# Patient Record
Sex: Male | Born: 1940 | Race: White | Hispanic: No | Smoking: Former smoker
Health system: Southern US, Community
[De-identification: ages and names within clinical notes are randomized; demographics above are authoritative.]

## PROBLEM LIST (undated history)

## (undated) DIAGNOSIS — J189 Pneumonia, unspecified organism: Secondary | ICD-10-CM

## (undated) DIAGNOSIS — I739 Peripheral vascular disease, unspecified: Secondary | ICD-10-CM

## (undated) DIAGNOSIS — H9190 Unspecified hearing loss, unspecified ear: Secondary | ICD-10-CM

## (undated) DIAGNOSIS — E119 Type 2 diabetes mellitus without complications: Secondary | ICD-10-CM

## (undated) DIAGNOSIS — F419 Anxiety disorder, unspecified: Secondary | ICD-10-CM

## (undated) DIAGNOSIS — K759 Inflammatory liver disease, unspecified: Secondary | ICD-10-CM

## (undated) DIAGNOSIS — K219 Gastro-esophageal reflux disease without esophagitis: Secondary | ICD-10-CM

## (undated) DIAGNOSIS — I10 Essential (primary) hypertension: Secondary | ICD-10-CM

## (undated) DIAGNOSIS — D62 Acute posthemorrhagic anemia: Secondary | ICD-10-CM

## (undated) DIAGNOSIS — M1990 Unspecified osteoarthritis, unspecified site: Secondary | ICD-10-CM

## (undated) DIAGNOSIS — F0390 Unspecified dementia without behavioral disturbance: Secondary | ICD-10-CM

## (undated) DIAGNOSIS — I639 Cerebral infarction, unspecified: Secondary | ICD-10-CM

## (undated) HISTORY — PX: SURGERY SCROTAL / TESTICULAR: SUR1316

## (undated) HISTORY — DX: Peripheral vascular disease, unspecified: I73.9

## (undated) HISTORY — PX: APPENDECTOMY: SHX54

## (undated) HISTORY — DX: Unspecified hearing loss, unspecified ear: H91.90

## (undated) HISTORY — PX: FRACTURE SURGERY: SHX138

## (undated) HISTORY — DX: Inflammatory liver disease, unspecified: K75.9

## (undated) HISTORY — DX: Type 2 diabetes mellitus without complications: E11.9

---

## 1997-12-02 ENCOUNTER — Ambulatory Visit (HOSPITAL_COMMUNITY): Admission: RE | Admit: 1997-12-02 | Discharge: 1997-12-02 | Payer: Self-pay | Admitting: Interventional Cardiology

## 2001-05-02 ENCOUNTER — Encounter: Admission: RE | Admit: 2001-05-02 | Discharge: 2001-05-02 | Payer: Self-pay | Admitting: Family Medicine

## 2001-05-02 ENCOUNTER — Encounter: Payer: Self-pay | Admitting: Family Medicine

## 2002-02-12 ENCOUNTER — Encounter: Payer: Self-pay | Admitting: Family Medicine

## 2002-02-12 ENCOUNTER — Encounter: Admission: RE | Admit: 2002-02-12 | Discharge: 2002-02-12 | Payer: Self-pay | Admitting: Family Medicine

## 2002-02-13 ENCOUNTER — Encounter: Admission: RE | Admit: 2002-02-13 | Discharge: 2002-02-13 | Payer: Self-pay | Admitting: Family Medicine

## 2002-02-13 ENCOUNTER — Encounter: Payer: Self-pay | Admitting: Family Medicine

## 2004-10-22 ENCOUNTER — Emergency Department (HOSPITAL_COMMUNITY): Admission: EM | Admit: 2004-10-22 | Discharge: 2004-10-23 | Payer: Self-pay | Admitting: Emergency Medicine

## 2005-07-07 ENCOUNTER — Encounter: Payer: Self-pay | Admitting: Interventional Cardiology

## 2009-01-23 DIAGNOSIS — K759 Inflammatory liver disease, unspecified: Secondary | ICD-10-CM | POA: Insufficient documentation

## 2009-02-10 DIAGNOSIS — R079 Chest pain, unspecified: Secondary | ICD-10-CM | POA: Insufficient documentation

## 2009-02-10 DIAGNOSIS — R209 Unspecified disturbances of skin sensation: Secondary | ICD-10-CM | POA: Insufficient documentation

## 2009-02-10 HISTORY — DX: Chest pain, unspecified: R07.9

## 2010-08-17 DIAGNOSIS — H53139 Sudden visual loss, unspecified eye: Secondary | ICD-10-CM | POA: Insufficient documentation

## 2012-01-14 DIAGNOSIS — E559 Vitamin D deficiency, unspecified: Secondary | ICD-10-CM | POA: Insufficient documentation

## 2012-07-17 DIAGNOSIS — F17200 Nicotine dependence, unspecified, uncomplicated: Secondary | ICD-10-CM | POA: Insufficient documentation

## 2012-08-23 DIAGNOSIS — H905 Unspecified sensorineural hearing loss: Secondary | ICD-10-CM | POA: Insufficient documentation

## 2013-08-17 DIAGNOSIS — K5520 Angiodysplasia of colon without hemorrhage: Secondary | ICD-10-CM | POA: Insufficient documentation

## 2013-08-17 DIAGNOSIS — I739 Peripheral vascular disease, unspecified: Secondary | ICD-10-CM | POA: Insufficient documentation

## 2013-12-03 DIAGNOSIS — Z1211 Encounter for screening for malignant neoplasm of colon: Secondary | ICD-10-CM | POA: Insufficient documentation

## 2013-12-03 HISTORY — DX: Encounter for screening for malignant neoplasm of colon: Z12.11

## 2015-03-28 DIAGNOSIS — I10 Essential (primary) hypertension: Secondary | ICD-10-CM | POA: Insufficient documentation

## 2015-04-08 DIAGNOSIS — I723 Aneurysm of iliac artery: Secondary | ICD-10-CM | POA: Insufficient documentation

## 2015-04-23 ENCOUNTER — Encounter: Payer: Self-pay | Admitting: Vascular Surgery

## 2015-04-24 DIAGNOSIS — G3184 Mild cognitive impairment, so stated: Secondary | ICD-10-CM | POA: Insufficient documentation

## 2015-04-25 ENCOUNTER — Encounter: Payer: Self-pay | Admitting: Vascular Surgery

## 2015-05-06 ENCOUNTER — Ambulatory Visit (INDEPENDENT_AMBULATORY_CARE_PROVIDER_SITE_OTHER): Payer: Medicare Other | Admitting: Vascular Surgery

## 2015-05-06 ENCOUNTER — Encounter: Payer: Self-pay | Admitting: Vascular Surgery

## 2015-05-06 VITALS — BP 150/76 | HR 66 | Temp 97.2°F | Resp 18 | Ht 72.0 in | Wt 222.0 lb

## 2015-05-06 DIAGNOSIS — I723 Aneurysm of iliac artery: Secondary | ICD-10-CM | POA: Diagnosis not present

## 2015-05-06 NOTE — Addendum Note (Signed)
Addended by: Dorthula Rue L on: 05/06/2015 03:13 PM   Modules accepted: Orders

## 2015-05-06 NOTE — Progress Notes (Signed)
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 He is here today with his wife. He has no history of cardiac disease and no history of prior aneurysmal disease. No family history of aneurysms. On the ultrasound on 04/08/2015 was found to have maximal diameter of his aorta 2.4 cm in maximal diameter of right common iliac artery 1.6 and left common iliac artery of 1.5 cm. He has no symptoms referable to his aneurysm.  Past Medical History  Diagnosis Date  . Diabetes mellitus without complication ([place])   . Hepatitis   . Small vessel disease ([place])   . Hearing loss     Family History  Problem Relation Age of Onset  . Diabetes Mother   . Heart disease Father   . Memory loss Paternal Grandfather     SOCIAL HISTORY: Social History   Social History  . Marital Status: Married    Spouse Name: N/A  . Number of Children: N/A  . Years of Education: N/A   Occupational History  . Not on file.   Social History Main Topics  . Smoking status: Current Every Day Smoker    Types: Pipe  . Smokeless tobacco: Never Used  . Alcohol Use: No  . Drug Use: No  . Sexual Activity: Not on file   Other Topics Concern  . Not on file   Social History Narrative    Allergies  Allergen Reactions  . Sulfa Antibiotics     Other reaction(s): Other (See Comments) States made him crazy    Current Outpatient Prescriptions  Medication Sig Dispense Refill  . B Complex Vitamins (VITAMIN-B COMPLEX) TABS Take by mouth.    . Cholecalciferol (VITAMIN D3) 2000 units capsule Take by mouth.    . Cyanocobalamin (VITAMIN B-12) 2500 MCG SUBL Take by mouth.    . escitalopram (LEXAPRO) 20 MG tablet Take 20 mg by mouth.    . rivastigmine  (EXELON) 4.6 mg/24hr Place onto the skin.    . Vitamin E 400 units TABS Take by mouth.    . folic acid (FOLVITE) A999333 MCG tablet Take by mouth. Reported on 05/06/2015     No current facility-administered medications for this visit.    REVIEW OF SYSTEMS:  [X]  denotes positive finding, [ ]  denotes negative finding Cardiac  Comments:  Chest pain or chest pressure:    Shortness of breath upon exertion:    Short of breath when lying flat:    Irregular heart rhythm:        Vascular    Pain in calf, thigh, or hip brought on by ambulation:    Pain in feet at night that wakes you up from your sleep:     Blood clot in your veins:    Leg swelling:         Pulmonary    Oxygen at home:    Productive cough:     Wheezing:         Neurologic    Sudden weakness in arms or legs:     Sudden numbness in arms or legs:     Sudden onset of difficulty speaking or slurred speech:    Temporary loss of vision in one eye:     Problems with dizziness:  Gastrointestinal    Blood in stool:     Vomited blood:         Genitourinary    Burning when urinating:     Blood in urine:        Psychiatric    Major depression:         Hematologic    Bleeding problems:    Problems with blood clotting too easily:        Skin    Rashes or ulcers:        Constitutional    Fever or chills:      PHYSICAL EXAM: Filed Vitals:   05/06/15 1106 05/06/15 1107  BP: 149/77 150/76  Pulse: 66 66  Temp: 97.2 F (36.2 C)   Resp: 18   Height: 6' (1.829 m)   Weight: 222 lb (100.699 kg)   SpO2: 99%     GENERAL: The patient is a well-nourished male, in no acute distress. The vital signs are documented above. CARDIAC: There is a regular rate and rhythm.  VASCULAR: 2+ radial 2+ femoral 2+ popliteal and 2+ dorsalis pedis pulses bilaterally. No evidence of popliteal artery or femoral artery aneurysms by physical exam. PULMONARY: There is good air exchange bilaterally without wheezing or rales. ABDOMEN: Soft  and non-tender with normal pitched bowel sounds. Obese. I do not feel an aneurysm MUSCULOSKELETAL: There are no major deformities or cyanosis. NEUROLOGIC: No focal weakness or paresthesias are detected. SKIN: There are no ulcers or rashes noted. PSYCHIATRIC: The patient has a normal affect.  DATA:  I reviewed his ultrasound from 04/08/2015 and also reviewed his actual images from a CT scan from 2006. This does show measurements as noted above  MEDICAL ISSUES: I discussed this at length with the patient and his wife present. Explained there is no concern regarding his small iliac artery dilatation bilaterally. Bilateral measurements in 2006 his iliac arteries were approximately 1.2 cm. His aorta at that time was approximately 2.1 cm. Explained that this is a minimal change over 11 years. I would recommend repeat ultrasound in 2 years. If this shows no change would drop back to 3 year intervals for surveillance. They understand this & there are no limitations regarding activity. We'll see him again at that time   Matthew Knight, Sherren Mocha [facility] [employer]: [phone]

## 2015-05-06 NOTE — Progress Notes (Signed)
Filed Vitals:   05/06/15 1106 05/06/15 1107  BP: 149/77 150/76  Pulse: 66 66  Temp: 97.2 F (36.2 C)   Resp: 18   Height: 6' (1.829 m)   Weight: 222 lb (100.699 kg)   SpO2: 99%

## 2015-05-08 ENCOUNTER — Encounter: Payer: Self-pay | Admitting: Family Medicine

## 2017-01-22 DIAGNOSIS — R413 Other amnesia: Secondary | ICD-10-CM | POA: Insufficient documentation

## 2017-03-08 ENCOUNTER — Emergency Department (HOSPITAL_COMMUNITY): Payer: Medicare Other

## 2017-03-08 ENCOUNTER — Emergency Department (HOSPITAL_COMMUNITY)
Admission: EM | Admit: 2017-03-08 | Discharge: 2017-03-08 | Disposition: A | Discharge status: Home / Self Care | Payer: Medicare Other | Attending: Emergency Medicine | Admitting: Emergency Medicine

## 2017-03-08 ENCOUNTER — Encounter (HOSPITAL_COMMUNITY): Payer: Self-pay | Admitting: Family Medicine

## 2017-03-08 DIAGNOSIS — W0110XA Fall on same level from slipping, tripping and stumbling with subsequent striking against unspecified object, initial encounter: Secondary | ICD-10-CM | POA: Diagnosis not present

## 2017-03-08 DIAGNOSIS — E119 Type 2 diabetes mellitus without complications: Secondary | ICD-10-CM | POA: Diagnosis not present

## 2017-03-08 DIAGNOSIS — Y929 Unspecified place or not applicable: Secondary | ICD-10-CM | POA: Insufficient documentation

## 2017-03-08 DIAGNOSIS — Y998 Other external cause status: Secondary | ICD-10-CM | POA: Diagnosis not present

## 2017-03-08 DIAGNOSIS — S4991XA Unspecified injury of right shoulder and upper arm, initial encounter: Secondary | ICD-10-CM | POA: Diagnosis present

## 2017-03-08 DIAGNOSIS — F17290 Nicotine dependence, other tobacco product, uncomplicated: Secondary | ICD-10-CM | POA: Diagnosis not present

## 2017-03-08 DIAGNOSIS — Z79899 Other long term (current) drug therapy: Secondary | ICD-10-CM | POA: Diagnosis not present

## 2017-03-08 DIAGNOSIS — S42201A Unspecified fracture of upper end of right humerus, initial encounter for closed fracture: Secondary | ICD-10-CM | POA: Diagnosis not present

## 2017-03-08 DIAGNOSIS — Y93K1 Activity, walking an animal: Secondary | ICD-10-CM | POA: Diagnosis not present

## 2017-03-08 MED ORDER — HYDROCODONE-ACETAMINOPHEN 5-325 MG PO TABS
1.0000 | ORAL_TABLET | Freq: Once | ORAL | Status: AC
Start: 2017-03-08 — End: 2017-03-08
  Administered 2017-03-08: 1 via ORAL
  Filled 2017-03-08: qty 1

## 2017-03-08 MED ORDER — HYDROCODONE-ACETAMINOPHEN 5-325 MG PO TABS: 1.0000 | ORAL_TABLET | ORAL | 0 refills | Status: DC | PRN

## 2017-03-08 MED ORDER — NAPROXEN 375 MG PO TABS: 375.0000 mg | ORAL_TABLET | Freq: Two times a day (BID) | ORAL | 0 refills | Status: DC

## 2017-03-08 MED ORDER — NAPROXEN 500 MG PO TABS
500.0000 mg | ORAL_TABLET | Freq: Once | ORAL | Status: AC
  Administered 2017-03-08: 500 mg via ORAL
  Filled 2017-03-08: qty 1

## 2017-03-08 MED ORDER — DOCUSATE SODIUM 100 MG PO CAPS: 100.0000 mg | ORAL_CAPSULE | Freq: Two times a day (BID) | ORAL | 0 refills | Status: DC

## 2017-03-08 NOTE — ED Provider Notes (Signed)
[facility] DEPT Provider Note   CSN: [id number] Arrival date & time: 03/08/17  1723     History   Chief Complaint Chief Complaint  Patient presents with  . Shoulder Injury  . Fall    HPI Jaquae TSUNEO FAISON is a 77 y.o. male.  HPI Patient was walking his dog when he lost his balance and fell landing on his right shoulder.  He denies he struck his head or  loss of consciousness.  No headache, no neck pain, no chest pain or abdominal pain.  Patient denies hip or lower extremity pain.  Pain is localized to the shoulder where there is deformity patient denies any anticoagulants. Past Medical History:  Diagnosis Date  . Diabetes mellitus without complication ([place])   . Hearing loss   . Hepatitis   . Small vessel disease     There are no active problems to display for this patient.   Past Surgical History:  Procedure Laterality Date  . APPENDECTOMY         Home Medications    Prior to Admission medications   Medication Sig Start Date End Date Taking? Authorizing Provider  B Complex Vitamins (VITAMIN-B COMPLEX) TABS Take by mouth.    [provider]  Cholecalciferol (VITAMIN D3) 2000 units capsule Take by mouth.    [provider]  Cyanocobalamin (VITAMIN B-12) 2500 MCG SUBL Take by mouth.    [provider]  docusate sodium (COLACE) 100 MG capsule Take 1 capsule (100 mg total) by mouth every 12 (twelve) hours. Use to prevent constipation while taking Vicodin. 03/08/17   Charlesetta Shanks, MD  escitalopram (LEXAPRO) 20 MG tablet Take 20 mg by mouth. 03/14/15   [provider]  folic acid (FOLVITE) 628 MCG tablet Take by mouth. Reported on 05/06/2015    [provider]  HYDROcodone-acetaminophen (NORCO/VICODIN) 5-325 MG tablet Take 1-2 tablets by mouth every 4 (four) hours as needed for moderate pain or severe pain. 03/08/17   Charlesetta Shanks, MD  naproxen (NAPROSYN) 375 MG tablet Take 1 tablet (375 mg  total) by mouth 2 (two) times daily. 03/08/17   Charlesetta Shanks, MD  rivastigmine (EXELON) 4.6 mg/24hr Place onto the skin. 10/14/14   [provider]  Vitamin E 400 units TABS Take by mouth. 02/16/13   [provider]    Family History Family History  Problem Relation Age of Onset  . Diabetes Mother   . Heart disease Father   . Memory loss Paternal Grandfather     Social History Social History   Tobacco Use  . Smoking status: Current Every Day Smoker    Types: Pipe  . Smokeless tobacco: Never Used  Substance Use Topics  . Alcohol use: No    Alcohol/week: 0.0 oz  . Drug use: No     Allergies   Sulfa antibiotics   Review of Systems Review of Systems 10 Systems reviewed and are negative for acute change except as noted in the HPI.   Physical Exam Updated Vital Signs BP (!) 184/73 (BP Location: Left Arm)   Pulse (!) 51   Temp 97.8 F (36.6 C) (Oral)   Resp 18   Ht 6' (1.829 m)   Wt 99.8 kg (220 lb)   SpO2 93%   BMI 29.84 kg/m   Physical Exam  Constitutional: He appears well-developed and well-nourished.  HENT:  Head: Normocephalic and atraumatic.  Eyes: Conjunctivae and EOM are normal. Pupils are equal, round, and reactive to light.  Neck: Neck supple.  Cardiovascular: Normal rate and regular rhythm.  No murmur heard. Pulmonary/Chest: Effort normal and breath sounds normal. No respiratory distress. He exhibits no tenderness.  Abdominal: Soft. He exhibits no distension. There is no tenderness.  Musculoskeletal: He exhibits no edema.  Severe pain with any range of motion of the right upper extremity.  Patient is neurovascularly intact.  Swelling over lateral right shoulder. Other extremities normal range of motion without pain or deformity.  Neurological: He is alert. No cranial nerve deficit. He exhibits normal muscle tone. Coordination normal.  Skin: Skin is warm and dry.  Psychiatric: He has a normal mood and affect.  Nursing note and  vitals reviewed.    ED Treatments / Results  Labs (all labs ordered are listed, but only abnormal results are displayed) Labs Reviewed - No data to display  EKG  EKG Interpretation None       Radiology Dg Shoulder Right  Result Date: 03/08/2017 CLINICAL DATA:  Right shoulder pain after fall. EXAM: RIGHT SHOULDER - 2+ VIEW COMPARISON:  None. FINDINGS: An acute, closed surgical neck fracture of the right humerus is noted with 8 mm of medial displacement of the humeral shaft relative to the humeral head. No joint dislocation is seen at the glenohumeral nor AC articulations. Lucencies involving the humeral head undermining the greater tuberosity cannot exclude the possibility a nondisplaced fracture but this is not conclusive based on the two views acquired. There is osteoarthritic spurring and joint space narrowing of the AC joint. IMPRESSION: 1. Acute, closed, 8 mm medially displaced fracture of the surgical neck of the humerus with equivocal fracture involving the humeral head. 2. No joint dislocations. 3. AC joint osteoarthritis. Electronically Signed   By: Ashley Royalty M.D.   On: 03/08/2017 18:16    Procedures Procedures (including critical care time)  Medications Ordered in ED Medications  HYDROcodone-acetaminophen (NORCO/VICODIN) 5-325 MG per tablet 1 tablet (not administered)  naproxen (NAPROSYN) tablet 500 mg (not administered)     Initial Impression / Assessment and Plan / ED Course  I have reviewed the triage vital signs and the nursing notes.  Pertinent labs & imaging results that were available during my care of the patient were reviewed by me and considered in my medical decision making (see chart for details).      Final Clinical Impressions(s) / ED Diagnoses   Final diagnoses:  Closed fracture of proximal end of right humerus, unspecified fracture morphology, initial encounter   Patient on mechanical fall.  He is with family members who are available to assist  at home.  Patient will follow up with orthopedics for humerus fracture.  No other evident injury.  Patient is otherwise at baseline.  Use of naproxen, Vicodin and Colace reviewed with patient and family members. ED Discharge Orders        Ordered    naproxen (NAPROSYN) 375 MG tablet  2 times daily     03/08/17 1929    HYDROcodone-acetaminophen (NORCO/VICODIN) 5-325 MG tablet  Every 4 hours PRN     03/08/17 1929    docusate sodium (COLACE) 100 MG capsule  Every 12 hours     03/08/17 1929       Charlesetta Shanks, MD 03/08/17 1932

## 2017-03-08 NOTE — ED Triage Notes (Signed)
Patient reports he fell in the drive way while attempting to get the dog in the house. He is unsure how he fell but reports he was not dizzy prior to falling. He is complaining of right shoulder pain and denies hitting his head.

## 2017-03-08 NOTE — ED Notes (Signed)
Pt ambulatory and independent at discharge.  Verbalized understanding of discharge instructions 

## 2017-04-19 DIAGNOSIS — B351 Tinea unguium: Secondary | ICD-10-CM | POA: Insufficient documentation

## 2017-05-03 ENCOUNTER — Ambulatory Visit (INDEPENDENT_AMBULATORY_CARE_PROVIDER_SITE_OTHER): Payer: Medicare Other | Admitting: Podiatry

## 2017-05-03 ENCOUNTER — Encounter: Payer: Self-pay | Admitting: Podiatry

## 2017-05-03 DIAGNOSIS — M79675 Pain in left toe(s): Secondary | ICD-10-CM

## 2017-05-03 DIAGNOSIS — B351 Tinea unguium: Secondary | ICD-10-CM

## 2017-05-03 DIAGNOSIS — M79674 Pain in right toe(s): Secondary | ICD-10-CM

## 2017-05-05 NOTE — Progress Notes (Signed)
Subjective:   Patient ID: Matthew Knight, male   DOB: 77 y.o.   MRN: [id number]   HPI 77 year old male presents the office today for concerns of thick, painful, elongated toenails that he could not trim himself.  He previously had a right hallux toenail removed.  He denies any redness or drainage or swelling to the toenail sites.  He denies any recent injury no other swelling or areas of pain to his feet or any other concerns.  He states he is only here because his wife made him come.    Review of Systems  All other systems reviewed and are negative.       Objective:  Physical Exam  General: AAO x3, NAD  Dermatological: Nails are hypertrophic, dystrophic, brittle, discolored, elongated 10. No surrounding redness or drainage. Tenderness nails 1-5 bilaterally. No open lesions or pre-ulcerative lesions are identified today.  Vascular: Dorsalis Pedis artery and Posterior Tibial artery pedal pulses are 2/4 bilateral with immedate capillary fill time. There is no pain with calf compression, swelling, warmth, erythema.   Neruologic: Grossly intact via light touch bilateral. Protective threshold with Semmes Wienstein monofilament intact to all pedal sites bilateral.   Musculoskeletal: No gross boney pedal deformities bilateral. No pain, crepitus, or limitation noted with foot and ankle range of motion bilateral. Muscular strength 5/5 in all groups tested bilateral.  Gait: Unassisted, Nonantalgic.       Assessment:   Symptomatic onychomycosis    Plan:  -Treatment options discussed including all alternatives, risks, and complications -Etiology of symptoms were discussed -Nails debrided 10 without complications or bleeding. -Daily foot inspection -Follow-up in 3 months or sooner if any problems arise. In the meantime, encouraged to call the office with any questions, concerns, change in symptoms.   Celesta Gentile, DPM

## 2017-05-10 ENCOUNTER — Ambulatory Visit: Payer: Medicare Other | Admitting: Vascular Surgery

## 2017-05-10 ENCOUNTER — Encounter (HOSPITAL_COMMUNITY): Payer: Medicare Other

## 2017-07-19 ENCOUNTER — Encounter: Payer: Self-pay | Admitting: Vascular Surgery

## 2017-07-19 ENCOUNTER — Other Ambulatory Visit: Payer: Self-pay

## 2017-07-19 ENCOUNTER — Ambulatory Visit (HOSPITAL_COMMUNITY)
Admission: RE | Admit: 2017-07-19 | Discharge: 2017-07-19 | Disposition: A | Discharge status: Home / Self Care | Payer: Medicare Other | Source: Ambulatory Visit | Attending: Vascular Surgery | Admitting: Vascular Surgery

## 2017-07-19 ENCOUNTER — Ambulatory Visit (INDEPENDENT_AMBULATORY_CARE_PROVIDER_SITE_OTHER): Payer: Medicare Other | Admitting: Vascular Surgery

## 2017-07-19 VITALS — BP 163/75 | HR 135 | Temp 97.2°F | Resp 16 | Ht 72.0 in | Wt 218.0 lb

## 2017-07-19 DIAGNOSIS — I723 Aneurysm of iliac artery: Secondary | ICD-10-CM

## 2017-07-19 NOTE — Progress Notes (Signed)
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  HPI: Matthew Knight is a 77 y.o. male here today for follow-up.  He had had prior imaging studies revealing small iliac artery aneurysms.  He had ultrasound 2 years ago and is here today for serial follow-up.  He is here today with his wife.  He has no new medical problems and specifically denies any symptoms related to his aneurysm  Past Medical History:  Diagnosis Date  . Diabetes mellitus without complication ([place])   . Hearing loss   . Hepatitis   . Small vessel disease ([place])     Family History  Problem Relation Age of Onset  . Diabetes Mother   . Heart disease Father   . Memory loss Paternal Grandfather     SOCIAL HISTORY: Social History   Tobacco Use  . Smoking status: Current Every Day Smoker    Types: Pipe  . Smokeless tobacco: Never Used  Substance Use Topics  . Alcohol use: No    Alcohol/week: 0.0 oz    Allergies  Allergen Reactions  . Sulfa Antibiotics     Other reaction(s): Other (See Comments) States made him crazy    Current Outpatient Medications  Medication Sig Dispense Refill  . B Complex Vitamins (VITAMIN-B COMPLEX) TABS Take by mouth.    . Cholecalciferol (VITAMIN D3) 2000 units capsule Take by mouth.    . Cyanocobalamin (VITAMIN B-12) 2500 MCG SUBL Take by mouth.    . escitalopram (LEXAPRO) 20 MG tablet Take 20 mg by mouth.    . folic acid (FOLVITE) 644 MCG tablet Take by mouth. Reported on 05/06/2015    . naproxen (NAPROSYN) 375 MG tablet Take 1 tablet (375 mg total) by mouth 2 (two) times daily. 20 tablet 0  . rivastigmine (EXELON) 4.6 mg/24hr Place onto the skin.    . Vitamin E 400 units TABS Take by mouth.     No current facility-administered medications for this visit.     REVIEW OF SYSTEMS:    [X]  denotes positive finding, [ ]  denotes negative finding Cardiac  Comments:  Chest pain or chest pressure:    Shortness of breath upon exertion:    Short of breath when lying flat:    Irregular heart rhythm:        Vascular    Pain in calf, thigh, or hip brought on by ambulation:    Pain in feet at night that wakes you up from your sleep:     Blood clot in your veins:    Leg swelling:           PHYSICAL EXAM: Vitals:   07/19/17 1006  BP: (!) 163/75  Pulse: (!) 135  Resp: 16  Temp: (!) 97.2 F (36.2 C)  TempSrc: Oral  SpO2: 97%  Weight: 218 lb (98.9 kg)  Height: 6' (1.829 m)    GENERAL: The patient is a well-nourished male, in no acute distress. The vital signs are documented above. CARDIOVASCULAR: 2+ radial pulses.  He does have moderate obesity and I do not palpate aneurysms. PULMONARY: There is good air exchange  MUSCULOSKELETAL: There are no major deformities or cyanosis. NEUROLOGIC: No focal weakness or paresthesias are detected. SKIN: There are no ulcers or rashes noted. PSYCHIATRIC: The patient has a normal affect.  DATA:  Duplex today reveals maximal diameter of his iliac arteries approximately 1.5  cm bilaterally  MEDICAL ISSUES: I discussed these findings with the patient and his wife.  He does have very small dilatation of his iliac arteries bilaterally.  Have recommended follow-up in 3 years.  If he has no change in his dilatation at that time would discontinue follow-up.    Rosetta Posner, MD FACS [facility] Tel [phone] Pager [phone]

## 2017-08-05 ENCOUNTER — Ambulatory Visit (INDEPENDENT_AMBULATORY_CARE_PROVIDER_SITE_OTHER): Payer: Medicare Other | Admitting: Podiatry

## 2017-08-05 ENCOUNTER — Encounter: Payer: Self-pay | Admitting: Podiatry

## 2017-08-05 ENCOUNTER — Other Ambulatory Visit: Payer: Self-pay

## 2017-08-05 DIAGNOSIS — B351 Tinea unguium: Secondary | ICD-10-CM | POA: Diagnosis not present

## 2017-08-05 DIAGNOSIS — M79675 Pain in left toe(s): Secondary | ICD-10-CM

## 2017-08-05 DIAGNOSIS — M79674 Pain in right toe(s): Secondary | ICD-10-CM

## 2017-08-07 NOTE — Progress Notes (Signed)
HPI Mr. Urwin  presents with his wife today for follow  of thick, painful, elongated toenails that he could not trim himself.  He previously had a right hallux toenail removed and that has healed well.  He denies any redness or drainage or swelling to the toenail sites.  He denies any recent injury no other swelling or areas of pain to his feet or any other concerns.   Wife states he has memory loss and it is being managed.  Review of Systems  All other systems reviewed and are negative.  Physical Exam  General: AAO x3, NAD  Pedal Neurovascular examination unchanged from last visit.  Dermatological: Nails are hypertrophic, painful,  dystrophic, brittle, discolored, elongated 9.  Evidence of recent total nail avulsion right great toe. Nailbed completely epithelialized. No erythema, no edema, no drainage. No open lesions or pre-ulcerative lesions are identified today.  Musculoskeletal: No gross bony pedal deformities bilateral.  Muscular strength 5/5 in all groups tested bilateral.  Gait: Unassisted, Nonantalgic.   Assessment:  Painful onychomycosis of toenails x 9 Recent nail avulsion site right great toe completely healed  Plan: -Nails debrided 9 without complications or iatrogenic bleeding -Daily foot inspection -Follow-up in 3 months or sooner if any problems arise. Wife to call the office should any concerns arise in the interim.

## 2017-08-12 ENCOUNTER — Encounter: Payer: Self-pay | Admitting: Podiatry

## 2017-11-10 ENCOUNTER — Encounter (HOSPITAL_COMMUNITY): Payer: Self-pay | Admitting: *Deleted

## 2017-11-10 ENCOUNTER — Other Ambulatory Visit: Payer: Self-pay

## 2017-11-10 ENCOUNTER — Observation Stay (HOSPITAL_COMMUNITY)
Admission: EM | Admit: 2017-11-10 | Discharge: 2017-11-11 | Disposition: A | Discharge status: Home / Self Care | Payer: Medicare Other | Attending: Family Medicine | Admitting: Family Medicine

## 2017-11-10 ENCOUNTER — Emergency Department (HOSPITAL_COMMUNITY): Payer: Medicare Other

## 2017-11-10 DIAGNOSIS — R55 Syncope and collapse: Secondary | ICD-10-CM | POA: Diagnosis not present

## 2017-11-10 DIAGNOSIS — E119 Type 2 diabetes mellitus without complications: Secondary | ICD-10-CM | POA: Insufficient documentation

## 2017-11-10 DIAGNOSIS — Z79899 Other long term (current) drug therapy: Secondary | ICD-10-CM | POA: Diagnosis not present

## 2017-11-10 DIAGNOSIS — Z9281 Personal history of extracorporeal membrane oxygenation (ECMO): Secondary | ICD-10-CM | POA: Insufficient documentation

## 2017-11-10 DIAGNOSIS — R001 Bradycardia, unspecified: Secondary | ICD-10-CM | POA: Insufficient documentation

## 2017-11-10 DIAGNOSIS — I10 Essential (primary) hypertension: Secondary | ICD-10-CM | POA: Diagnosis not present

## 2017-11-10 DIAGNOSIS — H409 Unspecified glaucoma: Secondary | ICD-10-CM | POA: Insufficient documentation

## 2017-11-10 DIAGNOSIS — F17200 Nicotine dependence, unspecified, uncomplicated: Secondary | ICD-10-CM | POA: Insufficient documentation

## 2017-11-10 DIAGNOSIS — Z7982 Long term (current) use of aspirin: Secondary | ICD-10-CM | POA: Diagnosis not present

## 2017-11-10 DIAGNOSIS — R2681 Unsteadiness on feet: Secondary | ICD-10-CM | POA: Insufficient documentation

## 2017-11-10 DIAGNOSIS — F0390 Unspecified dementia without behavioral disturbance: Secondary | ICD-10-CM | POA: Insufficient documentation

## 2017-11-10 LAB — CBC WITH DIFFERENTIAL/PLATELET
ABS IMMATURE GRANULOCYTES: 0.1 10*3/uL (ref 0.0–0.1)
Basophils Absolute: 0.1 10*3/uL (ref 0.0–0.1)
Basophils Relative: 1 %
EOS PCT: 1 %
Eosinophils Absolute: 0.2 10*3/uL (ref 0.0–0.7)
HCT: 50.8 % (ref 39.0–52.0)
HEMOGLOBIN: 16.7 g/dL (ref 13.0–17.0)
Immature Granulocytes: 1 %
LYMPHS ABS: 1.9 10*3/uL (ref 0.7–4.0)
LYMPHS PCT: 14 %
MCH: 29.9 pg (ref 26.0–34.0)
MCHC: 32.9 g/dL (ref 30.0–36.0)
MCV: 91 fL (ref 78.0–100.0)
MONO ABS: 0.7 10*3/uL (ref 0.1–1.0)
MONOS PCT: 5 %
NEUTROS ABS: 11 10*3/uL — AB (ref 1.7–7.7)
Neutrophils Relative %: 78 %
Platelets: 336 10*3/uL (ref 150–400)
RBC: 5.58 MIL/uL (ref 4.22–5.81)
RDW: 12.3 % (ref 11.5–15.5)
WBC: 13.9 10*3/uL — ABNORMAL HIGH (ref 4.0–10.5)

## 2017-11-10 LAB — URINALYSIS, ROUTINE W REFLEX MICROSCOPIC
Bilirubin Urine: NEGATIVE
GLUCOSE, UA: NEGATIVE mg/dL
Hgb urine dipstick: NEGATIVE
Ketones, ur: NEGATIVE mg/dL
Leukocytes, UA: NEGATIVE
NITRITE: NEGATIVE
PROTEIN: NEGATIVE mg/dL
SPECIFIC GRAVITY, URINE: 1.023 (ref 1.005–1.030)
pH: 7 (ref 5.0–8.0)

## 2017-11-10 LAB — COMPREHENSIVE METABOLIC PANEL
ALK PHOS: 73 U/L (ref 38–126)
ALT: 36 U/L (ref 0–44)
ANION GAP: 11 (ref 5–15)
AST: 41 U/L (ref 15–41)
Albumin: 3.8 g/dL (ref 3.5–5.0)
BUN: 16 mg/dL (ref 8–23)
CALCIUM: 9.1 mg/dL (ref 8.9–10.3)
CO2: 25 mmol/L (ref 22–32)
CREATININE: 1.09 mg/dL (ref 0.61–1.24)
Chloride: 106 mmol/L (ref 98–111)
GFR calc non Af Amer: >60 mL/min (ref >60)
Glucose, Bld: 110 mg/dL — ABNORMAL HIGH (ref 70–99)
Potassium: 4.4 mmol/L (ref 3.5–5.1)
SODIUM: 142 mmol/L (ref 135–145)
TOTAL PROTEIN: 6.5 g/dL (ref 6.5–8.1)
Total Bilirubin: 1 mg/dL (ref 0.3–1.2)

## 2017-11-10 LAB — TROPONIN I: Troponin I: <0.03 ng/mL (ref <0.03)

## 2017-11-10 MED ORDER — ACETAMINOPHEN 325 MG PO TABS: 650.0000 mg | ORAL_TABLET | Freq: Four times a day (QID) | ORAL | Status: DC | PRN

## 2017-11-10 MED ORDER — INSULIN ASPART 100 UNIT/ML ~~LOC~~ SOLN: 0.0000 [IU] | Freq: Three times a day (TID) | SUBCUTANEOUS | Status: DC

## 2017-11-10 MED ORDER — SODIUM CHLORIDE 0.9 % IV SOLN
INTRAVENOUS | Status: AC
  Administered 2017-11-11: 01:00:00 via INTRAVENOUS

## 2017-11-10 MED ORDER — INSULIN ASPART 100 UNIT/ML ~~LOC~~ SOLN: 0.0000 [IU] | Freq: Every day | SUBCUTANEOUS | Status: DC

## 2017-11-10 MED ORDER — ENOXAPARIN SODIUM 40 MG/0.4ML ~~LOC~~ SOLN: 40.0000 mg | SUBCUTANEOUS | Status: DC

## 2017-11-10 MED ORDER — ACETAMINOPHEN 650 MG RE SUPP: 650.0000 mg | Freq: Four times a day (QID) | RECTAL | Status: DC | PRN

## 2017-11-10 NOTE — ED Provider Notes (Signed)
Patient care was taken over from Dr. Lita Mains.  He is a 77 year old who had a syncopal episode.  He has a history of dementia but is at his baseline mental status currently.  He has no focal neurologic deficits.  He was noted to be bradycardic and is still bradycardic in the 50s.  He was initially in the 2s.  He is maintaining normal blood pressures.  His imaging studies do not show any evidence of facial fractures.  There was some questionable abnormalities in his thalamus.  Radiologist was requesting MRI.  MRI was performed which showed no evidence of acute abnormalities.  Given his ongoing bradycardia, I consulted with Dr. Maudie Mercury who will admit the patient for observation.   Matthew Johns, MD 11/10/17 [phone]

## 2017-11-10 NOTE — ED Notes (Signed)
Had moderate vomit x1.

## 2017-11-10 NOTE — ED Provider Notes (Signed)
[facility] Provider Note   CSN: [id number] Arrival date & time: 11/10/17  1333     History   Chief Complaint Chief Complaint  Patient presents with  . Fall    HPI Matthew Knight is a 76 y.o. male.  HPI Patient is a poor historian.  Has early onset dementia.  Per wife patient was looking unsteady while he was walking at lunch today.  She took a trip out of his hands and saw him fall face forward and hit the floor.  Had a loss of consciousness of roughly 1 minute.  No seizure-like activity.  Noted to be bleeding from his mouth nose.  Patient does not remember the fall.  Currently denying headache or neck pain.  No focal weakness or numbness.  No recent vomiting or diarrhea. Past Medical History:  Diagnosis Date  . Diabetes mellitus without complication ([place])   . Hearing loss   . Hepatitis   . Small vessel disease [facility])     Patient Active Problem List   Diagnosis Date Noted  . Hypertension 11/11/2017  . Bradycardia 11/11/2017  . Syncope 11/10/2017    Past Surgical History:  Procedure Laterality Date  . APPENDECTOMY          Home Medications    Prior to Admission medications   Medication Sig Start Date End Date Taking? Authorizing Provider  aspirin EC 81 MG tablet Take 81 mg by mouth daily.    Yes [provider]  B Complex Vitamins (VITAMIN-B COMPLEX) TABS Take 1 tablet by mouth daily.    Yes [provider]  baclofen (LIORESAL) 10 MG tablet Take 10 mg by mouth daily.  03/17/17  Yes [provider]  Cholecalciferol (VITAMIN D3) 2000 units capsule Take 2,000 Units by mouth daily.    Yes [provider]  Cyanocobalamin (VITAMIN B-12) 2500 MCG SUBL Take 2,500 mcg by mouth daily.    Yes [provider]  escitalopram (LEXAPRO) 20 MG tablet Take 20 mg by mouth daily.  03/14/15  Yes [provider]  folic acid (FOLVITE) 106 MCG tablet Take 400 mcg by mouth daily. Reported on  05/06/2015   Yes [provider]  latanoprost (XALATAN) 0.005 % ophthalmic solution Place 1 drop into both eyes at bedtime.  07/27/17  Yes [provider]  lisinopril (PRINIVIL,ZESTRIL) 20 MG tablet Take 20 mg by mouth daily.  10/21/16  Yes [provider]  naproxen (NAPROSYN) 375 MG tablet Take 1 tablet (375 mg total) by mouth 2 (two) times daily. 03/08/17  Yes Charlesetta Shanks, MD  rivastigmine (EXELON) 4.6 mg/24hr Place 4.6 mg onto the skin daily.  10/14/14  Yes [provider]  timolol (TIMOPTIC) 0.5 % ophthalmic solution Place 1 drop into both eyes daily.  06/14/16  Yes [provider]  Vitamin E 400 units TABS Take 400 Units by mouth daily.  02/16/13  Yes [provider]    Family History Family History  Problem Relation Age of Onset  . Diabetes Mother   . Heart disease Father   . Memory loss Paternal Grandfather     Social History Social History   Tobacco Use  . Smoking status: Current Every Day Smoker    Types: Pipe  . Smokeless tobacco: Never Used  Substance Use Topics  . Alcohol use: No    Alcohol/week: 0.0 standard drinks  . Drug use: No     Allergies   Sulfa antibiotics   Review of Systems Review  of Systems  Unable to perform ROS: Dementia     Physical Exam Updated Vital Signs BP 133/63 (BP Location: Right Arm)   Pulse 63   Temp 98.3 F (36.8 C) (Oral)   Resp 20   Ht 6' (1.829 m)   Wt 98.9 kg Comment: from May 2019 records  SpO2 93%   BMI 29.57 kg/m   Physical Exam  Constitutional: He is oriented to person, place, and time. He appears well-developed and well-nourished. No distress.  HENT:  Head: Normocephalic.  Mouth/Throat: Oropharynx is clear and moist.  Blood in the left nare.  Patient has small less than 1 cm laceration to the mucosal surface of the left upper lip.  No trauma to the tongue.  Midface is stable.  No malocclusion.  Eyes: Pupils are equal, round, and reactive to light. EOM are normal.    Neck: Normal range of motion. Neck supple.  No posterior midline cervical tenderness to palpation.  Cardiovascular: Regular rhythm.  Bradycardia.  Pulmonary/Chest: Effort normal and breath sounds normal. No stridor. No respiratory distress. He has no wheezes. He has no rales. He exhibits no tenderness.  Abdominal: Soft. Bowel sounds are normal. There is no tenderness. There is no rebound and no guarding.  Musculoskeletal: Normal range of motion. He exhibits no edema or tenderness.  No midline thoracic or lumbar tenderness.  No lower extremity swelling, asymmetry or tenderness.  Distal pulses intact.  Pelvis is stable.  Neurological: He is alert and oriented to person, place, and time.  Very hard of hearing.  Mildly repetitive with questioning.  5/5 motor all extremities.  Sensation fully intact.  Skin: Skin is warm and dry. Capillary refill takes less than 2 seconds. No rash noted. He is not diaphoretic. No erythema.  Psychiatric: He has a normal mood and affect. His behavior is normal.  Nursing note and vitals reviewed.    ED Treatments / Results  Labs (all labs ordered are listed, but only abnormal results are displayed) Labs Reviewed  CBC WITH DIFFERENTIAL/PLATELET - Abnormal; Notable for the following components:      Result Value   WBC 13.9 (*)    Neutro Abs 11.0 (*)    All other components within normal limits  COMPREHENSIVE METABOLIC PANEL - Abnormal; Notable for the following components:   Glucose, Bld 110 (*)    All other components within normal limits  COMPREHENSIVE METABOLIC PANEL - Abnormal; Notable for the following components:   Glucose, Bld 114 (*)    Total Protein 6.3 (*)    All other components within normal limits  CBC - Abnormal; Notable for the following components:   WBC 15.3 (*)    All other components within normal limits  GLUCOSE, CAPILLARY - Abnormal; Notable for the following components:   Glucose-Capillary 111 (*)    All other components within  normal limits  TROPONIN I  URINALYSIS, ROUTINE W REFLEX MICROSCOPIC  TROPONIN I  TROPONIN I  GLUCOSE, CAPILLARY  TROPONIN I    EKG EKG Interpretation  Date/Time:  Thursday November 10 2017 13:41:39 EDT Ventricular Rate:  48 PR Interval:    QRS Duration: 115 QT Interval:  472 QTC Calculation: 422 R Axis:   89 Text Interpretation:  Sinus bradycardia Nonspecific intraventricular conduction delay No old tracing to compare Confirmed by Malvin Johns [phone]) on 11/10/2017 3:33:28 PM   Radiology Ct Head Wo Contrast  Result Date: 11/10/2017 CLINICAL DATA:  Status post fall with blunt maxillofacial trauma. EXAM: CT HEAD WITHOUT CONTRAST CT MAXILLOFACIAL  WITHOUT CONTRAST CT CERVICAL SPINE WITHOUT CONTRAST TECHNIQUE: Multidetector CT imaging of the head, cervical spine, and maxillofacial structures were performed using the standard protocol without intravenous contrast. Multiplanar CT image reconstructions of the cervical spine and maxillofacial structures were also generated. COMPARISON:  None. FINDINGS: CT HEAD FINDINGS Brain: There is chronic diffuse atrophy. Chronic bilateral periventricular white matter small vessel ischemic changes identified. In the anterior right thalamus, posterior left thalamus, there is increased density, hemorrhage is not excluded. There is no midline shift or hydrocephalus. No acute transcortical infarct is identified. Vascular: No hyperdense vessel or unexpected calcification. Skull: See maxillofacial CT for further report. The calvarium is intact. Other: None. CT MAXILLOFACIAL FINDINGS Osseous: Prior postsurgical change of the left anterior maxillary wall with mucoperiosteal thickening of the left maxillary sinus are noted. Chronic deformity of the nasal bones are noted. There is no definite acute displaced fracture or dislocation. Orbits: Negative. No traumatic or inflammatory finding. Sinuses: Chronic postsurgical change of the anterior left maxillary sinus with  mucoperiosteal thickening and a calcified cyst in the left maxillary sinus are identified. Soft tissues: Minimal subcutaneous fat and skin swelling over the posterosuperior right anterior skull at the level of the superior orbit. CT CERVICAL SPINE FINDINGS Alignment: There is straightening of cervical spine. Skull base and vertebrae: No acute fracture. No primary bone lesion or focal pathologic process. Soft tissues and spinal canal: No prevertebral fluid or swelling. No visible canal hematoma. Disc levels: There are degenerative joint changes throughout the cervical spine with narrowed joint space and osteophyte formation. Upper chest: Negative. Other: None. IMPRESSION: Small areas of increased density in the anterior right thalamus and in the posterior left thalamus, hemorrhage is not excluded. Consider further evaluation with MRI of brain. Minimal subcutaneous fat and skin swelling over the posterosuperior right anterior skull at the level the superior orbit. No acute fracture or dislocation of maxillofacial bones or cervical spine. Degenerative joint changes of cervical spine. These results will be called to the ordering clinician or representative by the Radiologist Assistant, and communication documented in the PACS or zVision Dashboard. Electronically Signed   By: Abelardo Diesel M.D.   On: 11/10/2017 15:53   Ct Cervical Spine Wo Contrast  Result Date: 11/10/2017 CLINICAL DATA:  Status post fall with blunt maxillofacial trauma. EXAM: CT HEAD WITHOUT CONTRAST CT MAXILLOFACIAL WITHOUT CONTRAST CT CERVICAL SPINE WITHOUT CONTRAST TECHNIQUE: Multidetector CT imaging of the head, cervical spine, and maxillofacial structures were performed using the standard protocol without intravenous contrast. Multiplanar CT image reconstructions of the cervical spine and maxillofacial structures were also generated. COMPARISON:  None. FINDINGS: CT HEAD FINDINGS Brain: There is chronic diffuse atrophy. Chronic bilateral  periventricular white matter small vessel ischemic changes identified. In the anterior right thalamus, posterior left thalamus, there is increased density, hemorrhage is not excluded. There is no midline shift or hydrocephalus. No acute transcortical infarct is identified. Vascular: No hyperdense vessel or unexpected calcification. Skull: See maxillofacial CT for further report. The calvarium is intact. Other: None. CT MAXILLOFACIAL FINDINGS Osseous: Prior postsurgical change of the left anterior maxillary wall with mucoperiosteal thickening of the left maxillary sinus are noted. Chronic deformity of the nasal bones are noted. There is no definite acute displaced fracture or dislocation. Orbits: Negative. No traumatic or inflammatory finding. Sinuses: Chronic postsurgical change of the anterior left maxillary sinus with mucoperiosteal thickening and a calcified cyst in the left maxillary sinus are identified. Soft tissues: Minimal subcutaneous fat and skin swelling over the posterosuperior right anterior skull at  the level of the superior orbit. CT CERVICAL SPINE FINDINGS Alignment: There is straightening of cervical spine. Skull base and vertebrae: No acute fracture. No primary bone lesion or focal pathologic process. Soft tissues and spinal canal: No prevertebral fluid or swelling. No visible canal hematoma. Disc levels: There are degenerative joint changes throughout the cervical spine with narrowed joint space and osteophyte formation. Upper chest: Negative. Other: None. IMPRESSION: Small areas of increased density in the anterior right thalamus and in the posterior left thalamus, hemorrhage is not excluded. Consider further evaluation with MRI of brain. Minimal subcutaneous fat and skin swelling over the posterosuperior right anterior skull at the level the superior orbit. No acute fracture or dislocation of maxillofacial bones or cervical spine. Degenerative joint changes of cervical spine. These results will  be called to the ordering clinician or representative by the Radiologist Assistant, and communication documented in the PACS or zVision Dashboard. Electronically Signed   By: Abelardo Diesel M.D.   On: 11/10/2017 15:53   Mr Brain Wo Contrast  Result Date: 11/10/2017 CLINICAL DATA:  Fall.  Hyperdensity in the thalami on CT. EXAM: MRI HEAD WITHOUT CONTRAST TECHNIQUE: Multiplanar, multiecho pulse sequences of the brain and surrounding structures were obtained without intravenous contrast. COMPARISON:  Head CT 11/10/2017 and MRI 08/03/2017 FINDINGS: Brain: There is no evidence of acute infarct, intracranial hemorrhage, mass, midline shift, or extra-axial fluid collection. There is moderate cerebral atrophy. Small foci of cerebral white matter T2 hyperintensity are unchanged from the prior MRI and nonspecific but compatible with mild chronic small vessel ischemic disease. There is susceptibility artifact and mild T1 shortening in the dorsal left thalamus with a smaller focus in the ventral right thalamus corresponding to the increased density on CT. The signal changes are stable from the prior MRI and consistent with calcification rather than hemorrhage. 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 Vascular: Major intracranial vascular flow voids are preserved. Skull and upper cervical spine: Unremarkable bone marrow signal. Sinuses/Orbits: Unremarkable orbits. Chronic left maxillary sinusitis. Trace bilateral mastoid effusions. Other: None. IMPRESSION: 1. No acute intracranial abnormality. 2. 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 No evidence of hemorrhage. Electronically Signed   By: Logan Bores M.D.   On: 11/10/2017 21:31   Ct Maxillofacial Wo  Contrast  Result Date: 11/10/2017 CLINICAL DATA:  Status post fall with blunt maxillofacial trauma. EXAM: CT HEAD WITHOUT CONTRAST CT MAXILLOFACIAL WITHOUT CONTRAST CT CERVICAL SPINE WITHOUT CONTRAST TECHNIQUE: Multidetector CT imaging of the head, cervical spine, and maxillofacial structures were performed using the standard protocol without intravenous contrast. Multiplanar CT image reconstructions of the cervical spine and maxillofacial structures were also generated. COMPARISON:  None. FINDINGS: CT HEAD FINDINGS Brain: There is chronic diffuse atrophy. Chronic bilateral periventricular white matter small vessel ischemic changes identified. In the anterior right thalamus, posterior left thalamus, there is increased density, hemorrhage is not excluded. There is no midline shift or hydrocephalus. No acute transcortical infarct is identified. Vascular: No hyperdense vessel or unexpected calcification. Skull: See maxillofacial CT for further report. The calvarium is intact. Other: None. CT MAXILLOFACIAL FINDINGS Osseous: Prior postsurgical change of the left anterior maxillary wall with mucoperiosteal thickening of the left maxillary sinus are noted. Chronic deformity of the nasal bones are noted. There is no definite acute displaced fracture or dislocation. Orbits: Negative.  No traumatic or inflammatory finding. Sinuses: Chronic postsurgical change of the anterior left maxillary sinus with mucoperiosteal thickening and a calcified cyst in the left maxillary sinus are identified. Soft tissues: Minimal subcutaneous fat and skin swelling over the posterosuperior right anterior skull at the level of the superior orbit. CT CERVICAL SPINE FINDINGS Alignment: There is straightening of cervical spine. Skull base and vertebrae: No acute fracture. No primary bone lesion or focal pathologic process. Soft tissues and spinal canal: No prevertebral fluid or swelling. No visible canal hematoma. Disc levels: There are degenerative  joint changes throughout the cervical spine with narrowed joint space and osteophyte formation. Upper chest: Negative. Other: None. IMPRESSION: Small areas of increased density in the anterior right thalamus and in the posterior left thalamus, hemorrhage is not excluded. Consider further evaluation with MRI of brain. Minimal subcutaneous fat and skin swelling over the posterosuperior right anterior skull at the level the superior orbit. No acute fracture or dislocation of maxillofacial bones or cervical spine. Degenerative joint changes of cervical spine. These results will be called to the ordering clinician or representative by the Radiologist Assistant, and communication documented in the PACS or zVision Dashboard. Electronically Signed   By: Abelardo Diesel M.D.   On: 11/10/2017 15:53    Procedures Procedures (including critical care time)  Medications Ordered in ED Medications  aspirin EC tablet 81 mg (has no administration in time range)  lisinopril (PRINIVIL,ZESTRIL) tablet 20 mg (has no administration in time range)  escitalopram (LEXAPRO) tablet 20 mg (has no administration in time range)  rivastigmine (EXELON) 4.6 mg/24hr 4.6 mg (has no administration in time range)  vitamin B-12 (CYANOCOBALAMIN) tablet 2,500 mcg (has no administration in time range)  folic acid (FOLVITE) tablet 0.5 mg (has no administration in time range)  baclofen (LIORESAL) tablet 10 mg (has no administration in time range)  B-complex with vitamin C tablet 1 tablet (has no administration in time range)  cholecalciferol (VITAMIN D) tablet 2,000 Units (has no administration in time range)  vitamin E capsule 400 Units (has no administration in time range)  latanoprost (XALATAN) 0.005 % ophthalmic solution 1 drop (1 drop Both Eyes Given 11/11/17 0158)  enoxaparin (LOVENOX) injection 40 mg (has no administration in time range)  0.9 %  sodium chloride infusion ( Intravenous New Bag/Given 11/11/17 0102)  acetaminophen (TYLENOL)  tablet 650 mg (has no administration in time range)    Or  acetaminophen (TYLENOL) suppository 650 mg (has no administration in time range)  ondansetron (ZOFRAN) injection 4 mg (4 mg Intravenous Given 11/11/17 0105)  ondansetron (ZOFRAN) 4 MG/2ML injection (has no administration in time range)     Initial Impression / Assessment and Plan / ED Course  I have reviewed the triage vital signs and the nursing notes.  Pertinent labs & imaging results that were available during my care of the patient were reviewed by me and considered in my medical decision making (see chart for details).     Questionable syncope and collapse versus trip and fall with closed head injury and syncope.  Mildly repetitive at this time otherwise neurologically stable.  Signed out to oncoming emergency provider pending CT evaluation and laboratory testing.  Likely will need to be admitted for observation.  Final Clinical Impressions(s) / ED Diagnoses   Final diagnoses:  Syncope, unspecified syncope type    ED Discharge Orders    None       Julianne Rice, MD 11/11/17 [phone]

## 2017-11-10 NOTE — H&P (Signed)
TRH H&P   Patient Demographics:    Matthew Knight, is a 77 y.o. male  MRN: [id number]   DOB - Jun 03, 1940  Admit Date - 11/10/2017  Outpatient Primary MD for the patient is [name], MD  Referring MD/NP/PA:   Pamala Duffel  Outpatient Specialists:      Patient coming from:   home  Chief Complaint  Patient presents with  . Fall      HPI:    Matthew Knight  is a 77 y.o. male, w? Dementia, Glaucoma who presents with c/o syncope while at home. He apparently fell forward hitting his face.  This was apparently witnessed by wife LOC for  <1 minute,  No seizure activity,  Pt was bleeding from mouth and nose and brought to ER   In Ed,  T afebrile P 48-66  Bp 160/79  Pox 91%   CT brain IMPRESSION: Small areas of increased density in the anterior right thalamus and in the posterior left thalamus, hemorrhage is not excluded. Consider further evaluation with MRI of brain.  Minimal subcutaneous fat and skin swelling over the posterosuperior right anterior skull at the level the superior orbit.  No acute fracture or dislocation of maxillofacial bones or cervical spine.  Degenerative joint changes of cervical spine.  MRI Brain IMPRESSION: 1. No acute intracranial abnormality. 2. 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 No evidence of hemorrhage.  CXR pending  Wbc 13.9, Hgb 16.7, Plt 336 Na 142, K 4.4, Bun 16, Creatinine 1.09 Ast 41, Alt 36  Trop <0.03  Urinalysis negative  EKG  nsr at 48, nl axis,   Pt will be admitted observation for syncope as well as bradycardia.  Have consulted cardiology regarding bradycardia.      Review of systems:    In addition to the HPI above, No Fever-chills, No Headache,  No changes with Vision or hearing, No problems swallowing food or Liquids, No Chest pain, Cough or Shortness of Breath, No Abdominal pain, No Nausea or Vommitting, Bowel movements are regular, No Blood in stool or Urine, No dysuria, No new skin rashes or bruises, No new joints pains-aches,  No new weakness, tingling, numbness in any extremity, No recent weight gain or loss, No polyuria, polydypsia or polyphagia, No significant Mental Stressors.  A full 10 point Review of Systems was done, except as stated above, all other Review of Systems were negative.   With Past History of the following :    Past Medical History:  Diagnosis Date  . Diabetes mellitus without complication ([place])   . Hearing loss   . Hepatitis   . Small vessel disease [facility])       Past Surgical History:  Procedure Laterality Date  . APPENDECTOMY        Social  History:     Social History   Tobacco Use  . Smoking status: Current Every Day Smoker    Types: Pipe  . Smokeless tobacco: Never Used  Substance Use Topics  . Alcohol use: No    Alcohol/week: 0.0 standard drinks     Lives - at home  Mobility - walks by self   Family History :     Family History  Problem Relation Age of Onset  . Diabetes Mother   . Heart disease Father   . Memory loss Paternal Grandfather        Home Medications:   Prior to Admission medications   Medication Sig Start Date End Date Taking? Authorizing Provider  aspirin EC 81 MG tablet Take 81 mg by mouth daily.    Yes [provider]  B Complex Vitamins (VITAMIN-B COMPLEX) TABS Take 1 tablet by mouth daily.    Yes [provider]  baclofen (LIORESAL) 10 MG tablet Take 10 mg by mouth daily.  03/17/17  Yes [provider]  Cholecalciferol (VITAMIN D3) 2000 units capsule Take 2,000 Units by mouth daily.    Yes [provider]  Cyanocobalamin (VITAMIN B-12) 2500 MCG SUBL Take 2,500 mcg by mouth daily.    Yes [provider]  escitalopram (LEXAPRO) 20 MG tablet Take 20 mg by mouth daily.  03/14/15  Yes [provider]  folic acid (FOLVITE) 161 MCG tablet Take 400 mcg by mouth daily. Reported on 05/06/2015   Yes [provider]  latanoprost (XALATAN) 0.005 % ophthalmic solution Place 1 drop into both eyes at bedtime.  07/27/17  Yes [provider]  lisinopril (PRINIVIL,ZESTRIL) 20 MG tablet Take 20 mg by mouth daily.  10/21/16  Yes [provider]  naproxen (NAPROSYN) 375 MG tablet Take 1 tablet (375 mg total) by mouth 2 (two) times daily. 03/08/17  Yes Charlesetta Shanks, MD  rivastigmine (EXELON) 4.6 mg/24hr Place 4.6 mg onto the skin daily.  10/14/14  Yes [provider]  timolol (TIMOPTIC) 0.5 % ophthalmic solution Place 1 drop into both eyes daily.  06/14/16  Yes [provider]  Vitamin E 400 units TABS Take 400 Units by mouth daily.  02/16/13  Yes [provider]     Allergies:     Allergies  Allergen Reactions  . Sulfa Antibiotics     Other reaction(s): Other (See Comments) States made him crazy     Physical Exam:   Vitals  Blood pressure (!) 156/72, pulse (!) 53, resp. rate 18, SpO2 96 %.   1. General  lying in bed in NAD,    2. Normal affect and insight, Not Suicidal or Homicidal, Awake Alert, Oriented X 2  3. No F.N deficits, ALL C.Nerves Intact, Strength 5/5 all 4 extremities, Sensation intact all 4 extremities, Plantars down going.  4. Ears and Eyes appear Normal, Conjunctivae clear, PERRLA. Moist Oral Mucosa.  5. Supple Neck, No JVD, No cervical lymphadenopathy appriciated, No Carotid Bruits.  6. Symmetrical Chest wall movement, Good air movement bilaterally, CTAB.  7. Bradycardic s1, s2,   8. Positive Bowel Sounds, Abdomen Soft, No tenderness, No organomegaly appriciated,No rebound -guarding or rigidity.  9.  No Cyanosis, Normal Skin Turgor, No Skin Rash or Bruise.  10. Good muscle tone,  joints appear normal , no effusions,  Normal ROM.  11. No Palpable Lymph Nodes in Neck or Axillae     Data Review:    CBC Recent Labs  Lab 11/10/17 1545  WBC  13.9*  HGB 16.7  HCT 50.8  PLT 336  MCV 91.0  MCH 29.9  MCHC 32.9  RDW 12.3  LYMPHSABS 1.9  MONOABS 0.7  EOSABS 0.2  BASOSABS 0.1   ------------------------------------------------------------------------------------------------------------------  Chemistries  Recent Labs  Lab 11/10/17 1545  NA 142  K 4.4  CL 106  CO2 25  GLUCOSE 110*  BUN 16  CREATININE 1.09  CALCIUM 9.1  AST 41  ALT 36  ALKPHOS 73  BILITOT 1.0   ------------------------------------------------------------------------------------------------------------------ CrCl cannot be calculated (Unknown ideal weight.). ------------------------------------------------------------------------------------------------------------------ No results for input(s): TSH, T4TOTAL, T3FREE, THYROIDAB in the last 72 hours.  Invalid input(s): FREET3  Coagulation profile No results for input(s): INR, PROTIME in the last 168 hours. ------------------------------------------------------------------------------------------------------------------- No results for input(s): DDIMER in the last 72 hours. -------------------------------------------------------------------------------------------------------------------  Cardiac Enzymes Recent Labs  Lab 11/10/17 1545  TROPONINI <0.03   ------------------------------------------------------------------------------------------------------------------ No results found for: BNP   ---------------------------------------------------------------------------------------------------------------  Urinalysis    Component Value Date/Time   COLORURINE YELLOW 11/10/2017 1848   APPEARANCEUR CLEAR 11/10/2017 1848   LABSPEC 1.023 11/10/2017 1848   PHURINE 7.0 11/10/2017 1848   GLUCOSEU NEGATIVE 11/10/2017 1848   HGBUR NEGATIVE 11/10/2017 1848    BILIRUBINUR NEGATIVE 11/10/2017 1848   KETONESUR NEGATIVE 11/10/2017 1848   PROTEINUR NEGATIVE 11/10/2017 1848   NITRITE NEGATIVE 11/10/2017 1848   LEUKOCYTESUR NEGATIVE 11/10/2017 1848    ----------------------------------------------------------------------------------------------------------------   Imaging Results:    Ct Head Wo Contrast  Result Date: 11/10/2017 CLINICAL DATA:  Status post fall with blunt maxillofacial trauma. EXAM: CT HEAD WITHOUT CONTRAST CT MAXILLOFACIAL WITHOUT CONTRAST CT CERVICAL SPINE WITHOUT CONTRAST TECHNIQUE: Multidetector CT imaging of the head, cervical spine, and maxillofacial structures were performed using the standard protocol without intravenous contrast. Multiplanar CT image reconstructions of the cervical spine and maxillofacial structures were also generated. COMPARISON:  None. FINDINGS: CT HEAD FINDINGS Brain: There is chronic diffuse atrophy. Chronic bilateral periventricular white matter small vessel ischemic changes identified. In the anterior right thalamus, posterior left thalamus, there is increased density, hemorrhage is not excluded. There is no midline shift or hydrocephalus. No acute transcortical infarct is identified. Vascular: No hyperdense vessel or unexpected calcification. Skull: See maxillofacial CT for further report. The calvarium is intact. Other: None. CT MAXILLOFACIAL FINDINGS Osseous: Prior postsurgical change of the left anterior maxillary wall with mucoperiosteal thickening of the left maxillary sinus are noted. Chronic deformity of the nasal bones are noted. There is no definite acute displaced fracture or dislocation. Orbits: Negative. No traumatic or inflammatory finding. Sinuses: Chronic postsurgical change of the anterior left maxillary sinus with mucoperiosteal thickening and a calcified cyst in the left maxillary sinus are identified. Soft tissues: Minimal subcutaneous fat and skin swelling over the posterosuperior right anterior  skull at the level of the superior orbit. CT CERVICAL SPINE FINDINGS Alignment: There is straightening of cervical spine. Skull base and vertebrae: No acute fracture. No primary bone lesion or focal pathologic process. Soft tissues and spinal canal: No prevertebral fluid or swelling. No visible canal hematoma. Disc levels: There are degenerative joint changes throughout the cervical spine with narrowed joint space and osteophyte formation. Upper chest: Negative. Other: None. IMPRESSION: Small areas of increased density in the anterior right thalamus and in the posterior left thalamus, hemorrhage is not excluded. Consider further evaluation with MRI of brain. Minimal subcutaneous fat and skin swelling over the posterosuperior right anterior skull at the level the superior orbit. No acute fracture or dislocation of maxillofacial bones or cervical spine. Degenerative joint changes  of cervical spine. These results will be called to the ordering clinician or representative by the Radiologist Assistant, and communication documented in the PACS or zVision Dashboard. Electronically Signed   By: Abelardo Diesel M.D.   On: 11/10/2017 15:53   Ct Cervical Spine Wo Contrast  Result Date: 11/10/2017 CLINICAL DATA:  Status post fall with blunt maxillofacial trauma. EXAM: CT HEAD WITHOUT CONTRAST CT MAXILLOFACIAL WITHOUT CONTRAST CT CERVICAL SPINE WITHOUT CONTRAST TECHNIQUE: Multidetector CT imaging of the head, cervical spine, and maxillofacial structures were performed using the standard protocol without intravenous contrast. Multiplanar CT image reconstructions of the cervical spine and maxillofacial structures were also generated. COMPARISON:  None. FINDINGS: CT HEAD FINDINGS Brain: There is chronic diffuse atrophy. Chronic bilateral periventricular white matter small vessel ischemic changes identified. In the anterior right thalamus, posterior left thalamus, there is increased density, hemorrhage is not excluded. There is no  midline shift or hydrocephalus. No acute transcortical infarct is identified. Vascular: No hyperdense vessel or unexpected calcification. Skull: See maxillofacial CT for further report. The calvarium is intact. Other: None. CT MAXILLOFACIAL FINDINGS Osseous: Prior postsurgical change of the left anterior maxillary wall with mucoperiosteal thickening of the left maxillary sinus are noted. Chronic deformity of the nasal bones are noted. There is no definite acute displaced fracture or dislocation. Orbits: Negative. No traumatic or inflammatory finding. Sinuses: Chronic postsurgical change of the anterior left maxillary sinus with mucoperiosteal thickening and a calcified cyst in the left maxillary sinus are identified. Soft tissues: Minimal subcutaneous fat and skin swelling over the posterosuperior right anterior skull at the level of the superior orbit. CT CERVICAL SPINE FINDINGS Alignment: There is straightening of cervical spine. Skull base and vertebrae: No acute fracture. No primary bone lesion or focal pathologic process. Soft tissues and spinal canal: No prevertebral fluid or swelling. No visible canal hematoma. Disc levels: There are degenerative joint changes throughout the cervical spine with narrowed joint space and osteophyte formation. Upper chest: Negative. Other: None. IMPRESSION: Small areas of increased density in the anterior right thalamus and in the posterior left thalamus, hemorrhage is not excluded. Consider further evaluation with MRI of brain. Minimal subcutaneous fat and skin swelling over the posterosuperior right anterior skull at the level the superior orbit. No acute fracture or dislocation of maxillofacial bones or cervical spine. Degenerative joint changes of cervical spine. These results will be called to the ordering clinician or representative by the Radiologist Assistant, and communication documented in the PACS or zVision Dashboard. Electronically Signed   By: Abelardo Diesel M.D.    On: 11/10/2017 15:53   Mr Brain Wo Contrast  Result Date: 11/10/2017 CLINICAL DATA:  Fall.  Hyperdensity in the thalami on CT. EXAM: MRI HEAD WITHOUT CONTRAST TECHNIQUE: Multiplanar, multiecho pulse sequences of the brain and surrounding structures were obtained without intravenous contrast. COMPARISON:  Head CT 11/10/2017 and MRI 08/03/2017 FINDINGS: Brain: There is no evidence of acute infarct, intracranial hemorrhage, mass, midline shift, or extra-axial fluid collection. There is moderate cerebral atrophy. Small foci of cerebral white matter T2 hyperintensity are unchanged from the prior MRI and nonspecific but compatible with mild chronic small vessel ischemic disease. There is susceptibility artifact and mild T1 shortening in the dorsal left thalamus with a smaller focus in the ventral right thalamus corresponding to the increased density on CT. The signal changes are stable from the prior MRI and consistent with calcification rather than hemorrhage. 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 Vascular: Major intracranial vascular flow voids are preserved. Skull and upper cervical spine: Unremarkable bone marrow signal. Sinuses/Orbits: Unremarkable orbits. Chronic left maxillary sinusitis. Trace bilateral mastoid effusions. Other: None. IMPRESSION: 1. No acute intracranial abnormality. 2. 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 No evidence of hemorrhage. Electronically Signed   By: Logan Bores M.D.   On: 11/10/2017 21:31   Ct Maxillofacial Wo Contrast  Result Date: 11/10/2017 CLINICAL DATA:  Status post fall with blunt maxillofacial trauma. EXAM: CT HEAD WITHOUT CONTRAST CT MAXILLOFACIAL WITHOUT CONTRAST CT CERVICAL SPINE WITHOUT  CONTRAST TECHNIQUE: Multidetector CT imaging of the head, cervical spine, and maxillofacial structures were performed using the standard protocol without intravenous contrast. Multiplanar CT image reconstructions of the cervical spine and maxillofacial structures were also generated. COMPARISON:  None. FINDINGS: CT HEAD FINDINGS Brain: There is chronic diffuse atrophy. Chronic bilateral periventricular white matter small vessel ischemic changes identified. In the anterior right thalamus, posterior left thalamus, there is increased density, hemorrhage is not excluded. There is no midline shift or hydrocephalus. No acute transcortical infarct is identified. Vascular: No hyperdense vessel or unexpected calcification. Skull: See maxillofacial CT for further report. The calvarium is intact. Other: None. CT MAXILLOFACIAL FINDINGS Osseous: Prior postsurgical change of the left anterior maxillary wall with mucoperiosteal thickening of the left maxillary sinus are noted. Chronic deformity of the nasal bones are noted. There is no definite acute displaced fracture or dislocation. Orbits: Negative. No traumatic or inflammatory finding. Sinuses: Chronic postsurgical change of the anterior left maxillary sinus with mucoperiosteal thickening and a calcified cyst in the left maxillary sinus are identified. Soft tissues: Minimal subcutaneous fat and skin swelling over the posterosuperior right anterior skull at the level of the superior orbit. CT CERVICAL SPINE FINDINGS Alignment: There is straightening of cervical spine. Skull base and vertebrae: No acute fracture. No primary bone lesion or focal pathologic process. Soft tissues and spinal canal: No prevertebral fluid or swelling. No visible canal hematoma. Disc levels: There are degenerative joint changes throughout the cervical spine with narrowed joint space and osteophyte formation. Upper chest: Negative. Other: None. IMPRESSION: Small areas of increased density in the  anterior right thalamus and in the posterior left thalamus, hemorrhage is not excluded. Consider further evaluation with MRI of brain. Minimal subcutaneous fat and skin swelling over the posterosuperior right anterior skull at the level the superior orbit. No acute fracture or dislocation of maxillofacial bones or cervical spine. Degenerative joint changes of cervical spine. These results will be called to the ordering clinician or representative by the Radiologist Assistant, and communication documented in the PACS or zVision Dashboard. Electronically Signed   By: Abelardo Diesel M.D.   On: 11/10/2017 15:53       Assessment & Plan:    Principal Problem:   Syncope   Syncope  Tele Trop I q6h x3 Check carotid ultrasound Check cardiac echo Check CXR   Bradycardia HOLD TImolol Cardiology consulted by email regarding syncope/ bradycardia  Hyeprtension Cont Lisinopril 20mg  po qday  ? Dementia Cont Exelon  Glaucoma Cont Xalatan  Anxiety Cont lexapro 20mg  po qday  Dm2 fsbs ac and qhs, ISS       DVT Prophylaxis  Lovenox - SCDs  AM Labs Ordered, also please review Full Orders  Family Communication: Admission, patients condition and plan of care including tests being ordered have been discussed with  the patient  who indicate understanding and agree with the plan and Code Status.  Code Status  FULL CODE  Likely DC to  home  Condition GUARDED    Consults called: cardiology by email  Admission status: observation, pt will be admitted for observation of syncope with LOC as well as monitoring of bradycardia.  Depending upon recommendations by cardiology may need inpatient admission.   Time spent in minutes : 70   Jani Gravel M.D on 11/10/2017 at 11:22 PM  Between 7am to 7pm - Pager - [phone]  After 7pm go to [email or web address] - password [facility]  [facility] - Office  [phone]

## 2017-11-10 NOTE — ED Notes (Signed)
Pt unable to provide urine sample at this time. Urinal at bedside. Pt is aware that urine sample is needed. Will try again later.

## 2017-11-10 NOTE — ED Notes (Signed)
Back from MRI, A/Ox4 with baseline dementia. Vitals stable currently.

## 2017-11-10 NOTE — ED Triage Notes (Signed)
Per family pt fell while at [facility] . Wife reported she noticed Pt's balance was off and took his food tray. Wife reported seeing Pt as he hit floor. Wife reports Pt fell straigt forward face hitting floof and head hit the flor. Pt does not take Blood thinners.  Pt has dementia and does not remember the fall. Pt alert and on arrive to Ed.

## 2017-11-10 NOTE — ED Notes (Signed)
Patient returned from MRI.

## 2017-11-11 ENCOUNTER — Observation Stay (HOSPITAL_BASED_OUTPATIENT_CLINIC_OR_DEPARTMENT_OTHER): Payer: Medicare Other

## 2017-11-11 ENCOUNTER — Encounter (HOSPITAL_COMMUNITY): Payer: Self-pay | Admitting: Internal Medicine

## 2017-11-11 ENCOUNTER — Observation Stay (HOSPITAL_COMMUNITY): Payer: Medicare Other

## 2017-11-11 ENCOUNTER — Other Ambulatory Visit: Payer: Self-pay | Admitting: Medical

## 2017-11-11 ENCOUNTER — Ambulatory Visit: Payer: Medicare Other | Admitting: Podiatry

## 2017-11-11 DIAGNOSIS — I5030 Unspecified diastolic (congestive) heart failure: Secondary | ICD-10-CM | POA: Diagnosis not present

## 2017-11-11 DIAGNOSIS — R55 Syncope and collapse: Secondary | ICD-10-CM | POA: Diagnosis not present

## 2017-11-11 DIAGNOSIS — I10 Essential (primary) hypertension: Secondary | ICD-10-CM | POA: Diagnosis present

## 2017-11-11 DIAGNOSIS — R001 Bradycardia, unspecified: Secondary | ICD-10-CM | POA: Diagnosis present

## 2017-11-11 LAB — CBC
HCT: 46.4 % (ref 39.0–52.0)
Hemoglobin: 15.4 g/dL (ref 13.0–17.0)
MCH: 30 pg (ref 26.0–34.0)
MCHC: 33.2 g/dL (ref 30.0–36.0)
MCV: 90.3 fL (ref 78.0–100.0)
PLATELETS: 330 10*3/uL (ref 150–400)
RBC: 5.14 MIL/uL (ref 4.22–5.81)
RDW: 12.2 % (ref 11.5–15.5)
WBC: 15.3 10*3/uL — AB (ref 4.0–10.5)

## 2017-11-11 LAB — COMPREHENSIVE METABOLIC PANEL
ALK PHOS: 65 U/L (ref 38–126)
ALT: 30 U/L (ref 0–44)
AST: 30 U/L (ref 15–41)
Albumin: 3.6 g/dL (ref 3.5–5.0)
Anion gap: 10 (ref 5–15)
BUN: 13 mg/dL (ref 8–23)
CALCIUM: 9.1 mg/dL (ref 8.9–10.3)
CO2: 26 mmol/L (ref 22–32)
CREATININE: 1.06 mg/dL (ref 0.61–1.24)
Chloride: 108 mmol/L (ref 98–111)
GFR calc Af Amer: >60 mL/min (ref >60)
Glucose, Bld: 114 mg/dL — ABNORMAL HIGH (ref 70–99)
Potassium: 3.9 mmol/L (ref 3.5–5.1)
Sodium: 144 mmol/L (ref 135–145)
Total Bilirubin: 0.9 mg/dL (ref 0.3–1.2)
Total Protein: 6.3 g/dL — ABNORMAL LOW (ref 6.5–8.1)

## 2017-11-11 LAB — ECHOCARDIOGRAM COMPLETE
Height: 72 in
Weight: 3488.56 oz

## 2017-11-11 LAB — TROPONIN I

## 2017-11-11 LAB — GLUCOSE, CAPILLARY
GLUCOSE-CAPILLARY: 99 mg/dL (ref 70–99)
GLUCOSE-CAPILLARY: 117 mg/dL — AB (ref 70–99)
GLUCOSE-CAPILLARY: 111 mg/dL — AB (ref 70–99)
Glucose-Capillary: 111 mg/dL — ABNORMAL HIGH (ref 70–99)

## 2017-11-11 MED ORDER — ONDANSETRON HCL 4 MG/2ML IJ SOLN
INTRAMUSCULAR | Status: AC
  Filled 2017-11-11: qty 2

## 2017-11-11 MED ORDER — LISINOPRIL 40 MG PO TABS: 40.0000 mg | ORAL_TABLET | Freq: Every day | ORAL | 2 refills | Status: DC

## 2017-11-11 MED ORDER — FOLIC ACID 1 MG PO TABS
500.0000 ug | ORAL_TABLET | Freq: Every day | ORAL | Status: DC
  Administered 2017-11-11: 0.5 mg via ORAL
  Filled 2017-11-11: qty 1

## 2017-11-11 MED ORDER — VITAMIN B-12 1000 MCG PO TABS
2500.0000 ug | ORAL_TABLET | Freq: Every day | ORAL | Status: DC
  Administered 2017-11-11: 2500 ug via ORAL
  Filled 2017-11-11: qty 3

## 2017-11-11 MED ORDER — ONDANSETRON HCL 4 MG/2ML IJ SOLN
4.0000 mg | Freq: Four times a day (QID) | INTRAMUSCULAR | Status: DC | PRN
  Administered 2017-11-11: 4 mg via INTRAVENOUS

## 2017-11-11 MED ORDER — LISINOPRIL 20 MG PO TABS
20.0000 mg | ORAL_TABLET | Freq: Every day | ORAL | Status: DC
  Administered 2017-11-11: 20 mg via ORAL
  Filled 2017-11-11: qty 1

## 2017-11-11 MED ORDER — ASPIRIN EC 81 MG PO TBEC
81.0000 mg | DELAYED_RELEASE_TABLET | Freq: Every day | ORAL | Status: DC
  Administered 2017-11-11: 81 mg via ORAL
  Filled 2017-11-11: qty 1

## 2017-11-11 MED ORDER — ESCITALOPRAM OXALATE 20 MG PO TABS
20.0000 mg | ORAL_TABLET | Freq: Every day | ORAL | Status: DC
  Administered 2017-11-11: 20 mg via ORAL
  Filled 2017-11-11: qty 1
  Filled 2017-11-11: qty 2

## 2017-11-11 MED ORDER — VITAMIN D 1000 UNITS PO TABS
2000.0000 [IU] | ORAL_TABLET | Freq: Every day | ORAL | Status: DC
  Administered 2017-11-11: 2000 [IU] via ORAL
  Filled 2017-11-11: qty 2

## 2017-11-11 MED ORDER — VITAMIN E 180 MG (400 UNIT) PO CAPS
400.0000 [IU] | ORAL_CAPSULE | Freq: Every day | ORAL | Status: DC
  Administered 2017-11-11: 400 [IU] via ORAL
  Filled 2017-11-11: qty 1

## 2017-11-11 MED ORDER — BACLOFEN 10 MG PO TABS
10.0000 mg | ORAL_TABLET | Freq: Every day | ORAL | Status: DC
  Administered 2017-11-11: 10 mg via ORAL
  Filled 2017-11-11: qty 1

## 2017-11-11 MED ORDER — LISINOPRIL 40 MG PO TABS: 40.0000 mg | ORAL_TABLET | Freq: Every day | ORAL | Status: DC

## 2017-11-11 MED ORDER — LATANOPROST 0.005 % OP SOLN
1.0000 [drp] | Freq: Every day | OPHTHALMIC | Status: DC
  Administered 2017-11-11: 1 [drp] via OPHTHALMIC
  Filled 2017-11-11: qty 2.5

## 2017-11-11 MED ORDER — RIVASTIGMINE 4.6 MG/24HR TD PT24
4.6000 mg | MEDICATED_PATCH | Freq: Every day | TRANSDERMAL | Status: DC
  Administered 2017-11-11: 4.6 mg via TRANSDERMAL
  Filled 2017-11-11: qty 1

## 2017-11-11 MED ORDER — B COMPLEX-C PO TABS
1.0000 | ORAL_TABLET | Freq: Every day | ORAL | Status: DC
  Administered 2017-11-11: 1 via ORAL
  Filled 2017-11-11: qty 1

## 2017-11-11 NOTE — Evaluation (Signed)
Physical Therapy Evaluation Patient Details Name: Matthew Knight MRN: [id number] DOB: 1941-02-06 Today's Date: 11/11/2017   History of Present Illness  Pt is a 77 y/o male admitted secondary to syncopal episode. MRI negative for acute abnormality, however, did show bilateral thalamic calcifications. C spine and maxillofacial imaging negative for acute abnormality. PMH includes dementia and glaucoma.  Clinical Impression  Pt admitted secondary to problem above with deficits below. Pt easily distractible during mobility tasks and required min to min guard A for mobility without AD. Pt with 1 LOB when trying to navigate obstacle and required mod A for steadying. Educated about need for AD at home to increase safety with mobility. Pt reports wife is present with him at home. Will continue to follow acutely to maximize functional mobility independence and safety.     Follow Up Recommendations Home health PT;Supervision/Assistance - 24 hour    Equipment Recommendations  Rolling walker with 5" wheels    Recommendations for Other Services       Precautions / Restrictions Precautions Precautions: Fall Restrictions Weight Bearing Restrictions: No      Mobility  Bed Mobility Overal bed mobility: Modified Independent                Transfers Overall transfer level: Needs assistance Equipment used: None Transfers: Sit to/from Stand Sit to Stand: Min guard         General transfer comment: Min guard for safety. Increased time required to stand.   Ambulation/Gait Ambulation/Gait assistance: Mod assist;Min guard;Min assist Gait Distance (Feet): 200 Feet Assistive device: None Gait Pattern/deviations: Step-through pattern;Decreased stride length;Drifts right/left Gait velocity: Decreased    General Gait Details: Slow, mildly unsteady gait requiring min to min guard A for steadying. Pt with 1 LOB when trying to navigate obstacle and required mod A for steadying assist.  Educated about use of DME at home to increase safety.   Stairs Stairs: Yes       General stair comments: Verbally reviewed safe step to technique and need for assist at home.   Wheelchair Mobility    Modified Rankin (Stroke Patients Only)       Balance Overall balance assessment: Needs assistance Sitting-balance support: No upper extremity supported;Feet supported Sitting balance-Leahy Scale: Good     Standing balance support: No upper extremity supported;During functional activity Standing balance-Leahy Scale: Fair Standing balance comment: Able to maintain static standing without support                              Pertinent Vitals/Pain Pain Assessment: No/denies pain    Home Living Family/patient expects to be discharged to:: Private residence Living Arrangements: Spouse/significant other Available Help at Discharge: Family;Available 24 hours/day Type of Home: House Home Access: Level entry     Home Layout: Multi-level Home Equipment: None      Prior Function Level of Independence: Independent               Hand Dominance        Extremity/Trunk Assessment   Upper Extremity Assessment Upper Extremity Assessment: Defer to OT evaluation    Lower Extremity Assessment Lower Extremity Assessment: Generalized weakness    Cervical / Trunk Assessment Cervical / Trunk Assessment: Normal  Communication   Communication: HOH  Cognition Arousal/Alertness: Awake/alert Behavior During Therapy: WFL for tasks assessed/performed Overall Cognitive Status: History of cognitive impairments - at baseline  General Comments: History of dementia. Very distractable during mobility tasks and required cues to stay on task.       General Comments General comments (skin integrity, edema, etc.): No family present during session     Exercises     Assessment/Plan    PT Assessment Patient needs continued PT  services  PT Problem List Decreased strength;Decreased balance;Decreased mobility;Decreased cognition;Decreased safety awareness;Decreased knowledge of use of DME;Decreased knowledge of precautions       PT Treatment Interventions DME instruction;Gait training;Stair training;Therapeutic activities;Functional mobility training;Therapeutic exercise;Balance training;Patient/family education    PT Goals (Current goals can be found in the Care Plan section)  Acute Rehab PT Goals Patient Stated Goal: to go home as soon as I can to see my dog  PT Goal Formulation: With patient Time For Goal Achievement: 11/25/17 Potential to Achieve Goals: Good    Frequency Min 3X/week   Barriers to discharge        Co-evaluation               AM-PAC PT "6 Clicks" Daily Activity  Outcome Measure Difficulty turning over in bed (including adjusting bedclothes, sheets and blankets)?: None Difficulty moving from lying on back to sitting on the side of the bed? : A Little Difficulty sitting down on and standing up from a chair with arms (e.g., wheelchair, bedside commode, etc,.)?: Unable Help needed moving to and from a bed to chair (including a wheelchair)?: A Little Help needed walking in hospital room?: A Little Help needed climbing 3-5 steps with a railing? : A Lot 6 Click Score: 16    End of Session Equipment Utilized During Treatment: Gait belt Activity Tolerance: Patient tolerated treatment well Patient left: in chair;with call bell/phone within reach;with chair alarm set Nurse Communication: Mobility status PT Visit Diagnosis: Unsteadiness on feet (R26.81);Muscle weakness (generalized) (M62.81);History of falling (Z91.81)    Time: 3419-6222 PT Time Calculation (min) (ACUTE ONLY): 21 min   Charges:   PT Evaluation $PT Eval Low Complexity: Oto, PT, DPT  Acute Rehabilitation Services  Pager: [phone] Office: [phone]   Rudean Hitt 11/11/2017, 12:05 PM

## 2017-11-11 NOTE — Evaluation (Signed)
Occupational Therapy Evaluation Patient Details Name: Matthew Knight MRN: [id number] DOB: [birth year] Today's Date: 11/11/2017    History of Present Illness Pt is a 77 y/o male admitted secondary to syncopal episode. MRI negative for acute abnormality, however, did show bilateral thalamic calcifications. C spine and maxillofacial imaging negative for acute abnormality. PMH includes dementia and glaucoma.   Clinical Impression   This 77 y/o male presents with the above. At baseline pt reports independence with ADLs and functional mobility, lives at home with spouse and his dog. Pt completing room level functional mobility without AD and overall minA. He currently requires minA for standing grooming and LB ADLs, setup assist for seated UB ADLs. Pt pleasant and willing to engage in therapy session, though is easily distracted and requires cues to remain focused on functional tasks. Pt will benefit from continued acute OT services and recommend follow up [place] therapy services after discharge to maximize his overall safety and independence with ADLs and mobility after discharge home. Will follow.    Follow Up Recommendations  Home health OT;Supervision/Assistance - 24 hour    Equipment Recommendations  None recommended by OT           Precautions / Restrictions Precautions Precautions: Fall Restrictions Weight Bearing Restrictions: No      Mobility Bed Mobility Overal bed mobility: Modified Independent                Transfers Overall transfer level: Needs assistance Equipment used: 1 person hand held assist Transfers: Sit to/from Stand Sit to Stand: Min assist         General transfer comment: pt seeking HHA for sit<>stand, completing with minA to rise and steady    Balance Overall balance assessment: Needs assistance Sitting-balance support: No upper extremity supported;Feet supported Sitting balance-Leahy Scale: Good     Standing balance support: No upper  extremity supported;During functional activity Standing balance-Leahy Scale: Fair Standing balance comment: Able to maintain static standing without support                            ADL either performed or assessed with clinical judgement   ADL Overall ADL's : Needs assistance/impaired Eating/Feeding: Set up;Sitting   Grooming: Min guard;Minimal assistance;Standing;Oral care;Wash/dry face Grooming Details (indicate cue type and reason): requires cues to remain focused on task completion  Upper Body Bathing: Min guard;Sitting   Lower Body Bathing: Minimal assistance;Sit to/from stand   Upper Body Dressing : Min guard;Sitting   Lower Body Dressing: Minimal assistance;Sit to/from stand Lower Body Dressing Details (indicate cue type and reason): pt able to bring LEs up towards figure 4 position for completion  Toilet Transfer: Minimal assistance;Ambulation;Regular Toilet   Toileting- Clothing Manipulation and Hygiene: Minimal assistance;Sit to/from stand       Functional mobility during ADLs: Minimal assistance       Vision         Perception     Praxis      Pertinent Vitals/Pain Pain Assessment: No/denies pain     Hand Dominance     Extremity/Trunk Assessment Upper Extremity Assessment Upper Extremity Assessment: Generalized weakness   Lower Extremity Assessment Lower Extremity Assessment: Defer to PT evaluation   Cervical / Trunk Assessment Cervical / Trunk Assessment: Normal   Communication Communication Communication: HOH   Cognition Arousal/Alertness: Awake/alert Behavior During Therapy: WFL for tasks assessed/performed Overall Cognitive Status: History of cognitive impairments - at baseline  General Comments: History of dementia. Very distractable during functional tasks and required cues to stay on task.    General Comments  No family present during session     Exercises     Shoulder  Instructions      Home Living Family/patient expects to be discharged to:: Private residence Living Arrangements: Spouse/significant other Available Help at Discharge: Family;Available 24 hours/day Type of Home: House Home Access: Level entry     Home Layout: Multi-level Alternate Level Stairs-Number of Steps: flight  Alternate Level Stairs-Rails: (has rails but not sure what side ) Bathroom Shower/Tub: Occupational psychologist: Standard     Home Equipment: None          Prior Functioning/Environment Level of Independence: Independent                 OT Problem List: Decreased strength;Decreased cognition;Impaired balance (sitting and/or standing)      OT Treatment/Interventions: Self-care/ADL training;DME and/or AE instruction;Therapeutic activities;Balance training;Therapeutic exercise;Patient/family education    OT Goals(Current goals can be found in the care plan section) Acute Rehab OT Goals Patient Stated Goal: to go home as soon as I can to see my dog Matthew Knight OT Goal Formulation: With patient Time For Goal Achievement: 11/25/17 Potential to Achieve Goals: Good  OT Frequency: Min 2X/week   Barriers to D/C:            Co-evaluation              AM-PAC PT "6 Clicks" Daily Activity     Outcome Measure Help from another person eating meals?: None Help from another person taking care of personal grooming?: A Little Help from another person toileting, which includes using toliet, bedpan, or urinal?: A Little Help from another person bathing (including washing, rinsing, drying)?: A Little Help from another person to put on and taking off regular upper body clothing?: None Help from another person to put on and taking off regular lower body clothing?: A Little 6 Click Score: 20   End of Session Equipment Utilized During Treatment: Gait belt Nurse Communication: Mobility status  Activity Tolerance: Patient tolerated treatment well Patient  left: in bed;with call bell/phone within reach;with bed alarm set  OT Visit Diagnosis: Muscle weakness (generalized) (M62.81)                Time: 9562-1308 OT Time Calculation (min): 20 min Charges:  OT General Charges $OT Visit: 1 Visit OT Evaluation $OT Eval Moderate Complexity: 1 Mod  Matthew Knight, OT [employer] Pager [phone] Office [phone]    Matthew Knight 11/11/2017, 1:35 PM

## 2017-11-11 NOTE — Care Management Obs Status (Signed)
[facility] NOTIFICATION   Patient Details  Name: TOSH GLAZE MRN: [id number] Date of Birth: 10/05/1940   Medicare Observation Status Notification Given:  Yes    Bethena Roys, RN 11/11/2017, 3:27 PM

## 2017-11-11 NOTE — Consult Note (Addendum)
Cardiology Consultation:   Patient ID: [name]; [id number]; [birth year]   Admit date: 11/10/2017 Date of Consult: 11/11/2017  Primary Care Provider: [name], MD Primary Cardiologist: New to [place]; Dr. Percival Spanish Primary Electrophysiologist:  None   Patient Profile:   Matthew Knight is a 77 y.o. male with a PMH of HTN, small bilateral common iliac artery aneurysms (followed by Dr. Donnetta Hutching), dementia, depression/anxiety, glaucoma, and tobacco abuse who is being seen today for the evaluation of syncope and bradycardia at the request of Dr. Darrick Meigs.  History of Present Illness:   Matthew Knight was in his usual state of health until the afternoon of 11/10/17 when he experienced a syncopal event. Patient does not recall the details surrounding his event and history was primarily obtained from his wife at bedside. He had just finished eating lunch with his wife at [facility] and was taking his tray to the trash can when she noticed that he started to lose his grip on the tray. She took it from him and subsequently heard a loud crash behind her. She states he was unconscious for <1 minute and was confused when he regained consciousness. He had abrasions to his nose and mouth that were bleeding. She denied any seizure like activity. He had no complaints of chest pain, dizziness, lightheadedness, or SOB surrounding the event. Prior to this, he has only experienced one other fall while ambulating in his driveway which was unwitnessed and unclear whether a mechanical fall or syncope and resulted in a humerus fracture. She states in the past 2 weeks he has complained about fatigue.   He denies prior cardiac history or history of arrhythmias. He has never had an ischemic work-up in the past. He is a current every day pipe smoker. He has a family history of heart disease in his father who suffered a fatal MI at age 75. Wife states he is the only male in his family to live past age 3.    At the time of this evaluation he feels back to his usual self. No complaints of chest pain, SOB, dizziness, lightheadedness, fever, recent URI, or difficulty urinating. Wife reports that he has been on timolol eye drops for glaucoma for ~1 year.    Hospital course: Bradycardic to the upper 40s on arrival (currently in the 50s-60s), hypertensive, otherwise VSS. Labs notable for electrolytes wnl, Cr 1.09, WBC 13.9>15.3, PLTs 330s. Trop negative x3. CXR without acute findings but revealed scarring suspicious for asbestos exposure. CT C-spine/maxillofacial were without fractures or dislocations. CT head with ?thalamus abnormalities and MRI brain was recommended which did not reveal any acute findings. He was admitted to medicine and home medications were continued. Cardiology asked to evaluate patient for bradycardia/syncope.   Past Medical History:  Diagnosis Date  . Diabetes mellitus without complication ([place])   . Hearing loss   . Hepatitis   . Small vessel disease [facility])     Past Surgical History:  Procedure Laterality Date  . APPENDECTOMY       Home Medications:  Prior to Admission medications   Medication Sig Start Date End Date Taking? Authorizing Provider  aspirin EC 81 MG tablet Take 81 mg by mouth daily.    Yes [provider]  B Complex Vitamins (VITAMIN-B COMPLEX) TABS Take 1 tablet by mouth daily.    Yes [provider]  baclofen (LIORESAL) 10 MG tablet Take 10 mg by mouth daily.  03/17/17  Yes [provider]  Cholecalciferol (VITAMIN D3) 2000  units capsule Take 2,000 Units by mouth daily.    Yes [provider]  Cyanocobalamin (VITAMIN B-12) 2500 MCG SUBL Take 2,500 mcg by mouth daily.    Yes [provider]  escitalopram (LEXAPRO) 20 MG tablet Take 20 mg by mouth daily.  03/14/15  Yes [provider]  folic acid (FOLVITE) 784 MCG tablet Take 400 mcg by mouth daily. Reported on 05/06/2015   Yes [provider]   latanoprost (XALATAN) 0.005 % ophthalmic solution Place 1 drop into both eyes at bedtime.  07/27/17  Yes [provider]  lisinopril (PRINIVIL,ZESTRIL) 20 MG tablet Take 20 mg by mouth daily.  10/21/16  Yes [provider]  naproxen (NAPROSYN) 375 MG tablet Take 1 tablet (375 mg total) by mouth 2 (two) times daily. 03/08/17  Yes Charlesetta Shanks, MD  rivastigmine (EXELON) 4.6 mg/24hr Place 4.6 mg onto the skin daily.  10/14/14  Yes [provider]  timolol (TIMOPTIC) 0.5 % ophthalmic solution Place 1 drop into both eyes daily.  06/14/16  Yes [provider]  Vitamin E 400 units TABS Take 400 Units by mouth daily.  02/16/13  Yes [provider]    Inpatient Medications: Scheduled Meds: . aspirin EC  81 mg Oral Daily  . B-complex with vitamin C  1 tablet Oral Daily  . baclofen  10 mg Oral Daily  . cholecalciferol  2,000 Units Oral Daily  . enoxaparin (LOVENOX) injection  40 mg Subcutaneous Q24H  . escitalopram  20 mg Oral Daily  . folic acid  696 mcg Oral Daily  . latanoprost  1 drop Both Eyes QHS  . lisinopril  20 mg Oral Daily  . rivastigmine  4.6 mg Transdermal Daily  . vitamin B-12  2,500 mcg Oral Daily  . vitamin E  400 Units Oral Daily   Continuous Infusions:  PRN Meds: acetaminophen **OR** acetaminophen, ondansetron (ZOFRAN) IV  Allergies:    Allergies  Allergen Reactions  . Sulfa Antibiotics     Other reaction(s): Other (See Comments) States made him crazy    Social History:   Social History   Socioeconomic History  . Marital status: Married    Spouse name: Not on file  . Number of children: Not on file  . Years of education: Not on file  . Highest education level: Not on file  Occupational History  . Not on file  Social Needs  . Financial resource strain: Not on file  . Food insecurity:    Worry: Not on file    Inability: Not on file  . Transportation needs:    Medical: Not on file    Non-medical: Not on file  Tobacco  Use  . Smoking status: Current Every Day Smoker    Types: Pipe  . Smokeless tobacco: Never Used  Substance and Sexual Activity  . Alcohol use: No    Alcohol/week: 0.0 standard drinks  . Drug use: No  . Sexual activity: Not on file  Lifestyle  . Physical activity:    Days per week: Not on file    Minutes per session: Not on file  . Stress: Not on file  Relationships  . Social connections:    Talks on phone: Not on file    Gets together: Not on file    Attends religious service: Not on file    Active member of club or organization: Not on file    Attends meetings of clubs or organizations: Not on file    Relationship status:  Not on file  . Intimate partner violence:    Fear of current or ex partner: Not on file    Emotionally abused: Not on file    Physically abused: Not on file    Forced sexual activity: Not on file  Other Topics Concern  . Not on file  Social History Narrative  . Not on file    Family History:    Family History  Problem Relation Age of Onset  . Diabetes Mother   . Heart disease Father   . Memory loss Paternal Grandfather      ROS:  Please see the history of present illness.   All other ROS reviewed and negative.     Physical Exam/Data:   Vitals:   11/10/17 2300 11/10/17 2345 11/11/17 0500 11/11/17 1158  BP:  (!) 160/79 133/63 (!) 154/69  Pulse:  66 63 (!) 57  Resp:  20 20 20   Temp:  98.9 F (37.2 C) 98.3 F (36.8 C) 97.8 F (36.6 C)  TempSrc:  Oral Oral Oral  SpO2:  91% 93% 94%  Weight: 98.9 kg     Height: 6' (1.829 m)       Intake/Output Summary (Last 24 hours) at 11/11/2017 1309 Last data filed at 11/11/2017 1100 Gross per 24 hour  Intake 600 ml  Output 125 ml  Net 475 ml   Filed Weights   11/10/17 2300  Weight: 98.9 kg   Body mass index is 29.57 kg/m.  General:  Well nourished, well developed, laying in bed in no acute distress HEENT: sclera anicteric, mild facial abrasions noted with mild ecchymosis/swelling Neck: no  JVD Vascular: No carotid bruits; distal pulses 2+ bilaterally Cardiac:  normal S1, S2; RRR; no murmurs, rubs, or gallops Lungs:  clear to auscultation bilaterally, no wheezing, rhonchi or rales  Abd: NABS, soft, nontender, no hepatomegaly Ext: no edema Musculoskeletal:  No deformities, BUE and BLE strength normal and equal Skin: warm and dry  Neuro:  CNs 2-12 intact, no focal abnormalities noted Psych:  Pleasantly demented   EKG:  The EKG was personally reviewed and demonstrates:  Sinus bradycardia with rate 48, early repolarization in inferior leads, and isolated TWI in aVL; QTc 422. No STE/D. Telemetry:  Telemetry was personally reviewed and demonstrates:  Sinus bradycardia with brief sinus pause overnight ~2 seconds.   Relevant CV Studies: Echo pending  Laboratory Data:  Chemistry Recent Labs  Lab 11/10/17 1545 11/11/17 0543  NA 142 144  K 4.4 3.9  CL 106 108  CO2 25 26  GLUCOSE 110* 114*  BUN 16 13  CREATININE 1.09 1.06  CALCIUM 9.1 9.1  GFRNONAA >60 >60  GFRAA >60 >60  ANIONGAP 11 10    Recent Labs  Lab 11/10/17 1545 11/11/17 0543  PROT 6.5 6.3*  ALBUMIN 3.8 3.6  AST 41 30  ALT 36 30  ALKPHOS 73 65  BILITOT 1.0 0.9   Hematology Recent Labs  Lab 11/10/17 1545 11/11/17 0543  WBC 13.9* 15.3*  RBC 5.58 5.14  HGB 16.7 15.4  HCT 50.8 46.4  MCV 91.0 90.3  MCH 29.9 30.0  MCHC 32.9 33.2  RDW 12.3 12.2  PLT 336 330   Cardiac Enzymes Recent Labs  Lab 11/10/17 1545 11/10/17 2339 11/11/17 0543  TROPONINI <0.03 <0.03 <0.03   No results for input(s): TROPIPOC in the last 168 hours.  BNPNo results for input(s): BNP, PROBNP in the last 168 hours.  DDimer No results for input(s): DDIMER in the last 168 hours.  Radiology/Studies:  Dg Chest 2 View  Result Date: 11/11/2017 CLINICAL DATA:  Syncope 2 days ago, diabetes mellitus EXAM: CHEST - 2 VIEW COMPARISON:  01/07/2010 FINDINGS: Lordotic positioning. Normal heart size, mediastinal contours, and pulmonary  vascularity. Scarring question calcified plaques in the mid lungs bilaterally. No acute infiltrate, pleural effusion or pneumothorax. Bones demineralized with postsurgical changes of ORIF proximal RIGHT humerus, incompletely visualized. IMPRESSION: Scarring in the mid lungs bilaterally suspect calcified plaques, question asbestos exposure. No acute infiltrate. Electronically Signed   By: Lavonia Dana M.D.   On: 11/11/2017 08:29   Ct Head Wo Contrast  Result Date: 11/10/2017 CLINICAL DATA:  Status post fall with blunt maxillofacial trauma. EXAM: CT HEAD WITHOUT CONTRAST CT MAXILLOFACIAL WITHOUT CONTRAST CT CERVICAL SPINE WITHOUT CONTRAST TECHNIQUE: Multidetector CT imaging of the head, cervical spine, and maxillofacial structures were performed using the standard protocol without intravenous contrast. Multiplanar CT image reconstructions of the cervical spine and maxillofacial structures were also generated. COMPARISON:  None. FINDINGS: CT HEAD FINDINGS Brain: There is chronic diffuse atrophy. Chronic bilateral periventricular white matter small vessel ischemic changes identified. In the anterior right thalamus, posterior left thalamus, there is increased density, hemorrhage is not excluded. There is no midline shift or hydrocephalus. No acute transcortical infarct is identified. Vascular: No hyperdense vessel or unexpected calcification. Skull: See maxillofacial CT for further report. The calvarium is intact. Other: None. CT MAXILLOFACIAL FINDINGS Osseous: Prior postsurgical change of the left anterior maxillary wall with mucoperiosteal thickening of the left maxillary sinus are noted. Chronic deformity of the nasal bones are noted. There is no definite acute displaced fracture or dislocation. Orbits: Negative. No traumatic or inflammatory finding. Sinuses: Chronic postsurgical change of the anterior left maxillary sinus with mucoperiosteal thickening and a calcified cyst in the left maxillary sinus are  identified. Soft tissues: Minimal subcutaneous fat and skin swelling over the posterosuperior right anterior skull at the level of the superior orbit. CT CERVICAL SPINE FINDINGS Alignment: There is straightening of cervical spine. Skull base and vertebrae: No acute fracture. No primary bone lesion or focal pathologic process. Soft tissues and spinal canal: No prevertebral fluid or swelling. No visible canal hematoma. Disc levels: There are degenerative joint changes throughout the cervical spine with narrowed joint space and osteophyte formation. Upper chest: Negative. Other: None. IMPRESSION: Small areas of increased density in the anterior right thalamus and in the posterior left thalamus, hemorrhage is not excluded. Consider further evaluation with MRI of brain. Minimal subcutaneous fat and skin swelling over the posterosuperior right anterior skull at the level the superior orbit. No acute fracture or dislocation of maxillofacial bones or cervical spine. Degenerative joint changes of cervical spine. These results will be called to the ordering clinician or representative by the Radiologist Assistant, and communication documented in the PACS or zVision Dashboard. Electronically Signed   By: Abelardo Diesel M.D.   On: 11/10/2017 15:53   Ct Cervical Spine Wo Contrast  Result Date: 11/10/2017 CLINICAL DATA:  Status post fall with blunt maxillofacial trauma. EXAM: CT HEAD WITHOUT CONTRAST CT MAXILLOFACIAL WITHOUT CONTRAST CT CERVICAL SPINE WITHOUT CONTRAST TECHNIQUE: Multidetector CT imaging of the head, cervical spine, and maxillofacial structures were performed using the standard protocol without intravenous contrast. Multiplanar CT image reconstructions of the cervical spine and maxillofacial structures were also generated. COMPARISON:  None. FINDINGS: CT HEAD FINDINGS Brain: There is chronic diffuse atrophy. Chronic bilateral periventricular white matter small vessel ischemic changes identified. In the anterior  right thalamus, posterior left thalamus, there is increased  density, hemorrhage is not excluded. There is no midline shift or hydrocephalus. No acute transcortical infarct is identified. Vascular: No hyperdense vessel or unexpected calcification. Skull: See maxillofacial CT for further report. The calvarium is intact. Other: None. CT MAXILLOFACIAL FINDINGS Osseous: Prior postsurgical change of the left anterior maxillary wall with mucoperiosteal thickening of the left maxillary sinus are noted. Chronic deformity of the nasal bones are noted. There is no definite acute displaced fracture or dislocation. Orbits: Negative. No traumatic or inflammatory finding. Sinuses: Chronic postsurgical change of the anterior left maxillary sinus with mucoperiosteal thickening and a calcified cyst in the left maxillary sinus are identified. Soft tissues: Minimal subcutaneous fat and skin swelling over the posterosuperior right anterior skull at the level of the superior orbit. CT CERVICAL SPINE FINDINGS Alignment: There is straightening of cervical spine. Skull base and vertebrae: No acute fracture. No primary bone lesion or focal pathologic process. Soft tissues and spinal canal: No prevertebral fluid or swelling. No visible canal hematoma. Disc levels: There are degenerative joint changes throughout the cervical spine with narrowed joint space and osteophyte formation. Upper chest: Negative. Other: None. IMPRESSION: Small areas of increased density in the anterior right thalamus and in the posterior left thalamus, hemorrhage is not excluded. Consider further evaluation with MRI of brain. Minimal subcutaneous fat and skin swelling over the posterosuperior right anterior skull at the level the superior orbit. No acute fracture or dislocation of maxillofacial bones or cervical spine. Degenerative joint changes of cervical spine. These results will be called to the ordering clinician or representative by the Radiologist Assistant, and  communication documented in the PACS or zVision Dashboard. Electronically Signed   By: Abelardo Diesel M.D.   On: 11/10/2017 15:53   Mr Brain Wo Contrast  Result Date: 11/10/2017 CLINICAL DATA:  Fall.  Hyperdensity in the thalami on CT. EXAM: MRI HEAD WITHOUT CONTRAST TECHNIQUE: Multiplanar, multiecho pulse sequences of the brain and surrounding structures were obtained without intravenous contrast. COMPARISON:  Head CT 11/10/2017 and MRI 08/03/2017 FINDINGS: Brain: There is no evidence of acute infarct, intracranial hemorrhage, mass, midline shift, or extra-axial fluid collection. There is moderate cerebral atrophy. Small foci of cerebral white matter T2 hyperintensity are unchanged from the prior MRI and nonspecific but compatible with mild chronic small vessel ischemic disease. There is susceptibility artifact and mild T1 shortening in the dorsal left thalamus with a smaller focus in the ventral right thalamus corresponding to the increased density on CT. The signal changes are stable from the prior MRI and consistent with calcification rather than hemorrhage. 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 Vascular: Major intracranial vascular flow voids are preserved. Skull and upper cervical spine: Unremarkable bone marrow signal. Sinuses/Orbits: Unremarkable orbits. Chronic left maxillary sinusitis. Trace bilateral mastoid effusions. Other: None. IMPRESSION: 1. No acute intracranial abnormality. 2. 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 No evidence of hemorrhage. Electronically Signed   By: Logan Bores M.D.   On: 11/10/2017 21:31   Ct Maxillofacial Wo Contrast  Result Date: 11/10/2017 CLINICAL DATA:  Status post fall with blunt  maxillofacial trauma. EXAM: CT HEAD WITHOUT CONTRAST CT MAXILLOFACIAL WITHOUT CONTRAST CT CERVICAL SPINE WITHOUT CONTRAST TECHNIQUE: Multidetector CT imaging of the head, cervical spine, and maxillofacial structures were performed using the standard protocol without intravenous contrast. Multiplanar CT image reconstructions  of the cervical spine and maxillofacial structures were also generated. COMPARISON:  None. FINDINGS: CT HEAD FINDINGS Brain: There is chronic diffuse atrophy. Chronic bilateral periventricular white matter small vessel ischemic changes identified. In the anterior right thalamus, posterior left thalamus, there is increased density, hemorrhage is not excluded. There is no midline shift or hydrocephalus. No acute transcortical infarct is identified. Vascular: No hyperdense vessel or unexpected calcification. Skull: See maxillofacial CT for further report. The calvarium is intact. Other: None. CT MAXILLOFACIAL FINDINGS Osseous: Prior postsurgical change of the left anterior maxillary wall with mucoperiosteal thickening of the left maxillary sinus are noted. Chronic deformity of the nasal bones are noted. There is no definite acute displaced fracture or dislocation. Orbits: Negative. No traumatic or inflammatory finding. Sinuses: Chronic postsurgical change of the anterior left maxillary sinus with mucoperiosteal thickening and a calcified cyst in the left maxillary sinus are identified. Soft tissues: Minimal subcutaneous fat and skin swelling over the posterosuperior right anterior skull at the level of the superior orbit. CT CERVICAL SPINE FINDINGS Alignment: There is straightening of cervical spine. Skull base and vertebrae: No acute fracture. No primary bone lesion or focal pathologic process. Soft tissues and spinal canal: No prevertebral fluid or swelling. No visible canal hematoma. Disc levels: There are degenerative joint changes throughout the cervical spine with narrowed joint space and  osteophyte formation. Upper chest: Negative. Other: None. IMPRESSION: Small areas of increased density in the anterior right thalamus and in the posterior left thalamus, hemorrhage is not excluded. Consider further evaluation with MRI of brain. Minimal subcutaneous fat and skin swelling over the posterosuperior right anterior skull at the level the superior orbit. No acute fracture or dislocation of maxillofacial bones or cervical spine. Degenerative joint changes of cervical spine. These results will be called to the ordering clinician or representative by the Radiologist Assistant, and communication documented in the PACS or zVision Dashboard. Electronically Signed   By: Abelardo Diesel M.D.   On: 11/10/2017 15:53    Assessment and Plan:   1. Syncope: patient experienced syncope from standing position on 11/10/17 with <1 minute of unconsciousness. No pre/post event complaints of chest pain, SOB, dizziness, or lightheadedness. He experience one other unwitnessed fall 03/2017 which was either mechanical or syncope. He has reported feeling fatigued in the past 2 weeks. He has been on timolol eye drops for glaucoma for the past year. No prior cardiac history. He was bradycardic to the 40s on arrival with improvement to the 50s-60s at this time with home timolol being held. Troponins are negative x3. EKG non-ischemic. Head/Face/C-spine imaging negative. Carotid duplex without significant stenosis. No significant pauses or arrhythmias noted on telemetry - Echo pending - if normal, anticipate he can be discharged home from a cardiology standpoint.  - Discontinue timolol eye drops - family plans to follow-up with eye doctor after discharge for alternative recommendation - Will plan for outpatient event monitor for further evaluation  2. Bradycardia: He was bradycardic to the 40s on arrival to the ED. He is on timolol eye gtts for glaucoma but no other AV nodal blocking agents. Timolol was held on admission and HR  improved to the 50s-60s.  - Discontinue timolol eye drops - family plans to follow-up with eye doctor after discharge for alternative recommendation - Will plan for outpatient event monitor for further evaluation  3. HTN: poorly controlled BP this admission.  - Will increased lisinopril to 40mg  daily  4. Bilateral iliac artery aneurysm: noted to be mild. Follows outpatient with  Dr. Donnetta Hutching - Continue routine monitoring per Dr. Donnetta Hutching  5. Glaucoma: timolol eye drops discontinued given syncope and bradycardia.  - Patient to follow-up outpatient with eye doctor to discuss alternative therapy.    For questions or updates, please contact [place] Please consult [email or web address] for contact info under Cardiology/STEMI.   Signed, Abigail Butts, PA-C  11/11/2017 1:09 PM [phone]  History and all data above reviewed.  Patient examined.  I agree with the findings as above.  The patient had an episode of syncope while getting up to empty his tray at [facility].  He does not recall the event.  There was no apparent prodrome and he had no chance to brace his fall.  He did have sinus bradycardia on presentation.  He has not had any significant bradyarrhythmias overnight on tele.  Prelim echo is normal.  He has otherwise felt fine.  The patient denies any new symptoms such as chest discomfort, neck or arm discomfort. There has been no new shortness of breath, PND or orthopnea. There have been no reported palpitations, or presyncope.   The patient exam reveals COR:RRR  ,  Lungs: Clear  ,  Abd: Positive bowel sounds, no rebound no guarding, Ext No edema  .  All available labs, radiology testing, previous records reviewed. Agree with documented assessment and plan.  Syncope:  No clear etiology.  Discontinue the Timolol.  I OK to discharge home and we will call and have him wear a 4 week event monitor that he can have placed in 5 days or so.    Jeneen Rinks Maggy Wyble  4:24 PM  11/11/2017

## 2017-11-11 NOTE — Progress Notes (Signed)
Patient with emesis at this time.  No PRNs ordered, RN text paged [facility] with this information.

## 2017-11-11 NOTE — Progress Notes (Signed)
Bilateral carotid duplex completed. Preliminary report - 1% to 39 % ICA stenosis. Vertebral arery flow is antegrade. [place] Jayelle Page,RVS  11/11/2017 11:06 AM

## 2017-11-11 NOTE — Progress Notes (Signed)
OT Cancellation Note  Patient Details Name: Matthew Knight MRN: [id number] DOB: [birth year]   Cancelled Treatment:    Reason Eval/Treat Not Completed: Other (comment); pt just received lunch tray and wishing to eat. Will follow up this afternoon for OT eval.  Lou Cal, OT Pager [phone] 11/11/2017   Raymondo Band 11/11/2017, 12:06 PM

## 2017-11-11 NOTE — Plan of Care (Signed)

## 2017-11-11 NOTE — Progress Notes (Signed)
[facility]  PROGRESS NOTE  Matthew Knight IWL:[id number] DOB: [birth year] DOA: 11/10/2017 PCP: [name], MD   Brief HPI:   77 year old male with a history of glaucoma, came to hospital after he had a syncopal episode at home.  Patient apparently fell forward hitting his face.  No seizure activity was documented.  Imaging study showed CT head showed no acute abnormality, MRI brain also showed no evidence of hemorrhage.    Subjective   Patient seen and examined, this morning he denies chest pain or shortness of breath.   Assessment/Plan:     1. Syncope-unclear etiology, echocardiogram has been ordered, continue serial troponin every 6 hours x3.  Cardiology has been consulted.  Continue monitoring on telemetry.  2. Bradycardia- [place] is currently hold.  Continue monitoring on telemetry.  Cardiology to follow  3. Hypertension-continue lisinopril  4. ?  Dementia-continue Exelon  5. Glaucoma-continue latanoprost 1 drop nightly both eyes  6. Diabetes mellitus type 2-continue sliding scale insulin with NovoLog.  Blood glucose is well controlled     CBG: Recent Labs  Lab 11/11/17 0108 11/11/17 0742 11/11/17 1156  GLUCAP 111* 99 117*    CBC: Recent Labs  Lab 11/10/17 1545 11/11/17 0543  WBC 13.9* 15.3*  NEUTROABS 11.0*  --   HGB 16.7 15.4  HCT 50.8 46.4  MCV 91.0 90.3  PLT 336 174    Basic Metabolic Panel: Recent Labs  Lab 11/10/17 1545 11/11/17 0543  NA 142 144  K 4.4 3.9  CL 106 108  CO2 25 26  GLUCOSE 110* 114*  BUN 16 13  CREATININE 1.09 1.06  CALCIUM 9.1 9.1     DVT prophylaxis: Lovenox  Code Status: Full code  Family Communication: No family at bedside  Disposition Plan: likely home when medically ready for discharge   Consultants:    Procedures:     Antibiotics:   Anti-infectives (From admission, onward)   None       Objective   Vitals:   11/10/17 2300 11/10/17 2345 11/11/17 0500 11/11/17 1158  BP:   (!) 160/79 133/63 (!) 154/69  Pulse:  66 63 (!) 57  Resp:  20 20 20   Temp:  98.9 F (37.2 C) 98.3 F (36.8 C) 97.8 F (36.6 C)  TempSrc:  Oral Oral Oral  SpO2:  91% 93% 94%  Weight: 98.9 kg     Height: 6' (1.829 m)       Intake/Output Summary (Last 24 hours) at 11/11/2017 1441 Last data filed at 11/11/2017 1100 Gross per 24 hour  Intake 600 ml  Output 125 ml  Net 475 ml   Filed Weights   11/10/17 2300  Weight: 98.9 kg     Physical Examination:    General: Appears in no acute distress  Cardiovascular: S1-S2, regular, no murmur auscultated  Respiratory: Clear bilaterally, no wheezing  Abdomen: Soft, nontender, no organomegaly  Extremities: No edema in the lower extremities  Neurologic: Alert, oriented x3, no focal deficit noted     Data Reviewed: I have personally reviewed following labs and imaging studies   No results found for this or any previous visit (from the past 240 hour(s)).   Liver Function Tests: Recent Labs  Lab 11/10/17 1545 11/11/17 0543  AST 41 30  ALT 36 30  ALKPHOS 73 65  BILITOT 1.0 0.9  PROT 6.5 6.3*  ALBUMIN 3.8 3.6   No results for input(s): LIPASE, AMYLASE in the last 168 hours. No results for input(s): AMMONIA in the last  168 hours.  Cardiac Enzymes: Recent Labs  Lab 11/10/17 1545 11/10/17 2339 11/11/17 0543  TROPONINI <0.03 <0.03 <0.03   BNP (last 3 results) No results for input(s): BNP in the last 8760 hours.  ProBNP (last 3 results) No results for input(s): PROBNP in the last 8760 hours.    Studies: Dg Chest 2 View  Result Date: 11/11/2017 CLINICAL DATA:  Syncope 2 days ago, diabetes mellitus EXAM: CHEST - 2 VIEW COMPARISON:  01/07/2010 FINDINGS: Lordotic positioning. Normal heart size, mediastinal contours, and pulmonary vascularity. Scarring question calcified plaques in the mid lungs bilaterally. No acute infiltrate, pleural effusion or pneumothorax. Bones demineralized with postsurgical changes of ORIF  proximal RIGHT humerus, incompletely visualized. IMPRESSION: Scarring in the mid lungs bilaterally suspect calcified plaques, question asbestos exposure. No acute infiltrate. Electronically Signed   By: Lavonia Dana M.D.   On: 11/11/2017 08:29   Ct Head Wo Contrast  Result Date: 11/10/2017 CLINICAL DATA:  Status post fall with blunt maxillofacial trauma. EXAM: CT HEAD WITHOUT CONTRAST CT MAXILLOFACIAL WITHOUT CONTRAST CT CERVICAL SPINE WITHOUT CONTRAST TECHNIQUE: Multidetector CT imaging of the head, cervical spine, and maxillofacial structures were performed using the standard protocol without intravenous contrast. Multiplanar CT image reconstructions of the cervical spine and maxillofacial structures were also generated. COMPARISON:  None. FINDINGS: CT HEAD FINDINGS Brain: There is chronic diffuse atrophy. Chronic bilateral periventricular white matter small vessel ischemic changes identified. In the anterior right thalamus, posterior left thalamus, there is increased density, hemorrhage is not excluded. There is no midline shift or hydrocephalus. No acute transcortical infarct is identified. Vascular: No hyperdense vessel or unexpected calcification. Skull: See maxillofacial CT for further report. The calvarium is intact. Other: None. CT MAXILLOFACIAL FINDINGS Osseous: Prior postsurgical change of the left anterior maxillary wall with mucoperiosteal thickening of the left maxillary sinus are noted. Chronic deformity of the nasal bones are noted. There is no definite acute displaced fracture or dislocation. Orbits: Negative. No traumatic or inflammatory finding. Sinuses: Chronic postsurgical change of the anterior left maxillary sinus with mucoperiosteal thickening and a calcified cyst in the left maxillary sinus are identified. Soft tissues: Minimal subcutaneous fat and skin swelling over the posterosuperior right anterior skull at the level of the superior orbit. CT CERVICAL SPINE FINDINGS Alignment: There is  straightening of cervical spine. Skull base and vertebrae: No acute fracture. No primary bone lesion or focal pathologic process. Soft tissues and spinal canal: No prevertebral fluid or swelling. No visible canal hematoma. Disc levels: There are degenerative joint changes throughout the cervical spine with narrowed joint space and osteophyte formation. Upper chest: Negative. Other: None. IMPRESSION: Small areas of increased density in the anterior right thalamus and in the posterior left thalamus, hemorrhage is not excluded. Consider further evaluation with MRI of brain. Minimal subcutaneous fat and skin swelling over the posterosuperior right anterior skull at the level the superior orbit. No acute fracture or dislocation of maxillofacial bones or cervical spine. Degenerative joint changes of cervical spine. These results will be called to the ordering clinician or representative by the Radiologist Assistant, and communication documented in the PACS or zVision Dashboard. Electronically Signed   By: Abelardo Diesel M.D.   On: 11/10/2017 15:53   Ct Cervical Spine Wo Contrast  Result Date: 11/10/2017 CLINICAL DATA:  Status post fall with blunt maxillofacial trauma. EXAM: CT HEAD WITHOUT CONTRAST CT MAXILLOFACIAL WITHOUT CONTRAST CT CERVICAL SPINE WITHOUT CONTRAST TECHNIQUE: Multidetector CT imaging of the head, cervical spine, and maxillofacial structures were performed using the standard  protocol without intravenous contrast. Multiplanar CT image reconstructions of the cervical spine and maxillofacial structures were also generated. COMPARISON:  None. FINDINGS: CT HEAD FINDINGS Brain: There is chronic diffuse atrophy. Chronic bilateral periventricular white matter small vessel ischemic changes identified. In the anterior right thalamus, posterior left thalamus, there is increased density, hemorrhage is not excluded. There is no midline shift or hydrocephalus. No acute transcortical infarct is identified. Vascular:  No hyperdense vessel or unexpected calcification. Skull: See maxillofacial CT for further report. The calvarium is intact. Other: None. CT MAXILLOFACIAL FINDINGS Osseous: Prior postsurgical change of the left anterior maxillary wall with mucoperiosteal thickening of the left maxillary sinus are noted. Chronic deformity of the nasal bones are noted. There is no definite acute displaced fracture or dislocation. Orbits: Negative. No traumatic or inflammatory finding. Sinuses: Chronic postsurgical change of the anterior left maxillary sinus with mucoperiosteal thickening and a calcified cyst in the left maxillary sinus are identified. Soft tissues: Minimal subcutaneous fat and skin swelling over the posterosuperior right anterior skull at the level of the superior orbit. CT CERVICAL SPINE FINDINGS Alignment: There is straightening of cervical spine. Skull base and vertebrae: No acute fracture. No primary bone lesion or focal pathologic process. Soft tissues and spinal canal: No prevertebral fluid or swelling. No visible canal hematoma. Disc levels: There are degenerative joint changes throughout the cervical spine with narrowed joint space and osteophyte formation. Upper chest: Negative. Other: None. IMPRESSION: Small areas of increased density in the anterior right thalamus and in the posterior left thalamus, hemorrhage is not excluded. Consider further evaluation with MRI of brain. Minimal subcutaneous fat and skin swelling over the posterosuperior right anterior skull at the level the superior orbit. No acute fracture or dislocation of maxillofacial bones or cervical spine. Degenerative joint changes of cervical spine. These results will be called to the ordering clinician or representative by the Radiologist Assistant, and communication documented in the PACS or zVision Dashboard. Electronically Signed   By: Abelardo Diesel M.D.   On: 11/10/2017 15:53   Mr Brain Wo Contrast  Result Date: 11/10/2017 CLINICAL DATA:   Fall.  Hyperdensity in the thalami on CT. EXAM: MRI HEAD WITHOUT CONTRAST TECHNIQUE: Multiplanar, multiecho pulse sequences of the brain and surrounding structures were obtained without intravenous contrast. COMPARISON:  Head CT 11/10/2017 and MRI 08/03/2017 FINDINGS: Brain: There is no evidence of acute infarct, intracranial hemorrhage, mass, midline shift, or extra-axial fluid collection. There is moderate cerebral atrophy. Small foci of cerebral white matter T2 hyperintensity are unchanged from the prior MRI and nonspecific but compatible with mild chronic small vessel ischemic disease. There is susceptibility artifact and mild T1 shortening in the dorsal left thalamus with a smaller focus in the ventral right thalamus corresponding to the increased density on CT. The signal changes are stable from the prior MRI and consistent with calcification rather than hemorrhage. 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 Vascular: Major intracranial vascular flow voids are preserved. Skull and upper cervical spine: Unremarkable bone marrow signal. Sinuses/Orbits: Unremarkable orbits. Chronic left maxillary sinusitis. Trace bilateral mastoid effusions. Other: None. IMPRESSION: 1. No acute intracranial abnormality. 2. 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 No evidence of hemorrhage. Electronically Signed   By: Logan Bores M.D.   On: 11/10/2017 21:31   Ct Maxillofacial Wo Contrast  Result Date: 11/10/2017 CLINICAL DATA:  Status post fall with blunt maxillofacial trauma. EXAM: CT HEAD WITHOUT CONTRAST CT MAXILLOFACIAL WITHOUT CONTRAST CT CERVICAL SPINE WITHOUT CONTRAST TECHNIQUE: Multidetector CT imaging of the head, cervical spine, and maxillofacial  structures were performed using the standard protocol without intravenous contrast. Multiplanar CT image reconstructions of the cervical spine and maxillofacial structures were also generated. COMPARISON:  None. FINDINGS: CT HEAD FINDINGS Brain: There is chronic diffuse atrophy. Chronic bilateral periventricular white matter small vessel ischemic changes identified. In the anterior right thalamus, posterior left thalamus, there is increased density, hemorrhage is not excluded. There is no midline shift or hydrocephalus. No acute transcortical infarct is identified. Vascular: No hyperdense vessel or unexpected calcification. Skull: See maxillofacial CT for further report. The calvarium is intact. Other: None. CT MAXILLOFACIAL FINDINGS Osseous: Prior postsurgical change of the left anterior maxillary wall with mucoperiosteal thickening of the left maxillary sinus are noted. Chronic deformity of the nasal bones are noted. There is no definite acute displaced fracture or dislocation. Orbits: Negative. No traumatic or inflammatory finding. Sinuses: Chronic postsurgical change of the anterior left maxillary sinus with mucoperiosteal thickening and a calcified cyst in the left maxillary sinus are identified. Soft tissues: Minimal subcutaneous fat and skin swelling over the posterosuperior right anterior skull at the level of the superior orbit. CT CERVICAL SPINE FINDINGS Alignment: There is straightening of cervical spine. Skull base and vertebrae: No acute fracture. No primary bone lesion or focal pathologic process. Soft tissues and spinal canal: No prevertebral fluid or swelling. No visible canal hematoma. Disc levels: There are degenerative joint changes throughout the cervical spine with narrowed joint space and osteophyte formation. Upper chest: Negative. Other: None. IMPRESSION: Small areas of increased density in the anterior right thalamus and in the posterior left thalamus, hemorrhage is not excluded. Consider  further evaluation with MRI of brain. Minimal subcutaneous fat and skin swelling over the posterosuperior right anterior skull at the level the superior orbit. No acute fracture or dislocation of maxillofacial bones or cervical spine. Degenerative joint changes of cervical spine. These results will be called to the ordering clinician or representative by the Radiologist Assistant, and communication documented in the PACS or zVision Dashboard. Electronically Signed   By: Abelardo Diesel M.D.   On: 11/10/2017 15:53    Scheduled Meds: . aspirin EC  81 mg Oral Daily  . B-complex with vitamin C  1 tablet Oral Daily  . baclofen  10 mg Oral Daily  . cholecalciferol  2,000 Units Oral Daily  . enoxaparin (LOVENOX) injection  40 mg Subcutaneous Q24H  . escitalopram  20 mg Oral Daily  . folic acid  349 mcg Oral Daily  . latanoprost  1 drop Both Eyes QHS  . lisinopril  20 mg Oral Daily  . rivastigmine  4.6 mg Transdermal Daily  . vitamin B-12  2,500 mcg Oral Daily  . vitamin E  400 Units Oral Daily      Time spent: 25 min  [facility] Pager [phone]. If 7PM-7AM, please contact night-coverage at [email or web address], Office  [phone]  password [email or web address]  11/11/2017, 2:41 PM  LOS: 0 days

## 2017-11-11 NOTE — Discharge Summary (Signed)
Physician Discharge Summary  Matthew Knight DOB: 07-29-1940 DOA: 11/10/2017  PCP: [name], MD  Admit date: 11/10/2017 Discharge date: 11/11/2017  Time spent: 25* minutes  Recommendations for Outpatient Follow-up:  1. Follow-up cardiology as outpatient for event monitor   Discharge Diagnoses:  Principal Problem:   Syncope Active Problems:   Hypertension   Bradycardia   Discharge Condition: Stable  Diet recommendation: Heart healthy diet  Filed Weights   11/10/17 2300  Weight: 98.9 kg    History of present illness:  77 year old male with a history of glaucoma, came to hospital after he had a syncopal episode at home.  Patient apparently fell forward hitting his face.  No seizure activity was documented.  Imaging study showed CT head showed no acute abnormality, MRI brain also showed no evidence of hemorrhage.   Hospital Course:   1.    Syncope-unclear etiology, echocardiogram done today was reviewed by cardiology Dr. Percival Spanish who recommended patient can be discharged home with outpatient event monitor as per cardiology.  Cardiac enzymes troponin every 6 hours x3 have been negative.    2. Bradycardia-heart rate improved, after holding timolol. Timolol will be discontinued, as it is a AV nodal blocking agent.  Family will follow-up with ophthalmologist for alternative medicine for glaucoma currently hold.     3. Hypertension-continue lisinopril, dose of lisinopril has been increased to 40 mg daily  4. ?  Dementia-continue Exelon  5. Glaucoma-continue latanoprost 1 drop nightly both eyes  6. Diabetes mellitus type 2-patient is not on any medications at home.   Procedures:  Echocardiogram  Consultations:  Cardiology  Discharge Exam: Vitals:   11/11/17 0500 11/11/17 1158  BP: 133/63 (!) 154/69  Pulse: 63 (!) 57  Resp: 20 20  Temp: 98.3 F (36.8 C) 97.8 F (36.6 C)  SpO2: 93% 94%    General: Appears in no acute  distress Cardiovascular: S1-S2, regular Respiratory: Clear to auscultation bilaterally  Discharge Instructions   Discharge Instructions    Diet - low sodium heart healthy   Complete by:  As directed    Increase activity slowly   Complete by:  As directed      Allergies as of 11/11/2017      Reactions   Sulfa Antibiotics    Other reaction(s): Other (See Comments) States made him crazy      Medication List    STOP taking these medications   naproxen 375 MG tablet Commonly known as:  NAPROSYN   timolol 0.5 % ophthalmic solution Commonly known as:  TIMOPTIC     TAKE these medications   aspirin EC 81 MG tablet Take 81 mg by mouth daily.   baclofen 10 MG tablet Commonly known as:  LIORESAL Take 10 mg by mouth daily.   escitalopram 20 MG tablet Commonly known as:  LEXAPRO Take 20 mg by mouth daily.   EXELON 4.6 mg/24hr Generic drug:  rivastigmine Place 4.6 mg onto the skin daily.   folic acid 235 MCG tablet Commonly known as:  FOLVITE Take 400 mcg by mouth daily. Reported on 05/06/2015   latanoprost 0.005 % ophthalmic solution Commonly known as:  XALATAN Place 1 drop into both eyes at bedtime.   lisinopril 40 MG tablet Commonly known as:  PRINIVIL,ZESTRIL Take 1 tablet (40 mg total) by mouth daily. Start taking on:  11/12/2017 What changed:    medication strength  how much to take   Vitamin B-12 2500 MCG Subl Take 2,500 mcg by mouth daily.   Vitamin  D3 2000 units capsule Take 2,000 Units by mouth daily.   Vitamin E 400 units Tabs Take 400 Units by mouth daily.   Vitamin-B Complex Tabs Take 1 tablet by mouth daily.      Allergies  Allergen Reactions  . Sulfa Antibiotics     Other reaction(s): Other (See Comments) States made him crazy   Follow-up Information    [facility] Follow up on 11/17/2017.   Specialty:  Cardiology Why:  Please arrive 15 minutes early for your 4:00pm appointment to pick up your heart monitor Contact  information: [address], [facility] [phone]       Minus Breeding, MD Follow up on 12/23/2017.   Specialty:  Cardiology Why:  Please arrive 15 minutes early for your cardiology appointment. You will discuss the results of your heart monitor at this visit.  Contact information: [address] [place] [address] [place], [facility] Follow up.   Specialty:  Home Health Services Why:  Physical Therapy, Occupational Therapy and  Aide- office will call you with a time to schedule visit.  Contact information: [address] [place] [address] [phone]            The results of significant diagnostics from this hospitalization (including imaging, microbiology, ancillary and laboratory) are listed below for reference.    Significant Diagnostic Studies: Dg Chest 2 View  Result Date: 11/11/2017 CLINICAL DATA:  Syncope 2 days ago, diabetes mellitus EXAM: CHEST - 2 VIEW COMPARISON:  01/07/2010 FINDINGS: Lordotic positioning. Normal heart size, mediastinal contours, and pulmonary vascularity. Scarring question calcified plaques in the mid lungs bilaterally. No acute infiltrate, pleural effusion or pneumothorax. Bones demineralized with postsurgical changes of ORIF proximal RIGHT humerus, incompletely visualized. IMPRESSION: Scarring in the mid lungs bilaterally suspect calcified plaques, question asbestos exposure. No acute infiltrate. Electronically Signed   By: Lavonia Dana M.D.   On: 11/11/2017 08:29   Ct Head Wo Contrast  Result Date: 11/10/2017 CLINICAL DATA:  Status post fall with blunt maxillofacial trauma. EXAM: CT HEAD WITHOUT CONTRAST CT MAXILLOFACIAL WITHOUT CONTRAST CT CERVICAL SPINE WITHOUT CONTRAST TECHNIQUE: Multidetector CT imaging of the head, cervical spine, and maxillofacial structures were performed using the standard protocol without intravenous contrast. Multiplanar  CT image reconstructions of the cervical spine and maxillofacial structures were also generated. COMPARISON:  None. FINDINGS: CT HEAD FINDINGS Brain: There is chronic diffuse atrophy. Chronic bilateral periventricular white matter small vessel ischemic changes identified. In the anterior right thalamus, posterior left thalamus, there is increased density, hemorrhage is not excluded. There is no midline shift or hydrocephalus. No acute transcortical infarct is identified. Vascular: No hyperdense vessel or unexpected calcification. Skull: See maxillofacial CT for further report. The calvarium is intact. Other: None. CT MAXILLOFACIAL FINDINGS Osseous: Prior postsurgical change of the left anterior maxillary wall with mucoperiosteal thickening of the left maxillary sinus are noted. Chronic deformity of the nasal bones are noted. There is no definite acute displaced fracture or dislocation. Orbits: Negative. No traumatic or inflammatory finding. Sinuses: Chronic postsurgical change of the anterior left maxillary sinus with mucoperiosteal thickening and a calcified cyst in the left maxillary sinus are identified. Soft tissues: Minimal subcutaneous fat and skin swelling over the posterosuperior right anterior skull at the level of the superior orbit. CT CERVICAL SPINE FINDINGS Alignment: There is straightening of cervical spine. Skull base and vertebrae: No acute fracture. No primary bone lesion  or focal pathologic process. Soft tissues and spinal canal: No prevertebral fluid or swelling. No visible canal hematoma. Disc levels: There are degenerative joint changes throughout the cervical spine with narrowed joint space and osteophyte formation. Upper chest: Negative. Other: None. IMPRESSION: Small areas of increased density in the anterior right thalamus and in the posterior left thalamus, hemorrhage is not excluded. Consider further evaluation with MRI of brain. Minimal subcutaneous fat and skin swelling over the  posterosuperior right anterior skull at the level the superior orbit. No acute fracture or dislocation of maxillofacial bones or cervical spine. Degenerative joint changes of cervical spine. These results will be called to the ordering clinician or representative by the Radiologist Assistant, and communication documented in the PACS or zVision Dashboard. Electronically Signed   By: Abelardo Diesel M.D.   On: 11/10/2017 15:53   Ct Cervical Spine Wo Contrast  Result Date: 11/10/2017 CLINICAL DATA:  Status post fall with blunt maxillofacial trauma. EXAM: CT HEAD WITHOUT CONTRAST CT MAXILLOFACIAL WITHOUT CONTRAST CT CERVICAL SPINE WITHOUT CONTRAST TECHNIQUE: Multidetector CT imaging of the head, cervical spine, and maxillofacial structures were performed using the standard protocol without intravenous contrast. Multiplanar CT image reconstructions of the cervical spine and maxillofacial structures were also generated. COMPARISON:  None. FINDINGS: CT HEAD FINDINGS Brain: There is chronic diffuse atrophy. Chronic bilateral periventricular white matter small vessel ischemic changes identified. In the anterior right thalamus, posterior left thalamus, there is increased density, hemorrhage is not excluded. There is no midline shift or hydrocephalus. No acute transcortical infarct is identified. Vascular: No hyperdense vessel or unexpected calcification. Skull: See maxillofacial CT for further report. The calvarium is intact. Other: None. CT MAXILLOFACIAL FINDINGS Osseous: Prior postsurgical change of the left anterior maxillary wall with mucoperiosteal thickening of the left maxillary sinus are noted. Chronic deformity of the nasal bones are noted. There is no definite acute displaced fracture or dislocation. Orbits: Negative. No traumatic or inflammatory finding. Sinuses: Chronic postsurgical change of the anterior left maxillary sinus with mucoperiosteal thickening and a calcified cyst in the left maxillary sinus are  identified. Soft tissues: Minimal subcutaneous fat and skin swelling over the posterosuperior right anterior skull at the level of the superior orbit. CT CERVICAL SPINE FINDINGS Alignment: There is straightening of cervical spine. Skull base and vertebrae: No acute fracture. No primary bone lesion or focal pathologic process. Soft tissues and spinal canal: No prevertebral fluid or swelling. No visible canal hematoma. Disc levels: There are degenerative joint changes throughout the cervical spine with narrowed joint space and osteophyte formation. Upper chest: Negative. Other: None. IMPRESSION: Small areas of increased density in the anterior right thalamus and in the posterior left thalamus, hemorrhage is not excluded. Consider further evaluation with MRI of brain. Minimal subcutaneous fat and skin swelling over the posterosuperior right anterior skull at the level the superior orbit. No acute fracture or dislocation of maxillofacial bones or cervical spine. Degenerative joint changes of cervical spine. These results will be called to the ordering clinician or representative by the Radiologist Assistant, and communication documented in the PACS or zVision Dashboard. Electronically Signed   By: Abelardo Diesel M.D.   On: 11/10/2017 15:53   Mr Brain Wo Contrast  Result Date: 11/10/2017 CLINICAL DATA:  Fall.  Hyperdensity in the thalami on CT. EXAM: MRI HEAD WITHOUT CONTRAST TECHNIQUE: Multiplanar, multiecho pulse sequences of the brain and surrounding structures were obtained without intravenous contrast. COMPARISON:  Head CT 11/10/2017 and MRI 08/03/2017 FINDINGS: Brain: There is no evidence of acute  infarct, intracranial hemorrhage, mass, midline shift, or extra-axial fluid collection. There is moderate cerebral atrophy. Small foci of cerebral white matter T2 hyperintensity are unchanged from the prior MRI and nonspecific but compatible with mild chronic small vessel ischemic disease. There is susceptibility  artifact and mild T1 shortening in the dorsal left thalamus with a smaller focus in the ventral right thalamus corresponding to the increased density on CT. The signal changes are stable from the prior MRI and consistent with calcification rather than hemorrhage. 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 Vascular: Major intracranial vascular flow voids are preserved. Skull and upper cervical spine: Unremarkable bone marrow signal. Sinuses/Orbits: Unremarkable orbits. Chronic left maxillary sinusitis. Trace bilateral mastoid effusions. Other: None. IMPRESSION: 1. No acute intracranial abnormality. 2. 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 No evidence of hemorrhage. Electronically Signed   By: Logan Bores M.D.   On: 11/10/2017 21:31   Ct Maxillofacial Wo Contrast  Result Date: 11/10/2017 CLINICAL DATA:  Status post fall with blunt maxillofacial trauma. EXAM: CT HEAD WITHOUT CONTRAST CT MAXILLOFACIAL WITHOUT CONTRAST CT CERVICAL SPINE WITHOUT CONTRAST TECHNIQUE: Multidetector CT imaging of the head, cervical spine, and maxillofacial structures were performed using the standard protocol without intravenous contrast. Multiplanar CT image reconstructions of the cervical spine and maxillofacial structures were also generated. COMPARISON:  None. FINDINGS: CT HEAD FINDINGS Brain: There is chronic diffuse atrophy. Chronic bilateral periventricular white matter small vessel ischemic changes identified. In the anterior right thalamus, posterior left thalamus, there is increased density, hemorrhage is not excluded. There is no midline shift or hydrocephalus. No acute transcortical infarct is identified. Vascular: No hyperdense vessel or unexpected  calcification. Skull: See maxillofacial CT for further report. The calvarium is intact. Other: None. CT MAXILLOFACIAL FINDINGS Osseous: Prior postsurgical change of the left anterior maxillary wall with mucoperiosteal thickening of the left maxillary sinus are noted. Chronic deformity of the nasal bones are noted. There is no definite acute displaced fracture or dislocation. Orbits: Negative. No traumatic or inflammatory finding. Sinuses: Chronic postsurgical change of the anterior left maxillary sinus with mucoperiosteal thickening and a calcified cyst in the left maxillary sinus are identified. Soft tissues: Minimal subcutaneous fat and skin swelling over the posterosuperior right anterior skull at the level of the superior orbit. CT CERVICAL SPINE FINDINGS Alignment: There is straightening of cervical spine. Skull base and vertebrae: No acute fracture. No primary bone lesion or focal pathologic process. Soft tissues and spinal canal: No prevertebral fluid or swelling. No visible canal hematoma. Disc levels: There are degenerative joint changes throughout the cervical spine with narrowed joint space and osteophyte formation. Upper chest: Negative. Other: None. IMPRESSION: Small areas of increased density in the anterior right thalamus and in the posterior left thalamus, hemorrhage is not excluded. Consider further evaluation with MRI of brain. Minimal subcutaneous fat and skin swelling over the posterosuperior right anterior skull at the level the superior orbit. No acute fracture or dislocation of maxillofacial bones or cervical spine. Degenerative joint changes of cervical spine. These results will be called to the ordering clinician or representative by the Radiologist Assistant, and communication documented in the PACS or zVision Dashboard. Electronically Signed   By: Abelardo Diesel M.D.   On: 11/10/2017 15:53    Microbiology: No results found for this or any previous visit (from  the past 240 hour(s)).    Labs: Basic Metabolic Panel: Recent Labs  Lab 11/10/17 1545 11/11/17 0543  NA 142 144  K 4.4 3.9  CL 106 108  CO2 25 26  GLUCOSE 110* 114*  BUN 16 13  CREATININE 1.09 1.06  CALCIUM 9.1 9.1   Liver Function Tests: Recent Labs  Lab 11/10/17 1545 11/11/17 0543  AST 41 30  ALT 36 30  ALKPHOS 73 65  BILITOT 1.0 0.9  PROT 6.5 6.3*  ALBUMIN 3.8 3.6   No results for input(s): LIPASE, AMYLASE in the last 168 hours. No results for input(s): AMMONIA in the last 168 hours. CBC: Recent Labs  Lab 11/10/17 1545 11/11/17 0543  WBC 13.9* 15.3*  NEUTROABS 11.0*  --   HGB 16.7 15.4  HCT 50.8 46.4  MCV 91.0 90.3  PLT 336 330   Cardiac Enzymes: Recent Labs  Lab 11/10/17 1545 11/10/17 2339 11/11/17 0543 11/11/17 1414  TROPONINI <0.03 <0.03 <0.03 <0.03   BNP:  CBG: Recent Labs  Lab 11/11/17 0108 11/11/17 0742 11/11/17 1156  GLUCAP 111* 99 117*       Signed:  [name] MD.  [facility] 11/11/2017, 4:54 PM

## 2017-11-11 NOTE — Progress Notes (Signed)
2D Echocardiogram has been performed.  Matthew Knight 11/11/2017, 10:46 AM

## 2017-11-11 NOTE — Care Management Note (Signed)
Case Management Note  Patient Details  Name: Matthew Knight MRN: [id number] Date of Birth: 1941/01/16  Subjective/Objective: Pt presented for syncopal episode. PTA independent from home with spouse. PT/OT recommendations for [facility] PT/OT- pt is agreeable and an aide has been added.  MD- please write orders for [facility] PT/OT/ Aide with F2F.                   Action/Plan: Agency list provided and family chose [facility]- Referral sent to [facility] with [facility] and SOC to begin within 24-48 hours post transition home. Pt declines RW at this time. PT can assess once he gets home to see if needs. No further needs from CM at this time.   Expected Discharge Date:                  Expected Discharge Plan:  [place]  In-House Referral:  NA  Discharge planning Services  CM Consult  Post Acute Care Choice:  Home Health Choice offered to:  Patient, Spouse  DME Arranged:  N/A(Declined RW- pt states he's tough. PT to assess at home. ) DME Agency:  NA  HH Arranged:  PT, OT, Nurse's Aide [place] Agency:  [place]  Status of Service:  Completed, signed off  If discussed at [place] of Stay Meetings, dates discussed:    Additional Comments:  Bethena Roys, RN 11/11/2017, 3:39 PM

## 2017-11-17 ENCOUNTER — Ambulatory Visit (INDEPENDENT_AMBULATORY_CARE_PROVIDER_SITE_OTHER): Payer: Medicare Other

## 2017-11-17 DIAGNOSIS — R55 Syncope and collapse: Secondary | ICD-10-CM

## 2017-12-23 ENCOUNTER — Ambulatory Visit: Payer: Medicare Other | Admitting: Cardiology

## 2017-12-29 ENCOUNTER — Ambulatory Visit (INDEPENDENT_AMBULATORY_CARE_PROVIDER_SITE_OTHER): Payer: Medicare Other | Admitting: Podiatry

## 2017-12-29 DIAGNOSIS — B353 Tinea pedis: Secondary | ICD-10-CM | POA: Diagnosis not present

## 2017-12-29 DIAGNOSIS — M79676 Pain in unspecified toe(s): Secondary | ICD-10-CM | POA: Diagnosis not present

## 2017-12-29 DIAGNOSIS — B351 Tinea unguium: Secondary | ICD-10-CM | POA: Diagnosis not present

## 2017-12-29 MED ORDER — KETOCONAZOLE 2 % EX CREA: 1.0000 "application " | TOPICAL_CREAM | Freq: Every day | CUTANEOUS | 0 refills | Status: DC

## 2018-01-17 ENCOUNTER — Encounter: Payer: Self-pay | Admitting: Podiatry

## 2018-01-17 NOTE — Progress Notes (Signed)
Subjective: Matthew Knight presents today with  cc of painful, discolored, thick toenails which interfere with daily activities and routine tasks.  Pain is aggravated when wearing enclosed shoe gear. Pain is getting progressively worse and relieved with periodic professional debridement.  Objective: Vascular Examination: Capillary refill time <3 seconds x 10 digits Dorsalis pedis and posterior tibial pulses present 2/4 b/l No digital hair x 10 digits Skin temperature warm to warm b/l  Dermatological Examination: Skin thin and atrophic b/l Toenails 1-5 b/l discolored, thick, dystrophic with subungual debris and pain with palpation to nailbeds due to thickness of nails. No open wounds No interdigital macerations Diffuse scaling of both feet with yellow discoloration and mild foot odor consistent with tinea pedis b/l  Musculoskeletal: Muscle strength 5/5 to all LE muscle groups  Neurological: Sensation intact with 10 gram monofilament. Vibratory sensation intact.  Assessment: 1. Painful onychomycosis toenails 1-5 b/l  2. Tinea pedis  Plan: 1. Toenails 1-5 b/l were debrided in length and girth without iatrogenic bleeding. 2. Rx sent for Ketoconazole Cream 2% to be applied to both feet and between toes once daily for 6 weeks 3. Patient to continue soft, supportive shoe gear 4. Patient to report any pedal injuries to medical professional immediately. 5. Follow up 3 months. Patient/POA to call should there be a concern in the interim.

## 2018-03-30 ENCOUNTER — Ambulatory Visit: Payer: Medicare Other | Admitting: Podiatry

## 2018-03-31 ENCOUNTER — Ambulatory Visit: Payer: Medicare Other | Admitting: Podiatry

## 2018-05-01 ENCOUNTER — Ambulatory Visit: Payer: Medicare Other | Admitting: Podiatry

## 2018-05-10 ENCOUNTER — Ambulatory Visit (INDEPENDENT_AMBULATORY_CARE_PROVIDER_SITE_OTHER): Payer: Medicare Other | Admitting: Podiatry

## 2018-05-10 DIAGNOSIS — M79674 Pain in right toe(s): Secondary | ICD-10-CM | POA: Diagnosis not present

## 2018-05-10 DIAGNOSIS — M79675 Pain in left toe(s): Secondary | ICD-10-CM | POA: Diagnosis not present

## 2018-05-10 DIAGNOSIS — B351 Tinea unguium: Secondary | ICD-10-CM

## 2018-05-10 NOTE — Patient Instructions (Signed)
Onychomycosis/Fungal Toenails  WHAT IS IT? An infection that lies within the keratin of your nail plate that is caused by a fungus.  WHY ME? Fungal infections affect all ages, sexes, races, and creeds.  There may be many factors that predispose you to a fungal infection such as age, coexisting medical conditions such as diabetes, or an autoimmune disease; stress, medications, fatigue, genetics, etc.  Bottom line: fungus thrives in a warm, moist environment and your shoes offer such a location.  IS IT CONTAGIOUS? Theoretically, yes.  You do not want to share shoes, nail clippers or files with someone who has fungal toenails.  Walking around barefoot in the same room or sleeping in the same bed is unlikely to transfer the organism.  It is important to realize, however, that fungus can spread easily from one nail to the next on the same foot.  HOW DO WE TREAT THIS?  There are several ways to treat this condition.  Treatment may depend on many factors such as age, medications, pregnancy, liver and kidney conditions, etc.  It is best to ask your doctor which options are available to you.  1. No treatment.   Unlike many other medical concerns, you can live with this condition.  However for many people this can be a painful condition and may lead to ingrown toenails or a bacterial infection.  It is recommended that you keep the nails cut short to help reduce the amount of fungal nail. 2. Topical treatment.  These range from herbal remedies to prescription strength nail lacquers.  About 40-50% effective, topicals require twice daily application for approximately 9 to 12 months or until an entirely new nail has grown out.  The most effective topicals are medical grade medications available through physicians offices. 3. Oral antifungal medications.  With an 80-90% cure rate, the most common oral medication requires 3 to 4 months of therapy and stays in your system for a year as the new nail grows out.  Oral  antifungal medications do require blood work to make sure it is a safe drug for you.  A liver function panel will be performed prior to starting the medication and after the first month of treatment.  It is important to have the blood work performed to avoid any harmful side effects.  In general, this medication safe but blood work is required. 4. Laser Therapy.  This treatment is performed by applying a specialized laser to the affected nail plate.  This therapy is noninvasive, fast, and non-painful.  It is not covered by insurance and is therefore, out of pocket.  The results have been very good with a 80-95% cure rate.  [facility] is the only practice in the area to offer this therapy. 5. Permanent Nail Avulsion.  Removing the entire nail so that a new nail will not grow back.  Athlete's Foot  Athlete's foot (tinea pedis) is a fungal infection of the skin on your feet. It often occurs on the skin that is between or underneath the toes. It can also occur on the soles of your feet. The infection can spread from person to person (is contagious). It can also spread when a person's bare feet come in contact with the fungus on shower floors or on items such as shoes. What are the causes? This condition is caused by a fungus that grows in warm, moist places. You can get athlete's foot by sharing shoes, shower stalls, towels, and wet floors with someone who is   infected. Not washing your feet or changing your socks often enough can also lead to athlete's foot. What increases the risk? This condition is more likely to develop in:  Men.  People who have a weak body defense system (immune system).  People who have diabetes.  People who use public showers, such as at a gym.  People who wear heavy-duty shoes, such as industrial or military shoes.  Seasons with warm, humid weather. What are the signs or symptoms? Symptoms of this condition include:  Itchy areas between your toes or on the  soles of your feet.  White, flaky, or scaly areas between your toes or on the soles of your feet.  Very itchy small blisters between your toes or on the soles of your feet.  Small cuts in your skin. These cuts can become infected.  Thick or discolored toenails. How is this diagnosed? This condition may be diagnosed with a physical exam and a review of your medical history. Your health care provider may also take a skin or toenail sample to examine under a microscope. How is this treated? This condition is treated with antifungal medicines. These may be applied as powders, ointments, or creams. In severe cases, an oral antifungal medicine may be given. Follow these instructions at home: Medicines  Apply or take over-the-counter and prescription medicines only as told by your health care provider.  Apply your antifungal medicine as told by your health care provider. Do not stop using the antifungal even if your condition improves. Foot care  Do not scratch your feet.  Keep your feet dry: ? Wear cotton or wool socks. Change your socks every day or if they become wet. ? Wear shoes that allow air to flow, such as sandals or canvas tennis shoes.  Wash and dry your feet, including the area between your toes. Also, wash and dry your feet: ? Every day or as told by your health care provider. ? After exercising. General instructions  Do not let others use towels, shoes, nail clippers, or other personal items that touch your feet.  Protect your feet by wearing sandals in wet areas, such as locker rooms and shared showers.  Keep all follow-up visits as told by your health care provider. This is important.  If you have diabetes, keep your blood sugar under control. Contact a health care provider if:  You have a fever.  You have swelling, soreness, warmth, or redness in your foot.  Your feet are not getting better with treatment.  Your symptoms get worse.  You have new symptoms.  Summary  Athlete's foot (tinea pedis) is a fungal infection of the skin on your feet. It often occurs on skin that is between or underneath the toes.  This condition is caused by a fungus that grows in warm, moist places.  Symptoms include white, flaky, or scaly areas between your toes or on the soles of your feet.  This condition is treated with antifungal medicines.  Keep your feet clean. Always dry them thoroughly. This information is not intended to replace advice given to you by your health care provider. Make sure you discuss any questions you have with your health care provider. Document Released: 02/20/2000 Document Revised: 12/13/2016 Document Reviewed: 12/13/2016 Elsevier Interactive Patient Education  2019 Elsevier Inc.  

## 2018-05-17 ENCOUNTER — Encounter: Payer: Self-pay | Admitting: Podiatry

## 2018-05-17 NOTE — Progress Notes (Signed)
Subjective: Royetta Asal presents today with painful, thick toenails 1-5 b/l that he cannot cut and which interfere with daily activities.  Pain is aggravated when wearing enclosed shoe gear.  [name], MD is his PCP.    Current Outpatient Medications:  .  aspirin EC 81 MG tablet, Take 81 mg by mouth daily. , Disp: , Rfl:  .  B Complex Vitamins (VITAMIN-B COMPLEX) TABS, Take 1 tablet by mouth daily. , Disp: , Rfl:  .  baclofen (LIORESAL) 10 MG tablet, Take 10 mg by mouth daily. , Disp: , Rfl:  .  Cholecalciferol (VITAMIN D3) 2000 units capsule, Take 2,000 Units by mouth daily. , Disp: , Rfl:  .  Cyanocobalamin (VITAMIN B-12) 2500 MCG SUBL, Take 2,500 mcg by mouth daily. , Disp: , Rfl:  .  escitalopram (LEXAPRO) 20 MG tablet, Take 20 mg by mouth daily. , Disp: , Rfl:  .  folic acid (FOLVITE) 633 MCG tablet, Take 400 mcg by mouth daily. Reported on 05/06/2015, Disp: , Rfl:  .  ketoconazole (NIZORAL) 2 % cream, Apply 1 application topically daily. Between the toes and to the affected area, Disp: 30 g, Rfl: 0 .  latanoprost (XALATAN) 0.005 % ophthalmic solution, Place 1 drop into both eyes at bedtime. , Disp: , Rfl:  .  lisinopril (PRINIVIL,ZESTRIL) 20 MG tablet, , Disp: , Rfl:  .  lisinopril (PRINIVIL,ZESTRIL) 40 MG tablet, Take 1 tablet (40 mg total) by mouth daily., Disp: 30 tablet, Rfl: 2 .  rivastigmine (EXELON) 4.6 mg/24hr, Place 4.6 mg onto the skin daily. , Disp: , Rfl:  .  Vitamin E 400 units TABS, Take 400 Units by mouth daily. , Disp: , Rfl:   Allergies  Allergen Reactions  . Sulfa Antibiotics     Other reaction(s): Other (See Comments) States made him crazy    Objective:  Vascular Examination: Capillary refill time <3 seconds x 10 digits.  Dorsalis pedis and Posterior tibial pulses palpable b/l.  Digital hair absent x 10 digits.  Skin temperature gradient WNL b/l .  Dermatological Examination: Skin thin and atrophic b/l.  Toenails 1-5 b/l discolored,  thick, dystrophic with subungual debris and pain with palpation to nailbeds due to thickness of nails.  No open wounds b/l.  No interdigital macerations b/l.  Musculoskeletal: Muscle strength 5/5 to all LE muscle groups  No gross bony deformities b/l.  No pain, crepitus or joint limitation noted with ROM.   Neurological: Sensation intact with 10 gram monofilament. Vibratory sensation intact.  Assessment: Painful onychomycosis toenails 1-5 b/l   Plan: 1. Toenails 1-5 b/l were debrided in length and girth without iatrogenic bleeding. 2. Patient to continue soft, supportive shoe gear. 3. Patient to report any pedal injuries to medical professional immediately. 4. Follow up 3 months. 5. Patient/POA to call should there be a concern in the interim.

## 2018-08-17 ENCOUNTER — Ambulatory Visit: Payer: Medicare Other | Admitting: Podiatry

## 2018-08-23 ENCOUNTER — Ambulatory Visit (INDEPENDENT_AMBULATORY_CARE_PROVIDER_SITE_OTHER): Payer: Medicare Other | Admitting: Podiatry

## 2018-08-23 ENCOUNTER — Other Ambulatory Visit: Payer: Self-pay

## 2018-08-23 ENCOUNTER — Encounter: Payer: Self-pay | Admitting: Podiatry

## 2018-08-23 VITALS — Temp 97.3°F

## 2018-08-23 DIAGNOSIS — M79674 Pain in right toe(s): Secondary | ICD-10-CM

## 2018-08-23 DIAGNOSIS — B351 Tinea unguium: Secondary | ICD-10-CM

## 2018-08-23 DIAGNOSIS — M79675 Pain in left toe(s): Secondary | ICD-10-CM | POA: Diagnosis not present

## 2018-08-23 NOTE — Patient Instructions (Signed)

## 2018-08-23 NOTE — Progress Notes (Signed)
Subjective: "I want to get back home to my puppy."  Matthew Knight presents to clinic today, accompanied by his wife for management ainful, thick, discolored, elongated toenails 1-5 b/l that become tender and cannot cut because of thickness.  Pain is aggravated when wearing enclosed shoe gear.  [name], MD is his PCP and last visit was 05/17/2018.   Current Outpatient Medications:  .  aspirin EC 81 MG tablet, Take 81 mg by mouth daily. , Disp: , Rfl:  .  B Complex Vitamins (VITAMIN-B COMPLEX) TABS, Take 1 tablet by mouth daily. , Disp: , Rfl:  .  baclofen (LIORESAL) 10 MG tablet, Take 10 mg by mouth daily. , Disp: , Rfl:  .  Cholecalciferol (VITAMIN D3) 2000 units capsule, Take 2,000 Units by mouth daily. , Disp: , Rfl:  .  Cyanocobalamin (VITAMIN B-12) 2500 MCG SUBL, Take 2,500 mcg by mouth daily. , Disp: , Rfl:  .  escitalopram (LEXAPRO) 20 MG tablet, Take 20 mg by mouth daily. , Disp: , Rfl:  .  folic acid (FOLVITE) 768 MCG tablet, Take 400 mcg by mouth daily. Reported on 05/06/2015, Disp: , Rfl:  .  ketoconazole (NIZORAL) 2 % cream, Apply 1 application topically daily. Between the toes and to the affected area, Disp: 30 g, Rfl: 0 .  latanoprost (XALATAN) 0.005 % ophthalmic solution, Place 1 drop into both eyes at bedtime. , Disp: , Rfl:  .  lisinopril (PRINIVIL,ZESTRIL) 20 MG tablet, , Disp: , Rfl:  .  lisinopril (PRINIVIL,ZESTRIL) 40 MG tablet, Take 1 tablet (40 mg total) by mouth daily., Disp: 30 tablet, Rfl: 2 .  rivastigmine (EXELON) 4.6 mg/24hr, Place 4.6 mg onto the skin daily. , Disp: , Rfl:  .  Vitamin E 400 units TABS, Take 400 Units by mouth daily. , Disp: , Rfl:    Allergies  Allergen Reactions  . Sulfa Antibiotics     Other reaction(s): Other (See Comments) States made him crazy     Objective: Vitals:   08/23/18 0938  Temp: (!) 97.3 F (36.3 C)    Physical Examination:  Vascular Examination: Capillary refill time <3 seconds x 10 digits.  Palpable  DP/PT pulses b/l.  Digital hair absent b/l.  No edema noted b/l.  Skin temperature gradient WNL b/l.  Dermatological Examination: Skin with normal turgor, texture and tone b/l.  No open wounds b/l.  No interdigital macerations noted b/l.  Elongated, thick, discolored brittle toenails with subungual debris and pain on dorsal palpation of nailbeds left great toe and  2-5 b/l.  Anonychia right great toe(s) with evidence of permanent total nail avulsion. Nailbed(s) completely epithelialized and intact.  Musculoskeletal Examination: Muscle strength 5/5 to all muscle groups b/l.  No pain, crepitus or joint discomfort with active/passive ROM.  Neurological Examination: Sensation intact 5/5 b/l with 10 gram monofilament.  Vibratory sensation intact b/l.  Proprioceptive sensation intact b/l.  Assessment: Mycotic nail infection with pain 1-5 b/l  Plan: 1. Toenails 1-5 b/l were debrided in length and girth without iatrogenic laceration. 2.  Continue soft, supportive shoe gear daily. 3.  Report any pedal injuries to medical professional. 4.  Follow up 3 months. 5.  Patient/POA to call should there be a question/concern in there interim.

## 2018-10-14 ENCOUNTER — Encounter (HOSPITAL_COMMUNITY): Payer: Self-pay

## 2018-10-14 ENCOUNTER — Other Ambulatory Visit: Payer: Self-pay

## 2018-10-14 ENCOUNTER — Emergency Department (HOSPITAL_COMMUNITY)
Admission: EM | Admit: 2018-10-14 | Discharge: 2018-10-14 | Disposition: A | Discharge status: Home / Self Care | Payer: Medicare Other | Attending: Emergency Medicine | Admitting: Emergency Medicine

## 2018-10-14 DIAGNOSIS — E119 Type 2 diabetes mellitus without complications: Secondary | ICD-10-CM | POA: Diagnosis not present

## 2018-10-14 DIAGNOSIS — I10 Essential (primary) hypertension: Secondary | ICD-10-CM | POA: Diagnosis not present

## 2018-10-14 DIAGNOSIS — S01112A Laceration without foreign body of left eyelid and periocular area, initial encounter: Secondary | ICD-10-CM | POA: Diagnosis present

## 2018-10-14 DIAGNOSIS — S0181XA Laceration without foreign body of other part of head, initial encounter: Secondary | ICD-10-CM

## 2018-10-14 DIAGNOSIS — Y998 Other external cause status: Secondary | ICD-10-CM | POA: Diagnosis not present

## 2018-10-14 DIAGNOSIS — Z79899 Other long term (current) drug therapy: Secondary | ICD-10-CM | POA: Insufficient documentation

## 2018-10-14 DIAGNOSIS — F17209 Nicotine dependence, unspecified, with unspecified nicotine-induced disorders: Secondary | ICD-10-CM | POA: Diagnosis not present

## 2018-10-14 DIAGNOSIS — Z7982 Long term (current) use of aspirin: Secondary | ICD-10-CM | POA: Diagnosis not present

## 2018-10-14 DIAGNOSIS — Y9389 Activity, other specified: Secondary | ICD-10-CM | POA: Diagnosis not present

## 2018-10-14 DIAGNOSIS — Y929 Unspecified place or not applicable: Secondary | ICD-10-CM | POA: Insufficient documentation

## 2018-10-14 DIAGNOSIS — W06XXXA Fall from bed, initial encounter: Secondary | ICD-10-CM | POA: Insufficient documentation

## 2018-10-14 MED ORDER — LIDOCAINE-EPINEPHRINE (PF) 2 %-1:200000 IJ SOLN
INTRAMUSCULAR | Status: AC
  Administered 2018-10-14: 04:00:00
  Filled 2018-10-14: qty 10

## 2018-10-14 NOTE — ED Triage Notes (Signed)
Pt reports a mechanical fall where he hit his head. No LOC. 4cm laceration noted above L eyebrow. Pt HOH. No thinners. Bleeding controlled.

## 2018-10-14 NOTE — ED Notes (Signed)
Matthew Knight, son, would like to be contacted when pt is roomed so that he may come back. [phone].

## 2018-10-14 NOTE — ED Provider Notes (Signed)
[facility] DEPT Provider Note  CSN: [id number] Arrival date & time: 10/14/18 9357  Chief Complaint(s) Fall and Laceration  HPI Matthew Knight is a 78 y.o. male with a past medical history listed below who presents to the emergency department for laceration to the left eyebrow after rolling out of bed and hitting his nightstand.  This occurred just prior to arrival.  Bleeding controlled with pressure.  Denies any loss of consciousness.  Denies any current headache, visual disturbance.  No associated pain at this time.  Denies any neck pain, back pain, chest pain, extremity pain, abdominal pain, hip pain.  Denies any other physical complaints.  Up-to-date on tetanus vaccination.  Not on any anticoagulation.  HPI  Past Medical History Past Medical History:  Diagnosis Date  . Diabetes mellitus without complication ([place])   . Hearing loss   . Hepatitis   . Small vessel disease [facility])    Patient Active Problem List   Diagnosis Date Noted  . Hypertension 11/11/2017  . Bradycardia 11/11/2017  . Syncope 11/10/2017  . Onychomycosis 04/19/2017  . Memory loss 01/22/2017  . 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 Hypertension, benign 03/28/2015  . Screening for colon cancer 12/03/2013  . Small vessel disease ([place]) 08/17/2013  . 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 Tobacco use disorder 07/17/2012  . Vitamin D deficiency 01/14/2012  . Sudden visual loss 08/17/2010  . Elevated prostate specific antigen (PSA) 01/07/2010  . Chest pain 02/10/2009  . Skin sensation disturbance 02/10/2009  . Hepatitis 01/23/2009   Home Medication(s) Prior to Admission medications   Medication Sig Start Date End Date Taking? Authorizing Provider  aspirin EC 81 MG tablet Take 81 mg by mouth daily.     [provider]  B Complex Vitamins (VITAMIN-B COMPLEX) TABS Take 1 tablet by  mouth daily.     [provider]  baclofen (LIORESAL) 10 MG tablet Take 10 mg by mouth daily.  03/17/17   [provider]  Cholecalciferol (VITAMIN D3) 2000 units capsule Take 2,000 Units by mouth daily.     [provider]  Cyanocobalamin (VITAMIN B-12) 2500 MCG SUBL Take 2,500 mcg by mouth daily.     [provider]  escitalopram (LEXAPRO) 20 MG tablet Take 20 mg by mouth daily.  03/14/15   [provider]  folic acid (FOLVITE) 017 MCG tablet Take 400 mcg by mouth daily. Reported on 05/06/2015    [provider]  ketoconazole (NIZORAL) 2 % cream Apply 1 application topically daily. Between the toes and to the affected area 12/29/17   [place], Stephani Police, DPM  latanoprost (XALATAN) 0.005 % ophthalmic solution Place 1 drop into both eyes at bedtime.  07/27/17   [provider]  lisinopril (PRINIVIL,ZESTRIL) 20 MG tablet  12/05/17   [provider]  lisinopril (PRINIVIL,ZESTRIL) 40 MG tablet Take 1 tablet (40 mg total) by mouth daily. 11/12/17   [name], MD  rivastigmine (EXELON) 4.6 mg/24hr Place 4.6 mg onto the skin daily.  10/14/14   [provider]  Vitamin E 400 units TABS Take 400 Units by mouth daily.  02/16/13   [provider]  Past Surgical History Past Surgical History:  Procedure Laterality Date  . APPENDECTOMY     Family History Family History  Problem Relation Age of Onset  . Diabetes Mother   . Heart disease Father   . Memory loss Paternal Grandfather     Social History Social History   Tobacco Use  . Smoking status: Current Every Day Smoker    Types: Pipe  . Smokeless tobacco: Never Used  Substance Use Topics  . Alcohol use: No    Alcohol/week: 0.0 standard drinks  . Drug use: No   Allergies Sulfa antibiotics  Review of Systems Review of Systems  All other systems are reviewed and are negative for acute change except as noted in the HPI  Physical Exam Vital Signs  I have reviewed the triage vital signs BP (!) 155/70   Pulse (!) 56   Temp 98.2 F (36.8 C) (Oral)   Resp 16   Ht 6' (1.829 m)   Wt 98.9 kg   SpO2 95%   BMI 29.57 kg/m   Physical Exam Constitutional:      General: He is not in acute distress.    Appearance: He is well-developed. He is not diaphoretic.  HENT:     Head: Normocephalic. Laceration present.      Right Ear: External ear normal.     Left Ear: External ear normal.  Eyes:     General: No scleral icterus.       Right eye: No discharge.        Left eye: No discharge.     Conjunctiva/sclera: Conjunctivae normal.     Pupils: Pupils are equal, round, and reactive to light.  Neck:     Musculoskeletal: Normal range of motion and neck supple.  Cardiovascular:     Rate and Rhythm: Regular rhythm.     Pulses:          Radial pulses are 2+ on the right side and 2+ on the left side.       Dorsalis pedis pulses are 2+ on the right side and 2+ on the left side.     Heart sounds: Normal heart sounds. No murmur. No friction rub. No gallop.   Pulmonary:     Effort: Pulmonary effort is normal. No respiratory distress.     Breath sounds: Normal breath sounds. No stridor.  Abdominal:     General: There is no distension.     Palpations: Abdomen is soft.     Tenderness: There is no abdominal tenderness.  Musculoskeletal:     Cervical back: He exhibits no bony tenderness.     Thoracic back: He exhibits no bony tenderness.     Lumbar back: He exhibits no bony tenderness.     Comments: Clavicle stable. Chest stable to AP/Lat compression. Pelvis stable to Lat compression. No obvious extremity deformity. No chest or abdominal wall contusion.  Skin:    General: Skin is warm.  Neurological:     Mental Status: He is alert and oriented to person, place, and time.     GCS: GCS eye subscore is 4. GCS verbal  subscore is 5. GCS motor subscore is 6.     Comments: Moving all extremities      ED Results and Treatments Labs (all labs ordered are listed, but only abnormal results are displayed) Labs Reviewed - No data to display  EKG  EKG Interpretation  Date/Time:    Ventricular Rate:    PR Interval:    QRS Duration:   QT Interval:    QTC Calculation:   R Axis:     Text Interpretation:        Radiology No results found.  Pertinent labs & imaging results that were available during my care of the patient were reviewed by me and considered in my medical decision making (see chart for details).  Medications Ordered in ED Medications  lidocaine-EPINEPHrine (XYLOCAINE W/EPI) 2 %-1:200000 (PF) injection (  Given by Other 10/14/18 0353)                                                                                                                                    Procedures .Marland KitchenLaceration Repair  Date/Time: 10/14/2018 3:52 AM Performed by: Fatima Blank, MD Authorized by: Fatima Blank, MD   Consent:    Consent obtained:  Verbal   Consent given by:  Patient   Risks discussed:  Need for additional repair, poor cosmetic result, pain and poor wound healing   Alternatives discussed:  Delayed treatment Anesthesia (see MAR for exact dosages):    Anesthesia method:  Local infiltration   Local anesthetic:  Lidocaine 2% WITH epi Laceration details:    Location:  Face   Face location:  L eyebrow   Length (cm):  4.5   Depth (mm):  1 Repair type:    Repair type:  Intermediate Pre-procedure details:    Preparation:  Patient was prepped and draped in usual sterile fashion Exploration:    Hemostasis achieved with:  Direct pressure   Wound exploration: wound explored through full range of motion and entire depth of wound probed and visualized     Wound extent:  no fascia violation noted, no foreign bodies/material noted, no muscle damage noted, no tendon damage noted, no underlying fracture noted and no vascular damage noted     Contaminated: no   Treatment:    Area cleansed with:  Betadine   Amount of cleaning:  Standard   Irrigation solution:  Sterile saline   Irrigation volume:  500cc   Irrigation method:  Pressure wash Subcutaneous repair:    Suture size:  4-0   Suture material:  Vicryl   Suture technique:  Running   Number of sutures:  8 Skin repair:    Repair method:  Sutures   Suture size:  5-0   Suture material:  Prolene   Suture technique:  Running locked   Number of sutures:  11 Approximation:    Approximation:  Close Post-procedure details:    Dressing:  Non-adherent dressing and antibiotic ointment   Patient tolerance of procedure:  Tolerated well, no immediate complications    (including critical care time)  Medical Decision Making / ED Course I have reviewed the nursing notes for this encounter and the patient's prior records (if available in EHR  or on provided paperwork).   Matthew Knight was evaluated in Emergency Department on 10/14/2018 for the symptoms described in the history of present illness. He was evaluated in the context of the global COVID-19 pandemic, which necessitated consideration that the patient might be at risk for infection with the SARS-CoV-2 virus that causes COVID-19. Institutional protocols and algorithms that pertain to the evaluation of patients at risk for COVID-19 are in a state of rapid change based on information released by regulatory bodies including the CDC and federal and state organizations. These policies and algorithms were followed during the patient's care in the ED.  Minor head trauma resulting in left eyebrow laceration.  Wound was thoroughly irrigated and closed as above.  No need for imaging at this time.  Return in 5 to 7 days for suture removal.  The patient appears  reasonably screened and/or stabilized for discharge and I doubt any other medical condition or other [facility] requiring further screening, evaluation, or treatment in the ED at this time prior to discharge.  The patient is safe for discharge with strict return precautions.       Final Clinical Impression(s) / ED Diagnoses Final diagnoses:  Facial laceration, initial encounter     The patient appears reasonably screened and/or stabilized for discharge and I doubt any other medical condition or other [facility] requiring further screening, evaluation, or treatment in the ED at this time prior to discharge.  Disposition: Discharge  Condition: Good  I have discussed the results, Dx and Tx plan with the patient who expressed understanding and agree(s) with the plan. Discharge instructions discussed at great length. The patient was given strict return precautions who verbalized understanding of the instructions. No further questions at time of discharge.    ED Discharge Orders    None      Follow Up: [facility] DEPT [place] [id number] [place] [phone]  in 5-7 days, For suture removal     This chart was dictated using voice recognition software.  Despite best efforts to proofread,  errors can occur which can change the documentation meaning.   Fatima Blank, MD 10/14/18 [phone]

## 2018-10-20 ENCOUNTER — Emergency Department (HOSPITAL_COMMUNITY)
Admission: EM | Admit: 2018-10-20 | Discharge: 2018-10-20 | Disposition: A | Discharge status: Home / Self Care | Payer: Medicare Other | Attending: Emergency Medicine | Admitting: Emergency Medicine

## 2018-10-20 ENCOUNTER — Other Ambulatory Visit: Payer: Self-pay

## 2018-10-20 ENCOUNTER — Encounter (HOSPITAL_COMMUNITY): Payer: Self-pay

## 2018-10-20 ENCOUNTER — Emergency Department (HOSPITAL_COMMUNITY): Payer: Medicare Other

## 2018-10-20 DIAGNOSIS — E119 Type 2 diabetes mellitus without complications: Secondary | ICD-10-CM | POA: Insufficient documentation

## 2018-10-20 DIAGNOSIS — F17290 Nicotine dependence, other tobacco product, uncomplicated: Secondary | ICD-10-CM | POA: Insufficient documentation

## 2018-10-20 DIAGNOSIS — Z4802 Encounter for removal of sutures: Secondary | ICD-10-CM

## 2018-10-20 DIAGNOSIS — I10 Essential (primary) hypertension: Secondary | ICD-10-CM | POA: Diagnosis not present

## 2018-10-20 DIAGNOSIS — R42 Dizziness and giddiness: Secondary | ICD-10-CM | POA: Diagnosis not present

## 2018-10-20 DIAGNOSIS — Z7982 Long term (current) use of aspirin: Secondary | ICD-10-CM | POA: Insufficient documentation

## 2018-10-20 DIAGNOSIS — Z79899 Other long term (current) drug therapy: Secondary | ICD-10-CM | POA: Diagnosis not present

## 2018-10-20 NOTE — ED Notes (Signed)
No respiratory or acute distress noted alert and oriented x 3 family with pt call light in reach.

## 2018-10-20 NOTE — ED Notes (Signed)
Via wheelchair to [place] in hospital for scan.

## 2018-10-20 NOTE — ED Notes (Signed)
Pt and wife came out stating that they are waiting for CT but are tired of waiting. This RN called CT and was told be about 20 minutes before could get to them. Pt and wife are discussing it over in the room and will let staff know what they decide.

## 2018-10-20 NOTE — ED Provider Notes (Signed)
[facility] DEPT Provider Note   CSN: [id number] Arrival date & time: 10/20/18  1414     History   Chief Complaint Chief Complaint  Patient presents with  . Suture / Staple Removal    HPI [name] is a 78 y.o. male.     Patient is a 78 year old male presenting today for suture removal.  Patient states that he was cut because he was sleeping and rolled out of bed hitting his head on the table.  He had 19 sutures placed.  Patient states the area has been healing well and he is improving but wife states he is having intermittent dizziness since the fall.  He notices it most if he bends over to pet his dog and he feels a bit off balance.  However it is not persistent.  He currently denies any dizziness.  No unilateral weakness, numbness, facial droop or speech changes.  Patient does not take anticoagulation.  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  Otherwise he continues to take his normal medications.  He denies any chest pain, syncope or shortness of breath.  The history is provided by the patient and the spouse.  Suture / Staple Removal    Past Medical History:  Diagnosis Date  . Diabetes mellitus without complication ([place])   . Hearing loss   . Hepatitis   . Small vessel disease [facility])     Patient Active Problem List   Diagnosis Date Noted  . Hypertension 11/11/2017  . Bradycardia 11/11/2017  . Syncope 11/10/2017  . Onychomycosis 04/19/2017  . Memory loss 01/22/2017  . 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 Hypertension, benign 03/28/2015  . Screening for colon cancer 12/03/2013  . Small vessel disease ([place]) 08/17/2013  . 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 Tobacco use disorder 07/17/2012  . Vitamin D deficiency 01/14/2012  . Sudden visual  loss 08/17/2010  . Elevated prostate specific antigen (PSA) 01/07/2010  . Chest pain 02/10/2009  . Skin sensation disturbance 02/10/2009  . Hepatitis 01/23/2009    Past Surgical History:  Procedure Laterality Date  . APPENDECTOMY          Home Medications    Prior to Admission medications   Medication Sig Start Date End Date Taking? Authorizing Provider  aspirin EC 81 MG tablet Take 81 mg by mouth daily.     [provider]  B Complex Vitamins (VITAMIN-B COMPLEX) TABS Take 1 tablet by mouth daily.     [provider]  baclofen (LIORESAL) 10 MG tablet Take 10 mg by mouth daily.  03/17/17   [provider]  Cholecalciferol (VITAMIN D3) 2000 units capsule Take 2,000 Units by mouth daily.     [provider]  Cyanocobalamin (VITAMIN B-12) 2500 MCG SUBL Take 2,500 mcg by mouth daily.     [provider]  escitalopram (LEXAPRO) 20 MG tablet Take 20 mg by mouth daily.  03/14/15   [provider]  folic acid (FOLVITE) 182 MCG tablet Take 400 mcg by mouth daily. Reported on 05/06/2015    [provider]  ketoconazole (NIZORAL) 2 % cream Apply 1 application topically daily. Between the toes and to the affected area 12/29/17   Marzetta Board, DPM  latanoprost (XALATAN) 0.005 % ophthalmic solution Place 1 drop into both eyes at  bedtime.  07/27/17   [provider]  lisinopril (PRINIVIL,ZESTRIL) 20 MG tablet  12/05/17   [provider]  lisinopril (PRINIVIL,ZESTRIL) 40 MG tablet Take 1 tablet (40 mg total) by mouth daily. 11/12/17   [name], MD  rivastigmine (EXELON) 4.6 mg/24hr Place 4.6 mg onto the skin daily.  10/14/14   [provider]  Vitamin E 400 units TABS Take 400 Units by mouth daily.  02/16/13   [provider]    Family History Family History  Problem Relation Age of Onset  . Diabetes Mother   . Heart disease Father   . Memory loss Paternal Grandfather     Social History  Social History   Tobacco Use  . Smoking status: Current Every Day Smoker    Types: Pipe  . Smokeless tobacco: Never Used  Substance Use Topics  . Alcohol use: No    Alcohol/week: 0.0 standard drinks  . Drug use: No     Allergies   Sulfa antibiotics   Review of Systems Review of Systems  All other systems reviewed and are negative.    Physical Exam Updated Vital Signs BP (!) 153/75 (BP Location: Right Arm)   Pulse (!) 57   Temp 98.2 F (36.8 C) (Oral)   Resp 14   Ht 5\' 10"  (1.778 m)   Wt 98.9 kg   SpO2 98%   BMI 31.28 kg/m   Physical Exam Vitals signs and nursing note reviewed.  Constitutional:      General: He is not in acute distress.    Appearance: He is well-developed and normal weight.  HENT:     Head: Normocephalic and atraumatic.     Comments: Healing ecchymosis around the left eye.  Wound in the eyebrow with sutures intact and seems to be healing well Eyes:     Conjunctiva/sclera: Conjunctivae normal.     Pupils: Pupils are equal, round, and reactive to light.  Neck:     Musculoskeletal: Normal range of motion and neck supple.  Cardiovascular:     Rate and Rhythm: Normal rate and regular rhythm.     Pulses: Normal pulses.  Pulmonary:     Effort: Pulmonary effort is normal. No respiratory distress.  Musculoskeletal: Normal range of motion.        General: No tenderness.  Skin:    General: Skin is warm and dry.     Findings: No erythema or rash.  Neurological:     General: No focal deficit present.     Mental Status: He is alert and oriented to person, place, and time. Mental status is at baseline.     Cranial Nerves: No cranial nerve deficit.     Sensory: No sensory deficit.     Motor: No weakness.     Gait: Gait normal.  Psychiatric:        Behavior: Behavior normal.      ED Treatments / Results  Labs (all labs ordered are listed, but only abnormal results are displayed) Labs Reviewed - No data to display  EKG None  Radiology No  results found.  Procedures .Suture Removal  Date/Time: 10/20/2018 2:39 PM Performed by: Blanchie Dessert, MD Authorized by: Blanchie Dessert, MD   Consent:    Consent obtained:  Verbal   Consent given by:  Patient   Risks discussed:  Bleeding, pain and wound separation Location:    Location:  Head/neck   Head/neck location:  Eyebrow   Eyebrow location:  L eyebrow Procedure details:  Wound appearance:  No signs of infection, good wound healing and clean   Number of sutures removed:  19 Post-procedure details:    Post-removal:  No dressing applied   Patient tolerance of procedure:  Tolerated well, no immediate complications   (including critical care time)  Medications Ordered in ED Medications - No data to display   Initial Impression / Assessment and Plan / ED Course  I have reviewed the triage vital signs and the nursing notes.  Pertinent labs & imaging results that were available during my care of the patient were reviewed by me and considered in my medical decision making (see chart for details).       Patient presenting for suture removal but also wife states he has been having intermittent dizzy spells usually if he bends over.  Patient is well-appearing here and currently asymptomatic.  Vital signs are reassuring.  Patient has no focal neurologic findings on exam.  Sutures removed and wound is healing.  Given patient's dizziness and he did not have any imaging of his brain will do a CT to ensure no intracranial bleed but suspect patient's symptoms are more likely related to concussion.  Final Clinical Impressions(s) / ED Diagnoses   Final diagnoses:  None    ED Discharge Orders    None       Blanchie Dessert, MD 10/24/18 [phone]

## 2018-10-20 NOTE — ED Triage Notes (Signed)
Had sutures place here 5 days ago over left eye here today for removal no redness or drainage noted. Alert and oriented x 3.

## 2018-10-24 DIAGNOSIS — R42 Dizziness and giddiness: Secondary | ICD-10-CM | POA: Insufficient documentation

## 2018-11-21 DIAGNOSIS — I679 Cerebrovascular disease, unspecified: Secondary | ICD-10-CM | POA: Insufficient documentation

## 2018-11-22 ENCOUNTER — Ambulatory Visit: Payer: Medicare Other | Admitting: Podiatry

## 2018-11-22 DIAGNOSIS — Z7739 Contact with and (suspected) exposure to other war theater: Secondary | ICD-10-CM | POA: Insufficient documentation

## 2018-11-22 DIAGNOSIS — F419 Anxiety disorder, unspecified: Secondary | ICD-10-CM | POA: Insufficient documentation

## 2018-11-22 DIAGNOSIS — Z77098 Contact with and (suspected) exposure to other hazardous, chiefly nonmedicinal, chemicals: Secondary | ICD-10-CM | POA: Insufficient documentation

## 2018-11-22 DIAGNOSIS — F0390 Unspecified dementia without behavioral disturbance: Secondary | ICD-10-CM | POA: Insufficient documentation

## 2018-11-22 DIAGNOSIS — F32A Depression, unspecified: Secondary | ICD-10-CM | POA: Insufficient documentation

## 2018-11-24 ENCOUNTER — Other Ambulatory Visit: Payer: Self-pay

## 2018-11-24 ENCOUNTER — Ambulatory Visit (INDEPENDENT_AMBULATORY_CARE_PROVIDER_SITE_OTHER): Payer: Medicare Other | Admitting: Podiatry

## 2018-11-24 ENCOUNTER — Encounter: Payer: Self-pay | Admitting: Podiatry

## 2018-11-24 DIAGNOSIS — M79674 Pain in right toe(s): Secondary | ICD-10-CM | POA: Diagnosis not present

## 2018-11-24 DIAGNOSIS — B351 Tinea unguium: Secondary | ICD-10-CM

## 2018-11-24 DIAGNOSIS — M79675 Pain in left toe(s): Secondary | ICD-10-CM | POA: Diagnosis not present

## 2018-11-24 NOTE — Patient Instructions (Signed)
Diabetes Mellitus and Foot Care Foot care is an important part of your health, especially when you have diabetes. Diabetes may cause you to have problems because of poor blood flow (circulation) to your feet and legs, which can cause your skin to:  Become thinner and drier.  Break more easily.  Heal more slowly.  Peel and crack. You may also have nerve damage (neuropathy) in your legs and feet, causing decreased feeling in them. This means that you may not notice minor injuries to your feet that could lead to more serious problems. Noticing and addressing any potential problems early is the best way to prevent future foot problems. How to care for your feet Foot hygiene  Wash your feet daily with warm water and mild soap. Do not use hot water. Then, pat your feet and the areas between your toes until they are completely dry. Do not soak your feet as this can dry your skin.  Trim your toenails straight across. Do not dig under them or around the cuticle. File the edges of your nails with an emery board or nail file.  Apply a moisturizing lotion or petroleum jelly to the skin on your feet and to dry, brittle toenails. Use lotion that does not contain alcohol and is unscented. Do not apply lotion between your toes. Shoes and socks  Wear clean socks or stockings every day. Make sure they are not too tight. Do not wear knee-high stockings since they may decrease blood flow to your legs.  Wear shoes that fit properly and have enough cushioning. Always look in your shoes before you put them on to be sure there are no objects inside.  To break in new shoes, wear them for just a few hours a day. This prevents injuries on your feet. Wounds, scrapes, corns, and calluses  Check your feet daily for blisters, cuts, bruises, sores, and redness. If you cannot see the bottom of your feet, use a mirror or ask someone for help.  Do not cut corns or calluses or try to remove them with medicine.  If you  find a minor scrape, cut, or break in the skin on your feet, keep it and the skin around it clean and dry. You may clean these areas with mild soap and water. Do not clean the area with peroxide, alcohol, or iodine.  If you have a wound, scrape, corn, or callus on your foot, look at it several times a day to make sure it is healing and not infected. Check for: ? Redness, swelling, or pain. ? Fluid or blood. ? Warmth. ? Pus or a bad smell. General instructions  Do not cross your legs. This may decrease blood flow to your feet.  Do not use heating pads or hot water bottles on your feet. They may burn your skin. If you have lost feeling in your feet or legs, you may not know this is happening until it is too late.  Protect your feet from hot and cold by wearing shoes, such as at the beach or on hot pavement.  Schedule a complete foot exam at least once a year (annually) or more often if you have foot problems. If you have foot problems, report any cuts, sores, or bruises to your health care provider immediately. Contact a health care provider if:  You have a medical condition that increases your risk of infection and you have any cuts, sores, or bruises on your feet.  You have an injury that is not   healing.  You have redness on your legs or feet.  You feel burning or tingling in your legs or feet.  You have pain or cramps in your legs and feet.  Your legs or feet are numb.  Your feet always feel cold.  You have pain around a toenail. Get help right away if:  You have a wound, scrape, corn, or callus on your foot and: ? You have pain, swelling, or redness that gets worse. ? You have fluid or blood coming from the wound, scrape, corn, or callus. ? Your wound, scrape, corn, or callus feels warm to the touch. ? You have pus or a bad smell coming from the wound, scrape, corn, or callus. ? You have a fever. ? You have a red line going up your leg. Summary  Check your feet every day  for cuts, sores, red spots, swelling, and blisters.  Moisturize feet and legs daily.  Wear shoes that fit properly and have enough cushioning.  If you have foot problems, report any cuts, sores, or bruises to your health care provider immediately.  Schedule a complete foot exam at least once a year (annually) or more often if you have foot problems. This information is not intended to replace advice given to you by your health care provider. Make sure you discuss any questions you have with your health care provider. Document Released: 02/20/2000 Document Revised: 04/06/2017 Document Reviewed: 03/26/2016 Elsevier Patient Education  2020 [employer] Inc.  

## 2018-11-30 NOTE — Progress Notes (Signed)
Subjective: Matthew Knight is seen today for follow up painful, elongated, thickened toenails 1-5 b/l feet that she cannot cut. Pain interferes with daily activities. Aggravating factor includes wearing enclosed shoe gear and relieved with periodic debridement.  Current Outpatient Medications on File Prior to Visit  Medication Sig  . aspirin EC 81 MG tablet Take 81 mg by mouth daily.   . B Complex Vitamins (VITAMIN-B COMPLEX) TABS Take 1 tablet by mouth daily.   . baclofen (LIORESAL) 10 MG tablet Take 10 mg by mouth daily.   . Cholecalciferol (VITAMIN D3) 2000 units capsule Take 2,000 Units by mouth daily.   . Cyanocobalamin (VITAMIN B-12) 2500 MCG SUBL Take 2,500 mcg by mouth daily.   Marland Kitchen escitalopram (LEXAPRO) 20 MG tablet TAKE 1 TABLET(20 MG) BY MOUTH DAILY  . folic acid (FOLVITE) A999333 MCG tablet Take 400 mcg by mouth daily. Reported on 05/06/2015  . ketoconazole (NIZORAL) 2 % cream Apply 1 application topically daily. Between the toes and to the affected area  . latanoprost (XALATAN) 0.005 % ophthalmic solution Place 1 drop into both eyes at bedtime.   Marland Kitchen lisinopril (PRINIVIL,ZESTRIL) 20 MG tablet   . lisinopril (PRINIVIL,ZESTRIL) 40 MG tablet Take 1 tablet (40 mg total) by mouth daily.  . memantine (NAMENDA) 10 MG tablet   . RHOPRESSA 0.02 % SOLN   . rivastigmine (EXELON) 4.6 mg/24hr Place 4.6 mg onto the skin daily.   . Vitamin E 400 units TABS Take 400 Units by mouth daily.    No current facility-administered medications on file prior to visit.      Allergies  Allergen Reactions  . Sulfa Antibiotics     Other reaction(s): Other (See Comments) States made him crazy   Objective:  Vascular Examination: Capillary refill time <3 seconds x 10 digits.  Dorsalis pedis present b/l.  Posterior tibial pulses present b/l.  Digital hair absent x 10 digits.  Skin temperature gradient WNL b/l.   Dermatological Examination: Skin with normal turgor, texture and tone  b/l.  Anonychia b/l great toes.   Nailbed completely epithelialized and intact.  Toenails 2-5 b/l discolored, thick, dystrophic with subungual debris and pain with palpation to nailbeds due to thickness of nails.  Musculoskeletal: Muscle strength 5/5 b/l to all LE muscle groups.  No gross bony deformities b/l.  No pain, crepitus or joint limitation noted with ROM.   Neurological Examination: Protective sensation intact 5/5 with 10 gram monofilament bilaterally.  Epicritic sensation present bilaterally.  Vibratory sensation intact bilaterally.   Assessment: Painful onychomycosis toenails 1-5 b/l   Plan: 1. Toenails 1-5 b/l were debrided in length and girth without iatrogenic bleeding. 2. Patient to continue soft, supportive shoe gear 3. Patient to report any pedal injuries to medical professional immediately. 4. Follow up 3 months.  5. Patient/POA to call should there be a concern in the interim.

## 2019-02-20 ENCOUNTER — Encounter: Payer: Self-pay | Admitting: Podiatry

## 2019-02-20 ENCOUNTER — Ambulatory Visit (INDEPENDENT_AMBULATORY_CARE_PROVIDER_SITE_OTHER): Payer: Medicare Other | Admitting: Podiatry

## 2019-02-20 ENCOUNTER — Other Ambulatory Visit: Payer: Self-pay

## 2019-02-20 DIAGNOSIS — M79675 Pain in left toe(s): Secondary | ICD-10-CM

## 2019-02-20 DIAGNOSIS — M79674 Pain in right toe(s): Secondary | ICD-10-CM | POA: Diagnosis not present

## 2019-02-20 DIAGNOSIS — B351 Tinea unguium: Secondary | ICD-10-CM | POA: Diagnosis not present

## 2019-02-20 DIAGNOSIS — E119 Type 2 diabetes mellitus without complications: Secondary | ICD-10-CM | POA: Diagnosis not present

## 2019-03-04 NOTE — Progress Notes (Signed)
Subjective: Matthew Knight is a 78 y.o. y.o. male with h/o diabetes who presents today for preventative diabetic foot care. He is accompanied by his wife on today. Patient has painful, elongated mycotic toenails when ambulating, which pose a risk and interfere with daily activities. Pain is aggravated when wearing enclosed shoe gear and relieved with periodic professional debridement.  [name], MD is patient's PCP.   Medications reviewed in chart.  Allergies  Allergen Reactions  . Darunavir Other (See Comments)  . Sulfa Antibiotics     Other reaction(s): Other (See Comments) States made him crazy   Objective: There were no vitals filed for this visit.  Vascular Examination: Capillary refill time to digits <3 seconds b/l.  Dorsalis pedis and posterior tibial pulses palpable b/l.  Digital hair absent b/l.  Skin temperature gradient WNL b/l.  Dermatological Examination: Skin with normal turgor, texture and tone b/l.  Toenails 2-5 b/l discolored, thick, dystrophic with subungual debris and pain with palpation to nailbeds due to thickness of nails.  Anonychia b/l great toes with evidence of permanent total nail avulsion. Nailbeds completely epithelialized and intact.  Musculoskeletal: Muscle strength 5/5 to all LE muscle groups b/l.  Neurological: Sensation intact 5/5 b/l with 10 gram monofilament.  Vibratory sensation intact b/l.   Assessment: 1. Painful onychomycosis toenails 1-5 b/l 2.   NIDDM  Plan: 1. Continue diabetic foot care principles. Literature dispensed on today. 2. Toenails 2-5 b/l were debrided in length and girth without iatrogenic bleeding. 3. Patient to continue soft, supportive shoe gear daily. 4. Patient to report any pedal injuries to medical professional immediately. 5. Follow up 3 months. 6. Patient/POA to call should there be a concern in the interim.

## 2019-04-05 ENCOUNTER — Ambulatory Visit: Payer: Medicare Other

## 2019-04-16 ENCOUNTER — Ambulatory Visit: Payer: Medicare Other

## 2019-05-22 ENCOUNTER — Encounter: Payer: Self-pay | Admitting: Podiatry

## 2019-05-22 ENCOUNTER — Ambulatory Visit (INDEPENDENT_AMBULATORY_CARE_PROVIDER_SITE_OTHER): Payer: Medicare Other | Admitting: Podiatry

## 2019-05-22 ENCOUNTER — Other Ambulatory Visit: Payer: Self-pay

## 2019-05-22 VITALS — Temp 97.0°F

## 2019-05-22 DIAGNOSIS — M79674 Pain in right toe(s): Secondary | ICD-10-CM

## 2019-05-22 DIAGNOSIS — L84 Corns and callosities: Secondary | ICD-10-CM

## 2019-05-22 DIAGNOSIS — B351 Tinea unguium: Secondary | ICD-10-CM

## 2019-05-22 DIAGNOSIS — E119 Type 2 diabetes mellitus without complications: Secondary | ICD-10-CM | POA: Diagnosis not present

## 2019-05-22 DIAGNOSIS — M79675 Pain in left toe(s): Secondary | ICD-10-CM

## 2019-05-22 NOTE — Patient Instructions (Signed)

## 2019-05-27 NOTE — Progress Notes (Signed)
Subjective: Matthew Knight presents today for follow up of preventative diabetic foot care and callus(es) b/l great toes and painful mycotic toenails b/l that are difficult to trim. Pain interferes with ambulation. Aggravating factors include wearing enclosed shoe gear. Pain is relieved with periodic professional debridement..   Allergies  Allergen Reactions  . Darunavir Other (See Comments)  . Sulfa Antibiotics     Other reaction(s): Other (See Comments) States made him crazy     Objective: Vitals:   05/22/19 1439  Temp: (!) 67 F (36.1 C)    Pt 79 y.o. year old Caucasian male obese in NAD. AAO x 3.   Vascular Examination:  Capillary fill time to digits <3 seconds b/l. Palpable DP pulses b/l. Palpable PT pulses b/l. Pedal hair absent b/l Skin temperature gradient within normal limits b/l.  Dermatological Examination: Pedal skin with normal turgor, texture and tone bilaterally. No open wounds bilaterally. No interdigital macerations bilaterally. Toenails 2-5 bilaterally elongated, dystrophic, thickened, and crumbly with subungual debris and tenderness to dorsal palpation. Anonychia noted L hallux and R hallux. Nailbed(s) epithelialized.  Hyperkeratotic lesion(s) b/l hallux.  No erythema, no edema, no drainage, no flocculence.  Musculoskeletal: Normal muscle strength 5/5 to all lower extremity muscle groups bilaterally and no pain crepitus or joint limitation noted with ROM b/l  Neurological: Protective sensation intact 5/5 intact bilaterally with 10g monofilament b/l Vibratory sensation intact b/l  Assessment: 1. Pain due to onychomycosis of toenails of both feet   2. Callus   3. Controlled type 2 diabetes mellitus without complication, without long-term current use of insulin ([place])    Plan: -Continue diabetic foot care principles. Literature dispensed on today.  -Toenails 2-5 debrided in length and girth without iatrogenic bleeding with sterile nail nipper and dremel.   -Callus(es) b/l hallux were debrided without complication or incident. Total number debrided =2. -Patient to continue soft, supportive shoe gear daily. -Patient to report any pedal injuries to medical professional immediately. -Patient/POA to call should there be question/concern in the interim.  Return in about 3 months (around 08/22/2019) for nail trim.

## 2019-08-24 ENCOUNTER — Ambulatory Visit (INDEPENDENT_AMBULATORY_CARE_PROVIDER_SITE_OTHER): Payer: Medicare Other | Admitting: Podiatry

## 2019-08-24 ENCOUNTER — Encounter: Payer: Self-pay | Admitting: Podiatry

## 2019-08-24 ENCOUNTER — Other Ambulatory Visit: Payer: Self-pay

## 2019-08-24 DIAGNOSIS — B351 Tinea unguium: Secondary | ICD-10-CM

## 2019-08-24 DIAGNOSIS — M79675 Pain in left toe(s): Secondary | ICD-10-CM

## 2019-08-24 DIAGNOSIS — L84 Corns and callosities: Secondary | ICD-10-CM | POA: Diagnosis not present

## 2019-08-24 DIAGNOSIS — M79674 Pain in right toe(s): Secondary | ICD-10-CM | POA: Diagnosis not present

## 2019-08-24 DIAGNOSIS — E119 Type 2 diabetes mellitus without complications: Secondary | ICD-10-CM

## 2019-09-01 NOTE — Progress Notes (Signed)
Subjective: Matthew Knight presents today for diabetic foot evaluation and painful callus(es) b/l hallux and painful mycotic toenails b/l that are difficult to trim. Pain interferes with ambulation. Aggravating factors include wearing enclosed shoe gear. Pain is relieved with periodic professional debridement.  [name], MD is patient's PCP.  Wife is present during today's visit. She voices no new concerns on today's visit.  Past Medical History:  Diagnosis Date  . Diabetes mellitus without complication ([place])   . Hearing loss   . Hepatitis   . Small vessel disease [facility])      Patient Active Problem List   Diagnosis Date Noted  . Hypertension 11/11/2017  . Bradycardia 11/11/2017  . Syncope 11/10/2017  . Onychomycosis 04/19/2017  . Memory loss 01/22/2017  . 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 Hypertension, benign 03/28/2015  . Screening for colon cancer 12/03/2013  . Small vessel disease ([place]) 08/17/2013  . 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 Tobacco use disorder 07/17/2012  . Vitamin D deficiency 01/14/2012  . Sudden visual loss 08/17/2010  . Elevated prostate specific antigen (PSA) 01/07/2010  . Chest pain 02/10/2009  . Skin sensation disturbance 02/10/2009  . Hepatitis 01/23/2009    Current Outpatient Medications on File Prior to Visit  Medication Sig Dispense Refill  . amoxicillin (AMOXIL) 500 MG capsule     . aspirin EC 81 MG tablet Take 81 mg by mouth daily.     . B Complex Vitamins (VITAMIN-B COMPLEX) TABS Take 1 tablet by mouth daily.     . baclofen (LIORESAL) 10 MG tablet Take 10 mg by mouth daily.     . Cholecalciferol (VITAMIN D3) 2000 units capsule Take 2,000 Units by mouth daily.     . Cyanocobalamin (VITAMIN B-12) 2500 MCG SUBL Take 2,500 mcg by mouth daily.     . cyclobenzaprine (FLEXERIL) 10 MG tablet Take by mouth.    . escitalopram (LEXAPRO) 20 MG tablet  TAKE 1 TABLET(20 MG) BY MOUTH DAILY    . folic acid (FOLVITE) 409 MCG tablet Take 400 mcg by mouth daily. Reported on 05/06/2015    . ibuprofen (ADVIL) 600 MG tablet Take by mouth.    Marland Kitchen ketoconazole (NIZORAL) 2 % cream Apply 1 application topically daily. Between the toes and to the affected area 30 g 0  . latanoprost (XALATAN) 0.005 % ophthalmic solution Place 1 drop into both eyes at bedtime.     Marland Kitchen lisinopril (PRINIVIL,ZESTRIL) 20 MG tablet     . lisinopril (PRINIVIL,ZESTRIL) 40 MG tablet Take 1 tablet (40 mg total) by mouth daily. 30 tablet 2  . memantine (NAMENDA) 10 MG tablet     . oxyCODONE-acetaminophen (PERCOCET/ROXICET) 5-325 MG tablet Take by mouth.    . RHOPRESSA 0.02 % SOLN     . rivastigmine (EXELON) 4.6 mg/24hr Place 4.6 mg onto the skin daily.     . Vitamin E 400 units TABS Take 400 Units by mouth daily.      No current facility-administered medications on file prior to visit.     Allergies  Allergen Reactions  . Darunavir Other (See Comments)  . Sulfa Antibiotics     Other reaction(s): Other (See Comments) States made him crazy    Objective: Matthew Knight is a pleasant 79 y.o. Caucasian male WD, WN in NAD. AAO x 3.  There were no vitals filed for this visit.  Vascular Examination: Neurovascular status unchanged b/l lower extremities. Capillary fill  time to digits <3 seconds b/l lower extremities. Palpable pedal pulses b/l LE. Pedal hair absent. Lower extremity skin temperature gradient within normal limits. No edema noted b/l lower extremities.  Dermatological Examination: Pedal skin with normal turgor, texture and tone bilaterally. No open wounds bilaterally. No interdigital macerations bilaterally. Toenails 2-5 bilaterally elongated, discolored, dystrophic, thickened, and crumbly with subungual debris and tenderness to dorsal palpation. Anonychia noted L hallux and R hallux. Nailbed(s) epithelialized.   Musculoskeletal: Normal muscle strength 5/5 to all  lower extremity muscle groups bilaterally. No pain crepitus or joint limitation noted with ROM b/l. No gross bony deformities bilaterally.  Neurological Examination: Protective sensation intact 5/5 intact bilaterally with 10g monofilament b/l. Vibratory sensation intact b/l. Proprioception intact bilaterally. Assessment: 1. Pain due to onychomycosis of toenails of both feet   2. Callus   3. Controlled type 2 diabetes mellitus without complication, without long-term current use of insulin ([place])    Plan: -Examined patient. -No new findings. No new orders. -Continue diabetic foot care principles. -Toenails 2-5 bilaterally debrided in length and girth without iatrogenic bleeding with sterile nail nipper and dremel.  -Callus(es) L hallux and R hallux pared utilizing sterile scalpel blade without complication or incident. Total number debrided =2. -Patient to report any pedal injuries to medical professional immediately. -Patient to continue soft, supportive shoe gear daily. -Patient/POA to call should there be question/concern in the interim.  Return in about 3 months (around 11/24/2019) for nail trim.  Marzetta Board, DPM

## 2019-12-03 ENCOUNTER — Ambulatory Visit: Payer: Medicare Other | Admitting: Podiatry

## 2020-04-28 DIAGNOSIS — R0683 Snoring: Secondary | ICD-10-CM | POA: Insufficient documentation

## 2020-04-28 DIAGNOSIS — R9431 Abnormal electrocardiogram [ECG] [EKG]: Secondary | ICD-10-CM | POA: Insufficient documentation

## 2020-05-06 ENCOUNTER — Encounter: Payer: Self-pay | Admitting: Podiatry

## 2020-05-06 ENCOUNTER — Ambulatory Visit (INDEPENDENT_AMBULATORY_CARE_PROVIDER_SITE_OTHER): Payer: Medicare Other | Admitting: Podiatry

## 2020-05-06 ENCOUNTER — Other Ambulatory Visit: Payer: Self-pay

## 2020-05-06 DIAGNOSIS — L84 Corns and callosities: Secondary | ICD-10-CM | POA: Diagnosis not present

## 2020-05-06 DIAGNOSIS — M79675 Pain in left toe(s): Secondary | ICD-10-CM

## 2020-05-06 DIAGNOSIS — M79674 Pain in right toe(s): Secondary | ICD-10-CM

## 2020-05-06 DIAGNOSIS — E119 Type 2 diabetes mellitus without complications: Secondary | ICD-10-CM | POA: Diagnosis not present

## 2020-05-06 DIAGNOSIS — B351 Tinea unguium: Secondary | ICD-10-CM | POA: Diagnosis not present

## 2020-05-11 NOTE — Progress Notes (Signed)
Subjective: Matthew Knight presents today for diabetic foot evaluation and painful callus(es) b/l hallux and painful mycotic toenails b/l that are difficult to trim. Pain interferes with ambulation. Aggravating factors include wearing enclosed shoe gear. Pain is relieved with periodic professional debridement.  [name], MD is patient's PCP.  Wife is present during today's visit. She voices no new concerns on today's visit.  Allergies  Allergen Reactions  . Darunavir Other (See Comments)    Other reaction(s): Eruption of skin  . Sulfa Antibiotics     Other reaction(s): Other (See Comments) States made him crazy  . No Known Allergies Rash    Objective: Matthew Knight is a pleasant 80 y.o. Caucasian male WD, WN in NAD. AAO x 3.  There were no vitals filed for this visit.  Vascular Examination: Neurovascular status unchanged b/l lower extremities. Capillary fill time to digits <3 seconds b/l lower extremities. Palpable pedal pulses b/l LE. Pedal hair absent. Lower extremity skin temperature gradient within normal limits. No edema noted b/l lower extremities.  Dermatological Examination: Pedal skin with normal turgor, texture and tone bilaterally. No open wounds bilaterally. No interdigital macerations bilaterally. Toenails 2-5 bilaterally elongated, discolored, dystrophic, thickened, and crumbly with subungual debris and tenderness to dorsal palpation. Anonychia noted L hallux and R hallux. Nailbed(s) epithelialized.  Hyperkeratotic lesion(s) L hallux and R hallux.  No erythema, no edema, no drainage, no fluctuance.  Musculoskeletal: Normal muscle strength 5/5 to all lower extremity muscle groups bilaterally. No pain crepitus or joint limitation noted with ROM b/l. No gross bony deformities bilaterally.  Neurological Examination: Protective sensation intact 5/5 intact bilaterally with 10g monofilament b/l. Vibratory sensation intact b/l. Proprioception intact  bilaterally. Assessment: 1. Pain due to onychomycosis of toenails of both feet   2. Callus   3. Controlled type 2 diabetes mellitus without complication, without long-term current use of insulin ([place])      Plan: -Examined patient. -No new findings. No new orders. -Continue diabetic foot care principles. -Toenails 2-5 bilaterally debrided in length and girth without iatrogenic bleeding with sterile nail nipper and dremel.  -Callus(es) L hallux and R hallux pared utilizing sterile scalpel blade without complication or incident. Total number debrided =2. -Patient to report any pedal injuries to medical professional immediately. -Patient to continue soft, supportive shoe gear daily. -Patient/POA to call should there be question/concern in the interim.  Return in about 3 months (around 08/06/2020).  Marzetta Board, DPM

## 2020-08-19 ENCOUNTER — Ambulatory Visit: Payer: Medicare Other | Admitting: Podiatry

## 2020-08-20 ENCOUNTER — Ambulatory Visit (INDEPENDENT_AMBULATORY_CARE_PROVIDER_SITE_OTHER): Payer: Medicare Other | Admitting: Podiatry

## 2020-08-20 ENCOUNTER — Other Ambulatory Visit: Payer: Self-pay

## 2020-08-20 ENCOUNTER — Encounter: Payer: Self-pay | Admitting: Podiatry

## 2020-08-20 DIAGNOSIS — M79675 Pain in left toe(s): Secondary | ICD-10-CM | POA: Diagnosis not present

## 2020-08-20 DIAGNOSIS — B351 Tinea unguium: Secondary | ICD-10-CM

## 2020-08-20 DIAGNOSIS — M79674 Pain in right toe(s): Secondary | ICD-10-CM | POA: Diagnosis not present

## 2020-08-26 NOTE — Progress Notes (Signed)
  Subjective:  Patient ID: Matthew Knight, male    DOB: Aug 23, 1940,  MRN: [id number]  80 y.o. male presents with painful thick toenails that are difficult to trim. Pain interferes with ambulation. Aggravating factors include wearing enclosed shoe gear. Pain is relieved with periodic professional debridement.  Patient is accompanied by his wife on today's visit. They voice no new pedal concerns on today's visit.  Review of Systems: Negative except as noted in the HPI.   Allergies  Allergen Reactions   Darunavir Other (See Comments)    Other reaction(s): Eruption of skin   Sulfa Antibiotics     Other reaction(s): Other (See Comments) States made him crazy   No Known Allergies Rash    Objective:  There were no vitals filed for this visit. Constitutional Patient is a pleasant 80 y.o. Caucasian male obese in NAD. AAO x 3.  Vascular Capillary refill time to digits immediate b/l. Capillary fill time to digits <3 seconds b/l lower extremities. Palpable pedal pulses b/l LE. Pedal hair absent. Lower extremity skin temperature gradient within normal limits. No edema noted b/l lower extremities. No cyanosis or clubbing noted.  Neurologic Normal speech. Protective sensation intact 5/5 intact bilaterally with 10g monofilament b/l.  Dermatologic Toenails 1-5 b/l elongated, discolored, dystrophic, thickened, crumbly with subungual debris and tenderness to dorsal palpation.  Orthopedic: Normal muscle strength 5/5 to all lower extremity muscle groups bilaterally. No pain crepitus or joint limitation noted with ROM b/l. No gross bony deformities bilaterally.    Assessment:   1. Pain due to onychomycosis of toenails of both feet    Plan:  Patient was evaluated and treated and all questions answered.  Onychomycosis with pain -Nails palliatively debridement as below. -Educated on self-care  Procedure: Nail Debridement Rationale: Pain Type of Debridement: manual, sharp  debridement. Instrumentation: Nail nipper, rotary burr. Number of Nails: 10  -Examined patient. -Patient to continue soft, supportive shoe gear daily. -Toenails 1-5 b/l were debrided in length and girth with sterile nail nippers and dremel without iatrogenic bleeding.  -Patient to report any pedal injuries to medical professional immediately. -Patient/POA to call should there be question/concern in the interim.  Return in about 3 months (around 11/20/2020).  Marzetta Board, DPM

## 2020-11-28 ENCOUNTER — Encounter: Payer: Self-pay | Admitting: Podiatry

## 2020-11-28 ENCOUNTER — Ambulatory Visit (INDEPENDENT_AMBULATORY_CARE_PROVIDER_SITE_OTHER): Payer: Medicare Other | Admitting: Podiatry

## 2020-11-28 ENCOUNTER — Other Ambulatory Visit: Payer: Self-pay

## 2020-11-28 DIAGNOSIS — M79674 Pain in right toe(s): Secondary | ICD-10-CM | POA: Diagnosis not present

## 2020-11-28 DIAGNOSIS — M79675 Pain in left toe(s): Secondary | ICD-10-CM

## 2020-11-28 DIAGNOSIS — B351 Tinea unguium: Secondary | ICD-10-CM

## 2020-11-28 NOTE — Progress Notes (Signed)
Subjective: Matthew Knight is a pleasant 80 y.o. male patient seen today for painful thick toenails that are difficult to trim. Pain interferes with ambulation. Aggravating factors include wearing enclosed shoe gear. Pain is relieved with periodic professional debridement.   He has been brought to his appointment by his wife today who is waiting in the waiting room. Mr. Xiang voices no new pedal concerns.  PCP is [name], MD. Last visit was: 04/07/2020.  Allergies  Allergen Reactions   Darunavir Other (See Comments)    Other reaction(s): Eruption of skin   Sulfa Antibiotics     Other reaction(s): Other (See Comments) States made him crazy   No Known Allergies Rash    Objective: Physical Exam  General: JAIVIAN BATTAGLINI is a pleasant 80 y.o. Caucasian male,  in NAD. AAO x 3.   Vascular:  Capillary fill time to digits <3 seconds b/l lower extremities. Palpable DP pulse(s) b/l lower extremities Palpable PT pulse(s) b/l lower extremities Pedal hair absent. Lower extremity skin temperature gradient within normal limits. No pain with calf compression b/l. No edema noted b/l lower extremities.   Dermatological:  Pedal skin warm and supple b/l. No interdigital macerations noted b/l. No open wounds b/l. Toenails 1-5 b/l elongated, discolored, dystrophic, thickened, crumbly with subungual debris and tenderness to dorsal palpation.  Musculoskeletal:  Muscle strength 5/5 to all muscle groups b/l. No pain, crepitus, nor joint limitation noted with ROM b/l. No gross bony deformities b/l lower extremities.  Neurological:  Protective sensation intact with 10 gram monofilament b/l. Vibratory sensation intact b/l. Patient unable to follow commands of LE neurological examination due to cognitive deficits. Patient does respond to external noxious stimuli.  Assessment and Plan:  1. Pain due to onychomycosis of toenails of both feet   Consent given for treatment as described  below: -Examined patient. -Patient to continue soft, supportive shoe gear daily. -Toenails 1-5 b/l were debrided in length and girth with sterile nail nippers and dremel without iatrogenic bleeding.  -Patient to report any pedal injuries to medical professional immediately. -Patient/POA to call should there be question/concern in the interim.  Return in about 3 months (around 02/27/2021).  Marzetta Board, DPM

## 2021-03-04 ENCOUNTER — Emergency Department (HOSPITAL_COMMUNITY): Payer: Medicare Other

## 2021-03-04 ENCOUNTER — Encounter (HOSPITAL_COMMUNITY): Payer: Self-pay

## 2021-03-04 ENCOUNTER — Other Ambulatory Visit: Payer: Self-pay

## 2021-03-04 ENCOUNTER — Emergency Department (HOSPITAL_COMMUNITY)
Admission: EM | Admit: 2021-03-04 | Discharge: 2021-03-04 | Disposition: A | Discharge status: Home / Self Care | Payer: Medicare Other | Attending: Emergency Medicine | Admitting: Emergency Medicine

## 2021-03-04 DIAGNOSIS — S52502A Unspecified fracture of the lower end of left radius, initial encounter for closed fracture: Secondary | ICD-10-CM | POA: Diagnosis not present

## 2021-03-04 DIAGNOSIS — S40922A Unspecified superficial injury of left upper arm, initial encounter: Secondary | ICD-10-CM | POA: Diagnosis present

## 2021-03-04 DIAGNOSIS — W010XXA Fall on same level from slipping, tripping and stumbling without subsequent striking against object, initial encounter: Secondary | ICD-10-CM | POA: Insufficient documentation

## 2021-03-04 DIAGNOSIS — Z79899 Other long term (current) drug therapy: Secondary | ICD-10-CM | POA: Diagnosis not present

## 2021-03-04 DIAGNOSIS — E119 Type 2 diabetes mellitus without complications: Secondary | ICD-10-CM | POA: Diagnosis not present

## 2021-03-04 DIAGNOSIS — F0390 Unspecified dementia without behavioral disturbance: Secondary | ICD-10-CM | POA: Insufficient documentation

## 2021-03-04 DIAGNOSIS — S52602A Unspecified fracture of lower end of left ulna, initial encounter for closed fracture: Secondary | ICD-10-CM

## 2021-03-04 DIAGNOSIS — I10 Essential (primary) hypertension: Secondary | ICD-10-CM | POA: Insufficient documentation

## 2021-03-04 DIAGNOSIS — F17290 Nicotine dependence, other tobacco product, uncomplicated: Secondary | ICD-10-CM | POA: Insufficient documentation

## 2021-03-04 DIAGNOSIS — M25532 Pain in left wrist: Secondary | ICD-10-CM | POA: Diagnosis not present

## 2021-03-04 DIAGNOSIS — Z7982 Long term (current) use of aspirin: Secondary | ICD-10-CM | POA: Diagnosis not present

## 2021-03-04 DIAGNOSIS — Y9289 Other specified places as the place of occurrence of the external cause: Secondary | ICD-10-CM | POA: Diagnosis not present

## 2021-03-04 LAB — BASIC METABOLIC PANEL
Anion gap: 5 (ref 5–15)
BUN: 15 mg/dL (ref 8–23)
CO2: 27 mmol/L (ref 22–32)
Calcium: 8.8 mg/dL — ABNORMAL LOW (ref 8.9–10.3)
Chloride: 110 mmol/L (ref 98–111)
Creatinine, Ser: 1.11 mg/dL (ref 0.61–1.24)
GFR, Estimated: >60 mL/min (ref >60)
Glucose, Bld: 117 mg/dL — ABNORMAL HIGH (ref 70–99)
Potassium: 4 mmol/L (ref 3.5–5.1)
Sodium: 142 mmol/L (ref 135–145)

## 2021-03-04 LAB — CBC
HCT: 44.3 % (ref 39.0–52.0)
Hemoglobin: 14.1 g/dL (ref 13.0–17.0)
MCH: 28 pg (ref 26.0–34.0)
MCHC: 31.8 g/dL (ref 30.0–36.0)
MCV: 88.1 fL (ref 80.0–100.0)
Platelets: 364 10*3/uL (ref 150–400)
RBC: 5.03 MIL/uL (ref 4.22–5.81)
RDW: 12.4 % (ref 11.5–15.5)
WBC: 7.9 10*3/uL (ref 4.0–10.5)
nRBC: 0 % (ref 0.0–0.2)

## 2021-03-04 LAB — CBG MONITORING, ED: Glucose-Capillary: 97 mg/dL (ref 70–99)

## 2021-03-04 MED ORDER — FENTANYL CITRATE PF 50 MCG/ML IJ SOSY
25.0000 ug | PREFILLED_SYRINGE | Freq: Once | INTRAMUSCULAR | Status: AC
  Administered 2021-03-04: 16:00:00 25 ug via INTRAVENOUS
  Filled 2021-03-04: qty 1

## 2021-03-04 MED ORDER — ONDANSETRON HCL 4 MG/2ML IJ SOLN
4.0000 mg | Freq: Once | INTRAMUSCULAR | Status: AC
  Administered 2021-03-04: 16:00:00 4 mg via INTRAVENOUS
  Filled 2021-03-04: qty 2

## 2021-03-04 NOTE — ED Provider Notes (Signed)
[facility] DEPT Provider Note   CSN: [id number] Arrival date & time: 03/04/21  1316     History Chief Complaint  Patient presents with   Loss of Consciousness    Matthew Knight is a 80 y.o. male.  Patient is a 80 year old male with a history of dementia, diabetes, hypertension who is presenting today with his family after a fall.  His grandson gives a details and reports that he got up after taking a nap and he was unsteady and he felt a bit lightheaded and then fell catching himself with his left arm.  Grandson denied him losing consciousness he did not hit his head.  They were unable to get him up off the floor so called 911.  Patient denies any pain in his legs, back, chest or abdomen.  He is having pain and has deformity of his left wrist.  He is right-handed.  Patient has been eating and drinking normally recently he has had no recent illnesses or medication changes.  The history is provided by the patient, a relative and a caregiver.  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   Dizziness 10/24/2018   Hypertension 11/11/2017   Bradycardia 11/11/2017   Syncope 11/10/2017   Onychomycosis 04/19/2017   Memory loss 01/22/2017   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   Iliac artery aneurysm, 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   Hypertension, benign 03/28/2015   Screening for colon cancer 12/03/2013   Small vessel disease ([place]) 08/17/2013   Sensorineural hearing loss, 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   Tobacco use disorder 07/17/2012   Vitamin D  deficiency 01/14/2012   Sudden visual loss 08/17/2010   Elevated prostate specific antigen (PSA) 01/07/2010   Chest pain 02/10/2009   Skin sensation disturbance 02/10/2009   Hepatitis 01/23/2009    Past Surgical History:  Procedure Laterality Date   APPENDECTOMY         Family History  Problem Relation Age of Onset   Diabetes Mother    Heart disease Father    Memory loss Paternal Grandfather     Social History   Tobacco Use   Smoking status: Every Day    Types: Pipe   Smokeless tobacco: Never  Vaping Use   Vaping Use: Never used  Substance Use Topics   Alcohol use: No    Alcohol/week: 0.0 standard drinks   Drug use: No    Home Medications Prior to Admission medications   Medication Sig Start Date End Date Taking? Authorizing Provider  amoxicillin (AMOXIL) 500 MG tablet  10/16/19   [provider]  aspirin EC 81 MG tablet Take 81 mg by mouth daily.     [provider]  B Complex Vitamins (VITAMIN-B COMPLEX) TABS Take 1 tablet by mouth daily.     [provider]  baclofen (LIORESAL) 10 MG tablet Take 10 mg by mouth daily.  03/17/17   [provider]  Cholecalciferol (VITAMIN D3) 2000 units capsule Take 2,000 Units by mouth daily.     [provider]  Cyanocobalamin (VITAMIN  B-12) 2500 MCG SUBL Take 2,500 mcg by mouth daily.     [provider]  cyclobenzaprine (FLEXERIL) 10 MG tablet Take by mouth. 02/09/13   [provider]  erythromycin ophthalmic ointment  07/23/20   [provider]  escitalopram (LEXAPRO) 20 MG tablet Take 1 tablet by mouth daily. 07/11/17   [provider]  folic acid (FOLVITE) 834 MCG tablet Take 400 mcg by mouth daily. Reported on 05/06/2015    [provider]  ibuprofen (ADVIL) 600 MG tablet Take by mouth. 02/09/13   [provider]  isosorbide mononitrate (IMDUR) 30 MG 24 hr tablet  04/28/20   [provider]  isosorbide mononitrate (IMDUR) 30  MG 24 hr tablet Take 30 mg by mouth daily. 04/28/20   [provider]  ketoconazole (NIZORAL) 2 % cream Apply 1 application topically daily. Between the toes and to the affected area 12/29/17   [place], Stephani Police, DPM  latanoprost (XALATAN) 0.005 % ophthalmic solution Place 1 drop into both eyes at bedtime.  07/27/17   [provider]  lisinopril (ZESTRIL) 40 MG tablet Take 1 tablet by mouth daily. 02/01/20   [provider]  lisinopril (ZESTRIL) 40 MG tablet Take by mouth. 10/14/20   [provider]  memantine (NAMENDA) 10 MG tablet  11/22/18   [provider]  omeprazole (PRILOSEC) 20 MG capsule  04/07/20   [provider]  omeprazole (PRILOSEC) 20 MG capsule Take 20 mg by mouth daily. 04/07/20   [provider]  oxyCODONE-acetaminophen (PERCOCET/ROXICET) 5-325 MG tablet Take by mouth. 02/09/13   [provider]  pravastatin (PRAVACHOL) 20 MG tablet  04/28/20   [provider]  pravastatin (PRAVACHOL) 20 MG tablet Take 20 mg by mouth daily. 04/28/20   [provider]  RHOPRESSA 0.02 % SOLN  11/15/18   [provider]  rivastigmine (EXELON) 4.6 mg/24hr Place onto the skin. 12/12/19   [provider]  Vitamin E 400 units TABS Take 400 Units by mouth daily.  02/16/13   [provider]    Allergies    Darunavir, Sulfa antibiotics, and No known allergies  Review of Systems   Review of Systems  Cardiovascular:  Positive for syncope.  All other systems reviewed and are negative.  Physical Exam Updated Vital Signs BP (!) 173/72    Pulse (!) 56    Temp 99.8 F (37.7 C) (Oral)    Resp 17    Ht 6' (1.829 m)    Wt 90.7 kg    SpO2 100%    BMI 27.12 kg/m   Physical Exam Vitals and nursing note reviewed.  Constitutional:      General: He is not in acute distress.    Appearance: He is well-developed.  HENT:     Head: Normocephalic and atraumatic.     Nose: Nose normal.  Eyes:      Conjunctiva/sclera: Conjunctivae normal.     Pupils: Pupils are equal, round, and reactive to light.  Cardiovascular:     Rate and Rhythm: Normal rate and regular rhythm.     Heart sounds: No murmur heard. Pulmonary:     Effort: Pulmonary effort is normal. No respiratory distress.     Breath sounds: Normal breath sounds. No wheezing or rales.  Abdominal:     General: There is no distension.     Palpations: Abdomen is soft.     Tenderness: There is no abdominal tenderness. There is no guarding or rebound.  Musculoskeletal:  General: Tenderness, deformity and signs of injury present. Normal range of motion.     Cervical back: Normal range of motion and neck supple. No tenderness.  Skin:    General: Skin is warm and dry.     Findings: No erythema or rash.  Neurological:     Mental Status: He is alert and oriented to person, place, and time.  Psychiatric:        Behavior: Behavior normal.    ED Results / Procedures / Treatments   Labs (all labs ordered are listed, but only abnormal results are displayed) Labs Reviewed  BASIC METABOLIC PANEL - Abnormal; Notable for the following components:      Result Value   Glucose, Bld 117 (*)    Calcium 8.8 (*)    All other components within normal limits  CBC  URINALYSIS, ROUTINE W REFLEX MICROSCOPIC  CBG MONITORING, ED    EKG EKG Interpretation  Date/Time:  Wednesday March 04 2021 13:30:57 EST Ventricular Rate:  51 PR Interval:  175 QRS Duration: 113 QT Interval:  459 QTC Calculation: 423 R Axis:   78 Text Interpretation: Sinus rhythm Borderline intraventricular conduction delay No significant change since last tracing Confirmed by Blanchie Dessert [phone]) on 03/04/2021 3:18:32 PM  Radiology DG Wrist Complete Left  Result Date: 03/04/2021 CLINICAL DATA:  Left wrist pain after a fall earlier today. EXAM: LEFT WRIST - COMPLETE 3+ VIEW COMPARISON:  None. FINDINGS: Transverse mildly comminuted fractures of the distal left  radial metaphysis with fracture lines extending to the radiocarpal and radioulnar joints. There is impaction of the fracture fragments with dorsal angulation of the distal fragments. Nondisplaced fracture across the base of the ulnar styloid process. Associated soft tissue swelling. Degenerative changes in the carpal joints. IMPRESSION: Acute fractures of the distal left radius and ulna as described. Electronically Signed   By: Lucienne Capers M.D.   On: 03/04/2021 16:14   DG Wrist Complete Right  Result Date: 03/04/2021 CLINICAL DATA:  Wrist pain after a fall earlier today. EXAM: RIGHT WRIST - COMPLETE 3+ VIEW COMPARISON:  None. FINDINGS: Degenerative changes in the right wrist involving the STT and first carpometacarpal joints. Benign-appearing cyst in the scaphoid bone. No evidence of acute fracture or dislocation. No destructive or expansile bone lesions. Soft tissues are unremarkable. IMPRESSION: Degenerative changes in the right wrist. No acute bony abnormalities. Electronically Signed   By: Lucienne Capers M.D.   On: 03/04/2021 16:13    Procedures Procedures   Medications Ordered in ED Medications  fentaNYL (SUBLIMAZE) injection 25 mcg (has no administration in time range)  ondansetron (ZOFRAN) injection 4 mg (has no administration in time range)    ED Course  I have reviewed the triage vital signs and the nursing notes.  Pertinent labs & imaging results that were available during my care of the patient were reviewed by me and considered in my medical decision making (see chart for details).    MDM Rules/Calculators/A&P                         Patient presenting today after getting lightheaded and losing his balance.  His grandson is present and denies him losing consciousness.  He fell to the floor on an outstretched left arm and has evidence of deformity today.  Patient is otherwise awake and alert.  He is in no acute distress at this time.  He has not had any recent illness but  his grandson says this happens  occasionally after he has been lying down when he gets up he will lose his balance and fall.  He has normal hemoglobin today and BMP is within normal limits.  EKG without acute findings.  Patient will need imaging of his right wrist and was given pain control.  5:16 PM Wrist imaging was reviewed and interpreted by myself with distal radial and ulnar fractures with angulation and joint involvement. Ring removed and pt's splint in place and NV intact.  To f/u with hand surgery.  MDM   Amount and/or Complexity of Data Reviewed Tests in the radiology section of CPT: ordered and reviewed Independent visualization of images, tracings, or specimens: yes        Final Clinical Impression(s) / ED Diagnoses Final diagnoses:  Closed fracture of distal end of left radius, unspecified fracture morphology, initial encounter  Closed fracture of distal end of left ulna, unspecified fracture morphology, initial encounter    Rx / DC Orders ED Discharge Orders     None        Blanchie Dessert, MD 03/04/21 1719

## 2021-03-04 NOTE — ED Notes (Signed)
Pt in bed, pt has splint and sling in place, family at bedside, pt states that he is ready to go home, pt and pt's family verbalized understanding d/c instructions and follow up, pt from dpt via wc.

## 2021-03-04 NOTE — ED Notes (Signed)
Pt in bed, pt states that his pain is a 2/10, states that he doesn't need anything more for pain at this time.

## 2021-03-04 NOTE — ED Triage Notes (Signed)
Pt to er room number 15 via ems, pt states that he is here because "I fell and went boom" states that he doesn't remember why he fell.  EMS reports that pt has a deformity to his L wrist.  Pt awake and oriented times two, ems reports pt has a hx of dementia, pt is hard of hearing.  Re oriented pt, pt states that he doesn't know the day of the week because he is retired.

## 2021-03-04 NOTE — Discharge Instructions (Addendum)
Do not put any pressure on the arm.  Use tylenol for pain.  Elevate when you can to help with swelling.

## 2021-03-04 NOTE — ED Notes (Signed)
Ortho tech at bedside for splint placement. 

## 2021-03-04 NOTE — ED Notes (Signed)
Pt has minimal swelling to L hand, attempted to remove ring, pt states that the ring hasn't been off in years, unable to remove ring. Family at bedside, will continue to monitor swelling.

## 2021-03-04 NOTE — ED Notes (Signed)
Pt in bed, pt has strong L radial pulse, positive sensation to touch, less than three sec cap refill, pain with movement.

## 2021-03-08 ENCOUNTER — Observation Stay (HOSPITAL_COMMUNITY)
Admission: EM | Admit: 2021-03-08 | Discharge: 2021-03-11 | Disposition: A | Discharge status: Skilled Nursing Facility | Payer: Medicare Other | Attending: Family Medicine | Admitting: Family Medicine

## 2021-03-08 ENCOUNTER — Emergency Department (HOSPITAL_COMMUNITY): Payer: Medicare Other

## 2021-03-08 ENCOUNTER — Encounter (HOSPITAL_COMMUNITY): Payer: Self-pay | Admitting: Emergency Medicine

## 2021-03-08 ENCOUNTER — Other Ambulatory Visit: Payer: Self-pay

## 2021-03-08 DIAGNOSIS — R41841 Cognitive communication deficit: Secondary | ICD-10-CM | POA: Diagnosis not present

## 2021-03-08 DIAGNOSIS — Z20822 Contact with and (suspected) exposure to covid-19: Secondary | ICD-10-CM | POA: Diagnosis not present

## 2021-03-08 DIAGNOSIS — G459 Transient cerebral ischemic attack, unspecified: Secondary | ICD-10-CM | POA: Diagnosis not present

## 2021-03-08 DIAGNOSIS — F0390 Unspecified dementia without behavioral disturbance: Secondary | ICD-10-CM | POA: Insufficient documentation

## 2021-03-08 DIAGNOSIS — I10 Essential (primary) hypertension: Secondary | ICD-10-CM | POA: Insufficient documentation

## 2021-03-08 DIAGNOSIS — S52502S Unspecified fracture of the lower end of left radius, sequela: Secondary | ICD-10-CM

## 2021-03-08 DIAGNOSIS — E119 Type 2 diabetes mellitus without complications: Secondary | ICD-10-CM | POA: Diagnosis not present

## 2021-03-08 DIAGNOSIS — R531 Weakness: Secondary | ICD-10-CM | POA: Diagnosis not present

## 2021-03-08 DIAGNOSIS — I951 Orthostatic hypotension: Secondary | ICD-10-CM

## 2021-03-08 DIAGNOSIS — R42 Dizziness and giddiness: Secondary | ICD-10-CM | POA: Diagnosis present

## 2021-03-08 DIAGNOSIS — Z79899 Other long term (current) drug therapy: Secondary | ICD-10-CM | POA: Insufficient documentation

## 2021-03-08 DIAGNOSIS — Z7982 Long term (current) use of aspirin: Secondary | ICD-10-CM | POA: Diagnosis not present

## 2021-03-08 DIAGNOSIS — Z8673 Personal history of transient ischemic attack (TIA), and cerebral infarction without residual deficits: Secondary | ICD-10-CM

## 2021-03-08 LAB — I-STAT CHEM 8, ED
BUN: 20 mg/dL (ref 8–23)
Calcium, Ion: 1.13 mmol/L — ABNORMAL LOW (ref 1.15–1.40)
Chloride: 107 mmol/L (ref 98–111)
Creatinine, Ser: 1 mg/dL (ref 0.61–1.24)
Glucose, Bld: 111 mg/dL — ABNORMAL HIGH (ref 70–99)
HCT: 43 % (ref 39.0–52.0)
Hemoglobin: 14.6 g/dL (ref 13.0–17.0)
Potassium: 3.9 mmol/L (ref 3.5–5.1)
Sodium: 141 mmol/L (ref 135–145)
TCO2: 23 mmol/L (ref 22–32)

## 2021-03-08 LAB — CBC
HCT: 42.6 % (ref 39.0–52.0)
Hemoglobin: 13.9 g/dL (ref 13.0–17.0)
MCH: 28.5 pg (ref 26.0–34.0)
MCHC: 32.6 g/dL (ref 30.0–36.0)
MCV: 87.5 fL (ref 80.0–100.0)
Platelets: 337 K/uL (ref 150–400)
RBC: 4.87 MIL/uL (ref 4.22–5.81)
RDW: 12.4 % (ref 11.5–15.5)
WBC: 10 K/uL (ref 4.0–10.5)
nRBC: 0 % (ref 0.0–0.2)

## 2021-03-08 LAB — COMPREHENSIVE METABOLIC PANEL
ALT: 24 U/L (ref 0–44)
AST: 21 U/L (ref 15–41)
Albumin: 3.5 g/dL (ref 3.5–5.0)
Alkaline Phosphatase: 72 U/L (ref 38–126)
Anion gap: 8 (ref 5–15)
BUN: 16 mg/dL (ref 8–23)
CO2: 22 mmol/L (ref 22–32)
Calcium: 9 mg/dL (ref 8.9–10.3)
Chloride: 108 mmol/L (ref 98–111)
Creatinine, Ser: 1.15 mg/dL (ref 0.61–1.24)
GFR, Estimated: >60 mL/min (ref >60)
Glucose, Bld: 115 mg/dL — ABNORMAL HIGH (ref 70–99)
Potassium: 3.9 mmol/L (ref 3.5–5.1)
Sodium: 138 mmol/L (ref 135–145)
Total Bilirubin: 0.5 mg/dL (ref 0.3–1.2)
Total Protein: 6.7 g/dL (ref 6.5–8.1)

## 2021-03-08 LAB — DIFFERENTIAL
Abs Immature Granulocytes: 0.04 10*3/uL (ref 0.00–0.07)
Basophils Absolute: 0.1 10*3/uL (ref 0.0–0.1)
Basophils Relative: 1 %
Eosinophils Absolute: 0.3 10*3/uL (ref 0.0–0.5)
Eosinophils Relative: 3 %
Immature Granulocytes: 0 %
Lymphocytes Relative: 26 %
Lymphs Abs: 2.5 10*3/uL (ref 0.7–4.0)
Monocytes Absolute: 0.9 10*3/uL (ref 0.1–1.0)
Monocytes Relative: 9 %
Neutro Abs: 6 10*3/uL (ref 1.7–7.7)
Neutrophils Relative %: 61 %

## 2021-03-08 LAB — PROTIME-INR
INR: 0.9 (ref 0.8–1.2)
Prothrombin Time: 12.5 seconds (ref 11.4–15.2)

## 2021-03-08 LAB — APTT: aPTT: 31 s (ref 24–36)

## 2021-03-08 LAB — CBG MONITORING, ED: Glucose-Capillary: 111 mg/dL — ABNORMAL HIGH (ref 70–99)

## 2021-03-08 MED ORDER — SODIUM CHLORIDE 0.9% FLUSH: 3.0000 mL | Freq: Once | INTRAVENOUS | Status: DC

## 2021-03-08 NOTE — Code Documentation (Signed)
Stroke Response Nurse Documentation Code Documentation  Matthew Knight is a 81 y.o. male arriving to [facility] ED via [facility] on 1/1 with past medical hx of HTN, CP, dementia. On aspirin 81 mg daily. Code stroke was activated by EMS.   Patient from home where he was LKW at 2030 and now complaining of left sided weakness.   Stroke team at the bedside on patient arrival. Labs drawn and patient cleared for CT by Dr. Tyrone Nine. Patient to CT with team. NIHSS 1, see documentation for details and code stroke times. Patient with left arm weakness on exam. The following imaging was completed:  CT. Patient is not a candidate for IV Thrombolytic due to improving symptoms. Patient is not a candidate for IR due to No LVO.   Care/Plan: TIA alert.   Bedside handoff with ED RN Vicente Males.    Madelynn Done  Rapid Response RN

## 2021-03-08 NOTE — ED Triage Notes (Signed)
Pt in as code stroke with L side weakness and dizziness. LSN 2030, pt with hx of dementia, but increased confusion per family. Pt had fall 1 wk ago, sustained L wrist fx, swelling and ACE wrap present.

## 2021-03-08 NOTE — ED Provider Notes (Signed)
[facility] Emergency Department Provider Note MRN:  [id number]  Arrival date & time: 03/09/21     Chief Complaint   Code Stroke   History of Present Illness   Matthew Knight is a 81 y.o. year-old male presents to the ED with chief complaint of chief complaint of fall.  He is accompanied by his son, who states that he went to bed normal at around 9pm.  About 1 hour later he woke up to go to the bathroom and fell over.  Son states that when he went to help him up, he couldn't move his left arm or left leg.  Last seen normal 9pm.  Patient says that he felt dizzy and that caused him to fall.  Had a similar event about a week ago and broke his left wrist.    Review of Systems  Pertinent review of systems noted in HPI.    Physical Exam   Vitals:   03/08/21 2215 03/08/21 2245  BP: (!) 159/84 135/66  Pulse:  (!) 55  Resp:  15  Temp:    SpO2:  95%    CONSTITUTIONAL:  elderly-appearing, NAD NEURO:  Alert and oriented x 3, CN 3-12 intact, speech is clear, movements are goal oriented EYES:  eyes equal and reactive ENT/NECK:   CARDIO:  slight bradycardia, regular rhythm PULM:  CTAB no wheezing or rhonchi GI/GU:  non-distended, non-tender MSK/SPINE:  No gross deformities, no edema, left wrist splinted from prior fracture SKIN:  no rash, atraumatic   *Additional and/or pertinent findings included in MDM below  Diagnostic and Interventional Summary    EKG Interpretation  Date/Time:  Sunday March 08 2021 22:53:06 EST Ventricular Rate:  59 PR Interval:  164 QRS Duration: 108 QT Interval:  444 QTC Calculation: 440 R Axis:   95 Text Interpretation: Sinus rhythm Right axis deviation Baseline wander in lead(s) III No significant change was found Confirmed by Gerlene Fee [phone]) on 03/09/2021 12:45:34 AM       Labs Reviewed  COMPREHENSIVE METABOLIC PANEL - Abnormal; Notable for the following components:      Result Value   Glucose, Bld 115 (*)     All other components within normal limits  I-STAT CHEM 8, ED - Abnormal; Notable for the following components:   Glucose, Bld 111 (*)    Calcium, Ion 1.13 (*)    All other components within normal limits  CBG MONITORING, ED - Abnormal; Notable for the following components:   Glucose-Capillary 111 (*)    All other components within normal limits  PROTIME-INR  APTT  CBC  DIFFERENTIAL    DG Wrist Complete Left  Final Result    CT HEAD CODE STROKE WO CONTRAST  Final Result    MR BRAIN WO CONTRAST    (Results Pending)  MR ANGIO HEAD WO CONTRAST    (Results Pending)    Medications  sodium chloride flush (NS) 0.9 % injection 3 mL (3 mLs Intravenous Not Given 03/09/21 0029)     Procedures  /  Critical Care Procedures  ED Course and Medical Decision Making  I have reviewed the triage vital signs, the nursing notes, and pertinent available records from the EMR.  Complexity of Problems Addressed Acute Illness/Injury that Poses Threat of Life of Bodily Function  Additional Data Reviewed and Analyzed Further history obtained from: EMS on arrival, External historian:  Son, Care Everywhere, and Recent discharge summary.  Notable for recent fall after similar incident and he sustained a wrist  fracture.      ED Course  Patient came in as a code stroke.  He was seen at the bridge by Dr. Francia Greaves and me.  His airway was intact.  He was transported to the [facility] for CT head code stroke.  12:44 AM I discussed the case with the neurologist, Dr. Cheral Marker, who recommends admission for stroke/TIA workup with MRI and MRA brain and carotid dopplers.  I personally viewed the CT scan, EKG, Cardiac Monitor, and Laboratory Results, which were notable for:  Glucose of 111, doubt hypoglycemia as the cause of his fall or dizziness.  No significant electrolyte derangement.  No evidence of anemia to account for his dizziness.  CT scan showed no obvious acute stroke or bleed, there were some small  calcifications seen.  MRI and MRA will be ordered for further evaluation.  EKG shows sinus rhythm.   Cardiac monitor also viewed by me is consistent with sinus rhythm.  After consulting with Dr. Cheral Marker, patient will be admitted to the hospitalist service for stroke work-up.  1:19 AM Case discussed with Dr. Myna Hidalgo, who is appreciated for admitting the patient.       Final Clinical Impressions(s) / ED Diagnoses     ICD-10-CM   1. Left-sided weakness  R53.1          Montine Circle, PA-C 03/09/21 0120    Valarie Merino, MD 03/10/21 1048

## 2021-03-08 NOTE — Consult Note (Signed)
NEURO HOSPITALIST CONSULT NOTE   Requestig physician: Dr. Francia Greaves  Reason for Consult: Acute onset of left sided weakness and dizziness  History obtained from:  EMS and Chart     HPI:                                                                                                                                          Matthew Knight is an 81 y.o. male with a PMHx of DM, hearing loss, hepatitis, small vessel disease and dementia who presents to the ED via EMS as a Code Stroke for new onset of left sided weakness, dizziness and a fall. LKN was 9 PM when he went to bed. About 1 hour later, the patient woke up to go to the bathroom and fell over. When his son tried to help him up, the patient could not move his left arm or leg. The patient recalls that he felt dizzy, which caused his fall. He had a similar event about a week ago and broke his left wrist at that time.   Home medications include ASA, pravastatin, memantine and rivastigmine.   Past Medical History:  Diagnosis Date   Diabetes mellitus without complication (HCC)    Hearing loss    Hepatitis    Small vessel disease ([place])     Past Surgical History:  Procedure Laterality Date   APPENDECTOMY      Family History  Problem Relation Age of Onset   Diabetes Mother    Heart disease Father    Memory loss Paternal Grandfather              Social History:  reports that he has been smoking pipe. He has never used smokeless tobacco. He reports that he does not drink alcohol and does not use drugs.  Allergies  Allergen Reactions   Darunavir Other (See Comments)    Other reaction(s): Eruption of skin   Sulfa Antibiotics     Other reaction(s): Other (See Comments) States made him crazy   No Known Allergies Rash    MEDICATIONS:                                                                                                                      No current facility-administered medications  on file prior  to encounter.   Current Outpatient Medications on File Prior to Encounter  Medication Sig Dispense Refill   amoxicillin (AMOXIL) 500 MG tablet      aspirin EC 81 MG tablet Take 81 mg by mouth daily.      B Complex Vitamins (VITAMIN-B COMPLEX) TABS Take 1 tablet by mouth daily.      baclofen (LIORESAL) 10 MG tablet Take 10 mg by mouth daily.      Cholecalciferol (VITAMIN D3) 2000 units capsule Take 2,000 Units by mouth daily.      Cyanocobalamin (VITAMIN B-12) 2500 MCG SUBL Take 2,500 mcg by mouth daily.      cyclobenzaprine (FLEXERIL) 10 MG tablet Take by mouth.     erythromycin ophthalmic ointment      escitalopram (LEXAPRO) 20 MG tablet Take 1 tablet by mouth daily.     folic acid (FOLVITE) 372 MCG tablet Take 400 mcg by mouth daily. Reported on 05/06/2015     ibuprofen (ADVIL) 600 MG tablet Take by mouth.     isosorbide mononitrate (IMDUR) 30 MG 24 hr tablet      isosorbide mononitrate (IMDUR) 30 MG 24 hr tablet Take 30 mg by mouth daily.     ketoconazole (NIZORAL) 2 % cream Apply 1 application topically daily. Between the toes and to the affected area 30 g 0   latanoprost (XALATAN) 0.005 % ophthalmic solution Place 1 drop into both eyes at bedtime.      lisinopril (ZESTRIL) 40 MG tablet Take 1 tablet by mouth daily.     lisinopril (ZESTRIL) 40 MG tablet Take by mouth.     memantine (NAMENDA) 10 MG tablet      omeprazole (PRILOSEC) 20 MG capsule      omeprazole (PRILOSEC) 20 MG capsule Take 20 mg by mouth daily.     oxyCODONE-acetaminophen (PERCOCET/ROXICET) 5-325 MG tablet Take by mouth.     pravastatin (PRAVACHOL) 20 MG tablet      pravastatin (PRAVACHOL) 20 MG tablet Take 20 mg by mouth daily.     RHOPRESSA 0.02 % SOLN      rivastigmine (EXELON) 4.6 mg/24hr Place onto the skin.     Vitamin E 400 units TABS Take 400 Units by mouth daily.        ROS:                                                                                                                                        Deferred due to acuity of presentation.   There were no vitals taken for this visit.   General Examination:  Physical Exam  HEENT-  [place]/AT   Lungs- Respirations unlabored Extremities- Warm and well perfused. Splint to left wrist/forearm.   Neurological Examination Mental Status: Awake and alert. Speech is fluent with intact naming. Intact comprehension for basic commands. No dysarthria. There is a trend towards patient stating he can't hear well when more complex motor commands are requested. Oriented to self, city and state, but not the day, the year or the month.  Cranial Nerves: II: Temporal visual fields intact with no extinction to DSS. PERRL.   III,IV, VI: No ptosis. EOMI with saccadic pursuits noted. No nystagmus.  V: Temp sensation equal bilaterally  VII: Smile symmetric VIII: HOH IX,X: No hypophonia or hoarseness XI: Lag on the left with shoulder shrug in the context of recent LUE injury XII: Midline tongue extension Motor: RUE 5/5 proximally and distally LUE 5/5 deltoid, triceps, biceps and grip. Unable to assess WF/WE due to splint. There is mild drift on the left in the context of splint and Kerlex with increased weight of the extremity RLE 5/5 LLE 5/5 Sensory: Temp and light touch intact throughout, bilaterally. No extinction to DSS.  Deep Tendon Reflexes: 2+ and symmetric throughout Plantars: Right: downgoing   Left: downgoing Cerebellar: No ataxia with FNF bilaterally Gait: Deferred   Lab Results: Basic Metabolic Panel: Recent Labs  Lab 03/04/21 1331  NA 142  K 4.0  CL 110  CO2 27  GLUCOSE 117*  BUN 15  CREATININE 1.11  CALCIUM 8.8*    CBC: Recent Labs  Lab 03/04/21 1331  WBC 7.9  HGB 14.1  HCT 44.3  MCV 88.1  PLT 364    Cardiac Enzymes: No results for input(s): CKTOTAL, CKMB, CKMBINDEX, TROPONINI in the last 168 hours.  Lipid Panel: No  results for input(s): CHOL, TRIG, HDL, CHOLHDL, VLDL, LDLCALC in the last 168 hours.  Imaging: No results found.   Assessment: 81 y.o. male with a PMHx of DM, hearing loss, hepatitis, small vessel disease and dementia who presents to the ED via EMS as a Code Stroke for new onset of left sided weakness, dizziness and a fall. LKN was 9 PM when he went to bed. About 1 hour later, the patient woke up to go to the bathroom and fell over. When his son tried to help him up, the patient could not move his left arm or leg. The patient recalls that he felt dizzy, which caused his fall. He had a similar event about a week ago and broke his left wrist at that time. Home medications include ASA, pravastatin, memantine and rivastigmine.  1. Examination reveals no definite lateralized weakness. Cannot lift LUE antigravity as high as RUE, which may be due to the weight of the splint he is wearing, versus a new UMN deficit. NIHSS 1.  2. CT head: No acute intracranial abnormality. 3. Risks of TNK are felt to significantly outweigh potential benefits given relatively mild findings as outlined above.  4. Most likely etiology for presentation is felt to be TIA.   Recommendations: 1. HgbA1c, fasting lipid panel 2. MRI/MRA of the brain without contrast 3. PT consult, OT consult, Speech consult 4. Echocardiogram 5. Carotid ultrasound 6. Continue ASA and pravastatin. Start Plavix.   7. Risk factor modification 8. Telemetry monitoring 9. Frequent neuro checks 10. NPO until passes stroke swallow screen 11. Modified permissive HTN x 24 hours. Treat SBP if > 180    Electronically signed: Dr. Kerney Elbe 03/08/2021, 10:21 PM

## 2021-03-09 ENCOUNTER — Observation Stay (HOSPITAL_BASED_OUTPATIENT_CLINIC_OR_DEPARTMENT_OTHER): Payer: Medicare Other

## 2021-03-09 ENCOUNTER — Encounter (HOSPITAL_COMMUNITY): Payer: Self-pay | Admitting: Family Medicine

## 2021-03-09 DIAGNOSIS — I10 Essential (primary) hypertension: Secondary | ICD-10-CM

## 2021-03-09 DIAGNOSIS — G459 Transient cerebral ischemic attack, unspecified: Secondary | ICD-10-CM

## 2021-03-09 DIAGNOSIS — S52602S Unspecified fracture of lower end of left ulna, sequela: Secondary | ICD-10-CM

## 2021-03-09 DIAGNOSIS — S52502S Unspecified fracture of the lower end of left radius, sequela: Secondary | ICD-10-CM | POA: Diagnosis not present

## 2021-03-09 DIAGNOSIS — E119 Type 2 diabetes mellitus without complications: Secondary | ICD-10-CM | POA: Diagnosis not present

## 2021-03-09 DIAGNOSIS — F0390 Unspecified dementia without behavioral disturbance: Secondary | ICD-10-CM | POA: Diagnosis not present

## 2021-03-09 DIAGNOSIS — R531 Weakness: Secondary | ICD-10-CM

## 2021-03-09 DIAGNOSIS — Z8673 Personal history of transient ischemic attack (TIA), and cerebral infarction without residual deficits: Secondary | ICD-10-CM

## 2021-03-09 LAB — CBG MONITORING, ED
Glucose-Capillary: 120 mg/dL — ABNORMAL HIGH (ref 70–99)
Glucose-Capillary: 113 mg/dL — ABNORMAL HIGH (ref 70–99)
Glucose-Capillary: 130 mg/dL — ABNORMAL HIGH (ref 70–99)

## 2021-03-09 LAB — GLUCOSE, CAPILLARY: Glucose-Capillary: 105 mg/dL — ABNORMAL HIGH (ref 70–99)

## 2021-03-09 LAB — LIPID PANEL
Cholesterol: 161 mg/dL (ref 0–200)
HDL: 42 mg/dL (ref >40)
LDL Cholesterol: 96 mg/dL (ref 0–99)
Total CHOL/HDL Ratio: 3.8 RATIO
Triglycerides: 114 mg/dL (ref <150)
VLDL: 23 mg/dL (ref 0–40)

## 2021-03-09 LAB — ECHOCARDIOGRAM COMPLETE
AR max vel: 2.99 cm2
AV Area VTI: 2.83 cm2
AV Area mean vel: 2.8 cm2
AV Mean grad: 3 mmHg
AV Peak grad: 6.3 mmHg
Ao pk vel: 1.25 m/s
Area-P 1/2: 3.27 cm2
Height: 72 in
S' Lateral: 3.7 cm
Weight: 3199.32 oz

## 2021-03-09 LAB — CBC
HCT: 42.2 % (ref 39.0–52.0)
Hemoglobin: 13.9 g/dL (ref 13.0–17.0)
MCH: 29.1 pg (ref 26.0–34.0)
MCHC: 32.9 g/dL (ref 30.0–36.0)
MCV: 88.5 fL (ref 80.0–100.0)
Platelets: 356 10*3/uL (ref 150–400)
RBC: 4.77 MIL/uL (ref 4.22–5.81)
RDW: 12.5 % (ref 11.5–15.5)
WBC: 12.1 10*3/uL — ABNORMAL HIGH (ref 4.0–10.5)
nRBC: 0 % (ref 0.0–0.2)

## 2021-03-09 LAB — RESP PANEL BY RT-PCR (FLU A&B, COVID) ARPGX2
Influenza A by PCR: NEGATIVE
Influenza B by PCR: NEGATIVE
SARS Coronavirus 2 by RT PCR: NEGATIVE

## 2021-03-09 LAB — HEMOGLOBIN A1C
Hgb A1c MFr Bld: 5.8 % — ABNORMAL HIGH (ref 4.8–5.6)
Mean Plasma Glucose: 119.76 mg/dL

## 2021-03-09 MED ORDER — ASPIRIN EC 81 MG PO TBEC
81.0000 mg | DELAYED_RELEASE_TABLET | Freq: Every day | ORAL | Status: DC
  Administered 2021-03-09 – 2021-03-11 (×3): 81 mg via ORAL
  Filled 2021-03-09 (×3): qty 1

## 2021-03-09 MED ORDER — CLOPIDOGREL BISULFATE 75 MG PO TABS
75.0000 mg | ORAL_TABLET | Freq: Every day | ORAL | Status: DC
  Administered 2021-03-09 – 2021-03-11 (×3): 75 mg via ORAL
  Filled 2021-03-09 (×3): qty 1

## 2021-03-09 MED ORDER — STROKE: EARLY STAGES OF RECOVERY BOOK: Freq: Once | Status: DC

## 2021-03-09 MED ORDER — INSULIN ASPART 100 UNIT/ML IJ SOLN: 0.0000 [IU] | Freq: Every day | INTRAMUSCULAR | Status: DC

## 2021-03-09 MED ORDER — ACETAMINOPHEN 325 MG PO TABS
650.0000 mg | ORAL_TABLET | ORAL | Status: DC | PRN
  Administered 2021-03-11: 650 mg via ORAL
  Filled 2021-03-09: qty 2

## 2021-03-09 MED ORDER — ACETAMINOPHEN 160 MG/5ML PO SOLN: 650.0000 mg | ORAL | Status: DC | PRN

## 2021-03-09 MED ORDER — ENOXAPARIN SODIUM 40 MG/0.4ML IJ SOSY
40.0000 mg | PREFILLED_SYRINGE | Freq: Every day | INTRAMUSCULAR | Status: DC
  Administered 2021-03-09 – 2021-03-11 (×3): 40 mg via SUBCUTANEOUS
  Filled 2021-03-09 (×3): qty 0.4

## 2021-03-09 MED ORDER — SODIUM CHLORIDE 0.9 % IV SOLN: INTRAVENOUS | Status: AC

## 2021-03-09 MED ORDER — INSULIN ASPART 100 UNIT/ML IJ SOLN: 0.0000 [IU] | Freq: Three times a day (TID) | INTRAMUSCULAR | Status: DC

## 2021-03-09 MED ORDER — SENNOSIDES-DOCUSATE SODIUM 8.6-50 MG PO TABS: 1.0000 | ORAL_TABLET | Freq: Every evening | ORAL | Status: DC | PRN

## 2021-03-09 MED ORDER — PRAVASTATIN SODIUM 40 MG PO TABS
20.0000 mg | ORAL_TABLET | Freq: Every day | ORAL | Status: DC
  Administered 2021-03-09 – 2021-03-11 (×3): 20 mg via ORAL
  Filled 2021-03-09 (×2): qty 1
  Filled 2021-03-09: qty 2

## 2021-03-09 MED ORDER — ACETAMINOPHEN 650 MG RE SUPP: 650.0000 mg | RECTAL | Status: DC | PRN

## 2021-03-09 MED ORDER — LABETALOL HCL 5 MG/ML IV SOLN
10.0000 mg | INTRAVENOUS | Status: DC | PRN
  Administered 2021-03-10 (×2): 10 mg via INTRAVENOUS
  Filled 2021-03-09 (×2): qty 4

## 2021-03-09 NOTE — Evaluation (Signed)
Physical Therapy Evaluation Patient Details Name: Matthew Knight MRN: [id number] DOB: February 09, 1941 Today's Date: 03/09/2021  History of Present Illness  Pt is a 81 year old man admitted on 03/08/21 with new onset L side weakness and fall. Pt fell last week and fractured his L wrist which is now splinted. Brain MRI negative for acute changes. PMH: DM, dementia, hepatitis, hearing loss, small vessel disease.  Clinical Impression  Patient presents with decreased mobility due to deficits listed in PT problem list below.  Patient originally independent living with spouse and son in multilevel home.  Currently mod A of 2 for up to wheelchair and pivoting steps due to L LE weakness and difficulty progressing the leg.  Patient will benefit from skilled PT in the acute setting and may need follow up acute inpatient rehab prior to d/c home.        Recommendations for follow up therapy are one component of a multi-disciplinary discharge planning process, led by the attending physician.  Recommendations may be updated based on patient status, additional functional criteria and insurance authorization.  Follow Up Recommendations Acute inpatient rehab (3hours/day)    Assistance Recommended at Discharge Frequent or constant Supervision/Assistance  Functional Status Assessment Patient has had a recent decline in their functional status and demonstrates the ability to make significant improvements in function in a reasonable and predictable amount of time.  Equipment Recommendations  None recommended by PT    Recommendations for Other Services Rehab consult     Precautions / Restrictions Precautions Precautions: Fall Restrictions Weight Bearing Restrictions: Yes LUE Weight Bearing: Weight bear through elbow only Other Position/Activity Restrictions: no specific orders for L UE weight bearing, assume through elbow only      Mobility  Bed Mobility Overal bed mobility: Needs Assistance Bed Mobility:  Supine to Sit;Sit to Supine     Supine to sit: Mod assist Sit to supine: Mod assist   General bed mobility comments: assist to raise trunk and for LEs into bed    Transfers Overall transfer level: Needs assistance Equipment used: 2 person hand held assist Transfers: Sit to/from Stand;Bed to chair/wheelchair/BSC Sit to Stand: +2 physical assistance;Min assist;Mod assist   Step pivot transfers: +2 safety/equipment;Min assist;Mod assist       General transfer comment: some lifting assist to rise and steady, pt unable to advance L LE upon initially standing, improved during session; increased time and cues for moving L LE    Ambulation/Gait                  Hotel manager mobility: Yes Wheelchair Assistance Details (indicate cue type and reason): total A to bathroom in wheelchair  Modified Rankin (Stroke Patients Only)       Balance Overall balance assessment: Needs assistance   Sitting balance-Leahy Scale: Fair     Standing balance support: Bilateral upper extremity supported Standing balance-Leahy Scale: Poor Standing balance comment: standing holding grabbar in bathroom with R UE across his body due to L UE limitations for OT to assist with hygiene                             Pertinent Vitals/Pain Pain Assessment: Faces Faces Pain Scale: Hurts little more Pain Location: L wrist Pain Descriptors / Indicators: Discomfort;Tender Pain Intervention(s): Monitored during session;Repositioned    Home Living Family/patient expects to be discharged to:: Private  residence Living Arrangements: Children;Spouse/significant other Available Help at Discharge: Family;Available PRN/intermittently Type of Home: House Home Access: Stairs to enter Entrance Stairs-Rails: Psychiatric nurse of Steps: 6 Alternate Level Stairs-Number of Steps: 7 Home Layout: Multi-level;Bed/bath  upstairs Home Equipment: Grab bars - tub/shower;Grab bars - toilet      Prior Function Prior Level of Function : Independent/Modified Independent                     Hand Dominance   Dominant Hand: Right    Extremity/Trunk Assessment   Upper Extremity Assessment Upper Extremity Assessment: Defer to OT evaluation LUE Deficits / Details: splinted from forearm to MPs LUE: Unable to fully assess due to immobilization LUE Coordination: decreased fine motor    Lower Extremity Assessment Lower Extremity Assessment: RLE deficits/detail;LLE deficits/detail RLE Deficits / Details: AROM WFL, strength at least 4/5 throughout LLE Deficits / Details: AROM WFL, strength 4-/5 LLE Coordination: decreased gross motor    Cervical / Trunk Assessment Cervical / Trunk Assessment: Other exceptions Cervical / Trunk Exceptions: obesity  Communication   Communication: HOH  Cognition Arousal/Alertness: Awake/alert Behavior During Therapy: WFL for tasks assessed/performed Overall Cognitive Status: No family/caregiver present to determine baseline cognitive functioning                                 General Comments: pt with history of dementia per chart, no family available at time of visit, pt able to follow commands with increased time, decreased safety awareness, oriented to self, place and situation, able to give PLOF and home set up        General Comments General comments (skin integrity, edema, etc.): assist for hygiene and changed pants due to his were solied    Exercises     Assessment/Plan    PT Assessment Patient needs continued PT services  PT Problem List Decreased strength;Decreased mobility;Decreased safety awareness;Decreased activity tolerance;Decreased balance;Decreased knowledge of use of DME;Decreased coordination       PT Treatment Interventions DME instruction;Therapeutic activities;Therapeutic exercise;Patient/family education;Gait  training;Balance training;Functional mobility training    PT Goals (Current goals can be found in the Care Plan section)  Acute Rehab PT Goals Patient Stated Goal: to return to independent PT Goal Formulation: With patient Time For Goal Achievement: 03/23/21 Potential to Achieve Goals: Good    Frequency Min 4X/week   Barriers to discharge        Co-evaluation PT/OT/SLP Co-Evaluation/Treatment: Yes Reason for Co-Treatment: For patient/therapist safety;To address functional/ADL transfers PT goals addressed during session: Mobility/safety with mobility OT goals addressed during session: ADL's and self-care       AM-PAC PT "6 Clicks" Mobility  Outcome Measure Help needed turning from your back to your side while in a flat bed without using bedrails?: A Little Help needed moving from lying on your back to sitting on the side of a flat bed without using bedrails?: A Lot Help needed moving to and from a bed to a chair (including a wheelchair)?: A Lot Help needed standing up from a chair using your arms (e.g., wheelchair or bedside chair)?: A Lot Help needed to walk in hospital room?: Total Help needed climbing 3-5 steps with a railing? : Total 6 Click Score: 11    End of Session Equipment Utilized During Treatment: Gait belt Activity Tolerance: Patient limited by fatigue Patient left: in bed;with call bell/phone within reach   PT Visit Diagnosis: Other abnormalities of gait and  mobility (R26.89);Other symptoms and signs involving the nervous system (R29.898);History of falling (Z91.81)    Time: 8478-4128 PT Time Calculation (min) (ACUTE ONLY): 34 min   Charges:   PT Evaluation $PT Eval Moderate Complexity: 1 Mod          Cyndi Krayton Wortley, PT Acute Rehabilitation Services SKSHN:[phone] Office:[phone] 03/09/2021   Reginia Naas 03/09/2021, 3:28 PM

## 2021-03-09 NOTE — H&P (Signed)
History and Physical    Matthew Knight:[id number] DOB: 1940-12-11 DOA: 03/08/2021  PCP: [name], MD   Patient coming from: Home   Chief Complaint: Left side weakness, dizziness, fall   HPI: Matthew Knight is a very pleasant 81 y.o. male with medical history significant for diabetes mellitus, dementia, hepatitis, and recent fall with distal left radius and ulnar fractures, now presenting to the emergency department for evaluation of new left-sided weakness, dizziness, and another fall.  Patient was accompanied by his son who assisted with the history.  The patient appeared to be in his usual state when he went to bed at approximately 9 PM last night but woke an hour later to use the bathroom and fell.  His son was there to help him and noted that the patient was unable to move his left arm or leg.  Initially the patient reported that he had felt dizzy which led to the fall, but is later unable to remember it.  He denies any chest pain, headache, fever or chills, and reports that his left wrist feels fine.  ED Course: Upon arrival to the ED, patient is found to be afebrile, saturating mid 90s on room air, slightly bradycardic, and with stable blood pressure.  EKG features sinus rhythm with RAD.  Head CT negative for acute intracranial abnormality.  MRI brain negative for acute intracranial abnormality.  MRA head negative for LVO.  Chemistry panel and CBC unremarkable.  Neurology was consulted by the ED physician and recommended medical admission.  Review of Systems:  All other systems reviewed and apart from HPI, are negative.  Past Medical History:  Diagnosis Date   Diabetes mellitus without complication (HCC)    Hearing loss    Hepatitis    Small vessel disease ([place])     Past Surgical History:  Procedure Laterality Date   APPENDECTOMY      Social History:   reports that he has been smoking pipe. He has never used smokeless tobacco. He reports that he does not  drink alcohol and does not use drugs.  Allergies  Allergen Reactions   Darunavir Other (See Comments)    Other reaction(s): Eruption of skin   Sulfa Antibiotics     Other reaction(s): Other (See Comments) States made him crazy   No Known Allergies Rash    Family History  Problem Relation Age of Onset   Diabetes Mother    Heart disease Father    Memory loss Paternal Grandfather      Prior to Admission medications   Medication Sig Start Date End Date Taking? Authorizing Provider  amoxicillin (AMOXIL) 500 MG tablet  10/16/19   [provider]  aspirin EC 81 MG tablet Take 81 mg by mouth daily.     [provider]  B Complex Vitamins (VITAMIN-B COMPLEX) TABS Take 1 tablet by mouth daily.     [provider]  baclofen (LIORESAL) 10 MG tablet Take 10 mg by mouth daily.  03/17/17   [provider]  Cholecalciferol (VITAMIN D3) 2000 units capsule Take 2,000 Units by mouth daily.     [provider]  Cyanocobalamin (VITAMIN B-12) 2500 MCG SUBL Take 2,500 mcg by mouth daily.     [provider]  cyclobenzaprine (FLEXERIL) 10 MG tablet Take by mouth. 02/09/13   [provider]  erythromycin ophthalmic ointment  07/23/20   [provider]  escitalopram (LEXAPRO) 20 MG tablet Take 1 tablet by mouth daily. 07/11/17   [provider]  folic acid (FOLVITE) 353 MCG tablet Take 400 mcg by mouth daily. Reported on 05/06/2015    [provider]  ibuprofen (ADVIL) 600 MG tablet Take by mouth. 02/09/13   [provider]  isosorbide mononitrate (IMDUR) 30 MG 24 hr tablet  04/28/20   [provider]  isosorbide mononitrate (IMDUR) 30 MG 24 hr tablet Take 30 mg by mouth daily. 04/28/20   [provider]  ketoconazole (NIZORAL) 2 % cream Apply 1 application topically daily. Between the toes and to the affected area 12/29/17   [place], Stephani Police, DPM  latanoprost (XALATAN) 0.005 % ophthalmic solution  Place 1 drop into both eyes at bedtime.  07/27/17   [provider]  lisinopril (ZESTRIL) 40 MG tablet Take 1 tablet by mouth daily. 02/01/20   [provider]  lisinopril (ZESTRIL) 40 MG tablet Take by mouth. 10/14/20   [provider]  memantine (NAMENDA) 10 MG tablet  11/22/18   [provider]  omeprazole (PRILOSEC) 20 MG capsule  04/07/20   [provider]  omeprazole (PRILOSEC) 20 MG capsule Take 20 mg by mouth daily. 04/07/20   [provider]  oxyCODONE-acetaminophen (PERCOCET/ROXICET) 5-325 MG tablet Take by mouth. 02/09/13   [provider]  pravastatin (PRAVACHOL) 20 MG tablet  04/28/20   [provider]  pravastatin (PRAVACHOL) 20 MG tablet Take 20 mg by mouth daily. 04/28/20   [provider]  RHOPRESSA 0.02 % SOLN  11/15/18   [provider]  rivastigmine (EXELON) 4.6 mg/24hr Place onto the skin. 12/12/19   [provider]  Vitamin E 400 units TABS Take 400 Units by mouth daily.  02/16/13   [provider]    Physical Exam: Vitals:   03/08/21 2300 03/08/21 2315 03/08/21 2330 03/08/21 2345  BP: (!) 143/62 139/68 (!) 147/68 137/65  Pulse: (!) 57 (!) 54 (!) 55 (!) 57  Resp: 16 19 15 17   Temp:      TempSrc:      SpO2: 95% 95% 96% 95%  Weight:        Constitutional: NAD, calm  Eyes: PERTLA, lids and conjunctivae normal ENMT: Mucous membranes are moist. Posterior pharynx clear of any exudate or lesions.   Neck: supple, no masses  Respiratory:  no wheezing, no crackles. No accessory muscle use.  Cardiovascular: S1 & S2 heard, regular rate and rhythm. No extremity edema.   Abdomen: No distension, no tenderness, soft. Bowel sounds active.  Musculoskeletal: no clubbing / cyanosis. No joint deformity upper and lower extremities.   Skin: no significant rashes, lesions, ulcers. Warm, dry, well-perfused. Neurologic: CN 2-12 grossly intact. Sensation to light touch intact. Strength 5/5 in  all 4 limbs. Alert and oriented to person and place only.  Psychiatric: Very pleasant. Cooperative.    Labs and Imaging on Admission: I have personally reviewed following labs and imaging studies  CBC: Recent Labs  Lab 03/04/21 1331 03/08/21 2215 03/08/21 2219  WBC 7.9 10.0  --   NEUTROABS  --  6.0  --   HGB 14.1 13.9 14.6  HCT 44.3 42.6 43.0  MCV 88.1 87.5  --   PLT 364 337  --    Basic Metabolic Panel: Recent Labs  Lab 03/04/21 1331 03/08/21 2215 03/08/21 2219  NA 142 138 141  K 4.0 3.9 3.9  CL 110 108 107  CO2 27 22  --   GLUCOSE 117* 115* 111*  BUN 15 16 20   CREATININE 1.11 1.15 1.00  CALCIUM 8.8* 9.0  --    GFR: Estimated Creatinine Clearance: 65.7 mL/min (by C-G formula based on SCr of 1 mg/dL). Liver Function Tests: Recent Labs  Lab 03/08/21 2215  AST 21  ALT 24  ALKPHOS 72  BILITOT 0.5  PROT 6.7  ALBUMIN 3.5   No results for input(s): LIPASE, AMYLASE in the last 168 hours. No results for input(s): AMMONIA in the last 168 hours. Coagulation Profile: Recent Labs  Lab 03/08/21 2215  INR 0.9   Cardiac Enzymes: No results for input(s): CKTOTAL, CKMB, CKMBINDEX, TROPONINI in the last 168 hours. BNP (last 3 results) No results for input(s): PROBNP in the last 8760 hours. HbA1C: No results for input(s): HGBA1C in the last 72 hours. CBG: Recent Labs  Lab 03/04/21 1331 03/08/21 2214  GLUCAP 97 111*   Lipid Profile: No results for input(s): CHOL, HDL, LDLCALC, TRIG, CHOLHDL, LDLDIRECT in the last 72 hours. Thyroid Function Tests: No results for input(s): TSH, T4TOTAL, FREET4, T3FREE, THYROIDAB in the last 72 hours. Anemia Panel: No results for input(s): VITAMINB12, FOLATE, FERRITIN, TIBC, IRON, RETICCTPCT in the last 72 hours. Urine analysis:    Component Value Date/Time   COLORURINE YELLOW 11/10/2017 1848   APPEARANCEUR CLEAR 11/10/2017 1848   LABSPEC 1.023 11/10/2017 1848   PHURINE 7.0 11/10/2017 1848   GLUCOSEU NEGATIVE 11/10/2017 1848    HGBUR NEGATIVE 11/10/2017 1848   BILIRUBINUR NEGATIVE 11/10/2017 1848   KETONESUR NEGATIVE 11/10/2017 1848   PROTEINUR NEGATIVE 11/10/2017 1848   NITRITE NEGATIVE 11/10/2017 1848   LEUKOCYTESUR NEGATIVE 11/10/2017 1848   Sepsis Labs: @LABRCNTIP (procalcitonin:4,lacticidven:4) )No results found for this or any previous visit (from the past 240 hour(s)).   Radiological Exams on Admission: DG Wrist Complete Left  Result Date: 03/08/2021 CLINICAL DATA:  Fall. EXAM: LEFT WRIST - COMPLETE 3+ VIEW COMPARISON:  Left wrist x-ray 03/04/2021. FINDINGS: There is a new splint. Comminuted distal radius fracture with intra-articular extension is again seen. Alignment is near anatomic and unchanged. Mild apex anterior angulation persists. Nondisplaced ulnar styloid fracture is also unchanged. There is no dislocation. Soft tissue swelling surrounding the wrist persists. IMPRESSION: 1. Comminuted distal radius fracture is in near anatomic alignment. Minimal apex anterior angulation persists. 2. Nondisplaced ulnar styloid fracture is unchanged. 3. Soft tissue swelling surrounding wrist. Electronically Signed   By: Ronney Asters M.D.   On: 03/08/2021 23:31   MR ANGIO HEAD WO CONTRAST  Result Date: 03/09/2021 CLINICAL DATA:  Initial evaluation for neuro deficit, stroke suspected. EXAM: MRI HEAD WITHOUT CONTRAST MRA HEAD WITHOUT CONTRAST TECHNIQUE: Multiplanar, multi-echo pulse sequences of the brain and surrounding structures were acquired without intravenous contrast. Angiographic images of the Circle of Willis were acquired using MRA technique without intravenous contrast. COMPARISON:  Prior CT from 03/08/2021. FINDINGS: MRI HEAD FINDINGS Brain: Diffuse prominence of the CSF containing spaces compatible generalized cerebral atrophy. Patchy and confluent T2/FLAIR hyperintensity involving the periventricular and deep white matter blue cerebral hemispheres most consistent with chronic small vessel ischemic disease, mild  to moderate in nature. Few remote lacunar infarcts present about the thalami. No abnormal foci of restricted diffusion to suggest acute or subacute ischemia. Gray-white matter differentiation maintained. No encephalomalacia to suggest chronic cortical infarction. No acute intracranial hemorrhage. 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 No other evidence for chronic intracranial hemorrhage. No mass lesion, midline shift or mass effect. Mild ventricular prominence related to global parenchymal volume loss without hydrocephalus. No extra-axial fluid collection. Pituitary gland suprasellar  region within normal limits. Midline structures intact. Vascular: Major intracranial vascular flow voids are maintained. Skull and upper cervical spine: Craniocervical junction within normal limits. Bone marrow signal intensity normal. No scalp soft tissue abnormality. Sinuses/Orbits: Globes and orbital soft tissues within normal limits. Chronic left maxillary sinusitis. Scattered mucosal thickening noted elsewhere throughout the paranasal sinuses. Trace bilateral mastoid effusions. Visualized nasopharynx unremarkable. Inner ear structures grossly normal. Other: None. MRA HEAD FINDINGS Anterior circulation: Visualized distal cervical segments of the internal carotid arteries are patent with antegrade flow. Right ICA widely patent to the terminus. There is focal mild-to-moderate stenosis involving the supraclinoid left ICA just prior to the terminus (series 1, image 98). Left ICA otherwise widely patent. A1 segments patent. Normal anterior communicating artery complex. Anterior cerebral arteries patent without stenosis. No M1 stenosis or occlusion. Normal MCA bifurcations. Distal MCA branches well perfused and symmetric. Posterior circulation: Visualized V4 segments patent without stenosis. Both PICA patent. Basilar patent to its distal aspect without stenosis. Superior cerebral  arteries patent bilaterally. Right PCA primarily supplied via the basilar. Fetal type origin of the left PCA. 3 mm funnel shaped outpouching extending from the left aspect of the basilar tip most likely reflects a vascular infundibulum related to a hypoplastic left P1 segment. Both PCAs widely patent to their distal aspects. Anatomic variants: Fetal type origin of the left PCA.  No aneurysm. IMPRESSION: MRI HEAD IMPRESSION: 1. No acute intracranial abnormality. 2. Generalized cerebral atrophy with mild to moderate chronic microvascular ischemic disease. 3. Chronic left maxillary sinusitis. MRA HEAD IMPRESSION: 1. Negative intracranial MRA for large vessel occlusion. 2. 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 No other hemodynamically significant or correctable stenosis identified. Electronically Signed   By: Jeannine Boga M.D.   On: 03/09/2021 01:06   MR BRAIN WO CONTRAST  Result Date: 03/09/2021 CLINICAL DATA:  Initial evaluation for neuro deficit, stroke suspected. EXAM: MRI HEAD WITHOUT CONTRAST MRA HEAD WITHOUT CONTRAST TECHNIQUE: Multiplanar, multi-echo pulse sequences of the brain and surrounding structures were acquired without intravenous contrast. Angiographic images of the Circle of Willis were acquired using MRA technique without intravenous contrast. COMPARISON:  Prior CT from 03/08/2021. FINDINGS: MRI HEAD FINDINGS Brain: Diffuse prominence of the CSF containing spaces compatible generalized cerebral atrophy. Patchy and confluent T2/FLAIR hyperintensity involving the periventricular and deep white matter blue cerebral hemispheres most consistent with chronic small vessel ischemic disease, mild to moderate in nature. Few remote lacunar infarcts present about the thalami. No abnormal foci of restricted diffusion to suggest acute or subacute ischemia. Gray-white matter differentiation maintained. No encephalomalacia to suggest chronic  cortical infarction. No acute intracranial hemorrhage. 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 No other evidence for chronic intracranial hemorrhage. No mass lesion, midline shift or mass effect. Mild ventricular prominence related to global parenchymal volume loss without hydrocephalus. No extra-axial fluid collection. Pituitary gland suprasellar region within normal limits. Midline structures intact. Vascular: Major intracranial vascular flow voids are maintained. Skull and upper cervical spine: Craniocervical junction within normal limits. Bone marrow signal intensity normal. No scalp soft tissue abnormality. Sinuses/Orbits: Globes and orbital soft tissues within normal limits. Chronic left maxillary sinusitis. Scattered mucosal thickening noted elsewhere throughout the paranasal sinuses. Trace bilateral mastoid effusions. Visualized nasopharynx unremarkable. Inner ear structures grossly normal. Other: None. MRA HEAD FINDINGS Anterior circulation: Visualized distal cervical segments of the internal carotid arteries are patent with antegrade flow. Right ICA widely patent to  the terminus. There is focal mild-to-moderate stenosis involving the supraclinoid left ICA just prior to the terminus (series 1, image 98). Left ICA otherwise widely patent. A1 segments patent. Normal anterior communicating artery complex. Anterior cerebral arteries patent without stenosis. No M1 stenosis or occlusion. Normal MCA bifurcations. Distal MCA branches well perfused and symmetric. Posterior circulation: Visualized V4 segments patent without stenosis. Both PICA patent. Basilar patent to its distal aspect without stenosis. Superior cerebral arteries patent bilaterally. Right PCA primarily supplied via the basilar. Fetal type origin of the left PCA. 3 mm funnel shaped outpouching extending from the left aspect of the basilar tip most likely reflects a vascular infundibulum related  to a hypoplastic left P1 segment. Both PCAs widely patent to their distal aspects. Anatomic variants: Fetal type origin of the left PCA.  No aneurysm. IMPRESSION: MRI HEAD IMPRESSION: 1. No acute intracranial abnormality. 2. Generalized cerebral atrophy with mild to moderate chronic microvascular ischemic disease. 3. Chronic left maxillary sinusitis. MRA HEAD IMPRESSION: 1. Negative intracranial MRA for large vessel occlusion. 2. 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 No other hemodynamically significant or correctable stenosis identified. Electronically Signed   By: Jeannine Boga M.D.   On: 03/09/2021 01:06   CT HEAD CODE STROKE WO CONTRAST  Result Date: 03/08/2021 CLINICAL DATA:  Code stroke. Initial evaluation for acute left-sided weakness. EXAM: CT HEAD WITHOUT CONTRAST TECHNIQUE: Contiguous axial images were obtained from the base of the skull through the vertex without intravenous contrast. COMPARISON:  Prior CT from 10/20/2018. FINDINGS: Brain: Age-related cerebral atrophy with chronic microvascular ischemic disease. Scattered multifocal areas of calcification/mineralization about the bilateral thalami noted, similar to previous. No acute intracranial hemorrhage. No acute large vessel territory infarct. No mass lesion, midline shift or mass effect. Stable ventricular size and morphology without hydrocephalus. No extra-axial fluid collection. Vascular: No hyperdense vessel. Scattered vascular calcifications noted within the carotid siphons. Skull: Scalp soft tissues and calvarium within normal limits. Sinuses/Orbits: Globes orbital soft tissues demonstrate no acute finding. Chronic left maxillary sinusitis noted. Paranasal sinuses are otherwise largely clear. No mastoid effusion. Other: None. ASPECTS [facility] Stroke Program Early CT Score) - Ganglionic level infarction (caudate, lentiform nuclei, internal capsule, insula, M1-M3 cortex):  7 - Supraganglionic infarction (M4-M6 cortex): 3 Total score (0-10 with 10 being normal): 10 IMPRESSION: 1. No acute intracranial infarct or other abnormality. 2. ASPECTS is 10. 3. Age-related cerebral atrophy with chronic microvascular ischemic disease. Superimposed scattered multifocal areas of calcification/mineralization about the bilateral thalami, stable. These results were communicated to Dr. Cheral Marker at 10:38 pm on 03/08/2021 by text page via the [facility] messaging system. Electronically Signed   By: Jeannine Boga M.D.   On: 03/08/2021 22:40    EKG: Independently reviewed. Sinus rhythm, RAD.   Assessment/Plan   1. Acute left-sided weakness; suspected TIA  - Presents with left arm and leg weakness, dizziness, and fall after seen well ~1 hr earlier  - No acute findings on head CT or MRI, and no LVO on MRA head in ED  - Appreciate neurology consultation  - Continue cardiac monitoring, frequent neuro checks  - Check echo, carotid US, A1c, and lipids - Consult PT/OT/SLP - Continue ASA and statin, start Plavix    2. Type II DM  - Check CBGs and use sliding-scale insulin for now   3. Hypertension  - Treat only as needed for SBP >180 for now per neurology recommendation    4. Dementia  - Calm and  cooperative in ED, oriented to person and place only  - Delirium precautions    5. Distal left radius and ulna fractures  - Stable for outpatient follow-up with hand surgery as planned    DVT prophylaxis: Full  Code Status: Full  Level of Care: Level of care: Telemetry Medical Family Communication: son updated in ED  Disposition Plan:  Patient is from: Home  Anticipated d/c is to: TBD Anticipated d/c date is: 03/10/21  Patient currently: Pending CUS, echo, fasting lipids, A1c, therapy assessments  Consults called: neurology  Admission status: Observation     Vianne Bulls, MD [facility]  03/09/2021, 4:08 AM

## 2021-03-09 NOTE — Assessment & Plan Note (Signed)
--   Appears stable.  Given second episode of possible dizziness and fall, will consider discontinuation of this medication as it can cause syncope, bradycardia and hypotension.

## 2021-03-09 NOTE — Progress Notes (Addendum)
STROKE TEAM PROGRESS NOTE   INTERVAL HISTORY Patient is seen in his room with no family at the bedside.  He was admitted after a fall with left sided weakness last night.  Patient has poor recollection of the fall and states he felt like he just blacked out.  Of note, he had fractured his left wrist in a similar fall about one week ago.  Left-sided weakness has resolved, and MRI was negative for acute stroke.  MR angiogram of the brain is negative for any large vessel stenosis.  LDL cholesterol 96 mg percent and hemoglobin A1c 5.8. Echocardiogram shows normal ejection fraction without cardiac source of embolism.  Carotid ultrasound shows no significant bilateral extracranial stenosis. Vitals:   03/09/21 0730 03/09/21 0747 03/09/21 0900 03/09/21 1145  BP: (!) 150/66  (!) 163/81 (!) 156/105  Pulse: (!) 59  72 71  Resp: 17  (!) 23 18  Temp:      TempSrc:      SpO2: 91%  93% 95%  Weight:  90.7 kg    Height:  6' (1.829 m)     CBC:  Recent Labs  Lab 03/08/21 2215 03/08/21 2219 03/09/21 0700  WBC 10.0  --  12.1*  NEUTROABS 6.0  --   --   HGB 13.9 14.6 13.9  HCT 42.6 43.0 42.2  MCV 87.5  --  88.5  PLT 337  --  357   Basic Metabolic Panel:  Recent Labs  Lab 03/04/21 1331 03/08/21 2215 03/08/21 2219  NA 142 138 141  K 4.0 3.9 3.9  CL 110 108 107  CO2 27 22  --   GLUCOSE 117* 115* 111*  BUN 15 16 20   CREATININE 1.11 1.15 1.00  CALCIUM 8.8* 9.0  --    Lipid Panel:  Recent Labs  Lab 03/09/21 0700  CHOL 161  TRIG 114  HDL 42  CHOLHDL 3.8  VLDL 23  LDLCALC 96   HgbA1c:  Recent Labs  Lab 03/09/21 0700  HGBA1C 5.8*   Urine Drug Screen: No results for input(s): LABOPIA, COCAINSCRNUR, LABBENZ, AMPHETMU, THCU, LABBARB in the last 168 hours.  Alcohol Level No results for input(s): ETH in the last 168 hours.  IMAGING past 24 hours DG Wrist Complete Left  Result Date: 03/08/2021 CLINICAL DATA:  Fall. EXAM: LEFT WRIST - COMPLETE 3+ VIEW COMPARISON:  Left wrist x-ray  03/04/2021. FINDINGS: There is a new splint. Comminuted distal radius fracture with intra-articular extension is again seen. Alignment is near anatomic and unchanged. Mild apex anterior angulation persists. Nondisplaced ulnar styloid fracture is also unchanged. There is no dislocation. Soft tissue swelling surrounding the wrist persists. IMPRESSION: 1. Comminuted distal radius fracture is in near anatomic alignment. Minimal apex anterior angulation persists. 2. Nondisplaced ulnar styloid fracture is unchanged. 3. Soft tissue swelling surrounding wrist. Electronically Signed   By: Ronney Asters M.D.   On: 03/08/2021 23:31   MR ANGIO HEAD WO CONTRAST  Result Date: 03/09/2021 CLINICAL DATA:  Initial evaluation for neuro deficit, stroke suspected. EXAM: MRI HEAD WITHOUT CONTRAST MRA HEAD WITHOUT CONTRAST TECHNIQUE: Multiplanar, multi-echo pulse sequences of the brain and surrounding structures were acquired without intravenous contrast. Angiographic images of the Circle of Willis were acquired using MRA technique without intravenous contrast. COMPARISON:  Prior CT from 03/08/2021. FINDINGS: MRI HEAD FINDINGS Brain: Diffuse prominence of the CSF containing spaces compatible generalized cerebral atrophy. Patchy and confluent T2/FLAIR hyperintensity involving the periventricular and deep white matter blue cerebral hemispheres most consistent with chronic small vessel  ischemic disease, mild to moderate in nature. Few remote lacunar infarcts present about the thalami. No abnormal foci of restricted diffusion to suggest acute or subacute ischemia. Gray-white matter differentiation maintained. No encephalomalacia to suggest chronic cortical infarction. No acute intracranial hemorrhage. 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 No other evidence for chronic intracranial hemorrhage. No mass lesion, midline shift or mass effect. Mild ventricular prominence related to  global parenchymal volume loss without hydrocephalus. No extra-axial fluid collection. Pituitary gland suprasellar region within normal limits. Midline structures intact. Vascular: Major intracranial vascular flow voids are maintained. Skull and upper cervical spine: Craniocervical junction within normal limits. Bone marrow signal intensity normal. No scalp soft tissue abnormality. Sinuses/Orbits: Globes and orbital soft tissues within normal limits. Chronic left maxillary sinusitis. Scattered mucosal thickening noted elsewhere throughout the paranasal sinuses. Trace bilateral mastoid effusions. Visualized nasopharynx unremarkable. Inner ear structures grossly normal. Other: None. MRA HEAD FINDINGS Anterior circulation: Visualized distal cervical segments of the internal carotid arteries are patent with antegrade flow. Right ICA widely patent to the terminus. There is focal mild-to-moderate stenosis involving the supraclinoid left ICA just prior to the terminus (series 1, image 98). Left ICA otherwise widely patent. A1 segments patent. Normal anterior communicating artery complex. Anterior cerebral arteries patent without stenosis. No M1 stenosis or occlusion. Normal MCA bifurcations. Distal MCA branches well perfused and symmetric. Posterior circulation: Visualized V4 segments patent without stenosis. Both PICA patent. Basilar patent to its distal aspect without stenosis. Superior cerebral arteries patent bilaterally. Right PCA primarily supplied via the basilar. Fetal type origin of the left PCA. 3 mm funnel shaped outpouching extending from the left aspect of the basilar tip most likely reflects a vascular infundibulum related to a hypoplastic left P1 segment. Both PCAs widely patent to their distal aspects. Anatomic variants: Fetal type origin of the left PCA.  No aneurysm. IMPRESSION: MRI HEAD IMPRESSION: 1. No acute intracranial abnormality. 2. Generalized cerebral atrophy with mild to moderate chronic  microvascular ischemic disease. 3. Chronic left maxillary sinusitis. MRA HEAD IMPRESSION: 1. Negative intracranial MRA for large vessel occlusion. 2. 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 No other hemodynamically significant or correctable stenosis identified. Electronically Signed   By: Jeannine Boga M.D.   On: 03/09/2021 01:06   MR BRAIN WO CONTRAST  Result Date: 03/09/2021 CLINICAL DATA:  Initial evaluation for neuro deficit, stroke suspected. EXAM: MRI HEAD WITHOUT CONTRAST MRA HEAD WITHOUT CONTRAST TECHNIQUE: Multiplanar, multi-echo pulse sequences of the brain and surrounding structures were acquired without intravenous contrast. Angiographic images of the Circle of Willis were acquired using MRA technique without intravenous contrast. COMPARISON:  Prior CT from 03/08/2021. FINDINGS: MRI HEAD FINDINGS Brain: Diffuse prominence of the CSF containing spaces compatible generalized cerebral atrophy. Patchy and confluent T2/FLAIR hyperintensity involving the periventricular and deep white matter blue cerebral hemispheres most consistent with chronic small vessel ischemic disease, mild to moderate in nature. Few remote lacunar infarcts present about the thalami. No abnormal foci of restricted diffusion to suggest acute or subacute ischemia. Gray-white matter differentiation maintained. No encephalomalacia to suggest chronic cortical infarction. No acute intracranial hemorrhage. 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 No other evidence for chronic intracranial hemorrhage. No mass lesion, midline shift or mass effect. Mild ventricular prominence related to global parenchymal volume loss without hydrocephalus. No extra-axial fluid collection. Pituitary gland suprasellar region within normal limits. Midline  structures intact. Vascular: Major intracranial vascular flow voids are maintained.  Skull and upper cervical spine: Craniocervical junction within normal limits. Bone marrow signal intensity normal. No scalp soft tissue abnormality. Sinuses/Orbits: Globes and orbital soft tissues within normal limits. Chronic left maxillary sinusitis. Scattered mucosal thickening noted elsewhere throughout the paranasal sinuses. Trace bilateral mastoid effusions. Visualized nasopharynx unremarkable. Inner ear structures grossly normal. Other: None. MRA HEAD FINDINGS Anterior circulation: Visualized distal cervical segments of the internal carotid arteries are patent with antegrade flow. Right ICA widely patent to the terminus. There is focal mild-to-moderate stenosis involving the supraclinoid left ICA just prior to the terminus (series 1, image 98). Left ICA otherwise widely patent. A1 segments patent. Normal anterior communicating artery complex. Anterior cerebral arteries patent without stenosis. No M1 stenosis or occlusion. Normal MCA bifurcations. Distal MCA branches well perfused and symmetric. Posterior circulation: Visualized V4 segments patent without stenosis. Both PICA patent. Basilar patent to its distal aspect without stenosis. Superior cerebral arteries patent bilaterally. Right PCA primarily supplied via the basilar. Fetal type origin of the left PCA. 3 mm funnel shaped outpouching extending from the left aspect of the basilar tip most likely reflects a vascular infundibulum related to a hypoplastic left P1 segment. Both PCAs widely patent to their distal aspects. Anatomic variants: Fetal type origin of the left PCA.  No aneurysm. IMPRESSION: MRI HEAD IMPRESSION: 1. No acute intracranial abnormality. 2. Generalized cerebral atrophy with mild to moderate chronic microvascular ischemic disease. 3. Chronic left maxillary sinusitis. MRA HEAD IMPRESSION: 1. Negative intracranial MRA for large vessel occlusion. 2. 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 No other hemodynamically significant or correctable stenosis identified. Electronically Signed   By: Jeannine Boga M.D.   On: 03/09/2021 01:06   ECHOCARDIOGRAM COMPLETE  Result Date: 03/09/2021    ECHOCARDIOGRAM REPORT   Patient Name:   Matthew Knight Date of Exam: 03/09/2021 Medical Rec #:  [id number]             Height:       72.0 in Accession #:    [id number]            Weight:       200.0 lb Date of Birth:  May 28, 1940              BSA:          2.130 m Patient Age:    25 years              BP:           163/81 mmHg Patient Gender: M                     HR:           59 bpm. Exam Location:  Inpatient Procedure: 2D Echo, Color Doppler and Cardiac Doppler Indications:    Stroke i63.9  History:        Patient has prior history of Echocardiogram examinations, most                 recent 11/11/2017. Risk Factors:Hypertension and Diabetes.  Sonographer:    Arlyss Gandy Referring Phys: [id number] [place]  1. Left ventricular ejection fraction, by estimation, is 60 to 65%. The left ventricle has normal function. The left ventricle has no regional wall motion abnormalities. There is moderate left ventricular hypertrophy. Left ventricular diastolic parameters are consistent with Grade I diastolic dysfunction (impaired  relaxation).  2. Right ventricular systolic function is normal. The right ventricular size is normal.  3. The mitral valve is normal in structure. No evidence of mitral valve regurgitation. No evidence of mitral stenosis.  4. The aortic valve is tricuspid. There is mild calcification of the aortic valve. There is mild thickening of the aortic valve. Aortic valve regurgitation is mild. FINDINGS  Left Ventricle: Left ventricular ejection fraction, by estimation, is 60 to 65%. The left ventricle has normal function. The left ventricle has no regional wall motion abnormalities. The left ventricular internal cavity size was normal in size. There is  moderate left ventricular  hypertrophy. Left ventricular diastolic parameters are consistent with Grade I diastolic dysfunction (impaired relaxation). Normal left ventricular filling pressure. Right Ventricle: The right ventricular size is normal. No increase in right ventricular wall thickness. Right ventricular systolic function is normal. Left Atrium: Left atrial size was normal in size. Right Atrium: Right atrial size was normal in size. Pericardium: There is no evidence of pericardial effusion. Mitral Valve: The mitral valve is normal in structure. No evidence of mitral valve regurgitation. No evidence of mitral valve stenosis. Tricuspid Valve: The tricuspid valve is normal in structure. Tricuspid valve regurgitation is not demonstrated. No evidence of tricuspid stenosis. Aortic Valve: The aortic valve is tricuspid. There is mild calcification of the aortic valve. There is mild thickening of the aortic valve. There is mild aortic valve annular calcification. Aortic valve regurgitation is mild. Aortic valve mean gradient measures 3.0 mmHg. Aortic valve peak gradient measures 6.2 mmHg. Aortic valve area, by VTI measures 2.83 cm. Pulmonic Valve: The pulmonic valve was not well visualized. Pulmonic valve regurgitation is not visualized. No evidence of pulmonic stenosis. Aorta: The aortic root is normal in size and structure. IAS/Shunts: No atrial level shunt detected by color flow Doppler.  LEFT VENTRICLE PLAX 2D LVIDd:         5.20 cm   Diastology LVIDs:         3.70 cm   LV e' medial:    7.83 cm/s LV PW:         1.30 cm   LV E/e' medial:  9.2 LV IVS:        1.30 cm   LV e' lateral:   8.59 cm/s LVOT diam:     2.00 cm   LV E/e' lateral: 8.4 LV SV:         77 LV SV Index:   36 LVOT Area:     3.14 cm  RIGHT VENTRICLE RV S prime:     14.60 cm/s TAPSE (M-mode): 1.9 cm LEFT ATRIUM             Index LA Vol (A2C):   55.8 ml 26.19 ml/m LA Vol (A4C):   47.2 ml 22.16 ml/m LA Biplane Vol: 52.4 ml 24.60 ml/m  AORTIC VALVE                     PULMONIC VALVE AV Area (Vmax):    2.99 cm     PV Vmax:       1.20 m/s AV Area (Vmean):   2.80 cm     PV Peak grad:  5.8 mmHg AV Area (VTI):     2.83 cm AV Vmax:           125.00 cm/s AV Vmean:          83.400 cm/s AV VTI:  0.272 m AV Peak Grad:      6.2 mmHg AV Mean Grad:      3.0 mmHg LVOT Vmax:         119.00 cm/s LVOT Vmean:        74.400 cm/s LVOT VTI:          0.245 m LVOT/AV VTI ratio: 0.90  AORTA Ao Root diam: 3.10 cm Ao Asc diam:  3.10 cm MITRAL VALVE MV Area (PHT): 3.27 cm     SHUNTS MV Decel Time: 232 msec     Systemic VTI:  0.24 m MV E velocity: 72.30 cm/s   Systemic Diam: 2.00 cm MV A velocity: 110.00 cm/s MV E/A ratio:  0.66 Carlyle Dolly MD Electronically signed by Carlyle Dolly MD Signature Date/Time: 03/09/2021/11:32:54 AM    Final    CT HEAD CODE STROKE WO CONTRAST  Result Date: 03/08/2021 CLINICAL DATA:  Code stroke. Initial evaluation for acute left-sided weakness. EXAM: CT HEAD WITHOUT CONTRAST TECHNIQUE: Contiguous axial images were obtained from the base of the skull through the vertex without intravenous contrast. COMPARISON:  Prior CT from 10/20/2018. FINDINGS: Brain: Age-related cerebral atrophy with chronic microvascular ischemic disease. Scattered multifocal areas of calcification/mineralization about the bilateral thalami noted, similar to previous. No acute intracranial hemorrhage. No acute large vessel territory infarct. No mass lesion, midline shift or mass effect. Stable ventricular size and morphology without hydrocephalus. No extra-axial fluid collection. Vascular: No hyperdense vessel. Scattered vascular calcifications noted within the carotid siphons. Skull: Scalp soft tissues and calvarium within normal limits. Sinuses/Orbits: Globes orbital soft tissues demonstrate no acute finding. Chronic left maxillary sinusitis noted. Paranasal sinuses are otherwise largely clear. No mastoid effusion. Other: None. ASPECTS [facility] Stroke Program Early CT Score) -  Ganglionic level infarction (caudate, lentiform nuclei, internal capsule, insula, M1-M3 cortex): 7 - Supraganglionic infarction (M4-M6 cortex): 3 Total score (0-10 with 10 being normal): 10 IMPRESSION: 1. No acute intracranial infarct or other abnormality. 2. ASPECTS is 10. 3. Age-related cerebral atrophy with chronic microvascular ischemic disease. Superimposed scattered multifocal areas of calcification/mineralization about the bilateral thalami, stable. These results were communicated to Dr. Cheral Marker at 10:38 pm on 03/08/2021 by text page via the [facility] messaging system. Electronically Signed   By: Jeannine Boga M.D.   On: 03/08/2021 22:40   VAS US CAROTID (at [facility] and [facility] only)  Result Date: 03/09/2021 Carotid Arterial Duplex Study Patient Name:  Matthew Knight  Date of Exam:   03/09/2021 Medical Rec #: [id number]              Accession #:    [id number] Date of Birth: 04-23-1940               Patient Gender: M Patient Age:   40 years Exam Location:  [facility] Procedure:      VAS US CAROTID Referring Phys: Christia Reading OPYD --------------------------------------------------------------------------------  Indications:       CVA. Risk Factors:      Hypertension, Diabetes, current smoker, prior CVA. Limitations        Today's exam was limited due to the patient's respiratory                    variation. Comparison Study:  11-11-2017 Carotid duplex showed 1-39% stenosis bilaterally. Performing Technologist: Darlin Coco RDMS, RVT  Examination Guidelines: A complete evaluation includes B-mode imaging, spectral Doppler, color Doppler, and power Doppler as needed of all accessible portions of each vessel. Bilateral testing is considered an integral part  of a complete examination. Limited examinations for reoccurring indications may be performed as noted.  Right Carotid Findings: +----------+--------+--------+--------+------------------+--------+             PSV cm/s EDV cm/s Stenosis Plaque  Description Comments  +----------+--------+--------+--------+------------------+--------+  CCA Prox   122      15                                             +----------+--------+--------+--------+------------------+--------+  CCA Distal 78       9                                              +----------+--------+--------+--------+------------------+--------+  ICA Prox   89       15       1-39%    heterogenous                 +----------+--------+--------+--------+------------------+--------+  ICA Distal 67       10                                             +----------+--------+--------+--------+------------------+--------+  ECA        120                                                     +----------+--------+--------+--------+------------------+--------+ +----------+--------+-------+----------------+-------------------+             PSV cm/s EDV cms Describe         Arm Pressure (mmHG)  +----------+--------+-------+----------------+-------------------+  Subclavian 90               Multiphasic, WNL                      +----------+--------+-------+----------------+-------------------+ +---------+--------+--+--------+--+---------+  Vertebral PSV cm/s 56 EDV cm/s 11 Antegrade  +---------+--------+--+--------+--+---------+  Left Carotid Findings: +----------+--------+--------+--------+------------------+--------+             PSV cm/s EDV cm/s Stenosis Plaque Description Comments  +----------+--------+--------+--------+------------------+--------+  CCA Prox   114      10                                             +----------+--------+--------+--------+------------------+--------+  CCA Distal 75       10                                             +----------+--------+--------+--------+------------------+--------+  ICA Prox   107      16       1-39%    heterogenous                 +----------+--------+--------+--------+------------------+--------+  ICA Distal 62       13                                              +----------+--------+--------+--------+------------------+--------+  ECA        145      11                                             +----------+--------+--------+--------+------------------+--------+ +----------+--------+--------+--------+-------------------+             PSV cm/s EDV cm/s Describe Arm Pressure (mmHG)  +----------+--------+--------+--------+-------------------+  Subclavian 245               Stenotic                      +----------+--------+--------+--------+-------------------+ +---------+--------+--+--------+--+---------+  Vertebral PSV cm/s 73 EDV cm/s 13 Antegrade  +---------+--------+--+--------+--+---------+   Summary: Right Carotid: Velocities in the right ICA are consistent with a 1-39% stenosis. Left Carotid: Velocities in the left ICA are consistent with a 1-39% stenosis. Vertebrals:  Bilateral vertebral arteries demonstrate antegrade flow. Subclavians: Left subclavian artery was stenotic. Normal flow hemodynamics were              seen in the right subclavian artery. *See table(s) above for measurements and observations.     Preliminary     PHYSICAL EXAM General:  Alert, well-developed, well-nourished elderly Caucasian male in no acute distress.  Left forearm has bandage from recent wrist fracture   NEURO:  Mental Status: AA&Ox2  Speech/Language: speech is without dysarthria or aphasia.   Diminished attention, registration and recall.  Poor insight into his condition. Cranial Nerves:  II: PERRL. Visual fields full.  III, IV, VI: EOMI. Eyelids elevate symmetrically.  V: Sensation is intact to light touch and symmetrical to face.  VII: Smile is symmetrical.  VIII: hearing intact to voice. IX, X: Phonation is normal.  XII: tongue is midline without fasciculations. Motor: 5/5 strength to all muscle groups tested.  Sensation- Intact to light touch bilaterally. Extinction absent to light touch to DSS.  Coordination: FTN intact bilaterally, HKS: no ataxia in BLE.No drift.   Gait- deferred    ASSESSMENT/PLAN Matthew Knight is a 81 y.o. male with history of DM, hearing loss, dementia and hepatitis presenting with left sided weakness after a fall. Patient has poor recollection of the fall and states he felt like he just blacked out.  Of note, he had fractured his left wrist in a similar fall about one week ago.  Left-sided weakness has resolved, and MRI was negative for acute stroke.  TIA:  right hemispheric TIA secondary to small vessel disease likely Code Stroke CT head No acute abnormality. Small vessel disease. Atrophy. ASPECTS 10.    MRI  No acute intracranial abnormality, generalized cerebral atrophy with mild to moderate microvascular ischemic disease MRA  Negative for LVO, mild to moderate atheromatous stenosis in supraclinoid left ICA Carotid Doppler  1-39% stenosis in bilateral ICAs 2D Echo EF 60-65%, moderate LVH, grade 1 diastolic dysfunction, no atrial level shunt LDL 96 HgbA1c 5.8 VTE prophylaxis - lovenox    Diet   Diet regular Room service appropriate? Yes; Fluid consistency: Thin   No antithrombotic prior to admission, now on aspirin 81 mg daily and clopidogrel 75 mg daily.x 3 weeks followed by aspirin alone Therapy recommendations: CLR  disposition:  pending  Hypertension Home meds:  lisinopril 40 mg daily Stable Keep SBP <180 Long-term BP goal normotensive  Hyperlipidemia Home meds:  none, LDL 96, goal < 70 Add pravastatin 20 mg daily  Continue  statin at discharge   Other Stroke Risk Factors Advanced Age >/= 6  Cigarette smoker advised to stop smoking  Other Active Problems Dementia Reorient PRN Delirium precautions  Hospital day # Page , MSN, AGACNP-BC [facility] See Amion for schedule and pager information 03/09/2021 12:58 PM   STROKE MD NOTE :  I have personally obtained history,examined this patient, reviewed notes, independently viewed imaging studies, participated in  medical decision making and plan of care.ROS completed by me personally and pertinent positives fully documented  I have made any additions or clarifications directly to the above note. Agree with note above.  Patient presented with transient left-sided weakness which appears to have resolved.  He has baseline dementia and no recollection of his deficits.  Recommend aspirin Plavix for 3 weeks followed by aspirin alone and aggressive risk factor modification.  Continue memantine and galantamine for his dementia.  Follow-up with outpatient neurologist Dr. Laurena Slimmer after discharge.  Greater than 50% time during this 35-minute visit was spent on counseling and coordination of care about his TIA dementia and discussion with patient and care team and answering questions.  Discussed with Dr. Sarajane Jews.  Stroke team will sign off.  Kindly call for questions.  Antony Contras, MD Medical Director [facility] Pager: [phone] 03/09/2021 4:11 PM   To contact Stroke Continuity provider, please refer to [email or web address] After hours, contact General Neurology

## 2021-03-09 NOTE — Assessment & Plan Note (Signed)
--   Diet controlled, appears stable.

## 2021-03-09 NOTE — Progress Notes (Signed)
Carotid duplex bilateral study completed.   Please see CV Proc for preliminary results.   Parish Dubose, RDMS, RVT  

## 2021-03-09 NOTE — Hospital Course (Addendum)
81 year old man PMH including diabetes, dementia presented with left-sided weakness, possibly dizziness, fall at home.  Treated as code stroke on arrival, seen by neurology, head CT negative, MRI brain and MRA, echocardiogram negative for significant abnormalities.  Carotid ultrasound pending.  Suspect TIA, await further neurology recommendations, likely home later today after therapy evaluations.

## 2021-03-09 NOTE — Progress Notes (Addendum)
°  Progress Note Patient: Matthew Knight DOB: 12/25/1940 DOA: 03/08/2021     0 DOS: the patient was seen and examined on 03/09/2021   Brief hospital course: 81 year old man PMH including diabetes, dementia presented with left-sided weakness, possibly dizziness, fall at home.  Treated as code stroke on arrival, seen by neurology, head CT negative, MRI brain and MRA, echocardiogram negative for significant abnormalities.  Carotid ultrasound pending.  Suspect TIA, await further neurology recommendations, likely home later today after therapy evaluations.  Assessment and Plan * TIA (transient ischemic attack) -- With associated left-sided weakness, now resolved.  No apparent deficits.  Work-up including CT head, MRI brain, MRA head, echocardiogram unremarkable.  Carotid ultrasound pending. -- Continue aspirin, Plavix added by neurology, continue statin.  Follow-up therapy evaluations.  Diabetes mellitus without complication (HCC) -- Diet controlled, appears stable.  Closed fracture distal radius and ulna, left, sequela -- Follow-up as outpatient, imaging revealed no acute changes  Dementia ([place])- (present on admission) -- Appears stable.  Given second episode of possible dizziness and fall, will consider discontinuation of this medication as it can cause syncope, bradycardia and hypotension.  Benign essential HTN- (present on admission) -- Permissive hypertension.  Can resume Imdur, lisinopril on discharge.  Likely home later today if cleared by neurology after therapy evaluations    Subjective:  Feels fine, no complaints, no weakness  Objective Vital signs were reviewed and unremarkable. Physical Exam Constitutional:      General: He is not in acute distress.    Appearance: He is not ill-appearing.     Comments: Eating lunch unassisted  Cardiovascular:     Rate and Rhythm: Normal rate and regular rhythm.     Heart sounds: No murmur heard. Pulmonary:     Effort:  Pulmonary effort is normal. No respiratory distress.     Breath sounds: No wheezing, rhonchi or rales.  Musculoskeletal:     Right lower leg: No edema.     Left lower leg: No edema.  Neurological:     General: No focal deficit present.     Mental Status: He is alert.  Psychiatric:        Mood and Affect: Mood normal.        Behavior: Behavior normal.    Data Reviewed: Imaging of the head and brain unremarkable as described above.   LDL 96  Family Communication: updated son by telephone. He reports the patient is normally ambulatory and able to go up stairs.  SNF to be pursued.  Disposition: Status is: Observation  The patient remains OBS appropriate and will d/c before 2 midnights.     Time spent: 35 minutes  Author: Murray Hodgkins MD 03/09/2021 1:00 PM  For on call review [email or web address].

## 2021-03-09 NOTE — Evaluation (Addendum)
Occupational Therapy Evaluation Patient Details Name: Matthew Knight MRN: [id number] DOB: 1940/12/10 Today's Date: 03/09/2021   History of Present Illness Pt is a 81 year old man admitted on 03/08/21 with new onset L side weakness and fall. Pt fell last week and fractured his L wrist which is now splinted. Brain MRI negative for acute changes. PMH: DM, dementia, hepatitis, hearing loss, small vessel disease.   Clinical Impression   Pt is typically independent in mobility and ADL. He lives with his wife and son. Pt presents with impaired cognition with decreased awareness of deficits and safety. He was unable to ambulate with difficulty advancing his L LE. Pt requires 2 person assist for transfers and min to total assist for ADL. Recommending inpatient rehab for further rehab prior to return home. Will follow acutely.     Recommendations for follow up therapy are one component of a multi-disciplinary discharge planning process, led by the attending physician.  Recommendations may be updated based on patient status, additional functional criteria and insurance authorization.   Follow Up Recommendations  Acute Inpatient Rehab    Assistance Recommended at Discharge Frequent or constant Supervision/Assistance  Functional Status Assessment  Patient has had a recent decline in their functional status and demonstrates the ability to make significant improvements in function in a reasonable and predictable amount of time.  Equipment Recommendations  BSC/3in1;Wheelchair (measurements OT);Wheelchair cushion (measurements OT)    Recommendations for Other Services       Precautions / Restrictions Precautions Precautions: Fall Restrictions Weight Bearing Restrictions: Yes LUE Weight Bearing: Weight bear through elbow only Other Position/Activity Restrictions: no specific orders for L UE weight bearing, assume through elbow only      Mobility Bed Mobility Overal bed mobility: Needs  Assistance Bed Mobility: Supine to Sit;Sit to Supine     Supine to sit: Mod assist Sit to supine: Mod assist   General bed mobility comments: assist to raise trunk and for LEs into bed    Transfers Overall transfer level: Needs assistance Equipment used: 2 person hand held assist Transfers: Sit to/from Stand Sit to Stand: +2 physical assistance;Min assist           General transfer comment: assist to rise and steady, pt unable to advance L LE upon initially standing, improved during session      Balance Overall balance assessment: Needs assistance   Sitting balance-Leahy Scale: Fair     Standing balance support: Bilateral upper extremity supported Standing balance-Leahy Scale: Poor                             ADL either performed or assessed with clinical judgement   ADL Overall ADL's : Needs assistance/impaired Eating/Feeding: Minimal assistance;Bed level   Grooming: Minimal assistance;Sitting;Wash/dry hands   Upper Body Bathing: Moderate assistance;Sitting   Lower Body Bathing: Sit to/from stand;+2 for physical assistance;Total assistance   Upper Body Dressing : Minimal assistance;Sitting   Lower Body Dressing: Total assistance;+2 for physical assistance;Sit to/from stand   Toilet Transfer: Minimal assistance;+2 for physical assistance;Stand-pivot;Grab Environmental education officer and Hygiene: Total assistance;+2 for physical assistance;Sit to/from stand       Functional mobility during ADLs:  (unable to ambulate)       Vision Baseline Vision/History: 1 Wears glasses Ability to See in Adequate Light: 0 Adequate Patient Visual Report: No change from baseline       Perception     Praxis  Pertinent Vitals/Pain Pain Assessment: Faces Faces Pain Scale: Hurts little more Pain Location: L wrist Pain Descriptors / Indicators: Discomfort;Tender Pain Intervention(s): Monitored during session;Repositioned      Hand Dominance Right   Extremity/Trunk Assessment Upper Extremity Assessment Upper Extremity Assessment: LUE deficits/detail LUE Deficits / Details: splinted from forearm to MPs LUE: Unable to fully assess due to immobilization LUE Coordination: decreased fine motor   Lower Extremity Assessment Lower Extremity Assessment: Defer to PT evaluation   Cervical / Trunk Assessment Cervical / Trunk Assessment: Other exceptions Cervical / Trunk Exceptions: obesity   Communication Communication Communication: HOH   Cognition Arousal/Alertness: Awake/alert Behavior During Therapy: WFL for tasks assessed/performed Overall Cognitive Status: No family/caregiver present to determine baseline cognitive functioning                                 General Comments: pt with history of dementia per chart, no family available at time of visit, pt able to follow commands with increased time, decreased safety awareness, oriented to self, place and situation, able to give PLOF and home set up     General Comments       Exercises     Shoulder Instructions      Home Living Family/patient expects to be discharged to:: Private residence Living Arrangements: Children;Spouse/significant other Available Help at Discharge: Family;Available PRN/intermittently Type of Home: House Home Access: Stairs to enter [employer] of Steps: 6 Entrance Stairs-Rails: Right;Left Home Layout: Multi-level;Bed/bath upstairs Alternate Level Stairs-Number of Steps: 7 Alternate Level Stairs-Rails: Right Bathroom Shower/Tub: Occupational psychologist: Standard     Home Equipment: Grab bars - tub/shower;Grab bars - toilet          Prior Functioning/Environment Prior Level of Function : Independent/Modified Independent                        OT Problem List: Decreased strength;Decreased activity tolerance;Impaired balance (sitting and/or standing);Decreased  knowledge of use of DME or AE;Impaired UE functional use;Obesity;Decreased coordination;Decreased cognition;Decreased safety awareness      OT Treatment/Interventions: Self-care/ADL training;DME and/or AE instruction;Therapeutic activities;Patient/family education;Balance training    OT Goals(Current goals can be found in the care plan section) Acute Rehab OT Goals OT Goal Formulation: With patient Time For Goal Achievement: 03/23/21 Potential to Achieve Goals: Good ADL Goals Pt Will Perform Grooming: with min assist;standing Pt Will Perform Lower Body Bathing: with min assist;sit to/from stand Pt Will Transfer to Toilet: with min assist;ambulating;bedside commode Pt Will Perform Toileting - Clothing Manipulation and hygiene: with min assist;sit to/from stand Additional ADL Goal #1: Pt will perform bed mobility modified independently in preparation for ADL.  OT Frequency: Min 2X/week   Barriers to D/C:            Co-evaluation PT/OT/SLP Co-Evaluation/Treatment: Yes Reason for Co-Treatment: For patient/therapist safety   OT goals addressed during session: ADL's and self-care      AM-PAC OT "6 Clicks" Daily Activity     Outcome Measure Help from another person eating meals?: A Little Help from another person taking care of personal grooming?: A Little Help from another person toileting, which includes using toliet, bedpan, or urinal?: Total Help from another person bathing (including washing, rinsing, drying)?: A Lot Help from another person to put on and taking off regular upper body clothing?: A Little Help from another person to put on and taking off regular lower body clothing?: Total  6 Click Score: 13   End of Session Equipment Utilized During Treatment: Gait belt;Other (comment) (wheelchair) Nurse Communication: Mobility status  Activity Tolerance: Patient tolerated treatment well Patient left: in bed;with call bell/phone within reach  OT Visit Diagnosis: Unsteadiness  on feet (R26.81);Muscle weakness (generalized) (M62.81);Other symptoms and signs involving cognitive function                Time: 7517-0017 OT Time Calculation (min): 32 min Charges:  OT General Charges $OT Visit: 1 Visit OT Evaluation $OT Eval Moderate Complexity: 1 Mod  Nestor Lewandowsky, OTR/L Acute Rehabilitation Services Pager: [phone] Office: [phone]   Malka So 03/09/2021, 1:52 PM

## 2021-03-09 NOTE — ED Notes (Signed)
Family has been updated on pt care plan

## 2021-03-09 NOTE — Assessment & Plan Note (Signed)
--   Follow-up as outpatient, imaging revealed no acute changes

## 2021-03-09 NOTE — Assessment & Plan Note (Addendum)
--   With associated left-sided weakness, now resolved.  No apparent deficits.  Work-up including CT head, MRI brain, MRA head, echocardiogram unremarkable.  Carotid ultrasound pending. -- Continue aspirin, Plavix added by neurology, continue statin.  Follow-up therapy evaluations.

## 2021-03-09 NOTE — Progress Notes (Signed)
PT Cancellation Note  Patient Details Name: Matthew Knight MRN: [id number] DOB: [birth year]   Cancelled Treatment:    Reason Eval/Treat Not Completed: Patient at procedure or test/unavailable; patient in vascular lab.  Will attempt again later as schedule permits.    Reginia Naas 03/09/2021, 10:07 AM Magda Kiel, PT Acute Rehabilitation Services YWXIP:[phone] Office:[phone] 03/09/2021

## 2021-03-09 NOTE — Progress Notes (Signed)
Inpatient Rehab Admissions Coordinator:   Consult received and chart reviewed.  Note imaging negative for any acute process and pt back to baseline per MD notes.  He is not a candidate for CIR at this time.  Will sign off.    Shann Medal, PT, DPT Admissions Coordinator [phone] 03/09/21  2:20 PM

## 2021-03-09 NOTE — Assessment & Plan Note (Signed)
--   Permissive hypertension.  Can resume Imdur, lisinopril on discharge.

## 2021-03-09 NOTE — ED Notes (Signed)
Got patient pulled up in the bed patient is resting with call bell in reach and family at bedside

## 2021-03-09 NOTE — ED Notes (Signed)
Pt transported to vascular.  °

## 2021-03-10 DIAGNOSIS — F0390 Unspecified dementia without behavioral disturbance: Secondary | ICD-10-CM | POA: Diagnosis not present

## 2021-03-10 DIAGNOSIS — G459 Transient cerebral ischemic attack, unspecified: Secondary | ICD-10-CM | POA: Diagnosis not present

## 2021-03-10 DIAGNOSIS — I10 Essential (primary) hypertension: Secondary | ICD-10-CM | POA: Diagnosis not present

## 2021-03-10 DIAGNOSIS — R531 Weakness: Secondary | ICD-10-CM | POA: Diagnosis not present

## 2021-03-10 DIAGNOSIS — I951 Orthostatic hypotension: Secondary | ICD-10-CM

## 2021-03-10 LAB — URINALYSIS, COMPLETE (UACMP) WITH MICROSCOPIC
Bilirubin Urine: NEGATIVE
Glucose, UA: NEGATIVE mg/dL
Hgb urine dipstick: NEGATIVE
Ketones, ur: NEGATIVE mg/dL
Leukocytes,Ua: NEGATIVE
Nitrite: NEGATIVE
Protein, ur: NEGATIVE mg/dL
Specific Gravity, Urine: 1.025 (ref 1.005–1.030)
pH: 6 (ref 5.0–8.0)

## 2021-03-10 LAB — GLUCOSE, CAPILLARY
Glucose-Capillary: 113 mg/dL — ABNORMAL HIGH (ref 70–99)
Glucose-Capillary: 118 mg/dL — ABNORMAL HIGH (ref 70–99)
Glucose-Capillary: 104 mg/dL — ABNORMAL HIGH (ref 70–99)
Glucose-Capillary: 106 mg/dL — ABNORMAL HIGH (ref 70–99)

## 2021-03-10 NOTE — Progress Notes (Addendum)
°   03/10/21 1302 03/10/21 1306 03/10/21 1308  Vitals  Temp 97.9 F (36.6 C)  --   --   Temp Source Oral  --   --   BP (!) 151/64  --   --   MAP (mmHg) 90 93 113  BP Location Left Arm  --   --   BP Method Automatic  --   --   Patient Position (if appropriate) Lying  --   --   Pulse Rate 64  --   --   Pulse Rate Source Dinamap  --   --   Resp 20  --   --   MEWS COLOR  MEWS Score Color Green  --   --   Orthostatic Lying   BP- Lying 151/64  --   --   Pulse- Lying 64  --   --   Orthostatic Sitting  BP- Sitting  --  156/74  --   Pulse- Sitting  --  67  --   Orthostatic Standing at 0 minutes  BP- Standing at 0 minutes  --   --  (!) 167/92  Pulse- Standing at 0 minutes  --   --  79  Orthostatic Standing at 3 minutes  BP- Standing at 3 minutes  --   --   --   Pulse- Standing at 3 minutes  --   --   --     03/10/21 1312  Vitals  Temp  --   Temp Source  --   BP  --   MAP (mmHg) 104  BP Location  --   BP Method  --   Patient Position (if appropriate)  --   Pulse Rate  --   Pulse Rate Source  --   Resp  --   MEWS COLOR  MEWS Score Color  --   Orthostatic Lying   BP- Lying  --   Pulse- Lying  --   Orthostatic Sitting  BP- Sitting  --   Pulse- Sitting  --   Orthostatic Standing at 0 minutes  BP- Standing at 0 minutes  --   Pulse- Standing at 0 minutes  --   Orthostatic Standing at 3 minutes  BP- Standing at 3 minutes (!) 132/95  Pulse- Standing at 3 minutes 79   Orthostatic VS as above. TED hose also applied.

## 2021-03-10 NOTE — Assessment & Plan Note (Signed)
--   Appears stable.  Given second episode of possible dizziness and fall, as well as orthostasis, will discontinue galantamine as it can cause syncope, bradycardia and hypotension. Recommend outpatient follow-up.

## 2021-03-10 NOTE — Care Management Obs Status (Signed)
[place] NOTIFICATION   Patient Details  Name: CLEARENCE VITUG MRN: [id number] Date of Birth: 13-Jun-1940   Medicare Observation Status Notification Given:  Yes    Pollie Friar, RN 03/10/2021, 10:39 AM

## 2021-03-10 NOTE — TOC Initial Note (Signed)
Transition of Care [facility]) - Initial/Assessment Note    Patient Details  Name: Matthew Knight MRN: [id number] Date of Birth: 1940-09-16  [facility] CM/SW Contact:    Pollie Friar, RN Phone Number: 03/10/2021, 3:36 PM  Clinical Narrative:                 CM met with the patient, his daughter and son. Family asking for SNF rehab and potential ALF. CM informed the family that we dont admit to ALF but CM asked if a referral could be made to [facility] so they could help the family with the process. Daughter in agreement.  Pt also is retired from [employer]. CM has called and left a voicemail for his SW through the [place]: Jasmin [phone] ext [phone] to see what they can assist with at discharge.  Pt is currently here as observation so doesn't qualify for SNF under his medicare but Medicare is doing a waiver until Jan 11th. CM has verified that if the patient admits to SNF before the 11th he will not get charged after the 11th. Daughter in agreement with having him faxed out for SNF in the [facility] area.  CM will provide bed offers to the patients daughter tomorrow and hope to transition him to SNF tomorrow. Covid test on 1/2 so shouldn't need another test.  TOC following.  Expected Discharge Plan: Skilled Nursing Facility Barriers to Discharge: Continued Medical Work up   Patient Goals and CMS Choice   CMS Medicare.gov Compare Post Acute Care list provided to:: Patient Represenative (must comment) Choice offered to / list presented to : Adult Children  Expected Discharge Plan and Services Expected Discharge Plan: [place] In-house Referral: Clinical Social Work Discharge Planning Services: CM Consult Post Acute Care Choice: [place] Living arrangements for the past 2 months: [place]                                      Prior Living Arrangements/Services Living arrangements for the past 2  months: Single Family Home Lives with:: Self Patient language and need for interpreter reviewed:: Yes Do you feel safe going back to the place where you live?: Yes      Need for Family Participation in Patient Care: Yes (Comment) Care giver support system in place?: No (comment)   Criminal Activity/Legal Involvement Pertinent to Current Situation/Hospitalization: No - Comment as needed  Activities of Daily Living Home Assistive Devices/Equipment: None ADL Screening (condition at time of admission) Patient's cognitive ability adequate to safely complete daily activities?: Yes Is the patient deaf or have difficulty hearing?: Yes Does the patient have difficulty seeing, even when wearing glasses/contacts?: Yes Does the patient have difficulty concentrating, remembering, or making decisions?: Yes Patient able to express need for assistance with ADLs?: Yes Does the patient have difficulty dressing or bathing?: Yes Independently performs ADLs?: No Communication: Independent Dressing (OT): Needs assistance Is this a change from baseline?: Pre-admission baseline Grooming: Independent Feeding: Independent Bathing: Independent Toileting: Independent In/Out Bed: Needs assistance Is this a change from baseline?: Pre-admission baseline Walks in Home: Independent Does the patient have difficulty walking or climbing stairs?: Yes Weakness of Legs: Both Weakness of Arms/Hands: Left  Permission Sought/Granted                  Emotional Assessment Appearance:: Appears stated age Attitude/Demeanor/Rapport: Engaged  Affect (typically observed): Accepting Orientation: : Oriented to Self, Oriented to Place, Oriented to Situation   Psych Involvement: No (comment)  Admission diagnosis:  Acute left-sided weakness [R53.1] Left-sided weakness [R53.1] Patient Active Problem List   Diagnosis Date Noted   Orthostatic hypotension 03/10/2021   Acute left-sided weakness 03/09/2021   Closed  fracture distal radius and ulna, left, sequela 03/09/2021   TIA (transient ischemic attack) 03/09/2021   Diabetes mellitus without complication ([place])    Abnormal ECG 04/28/2020   Snoring 04/28/2020   Anxiety 11/22/2018   Dementia ([place]) 11/22/2018   Depression 11/22/2018   Exposure to Agent Orange 11/22/2018   Small vessel disease, 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   Dizziness 10/24/2018   Benign essential HTN 11/11/2017   Bradycardia 11/11/2017   Syncope 11/10/2017   Onychomycosis 04/19/2017   Memory loss 01/22/2017   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   Iliac artery aneurysm, 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   Hypertension, benign 03/28/2015   Screening for colon cancer 12/03/2013   Small vessel disease ([place]) 08/17/2013   Sensorineural hearing loss, 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 [address] Phone: [phone] Fax: [phone]     Social Determinants of Health (SDOH) Interventions    Readmission Risk Interventions No flowsheet data found.

## 2021-03-10 NOTE — Assessment & Plan Note (Signed)
--   Apparently asymptomatic, but may contribute to falls at home.  Recommend stopping Exelon.  Hydration is important.  Consider compression hose as outpatient.

## 2021-03-10 NOTE — Progress Notes (Addendum)
°  Progress Note   Patient: Matthew Knight HXT:[id number] DOB: October 23, 1940 DOA: 03/08/2021     0 DOS: the patient was seen and examined on 03/10/2021   Brief hospital course: 81 year old man PMH including diabetes, dementia presented with left-sided weakness, possibly dizziness, fall at home.  Treated as code stroke on arrival, seen by neurology, head CT negative, MRI brain and MRA, echocardiogram negative for significant abnormalities.  Carotid ultrasound pending.  Suspect TIA, await further neurology recommendations, likely home later today after therapy evaluations.  Assessment and Plan * TIA (transient ischemic attack) -- With associated left-sided weakness, markedly improved. Work-up including CT head, MRI brain, MRA head, echocardiogram unremarkable.  Carotid ultrasound unremarkable.  PT initially recommended CIR, patient not a candidate for insurance reasons.  Nurse reports he is walking much better today, will reengage PT for updated recommendations. -- per neurology: Aspirin 81 mg daily and clopidogrel 75 mg daily.x 3 weeks followed by aspirin alone; added statin --Follow-up with outpatient neurologist Dr. Laurena Slimmer after discharge.    Acute left-sided weakness -- May be resolved at this point, certainly improved, no organic cause found  Orthostatic hypotension -- Apparently asymptomatic, but may contribute to falls at home.  Recommend stopping Exelon.  Hydration is important.  Consider compression hose as outpatient.  Diabetes mellitus without complication (HCC) -- Diet controlled, appears stable.  Closed fracture distal radius and ulna, left, sequela -- Follow-up as outpatient, imaging revealed no acute changes  Dementia ([place])- (present on admission) -- Appears stable.  Given second episode of possible dizziness and fall, as well as orthostasis, will discontinue galantamine as it can cause syncope, bradycardia and hypotension. Recommend outpatient follow-up.  Benign  essential HTN- (present on admission) -- Permissive hypertension.  Can resume Imdur, lisinopril on discharge.    Did very poorly with occupational therapy, poor insight, concern for discharge home without 24/7 care.  Discussed in detail with son, he is pursuing options in concert with case management.  Hold discharge today to pursue safe disposition, hopefully can be arranged with possible discharge tomorrow.  Urinary frequency, history unreliable, check urinalysis, stop Exelon patch.  Bladder scan.  Per son no previous history of BPH, urinary or prostate problems.  Subjective:  Feels fine no complaints Orthostatic yesterday RN reports pt walking well with assistance today  Objective Vital signs were reviewed and unremarkable. Physical Exam Constitutional:      General: He is not in acute distress.    Appearance: He is not ill-appearing or toxic-appearing.  Cardiovascular:     Rate and Rhythm: Normal rate and regular rhythm.     Heart sounds: No murmur heard. Pulmonary:     Effort: Pulmonary effort is normal. No respiratory distress.     Breath sounds: No wheezing, rhonchi or rales.  Musculoskeletal:     Right lower leg: No edema.     Left lower leg: No edema.     Comments: Moves LUE, LLE well with good strength  Neurological:     General: No focal deficit present.     Mental Status: He is alert.  Psychiatric:        Mood and Affect: Mood normal.        Behavior: Behavior normal.   Data Reviewed: CBG stable  Family Communication:   Disposition: Status is: Observation      Time spent: 25 minutes  Author: Murray Hodgkins MD 03/10/2021 10:36 AM  For on call review [email or web address].

## 2021-03-10 NOTE — Assessment & Plan Note (Addendum)
--   With associated left-sided weakness, markedly improved. Work-up including CT head, MRI brain, MRA head, echocardiogram unremarkable.  Carotid ultrasound unremarkable.  PT initially recommended CIR, patient not a candidate for insurance reasons.  Nurse reports he is walking much better today, will reengage PT for updated recommendations. -- per neurology: Aspirin 81 mg daily and clopidogrel 75 mg daily.x 3 weeks followed by aspirin alone; added statin --Follow-up with outpatient neurologist Dr. Laurena Slimmer after discharge.

## 2021-03-10 NOTE — Assessment & Plan Note (Signed)
--   May be resolved at this point, certainly improved, no organic cause found

## 2021-03-10 NOTE — Progress Notes (Signed)
°   03/10/21 1755  Clinical Encounter Type  Visited With Patient not available (Pt in bathroom)  Visit Type Initial  Referral From Nurse  Consult/Referral To Chaplain   Chaplain Jorene Guest responded to the consult request for an Advance Directive. Upon entering the unit, the attending nurse, [facility] provided an update to advise that the patient is in altered mental state. Chaplain Baker Janus advised her we are unable to do the Advance Directive if the patient is not mentally competent. The attending nurse, Ave Filter asked if the chaplain could explain to the patient's daughter on 03/11/21. This note was prepared by Jeanine Luz, M.Div..  For questions please contact by phone [phone].

## 2021-03-10 NOTE — NC FL2 (Signed)
[place] MEDICAID FL2 LEVEL OF CARE SCREENING TOOL     IDENTIFICATION  Patient Name: Matthew Knight Birthdate: 04-17-1940 Sex: male Admission Date (Current Location): 03/08/2021  [facility] and [place] Number:  Herbalist and Address:  [facility], [place] [address] [place], [place] [address]      Provider Number: [id number]  Attending Physician Name and Address:  Samuella Cota, MD  Relative Name and Phone Number:       Current Level of Care: Hospital Recommended Level of Care: [place] Prior Approval Number:    Date Approved/Denied:   PASRR Number: [id number]  Discharge Plan: SNF    Current Diagnoses: Patient Active Problem List   Diagnosis Date Noted   Orthostatic hypotension 03/10/2021   Acute left-sided weakness 03/09/2021   Closed fracture distal radius and ulna, left, sequela 03/09/2021   TIA (transient ischemic attack) 03/09/2021   Diabetes mellitus without complication ([place])    Abnormal ECG 04/28/2020   Snoring 04/28/2020   Anxiety 11/22/2018   Dementia ([place]) 11/22/2018   Depression 11/22/2018   Exposure to Agent Orange 11/22/2018   Small vessel disease, 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   Dizziness 10/24/2018   Benign essential HTN 11/11/2017   Bradycardia 11/11/2017   Syncope 11/10/2017   Onychomycosis 04/19/2017   Memory loss 01/22/2017   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   Iliac artery aneurysm, 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   Hypertension, benign 03/28/2015   Screening for colon cancer 12/03/2013   Small vessel disease ([place]) 08/17/2013   Sensorineural hearing loss, 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   Tobacco use disorder 07/17/2012   Vitamin D deficiency 01/14/2012   Sudden visual loss 08/17/2010   Elevated prostate specific antigen (PSA) 01/07/2010   Chest pain 02/10/2009   Skin sensation disturbance 02/10/2009   Hepatitis 01/23/2009    Orientation RESPIRATION BLADDER Height & Weight      Self, Place  Normal Incontinent, Continent (incontinent at times) Weight: 199 lb 15.3 oz (90.7 kg) Height:  6' (182.9 cm)  BEHAVIORAL SYMPTOMS/MOOD NEUROLOGICAL BOWEL NUTRITION STATUS      Continent Diet (regular)  AMBULATORY STATUS COMMUNICATION OF NEEDS Skin   Limited Assist Verbally Normal                       Personal Care Assistance Level of Assistance  Bathing, Feeding, Dressing Bathing Assistance: Limited assistance Feeding assistance: Independent Dressing Assistance: Limited assistance     Functional Limitations Info  Sight, Hearing Sight Info: Impaired Hearing Info: Impaired      SPECIAL CARE FACTORS FREQUENCY  PT (By licensed PT), OT (By licensed OT), Speech therapy     PT Frequency: 5x/wk OT Frequency: 5x/wk     Speech Therapy Frequency: 5x/wk      Contractures Contractures Info: Not present    Additional Factors Info  Code Status, Allergies, Insulin Sliding Scale Code Status Info: Full Allergies Info: Darunavir, Sulfa Antibiotics, No Known Allergies   Insulin Sliding Scale Info: see DC summary       Current Medications (03/10/2021):  This is the current hospital active medication list Current Facility-Administered Medications  Medication Dose Route Frequency Provider Last Rate Last Admin    stroke: mapping our early stages of recovery book   Does not apply Once Opyd, Ilene Qua, MD       acetaminophen (TYLENOL) tablet 650 mg  650 mg Oral Q4H PRN Opyd, Ilene Qua, MD       Or   acetaminophen (  TYLENOL) 160 MG/5ML solution 650 mg  650 mg Per Tube Q4H PRN Opyd, Ilene Qua, MD       Or   acetaminophen (TYLENOL) suppository 650 mg  650 mg Rectal Q4H PRN Opyd, Ilene Qua, MD       aspirin EC tablet 81 mg  81 mg Oral Daily Opyd, Ilene Qua, MD   81 mg at 03/10/21 0810   clopidogrel (PLAVIX) tablet 75 mg  75 mg Oral Daily Opyd, Ilene Qua, MD   75 mg at 03/10/21 0810   enoxaparin (LOVENOX) injection 40 mg  40 mg Subcutaneous Daily Opyd, Ilene Qua, MD   40  mg at 03/10/21 0810   insulin aspart (novoLOG) injection 0-5 Units  0-5 Units Subcutaneous QHS Opyd, Ilene Qua, MD       insulin aspart (novoLOG) injection 0-9 Units  0-9 Units Subcutaneous TID WC Opyd, Ilene Qua, MD       labetalol (NORMODYNE) injection 10 mg  10 mg Intravenous Q4H PRN Opyd, Ilene Qua, MD   10 mg at 03/10/21 0514   pravastatin (PRAVACHOL) tablet 20 mg  20 mg Oral Daily Opyd, Ilene Qua, MD   20 mg at 03/10/21 0810   senna-docusate (Senokot-S) tablet 1 tablet  1 tablet Oral QHS PRN Opyd, Ilene Qua, MD       sodium chloride flush (NS) 0.9 % injection 3 mL  3 mL Intravenous Once Opyd, Ilene Qua, MD         Discharge Medications: Please see discharge summary for a list of discharge medications.  Relevant Imaging Results:  Relevant Lab Results:   Additional Information SS#: [id number]  Geralynn Ochs, LCSW

## 2021-03-10 NOTE — Progress Notes (Signed)
Occupational Therapy Treatment Patient Details Name: Matthew Knight MRN: [id number] DOB: 1940-04-05 Today's Date: 03/10/2021   History of present illness Pt is a 81 year old man admitted on 03/08/21 with new onset L side weakness and fall. Pt fell last week and fractured his L wrist which is now splinted. Brain MRI negative for acute changes. PMH: DM, dementia, hepatitis, hearing loss, small vessel disease.   OT comments  Pt seen again today to assist in d/c planning. Upon arrival, NT took orthostatic BPs which were positive, however pt denied any signs/symptoms. Pt requires mod-max cues for safety, problem solving and sequencing, likely due to baseline cognitive impairment. Ultimately he required up to min A for functional mobility and ADLs, with increased assist required as pt fatigued. Increased time spent reviewing home set up and support with family - pt's wife (& PCG) passed away just before christmas, and pt's children are unable to provide 24/7 assistance. Per reports, pt gets up several times per night, had had multiple falls and has left appliances on in the kitchen when unsupervised. Due to assist levels, drop in BP, history of falls and impaired cognition, pt it unable to d/c home without 24/7 direct physical assistance. Ultimately family wants to transition him into an AFL. If pt can obtain 24/7 assist at home, he could d/c to home until family coordinates the AFL. If not, recommend SNF level therapies. Ot to continue to follow acutely.    Recommendations for follow up therapy are one component of a multi-disciplinary discharge planning process, led by the attending physician.  Recommendations may be updated based on patient status, additional functional criteria and insurance authorization.    Follow Up Recommendations  Skilled nursing-short term rehab (<3 hours/day) (ALF. Pt could d/c home if he had 24/7 direct physical assist, family unable to provide at this date.)    Assistance  Recommended at Discharge Frequent or constant Supervision/Assistance  Patient can return home with the following  A little help with walking and/or transfers;A little help with bathing/dressing/bathroom;Assistance with cooking/housework;Assistance with feeding;Direct supervision/assist for medications management;Direct supervision/assist for financial management;Help with stairs or ramp for entrance   Equipment Recommendations  BSC/3in1       Precautions / Restrictions Precautions Precautions: Fall Restrictions Weight Bearing Restrictions: Yes LUE Weight Bearing: Weight bear through elbow only Other Position/Activity Restrictions: no specific orders for L UE weight bearing, assume through elbow only due to L wrist injury/splint       Mobility Bed Mobility Overal bed mobility: Needs Assistance Bed Mobility: Supine to Sit     Supine to sit: Min guard     General bed mobility comments: ptin chair upon arrival, returned to chair    Transfers Overall transfer level: Needs assistance Equipment used: Straight cane;Left platform walker Transfers: Sit to/from Stand Sit to Stand: Min assist           General transfer comment: min A for verbal cues, no phyisical assis for transfer. min A for balance after standing >3 minutes     Balance Overall balance assessment: Needs assistance Sitting-balance support: Feet supported;No upper extremity supported Sitting balance-Leahy Scale: Good     Standing balance support: During functional activity;No upper extremity supported Standing balance-Leahy Scale: Fair Standing balance comment: fair-poor for grooming at the sink once fatigued                           ADL either performed or assessed with clinical judgement  ADL Overall ADL's : Needs assistance/impaired     Grooming: Minimal assistance;Standing Grooming Details (indicate cue type and reason): min A to locate items on busy countertop, squeeze tooth paste onto  brush, and for balance in standing after fatigue                 Toilet Transfer: Minimal assistance Toilet Transfer Details (indicate cue type and reason): platform walker vs. SPC. regarless, pt requries min A for AD management and balance once he is fatigued         Functional mobility during ADLs: Minimal assistance General ADL Comments: pt limited by cognition, LUE fx, balance and poor activity tolerance    Extremity/Trunk Assessment Upper Extremity Assessment LUE Deficits / Details: splinted from forearm to MPs LUE Coordination: decreased fine motor   Lower Extremity Assessment Lower Extremity Assessment: Defer to PT evaluation        Vision   Vision Assessment?: Vision impaired- to be further tested in functional context Additional Comments: wears glasses   Perception Perception Perception: Within Functional Limits (for grooming at the sink)   Praxis Praxis Praxis: Not tested    Cognition Arousal/Alertness: Awake/alert Behavior During Therapy: WFL for tasks assessed/performed Overall Cognitive Status: History of cognitive impairments - at baseline                                 General Comments: pt with history of dementia - pt repeating the same statement several times throughout the session. Overall cog is [facility] for functional tasks however pt requires mod-max cues for AD management, safety, and for attention Functional Status Assessment: Patient has not had a recent decline in their functional status        Exercises     Shoulder Instructions       General Comments Pt orthostatic upon arrival per Lattie Haw, NT    Pertinent Vitals/ Pain       Pain Assessment: Faces Faces Pain Scale: Hurts little more Pain Location: L wrist Pain Descriptors / Indicators: Discomfort;Tender Pain Intervention(s): Limited activity within patient's tolerance;Monitored during session  Home Living       Type of Home: House                               Lives With: Son    Prior Functioning/Environment              Frequency  Min 2X/week        Progress Toward Goals  OT Goals(current goals can now be found in the care plan section)  Progress towards OT goals: Progressing toward goals  Acute Rehab OT Goals OT Goal Formulation: With patient Time For Goal Achievement: 03/23/21 Potential to Achieve Goals: Good ADL Goals Pt Will Perform Grooming: with min assist;standing Pt Will Perform Lower Body Bathing: with min assist;sit to/from stand Pt Will Transfer to Toilet: with min assist;ambulating;bedside commode Pt Will Perform Toileting - Clothing Manipulation and hygiene: with min assist;sit to/from stand Additional ADL Goal #1: Pt will perform bed mobility modified independently in preparation for ADL.  Plan Discharge plan needs to be updated       AM-PAC OT "6 Clicks" Daily Activity     Outcome Measure   Help from another person eating meals?: A Little Help from another person taking care of personal grooming?: A Little Help from another person toileting, which includes using  toliet, bedpan, or urinal?: A Little Help from another person bathing (including washing, rinsing, drying)?: A Little Help from another person to put on and taking off regular upper body clothing?: A Little Help from another person to put on and taking off regular lower body clothing?: A Little 6 Click Score: 18    End of Session Equipment Utilized During Treatment: Gait belt;Rolling walker (2 wheels)  OT Visit Diagnosis: Unsteadiness on feet (R26.81);Muscle weakness (generalized) (M62.81);Other symptoms and signs involving cognitive function   Activity Tolerance Patient tolerated treatment well   Patient Left in chair;with chair alarm set;with family/visitor present   Nurse Communication Mobility status        Time: 8871-9597 OT Time Calculation (min): 30 min  Charges: OT General Charges $OT Visit: 1 Visit OT  Treatments $Self Care/Home Management : 23-37 mins   Konica Stankowski A Shyvonne Chastang 03/10/2021, 2:40 PM

## 2021-03-10 NOTE — Progress Notes (Addendum)
Physical Therapy Treatment Patient Details Name: Matthew Knight MRN: [id number] DOB: [birth year] Today's Date: 03/10/2021   History of Present Illness Pt is a 81 year old man admitted on 03/08/21 with new onset L side weakness and fall. Pt fell last week and fractured his L wrist which is now splinted. Brain MRI negative for acute changes. PMH: DM, dementia, hepatitis, hearing loss, small vessel disease.    PT Comments    Pt received in supine, pleasantly agreeable to therapy session and A&O X3 with good participation and tolerance for transfer, gait and stair training this date. Pt making excellent progress with mobility, achieved multiple goals and discussed with supervising PT Cyndi W regarding pt need for goal update and disposition update. Pt agreeable to DC home with HHPT, discussed with care team/MD and case manager regarding disposition and DME update as detailed below. As long as pt has consistent supervision for safety with gait/stairs at home he should be safe to DC home with family support, he did not need physical hands-on assist for mobility tasks today, mostly min guard progressing to Supervision. Pt continues to benefit from PT services to progress toward functional mobility goals.   Recommendations for follow up therapy are one component of a multi-disciplinary discharge planning process, led by the attending physician.  Recommendations may be updated based on patient status, additional functional criteria and insurance authorization.  Follow Up Recommendations  Home health PT     Assistance Recommended at Discharge Frequent or constant Supervision/Assistance  Patient can return home with the following A little help with walking and/or transfers;A little help with bathing/dressing/bathroom;Assistance with cooking/housework;Direct supervision/assist for medications management;Direct supervision/assist for financial management;Assist for transportation   Equipment  Recommendations  Cane    Recommendations for Other Services       Precautions / Restrictions Precautions Precautions: Fall Restrictions Weight Bearing Restrictions: Yes LUE Weight Bearing: Weight bear through elbow only Other Position/Activity Restrictions: no specific orders for L UE weight bearing, assume through elbow only due to L wrist injury/splint     Mobility  Bed Mobility Overal bed mobility: Needs Assistance Bed Mobility: Supine to Sit     Supine to sit: Min guard     General bed mobility comments: pt using bed features to perform, min guard for safety and assist with bed linens    Transfers Overall transfer level: Needs assistance Equipment used: None;Straight cane Transfers: Sit to/from Stand Sit to Stand: Min guard;Supervision           General transfer comment: no dizziness reported with transfers, pt able to stand from EOB, recliner and low toilet heights with no AD and support of cane, pt steadier when using cane for support    Ambulation/Gait Ambulation/Gait assistance: Supervision;Min guard Gait Distance (Feet): 140 Feet (13f to toilet, then 1435f Assistive device: Straight cane Gait Pattern/deviations: Step-through pattern;Decreased stride length;Wide base of support Gait velocity: grossly <0.4 m/s     General Gait Details: pt without overt LOB, fairly steady using SPC, slow pace and pt tangential, needing cues for attention to task and wayfinding, pt able to see his room # when cued to scan environment for it.   Stairs Stairs: Yes Stairs assistance: Supervision Stair Management: One rail Right;Step to pattern;Forwards Number of Stairs: 15 General stair comments: steps in PT gym; step-to pattern per pt preference, no LOB, cues for attention to task intermittently   Wheelchair Mobility    Modified Rankin (Stroke Patients Only)       Balance Overall balance  assessment: Needs assistance Sitting-balance support: Feet supported;No  upper extremity supported Sitting balance-Leahy Scale: Good     Standing balance support: Single extremity supported Standing balance-Leahy Scale: Fair Standing balance comment: does well using cane, no external support needed                            Cognition Arousal/Alertness: Awake/alert Behavior During Therapy: WFL for tasks assessed/performed Overall Cognitive Status: No family/caregiver present to determine baseline cognitive functioning                                 General Comments: pt with history of dementia per chart, no family available at time of visit, pt able to follow commands well, pt very HoH but oriented to self, location and situation. Pt following simple commands well when he can hear them.        Exercises      General Comments General comments (skin integrity, edema, etc.): VSS on RA, HR WFL per tele      Pertinent Vitals/Pain Pain Assessment: No/denies pain Pain Intervention(s): Monitored during session;Repositioned     PT Goals (current goals can now be found in the care plan section) Acute Rehab PT Goals Patient Stated Goal: to go home and see my dog Shadow PT Goal Formulation: With patient Time For Goal Achievement: 03/23/21 Progress towards PT goals: Progressing toward goals;Goals met and updated - see care plan    Frequency    Min 3X/week      PT Plan Discharge plan needs to be updated;Equipment recommendations need to be updated;Frequency needs to be updated       AM-PAC PT "6 Clicks" Mobility   Outcome Measure  Help needed turning from your back to your side while in a flat bed without using bedrails?: None Help needed moving from lying on your back to sitting on the side of a flat bed without using bedrails?: A Little Help needed moving to and from a bed to a chair (including a wheelchair)?: A Little Help needed standing up from a chair using your arms (e.g., wheelchair or bedside chair)?: A  Little Help needed to walk in hospital room?: A Little Help needed climbing 3-5 steps with a railing? : A Little 6 Click Score: 19    End of Session Equipment Utilized During Treatment: Gait belt Activity Tolerance: Patient tolerated treatment well Patient left: in chair;with call bell/phone within reach;with chair alarm set;with nursing/sitter in room Nurse Communication: Mobility status PT Visit Diagnosis: Other abnormalities of gait and mobility (R26.89);Other symptoms and signs involving the nervous system (R29.898);History of falling (Z91.81)     Time: 1610-9604 PT Time Calculation (min) (ACUTE ONLY): 26 min  Charges:  $Gait Training: 8-22 mins $Therapeutic Activity: 8-22 mins                     Jennell Janosik P., PTA Acute Rehabilitation Services Pager: [phone] Office: [place] 03/10/2021, 12:43 PM

## 2021-03-10 NOTE — Plan of Care (Signed)
°  Problem: Education: Goal: Knowledge of General Education information will improve Description: Including pain rating scale, medication(s)/side effects and non-pharmacologic comfort measures 03/10/2021 1124 by Sanjuana Mae, RN Outcome: Not Progressing 03/10/2021 1123 by Sanjuana Mae, RN Outcome: Progressing

## 2021-03-10 NOTE — Assessment & Plan Note (Signed)
--   Follow-up as outpatient, imaging revealed no acute changes

## 2021-03-10 NOTE — Assessment & Plan Note (Signed)
--   Diet controlled, appears stable.

## 2021-03-10 NOTE — Assessment & Plan Note (Signed)
--   Permissive hypertension.  Can resume Imdur, lisinopril on discharge.

## 2021-03-10 NOTE — Evaluation (Signed)
Speech Language Pathology Evaluation Patient Details Name: Matthew Knight MRN: [id number] DOB: 1940/03/31 Today's Date: 03/10/2021 Time: 0102-7253 SLP Time Calculation (min) (ACUTE ONLY): 28 min  Problem List:  Patient Active Problem List   Diagnosis Date Noted   Orthostatic hypotension 03/10/2021   Acute left-sided weakness 03/09/2021   Closed fracture distal radius and ulna, left, sequela 03/09/2021   TIA (transient ischemic attack) 03/09/2021   Diabetes mellitus without complication ([place])    Abnormal ECG 04/28/2020   Snoring 04/28/2020   Anxiety 11/22/2018   Dementia ([place]) 11/22/2018   Depression 11/22/2018   Exposure to [facility] 11/22/2018   Small vessel disease, 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   Dizziness 10/24/2018   Benign essential HTN 11/11/2017   Bradycardia 11/11/2017   Syncope 11/10/2017   Onychomycosis 04/19/2017   Memory loss 01/22/2017   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   Iliac artery aneurysm, 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   Hypertension, benign 03/28/2015   Screening for colon cancer 12/03/2013   Small vessel disease ([place]) 08/17/2013   Sensorineural hearing loss, 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   Tobacco use disorder 07/17/2012   Vitamin D deficiency 01/14/2012   Sudden visual loss 08/17/2010   Elevated prostate specific antigen (PSA) 01/07/2010   Chest pain 02/10/2009   Skin sensation disturbance 02/10/2009   Hepatitis 01/23/2009   Past Medical History:  Past Medical History:  Diagnosis Date   Diabetes mellitus without complication ([place])    Hearing loss    Hepatitis    Small vessel disease ([place])    Past Surgical History:  Past Surgical History:  Procedure Laterality Date   APPENDECTOMY     HPI:  81 year old man PMH including diabetes, dementia presented with left-sided weakness, possibly dizziness, fall at home.  Treated as code stroke on arrival, seen by neurology, head CT negative, MRI brain and MRA, echocardiogram negative for  significant abnormalities.  Carotid ultrasound pending.  Suspect TIA.   Assessment / Plan / Recommendation Clinical Impression  Pt presents with mild cognitive deficits in setting of suspected TIA; pt has hx of mild dementia.  Pt was assessed using the COGNISTAT (see below for additional information). Pt was extremely pleasant and is an excellent conversation partner. He shared that he is missing his border collie, Shadow.  Pt oriented to person, place, and partially to time (correct month, date, dow; did not know year, and was slightly off on time of day).  Although pt lost points in area of comprehension, this is believed to be 2/2 significant hearing impairment rather than a true language deficits.  Pt benefited from repetition throughout assessment and stated that he frequently could not hear prompts.  Particularly with the command following task, hearing posed a problem as bedside table put additional distance between SLP and pt making communication more difficult. Pt exhibited severe-profound impairments on work recall task, and mild to moderate impairments in reasoning.  Pt reports that he lives with son but manages his own finances and medication.  He verbalized memory strategies he uses independently.  Imaging negative for new neurologic changes.  Suspect pt is at or near baseline level of function.  Pt has no acute ST needs.  Pt may benefit from OP/HH ST to address preexisting cognitive impairments related to dementia.  SLP will sign off at this time.  COGNISTAT: All subtests are within the average range, except where otherwise specified.  Orientation:  7/12, mild impairment Attention: 8/8 Comprehension: 6/6 Repetition: 12/12 Naming: 8/8 Construction: not assessed Memory: 2/12, severe impairment Calculations: 4/4 Similarities:  8/8, mild impairment Judgment: 2/6, moderate impairment     SLP Assessment  SLP Recommendation/Assessment: All further Speech Lanaguage Pathology  needs can be  addressed in the next venue of care SLP Visit Diagnosis: Cognitive communication deficit (R41.841)    Recommendations for follow up therapy are one component of a multi-disciplinary discharge planning process, led by the attending physician.  Recommendations may be updated based on patient status, additional functional criteria and insurance authorization.    Follow Up Recommendations  Follow physician's recommendations for discharge plan and follow up therapies (OP or HH ST if indicated to manage cognitive changes related to dementia process)    Assistance Recommended at Discharge  Intermittent Supervision/Assistance  Functional Status Assessment Patient has not had a recent decline in their functional status  Frequency and Duration   N/A        SLP Evaluation Cognition  Overall Cognitive Status: No family/caregiver present to determine baseline cognitive functioning Orientation Level: Oriented to person;Oriented to place Month: January Attention: Focused Focused Attention: Appears intact Memory: Impaired Memory Impairment: Decreased short term memory Decreased Short Term Memory: Verbal basic Awareness: Impaired Executive Function: Reasoning Reasoning: Impaired Reasoning Impairment: Verbal complex       Comprehension  Auditory Comprehension Overall Auditory Comprehension: Appears within functional limits for tasks assessed Commands: Within Functional Limits Interfering Components: Hearing Visual Recognition/Discrimination Discrimination: Not tested Reading Comprehension Reading Status: Not tested    Expression Expression Primary Mode of Expression: Verbal Verbal Expression Overall Verbal Expression: Appears within functional limits for tasks assessed Initiation: No impairment Repetition: No impairment Naming: No impairment Pragmatics: No impairment   Oral / Motor  Motor Speech Overall Motor Speech: Appears within functional limits for tasks assessed Respiration:  Within functional limits Phonation: Normal Resonance: Within functional limits Articulation: Within functional limitis Intelligibility: Intelligible Motor Planning: Witnin functional limits Motor Speech Errors: Not applicable            Celedonio Savage, MA, [place] Office: [phone]  03/10/2021, 1:34 PM

## 2021-03-11 DIAGNOSIS — R531 Weakness: Secondary | ICD-10-CM | POA: Diagnosis not present

## 2021-03-11 DIAGNOSIS — S52502S Unspecified fracture of the lower end of left radius, sequela: Secondary | ICD-10-CM | POA: Diagnosis not present

## 2021-03-11 DIAGNOSIS — G459 Transient cerebral ischemic attack, unspecified: Secondary | ICD-10-CM | POA: Diagnosis not present

## 2021-03-11 DIAGNOSIS — I10 Essential (primary) hypertension: Secondary | ICD-10-CM | POA: Diagnosis not present

## 2021-03-11 LAB — GLUCOSE, CAPILLARY
Glucose-Capillary: 111 mg/dL — ABNORMAL HIGH (ref 70–99)
Glucose-Capillary: 114 mg/dL — ABNORMAL HIGH (ref 70–99)
Glucose-Capillary: 101 mg/dL — ABNORMAL HIGH (ref 70–99)

## 2021-03-11 MED ORDER — LIDOCAINE HCL URETHRAL/MUCOSAL 2 % EX GEL
1.0000 "application " | Freq: Once | CUTANEOUS | Status: DC
  Filled 2021-03-11: qty 6

## 2021-03-11 MED ORDER — FOSFOMYCIN TROMETHAMINE 3 G PO PACK
3.0000 g | PACK | Freq: Once | ORAL | Status: AC
  Administered 2021-03-11: 3 g via ORAL
  Filled 2021-03-11: qty 3

## 2021-03-11 MED ORDER — LIDOCAINE HCL (CARDIAC) PF 100 MG/5ML IV SOSY
PREFILLED_SYRINGE | INTRAVENOUS | Status: AC
  Filled 2021-03-11: qty 5

## 2021-03-11 MED ORDER — CLOPIDOGREL BISULFATE 75 MG PO TABS: 75.0000 mg | ORAL_TABLET | Freq: Every day | ORAL | 2 refills | Status: DC

## 2021-03-11 MED ORDER — MELATONIN 3 MG PO TABS
3.0000 mg | ORAL_TABLET | Freq: Every evening | ORAL | Status: DC | PRN
  Administered 2021-03-11: 3 mg via ORAL

## 2021-03-11 MED ORDER — CLOPIDOGREL BISULFATE 75 MG PO TABS: 75.0000 mg | ORAL_TABLET | Freq: Every day | ORAL | 0 refills | Status: DC

## 2021-03-11 MED ORDER — MELATONIN 3 MG PO TABS
ORAL_TABLET | ORAL | Status: AC
  Filled 2021-03-11: qty 1

## 2021-03-11 MED ORDER — ACETAMINOPHEN 325 MG PO TABS: 650.0000 mg | ORAL_TABLET | ORAL | Status: AC | PRN

## 2021-03-11 NOTE — Progress Notes (Addendum)
[facility] night cross cover note:  I was notified by RN for pt's report of new onset urinary urgency, with limited urine output over the course of tonight's shift.  Bladder scan at this time revealed 341 cc.  To further evaluate acute urinary urgency, I have ordered UA.  Additionally, I placed nursing communication order for q6H bladder scans with prn straight cath for PVR > 400 cc's.   Update: Patient just had UA performed on 03/10/2021, which did not appear consistent with UTI.  Given close proximity to this recent UA result, I will d\c my above order for UA.     Babs Bertin, DO Hospitalist

## 2021-03-11 NOTE — Progress Notes (Signed)
°   03/11/21 1352  Clinical Encounter Type  Visited With Health care provider (Social Worker, Claiborne Billings)  Visit Type Follow-up  Referral From Chaplain  Consult/Referral To Chaplain   Chaplain Jorene Guest received a call from [place], [place] stating the patient's daughter is requesting for the notary to notarize the patient's HCPOA. I advised the because of the patient's medical records indicates mental altered state; we are unable to notarize the HCPOA. Claiborne Billings confirmed that the patient's daughter requested and not the patient. Claiborne Billings stated she would explain our position to the family. This note was prepared by Jeanine Luz, M.Div..  For questions please contact by phone [phone].

## 2021-03-11 NOTE — Progress Notes (Signed)
[facility] night cross cover note:  I was notified by RN of patient's request for prn sleep aid.  I subsequently placed order for prn melatonin.    Babs Bertin, DO Hospitalist

## 2021-03-11 NOTE — Plan of Care (Signed)

## 2021-03-11 NOTE — Progress Notes (Signed)
Patient left facility via stretcher accompanied by [facility] staff.  IV access was removed by the charge nurse and foley catheter remained intact.  Patient was oriented x4 and able to move all extremities.  He verbally denied complaint and no distress was noted at time of discharge.

## 2021-03-11 NOTE — Discharge Summary (Addendum)
Physician Discharge Summary  Matthew Knight QMV:[id number] DOB: [birth year] DOA: 03/08/2021  PCP: [name], MD  Admit date: 03/08/2021 Discharge date: 03/11/2021  Admitted From: Home Disposition: SNF - the above pt is to be admitted to SNF without the 3 night qualified hospital stay under the current medicare waiver.  Recommendations for Outpatient Follow-up:  Follow up with PCP in 1-2 weeks Follow up with orthopedics in 2 weeks for left distal radius/ulna fracture Follow up with neurology, Dr. Laurena Slimmer, in 6 weeks for ongoing dementia care and post-TIA follow up. Consider alternative agent for dementia care with possible side effects from exelon patch.  Recommend voiding trial in the next week.  Home Health: N/A Equipment/Devices: Foley catheter 03/11/2021 Discharge Condition: Stable CODE STATUS: Full Diet recommendation: Heart healthy  Brief/Interim Summary: Matthew Knight is a 81 year old man PMH including diabetes, dementia presented with left-sided weakness, possibly dizziness, fall at home.  Treated as code stroke on arrival, seen by neurology. Neuroimaging showed no acute abnormalities and symptoms improved spontaneously. He was started on DAPT and will require rehabilitation at SNF (see full details below).   Discharge Diagnoses:  Principal Problem:   TIA (transient ischemic attack) Active Problems:   Benign essential HTN   Dementia (HCC)   Acute left-sided weakness   Closed fracture distal radius and ulna, left, sequela   Diabetes mellitus without complication (HCC)   Orthostatic hypotension  TIA (transient ischemic attack): With associated left-sided weakness, markedly improved. Work-up including CT head, MRI brain, MRA head, echocardiogram unremarkable.  Carotid ultrasound unremarkable.  PT initially recommended CIR, patient not a candidate for insurance reasons.   -- per neurology: Aspirin 81 mg daily and clopidogrel 75 mg daily x 3 weeks followed by  aspirin alone; added statin --Follow-up with outpatient neurologist Dr. Laurena Slimmer after discharge.     Acute left-sided weakness: May be resolved at this point, certainly improved, no organic cause found.   Orthostatic hypotension: Apparently asymptomatic, but may contribute to falls at home.   - If recurs, recommend stopping Exelon (rivastigmine).   - Hydration is important.   - Consider compression hose as outpatient.   Diabetes mellitus without complication (HCC) -- Diet controlled, appears stable.   Closed fracture distal radius and ulna, left: imaging revealed no acute changes -- Follow-up as outpatient  Urinary retention: Appears to be a relatively acute issue. Has bacteria and WBCs on urine microscopy.  - Given a dose of fosfomycin.  - Foley inserted 03/11/2021, recommend voiding trial in the next few days.   Dementia ([place])- (present on admission): Appears stable.   - Given second episode of possible dizziness and fall, as well as orthostasis, we could consider discontinuing rivastigmine as it can cause syncope, bradycardia and hypotension. Recommend attention at outpatient follow-up.   Benign essential HTN- (present on admission) - Can resume Imdur, lisinopril on discharge.   Did very poorly with occupational therapy, poor insight, concern for discharge home without 24/7 care.  Discharge Instructions  Allergies as of 03/11/2021       Reactions   Darunavir Other (See Comments)   Other reaction(s): Eruption of skin   Sulfa Antibiotics    Other reaction(s): Other (See Comments) States made him crazy   No Known Allergies Rash        Medication List     STOP taking these medications    ketoconazole 2 % cream Commonly known as: NIZORAL       TAKE these medications    acetaminophen 325 MG  tablet Commonly known as: TYLENOL Take 2 tablets (650 mg total) by mouth every 4 (four) hours as needed for mild pain, moderate pain, fever or headache.   aspirin EC 81  MG tablet Take 81 mg by mouth daily.   clopidogrel 75 MG tablet Commonly known as: PLAVIX Take 1 tablet (75 mg total) by mouth daily for 21 days.   escitalopram 20 MG tablet Commonly known as: LEXAPRO Take 20 mg by mouth daily.   isosorbide mononitrate 30 MG 24 hr tablet Commonly known as: IMDUR Take 30 mg by mouth daily.   latanoprost 0.005 % ophthalmic solution Commonly known as: XALATAN Place 1 drop into both eyes at bedtime.   lisinopril 40 MG tablet Commonly known as: ZESTRIL Take 40 mg by mouth daily.   omeprazole 20 MG capsule Commonly known as: PRILOSEC Take 20 mg by mouth daily.   pravastatin 20 MG tablet Commonly known as: PRAVACHOL Take 20 mg by mouth daily.   Rhopressa 0.02 % Soln Generic drug: Netarsudil Dimesylate Place 1 drop into both eyes daily at 6 (six) AM.   rivastigmine 4.6 mg/24hr Commonly known as: EXELON Place 4.6 mg onto the skin daily.        Follow-up Information     [name], MD Follow up.   Specialty: Family Medicine Contact information: [address] [place] [address] [phone]         Jacques Navy, MD Follow up.   Specialties: Neurology, Psychiatry        Orene Desanctis, MD Follow up in 1 week(s).   Specialty: Orthopedic Surgery Contact information: [address] [place] [address] [phone]                Allergies  Allergen Reactions   Darunavir Other (See Comments)    Other reaction(s): Eruption of skin   Sulfa Antibiotics     Other reaction(s): Other (See Comments) States made him crazy   No Known Allergies Rash    Consultations: Neurology  Procedures/Studies: DG Wrist Complete Left  Result Date: 03/08/2021 CLINICAL DATA:  Fall. EXAM: LEFT WRIST - COMPLETE 3+ VIEW COMPARISON:  Left wrist x-ray 03/04/2021. FINDINGS: There is a new splint. Comminuted distal radius fracture with intra-articular extension is again seen. Alignment is near anatomic  and unchanged. Mild apex anterior angulation persists. Nondisplaced ulnar styloid fracture is also unchanged. There is no dislocation. Soft tissue swelling surrounding the wrist persists. IMPRESSION: 1. Comminuted distal radius fracture is in near anatomic alignment. Minimal apex anterior angulation persists. 2. Nondisplaced ulnar styloid fracture is unchanged. 3. Soft tissue swelling surrounding wrist. Electronically Signed   By: Ronney Asters M.D.   On: 03/08/2021 23:31   DG Wrist Complete Left  Result Date: 03/04/2021 CLINICAL DATA:  Left wrist pain after a fall earlier today. EXAM: LEFT WRIST - COMPLETE 3+ VIEW COMPARISON:  None. FINDINGS: Transverse mildly comminuted fractures of the distal left radial metaphysis with fracture lines extending to the radiocarpal and radioulnar joints. There is impaction of the fracture fragments with dorsal angulation of the distal fragments. Nondisplaced fracture across the base of the ulnar styloid process. Associated soft tissue swelling. Degenerative changes in the carpal joints. IMPRESSION: Acute fractures of the distal left radius and ulna as described. Electronically Signed   By: Lucienne Capers M.D.   On: 03/04/2021 16:14   DG Wrist Complete Right  Result Date: 03/04/2021 CLINICAL DATA:  Wrist pain after a fall earlier today. EXAM: RIGHT WRIST -  COMPLETE 3+ VIEW COMPARISON:  None. FINDINGS: Degenerative changes in the right wrist involving the STT and first carpometacarpal joints. Benign-appearing cyst in the scaphoid bone. No evidence of acute fracture or dislocation. No destructive or expansile bone lesions. Soft tissues are unremarkable. IMPRESSION: Degenerative changes in the right wrist. No acute bony abnormalities. Electronically Signed   By: Lucienne Capers M.D.   On: 03/04/2021 16:13   MR ANGIO HEAD WO CONTRAST  Result Date: 03/09/2021 CLINICAL DATA:  Initial evaluation for neuro deficit, stroke suspected. EXAM: MRI HEAD WITHOUT CONTRAST MRA HEAD  WITHOUT CONTRAST TECHNIQUE: Multiplanar, multi-echo pulse sequences of the brain and surrounding structures were acquired without intravenous contrast. Angiographic images of the Circle of Willis were acquired using MRA technique without intravenous contrast. COMPARISON:  Prior CT from 03/08/2021. FINDINGS: MRI HEAD FINDINGS Brain: Diffuse prominence of the CSF containing spaces compatible generalized cerebral atrophy. Patchy and confluent T2/FLAIR hyperintensity involving the periventricular and deep white matter blue cerebral hemispheres most consistent with chronic small vessel ischemic disease, mild to moderate in nature. Few remote lacunar infarcts present about the thalami. No abnormal foci of restricted diffusion to suggest acute or subacute ischemia. Gray-white matter differentiation maintained. No encephalomalacia to suggest chronic cortical infarction. No acute intracranial hemorrhage. 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 No other evidence for chronic intracranial hemorrhage. No mass lesion, midline shift or mass effect. Mild ventricular prominence related to global parenchymal volume loss without hydrocephalus. No extra-axial fluid collection. Pituitary gland suprasellar region within normal limits. Midline structures intact. Vascular: Major intracranial vascular flow voids are maintained. Skull and upper cervical spine: Craniocervical junction within normal limits. Bone marrow signal intensity normal. No scalp soft tissue abnormality. Sinuses/Orbits: Globes and orbital soft tissues within normal limits. Chronic left maxillary sinusitis. Scattered mucosal thickening noted elsewhere throughout the paranasal sinuses. Trace bilateral mastoid effusions. Visualized nasopharynx unremarkable. Inner ear structures grossly normal. Other: None. MRA HEAD FINDINGS Anterior circulation: Visualized distal cervical segments of the internal carotid arteries are  patent with antegrade flow. Right ICA widely patent to the terminus. There is focal mild-to-moderate stenosis involving the supraclinoid left ICA just prior to the terminus (series 1, image 98). Left ICA otherwise widely patent. A1 segments patent. Normal anterior communicating artery complex. Anterior cerebral arteries patent without stenosis. No M1 stenosis or occlusion. Normal MCA bifurcations. Distal MCA branches well perfused and symmetric. Posterior circulation: Visualized V4 segments patent without stenosis. Both PICA patent. Basilar patent to its distal aspect without stenosis. Superior cerebral arteries patent bilaterally. Right PCA primarily supplied via the basilar. Fetal type origin of the left PCA. 3 mm funnel shaped outpouching extending from the left aspect of the basilar tip most likely reflects a vascular infundibulum related to a hypoplastic left P1 segment. Both PCAs widely patent to their distal aspects. Anatomic variants: Fetal type origin of the left PCA.  No aneurysm. IMPRESSION: MRI HEAD IMPRESSION: 1. No acute intracranial abnormality. 2. Generalized cerebral atrophy with mild to moderate chronic microvascular ischemic disease. 3. Chronic left maxillary sinusitis. MRA HEAD IMPRESSION: 1. Negative intracranial MRA for large vessel occlusion. 2. 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 No other hemodynamically significant or correctable stenosis identified. Electronically Signed   By: Jeannine Boga M.D.   On: 03/09/2021 01:06   MR BRAIN WO CONTRAST  Result Date: 03/09/2021 CLINICAL DATA:  Initial evaluation for neuro deficit, stroke suspected. EXAM: MRI HEAD WITHOUT CONTRAST MRA  HEAD WITHOUT CONTRAST TECHNIQUE: Multiplanar, multi-echo pulse sequences of the brain and surrounding structures were acquired without intravenous contrast. Angiographic images of the Circle of Willis were acquired using MRA technique without  intravenous contrast. COMPARISON:  Prior CT from 03/08/2021. FINDINGS: MRI HEAD FINDINGS Brain: Diffuse prominence of the CSF containing spaces compatible generalized cerebral atrophy. Patchy and confluent T2/FLAIR hyperintensity involving the periventricular and deep white matter blue cerebral hemispheres most consistent with chronic small vessel ischemic disease, mild to moderate in nature. Few remote lacunar infarcts present about the thalami. No abnormal foci of restricted diffusion to suggest acute or subacute ischemia. Gray-white matter differentiation maintained. No encephalomalacia to suggest chronic cortical infarction. No acute intracranial hemorrhage. 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 No other evidence for chronic intracranial hemorrhage. No mass lesion, midline shift or mass effect. Mild ventricular prominence related to global parenchymal volume loss without hydrocephalus. No extra-axial fluid collection. Pituitary gland suprasellar region within normal limits. Midline structures intact. Vascular: Major intracranial vascular flow voids are maintained. Skull and upper cervical spine: Craniocervical junction within normal limits. Bone marrow signal intensity normal. No scalp soft tissue abnormality. Sinuses/Orbits: Globes and orbital soft tissues within normal limits. Chronic left maxillary sinusitis. Scattered mucosal thickening noted elsewhere throughout the paranasal sinuses. Trace bilateral mastoid effusions. Visualized nasopharynx unremarkable. Inner ear structures grossly normal. Other: None. MRA HEAD FINDINGS Anterior circulation: Visualized distal cervical segments of the internal carotid arteries are patent with antegrade flow. Right ICA widely patent to the terminus. There is focal mild-to-moderate stenosis involving the supraclinoid left ICA just prior to the terminus (series 1, image 98). Left ICA otherwise widely patent. A1  segments patent. Normal anterior communicating artery complex. Anterior cerebral arteries patent without stenosis. No M1 stenosis or occlusion. Normal MCA bifurcations. Distal MCA branches well perfused and symmetric. Posterior circulation: Visualized V4 segments patent without stenosis. Both PICA patent. Basilar patent to its distal aspect without stenosis. Superior cerebral arteries patent bilaterally. Right PCA primarily supplied via the basilar. Fetal type origin of the left PCA. 3 mm funnel shaped outpouching extending from the left aspect of the basilar tip most likely reflects a vascular infundibulum related to a hypoplastic left P1 segment. Both PCAs widely patent to their distal aspects. Anatomic variants: Fetal type origin of the left PCA.  No aneurysm. IMPRESSION: MRI HEAD IMPRESSION: 1. No acute intracranial abnormality. 2. Generalized cerebral atrophy with mild to moderate chronic microvascular ischemic disease. 3. Chronic left maxillary sinusitis. MRA HEAD IMPRESSION: 1. Negative intracranial MRA for large vessel occlusion. 2. 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 No other hemodynamically significant or correctable stenosis identified. Electronically Signed   By: Jeannine Boga M.D.   On: 03/09/2021 01:06   ECHOCARDIOGRAM COMPLETE  Result Date: 03/09/2021    ECHOCARDIOGRAM REPORT   Patient Name:   SONG GARRIS Date of Exam: 03/09/2021 Medical Rec #:  [id number]             Height:       72.0 in Accession #:    [id number]            Weight:       200.0 lb Date of Birth:  [birth year]              BSA:          2.130 m Patient Age:    81 years  BP:           163/81 mmHg Patient Gender: M                     HR:           59 bpm. Exam Location:  Inpatient Procedure: 2D Echo, Color Doppler and Cardiac Doppler Indications:    Stroke i63.9  History:        Patient has prior history of Echocardiogram examinations, most                  recent 11/11/2017. Risk Factors:Hypertension and Diabetes.  Sonographer:    Arlyss Gandy Referring Phys: [id number] [place]  1. Left ventricular ejection fraction, by estimation, is 60 to 65%. The left ventricle has normal function. The left ventricle has no regional wall motion abnormalities. There is moderate left ventricular hypertrophy. Left ventricular diastolic parameters are consistent with Grade I diastolic dysfunction (impaired relaxation).  2. Right ventricular systolic function is normal. The right ventricular size is normal.  3. The mitral valve is normal in structure. No evidence of mitral valve regurgitation. No evidence of mitral stenosis.  4. The aortic valve is tricuspid. There is mild calcification of the aortic valve. There is mild thickening of the aortic valve. Aortic valve regurgitation is mild. FINDINGS  Left Ventricle: Left ventricular ejection fraction, by estimation, is 60 to 65%. The left ventricle has normal function. The left ventricle has no regional wall motion abnormalities. The left ventricular internal cavity size was normal in size. There is  moderate left ventricular hypertrophy. Left ventricular diastolic parameters are consistent with Grade I diastolic dysfunction (impaired relaxation). Normal left ventricular filling pressure. Right Ventricle: The right ventricular size is normal. No increase in right ventricular wall thickness. Right ventricular systolic function is normal. Left Atrium: Left atrial size was normal in size. Right Atrium: Right atrial size was normal in size. Pericardium: There is no evidence of pericardial effusion. Mitral Valve: The mitral valve is normal in structure. No evidence of mitral valve regurgitation. No evidence of mitral valve stenosis. Tricuspid Valve: The tricuspid valve is normal in structure. Tricuspid valve regurgitation is not demonstrated. No evidence of tricuspid stenosis. Aortic Valve: The aortic valve is  tricuspid. There is mild calcification of the aortic valve. There is mild thickening of the aortic valve. There is mild aortic valve annular calcification. Aortic valve regurgitation is mild. Aortic valve mean gradient measures 3.0 mmHg. Aortic valve peak gradient measures 6.2 mmHg. Aortic valve area, by VTI measures 2.83 cm. Pulmonic Valve: The pulmonic valve was not well visualized. Pulmonic valve regurgitation is not visualized. No evidence of pulmonic stenosis. Aorta: The aortic root is normal in size and structure. IAS/Shunts: No atrial level shunt detected by color flow Doppler.  LEFT VENTRICLE PLAX 2D LVIDd:         5.20 cm   Diastology LVIDs:         3.70 cm   LV e' medial:    7.83 cm/s LV PW:         1.30 cm   LV E/e' medial:  9.2 LV IVS:        1.30 cm   LV e' lateral:   8.59 cm/s LVOT diam:     2.00 cm   LV E/e' lateral: 8.4 LV SV:         77 LV SV Index:   36 LVOT Area:     3.14 cm  RIGHT VENTRICLE  RV S prime:     14.60 cm/s TAPSE (M-mode): 1.9 cm LEFT ATRIUM             Index LA Vol (A2C):   55.8 ml 26.19 ml/m LA Vol (A4C):   47.2 ml 22.16 ml/m LA Biplane Vol: 52.4 ml 24.60 ml/m  AORTIC VALVE                    PULMONIC VALVE AV Area (Vmax):    2.99 cm     PV Vmax:       1.20 m/s AV Area (Vmean):   2.80 cm     PV Peak grad:  5.8 mmHg AV Area (VTI):     2.83 cm AV Vmax:           125.00 cm/s AV Vmean:          83.400 cm/s AV VTI:            0.272 m AV Peak Grad:      6.2 mmHg AV Mean Grad:      3.0 mmHg LVOT Vmax:         119.00 cm/s LVOT Vmean:        74.400 cm/s LVOT VTI:          0.245 m LVOT/AV VTI ratio: 0.90  AORTA Ao Root diam: 3.10 cm Ao Asc diam:  3.10 cm MITRAL VALVE MV Area (PHT): 3.27 cm     SHUNTS MV Decel Time: 232 msec     Systemic VTI:  0.24 m MV E velocity: 72.30 cm/s   Systemic Diam: 2.00 cm MV A velocity: 110.00 cm/s MV E/A ratio:  0.66 Carlyle Dolly MD Electronically signed by Carlyle Dolly MD Signature Date/Time: 03/09/2021/11:32:54 AM    Final    CT HEAD CODE STROKE WO  CONTRAST  Result Date: 03/08/2021 CLINICAL DATA:  Code stroke. Initial evaluation for acute left-sided weakness. EXAM: CT HEAD WITHOUT CONTRAST TECHNIQUE: Contiguous axial images were obtained from the base of the skull through the vertex without intravenous contrast. COMPARISON:  Prior CT from 10/20/2018. FINDINGS: Brain: Age-related cerebral atrophy with chronic microvascular ischemic disease. Scattered multifocal areas of calcification/mineralization about the bilateral thalami noted, similar to previous. No acute intracranial hemorrhage. No acute large vessel territory infarct. No mass lesion, midline shift or mass effect. Stable ventricular size and morphology without hydrocephalus. No extra-axial fluid collection. Vascular: No hyperdense vessel. Scattered vascular calcifications noted within the carotid siphons. Skull: Scalp soft tissues and calvarium within normal limits. Sinuses/Orbits: Globes orbital soft tissues demonstrate no acute finding. Chronic left maxillary sinusitis noted. Paranasal sinuses are otherwise largely clear. No mastoid effusion. Other: None. ASPECTS [facility] Stroke Program Early CT Score) - Ganglionic level infarction (caudate, lentiform nuclei, internal capsule, insula, M1-M3 cortex): 7 - Supraganglionic infarction (M4-M6 cortex): 3 Total score (0-10 with 10 being normal): 10 IMPRESSION: 1. No acute intracranial infarct or other abnormality. 2. ASPECTS is 10. 3. Age-related cerebral atrophy with chronic microvascular ischemic disease. Superimposed scattered multifocal areas of calcification/mineralization about the bilateral thalami, stable. These results were communicated to Dr. Cheral Marker at 10:38 pm on 03/08/2021 by text page via the [facility] messaging system. Electronically Signed   By: Jeannine Boga M.D.   On: 03/08/2021 22:40   VAS US CAROTID (at [facility] and [facility] only)  Result Date: 03/09/2021 Carotid Arterial Duplex Study Patient Name:  CUTTER PASSEY  Date of Exam:   03/09/2021  Medical Rec #: [id number]  Accession #:    [id number] Date of Birth: 17-Jul-1940               Patient Gender: M Patient Age:   74 years Exam Location:  [facility] Procedure:      VAS US CAROTID Referring Phys: Christia Reading OPYD --------------------------------------------------------------------------------  Indications:       CVA. Risk Factors:      Hypertension, Diabetes, current smoker, prior CVA. Limitations        Today's exam was limited due to the patient's respiratory                    variation. Comparison Study:  11-11-2017 Carotid duplex showed 1-39% stenosis bilaterally. Performing Technologist: Darlin Coco RDMS, RVT  Examination Guidelines: A complete evaluation includes B-mode imaging, spectral Doppler, color Doppler, and power Doppler as needed of all accessible portions of each vessel. Bilateral testing is considered an integral part of a complete examination. Limited examinations for reoccurring indications may be performed as noted.  Right Carotid Findings: +----------+--------+--------+--------+------------------+--------+             PSV cm/s EDV cm/s Stenosis Plaque Description Comments  +----------+--------+--------+--------+------------------+--------+  CCA Prox   122      15                                             +----------+--------+--------+--------+------------------+--------+  CCA Distal 78       9                                              +----------+--------+--------+--------+------------------+--------+  ICA Prox   89       15       1-39%    heterogenous                 +----------+--------+--------+--------+------------------+--------+  ICA Distal 67       10                                             +----------+--------+--------+--------+------------------+--------+  ECA        120                                                     +----------+--------+--------+--------+------------------+--------+  +----------+--------+-------+----------------+-------------------+             PSV cm/s EDV cms Describe         Arm Pressure (mmHG)  +----------+--------+-------+----------------+-------------------+  Subclavian 90               Multiphasic, WNL                      +----------+--------+-------+----------------+-------------------+ +---------+--------+--+--------+--+---------+  Vertebral PSV cm/s 56 EDV cm/s 11 Antegrade  +---------+--------+--+--------+--+---------+  Left Carotid Findings: +----------+--------+--------+--------+------------------+--------+             PSV cm/s EDV cm/s Stenosis Plaque Description Comments  +----------+--------+--------+--------+------------------+--------+  CCA Prox   114      10                                             +----------+--------+--------+--------+------------------+--------+  CCA Distal 75       10                                             +----------+--------+--------+--------+------------------+--------+  ICA Prox   107      16       1-39%    heterogenous                 +----------+--------+--------+--------+------------------+--------+  ICA Distal 62       13                                             +----------+--------+--------+--------+------------------+--------+  ECA        145      11                                             +----------+--------+--------+--------+------------------+--------+ +----------+--------+--------+--------+-------------------+             PSV cm/s EDV cm/s Describe Arm Pressure (mmHG)  +----------+--------+--------+--------+-------------------+  Subclavian 245               Stenotic                      +----------+--------+--------+--------+-------------------+ +---------+--------+--+--------+--+---------+  Vertebral PSV cm/s 73 EDV cm/s 13 Antegrade  +---------+--------+--+--------+--+---------+   Summary: Right Carotid: Velocities in the right ICA are consistent with a 1-39% stenosis. Left Carotid: Velocities in the left  ICA are consistent with a 1-39% stenosis. Vertebrals:  Bilateral vertebral arteries demonstrate antegrade flow. Subclavians: Left subclavian artery was stenotic. Normal flow hemodynamics were              seen in the right subclavian artery. *See table(s) above for measurements and observations.  Electronically signed by Antony Contras MD on 03/09/2021 at 1:01:44 PM.    Final      Subjective: No complaints "I feel terrific." No weakness or numbness. His left arm/wrist does not hurt currently. He is having the urge to urinate every 15 minutes with continued small amounts voided each time.   Discharge Exam: Vitals:   03/11/21 0851 03/11/21 1225  BP: (!) 173/83 (!) 178/69  Pulse: 62 77  Resp: 16   Temp: 98.1 F (36.7 C) 98.9 F (37.2 C)  SpO2: 97% 94%   General: Pt is alert, awake, not in acute distress Cardiovascular: RRR, S1/S2 +, no rubs, no gallops Respiratory: CTA bilaterally, no wheezing, no rhonchi Abdominal: Soft, NT, ND, bowel sounds +. Mild suprapubic tenderness without palpable bladder/mass. Extremities: No pitting edema, no cyanosis. LUE with warm hand, brisk cap refill, sensation and motor function intact.   Labs: BNP (last 3 results) No results for input(s): BNP in the last 8760 hours. Basic Metabolic Panel: Recent Labs  Lab 03/08/21 2215 03/08/21 2219  NA 138 141  K 3.9 3.9  CL 108 107  CO2 22  --   GLUCOSE 115* 111*  BUN 16 20  CREATININE 1.15 1.00  CALCIUM 9.0  --    Liver Function Tests: Recent Labs  Lab 03/08/21 2215  AST 21  ALT 24  ALKPHOS 72  BILITOT 0.5  PROT 6.7  ALBUMIN 3.5   No results for input(s): LIPASE, AMYLASE in the last 168 hours. No results for input(s): AMMONIA in the last 168 hours. CBC: Recent Labs  Lab 03/08/21 2215 03/08/21 2219 03/09/21 0700  WBC 10.0  --  12.1*  NEUTROABS 6.0  --   --   HGB 13.9 14.6 13.9  HCT 42.6 43.0 42.2  MCV 87.5  --  88.5  PLT 337  --  356   Cardiac Enzymes: No results for input(s): CKTOTAL,  CKMB, CKMBINDEX, TROPONINI in the last 168 hours. BNP: Invalid input(s): POCBNP CBG: Recent Labs  Lab 03/10/21 1259 03/10/21 1735 03/10/21 2129 03/11/21 0601 03/11/21 1113  GLUCAP 118* 104* 106* 111* 114*   D-Dimer No results for input(s): DDIMER in the last 72 hours. Hgb A1c Recent Labs    03/09/21 0700  HGBA1C 5.8*   Lipid Profile Recent Labs    03/09/21 0700  CHOL 161  HDL 42  LDLCALC 96  TRIG 114  CHOLHDL 3.8   Thyroid function studies No results for input(s): TSH, T4TOTAL, T3FREE, THYROIDAB in the last 72 hours.  Invalid input(s): FREET3 Anemia work up No results for input(s): VITAMINB12, FOLATE, FERRITIN, TIBC, IRON, RETICCTPCT in the last 72 hours. Urinalysis    Component Value Date/Time   COLORURINE YELLOW 03/10/2021 1531   APPEARANCEUR CLEAR 03/10/2021 1531   LABSPEC 1.025 03/10/2021 1531   PHURINE 6.0 03/10/2021 1531   GLUCOSEU NEGATIVE 03/10/2021 1531   HGBUR NEGATIVE 03/10/2021 1531   BILIRUBINUR NEGATIVE 03/10/2021 1531   KETONESUR NEGATIVE 03/10/2021 1531   PROTEINUR NEGATIVE 03/10/2021 1531   NITRITE NEGATIVE 03/10/2021 1531   LEUKOCYTESUR NEGATIVE 03/10/2021 1531    Microbiology Recent Results (from the past 240 hour(s))  Resp Panel by RT-PCR (Flu A&B, Covid) Nasopharyngeal Swab     Status: None   Collection Time: 03/09/21  7:12 AM   Specimen: Nasopharyngeal Swab; Nasopharyngeal(NP) swabs in vial transport medium  Result Value Ref Range Status   SARS Coronavirus 2 by RT PCR NEGATIVE NEGATIVE Final    Comment: (NOTE) SARS-CoV-2 target nucleic acids are NOT DETECTED.  The SARS-CoV-2 RNA is generally detectable in upper respiratory specimens during the acute phase of infection. The lowest concentration of SARS-CoV-2 viral copies this assay can detect is 138 copies/mL. A negative result does not preclude SARS-Cov-2 infection and should not be used as the sole basis for treatment or other patient management decisions. A negative result  may occur with  improper specimen collection/handling, submission of specimen other than nasopharyngeal swab, presence of viral mutation(s) within the areas targeted by this assay, and inadequate number of viral copies(<138 copies/mL). A negative result must be combined with clinical observations, patient history, and epidemiological information. The expected result is Negative.  Fact Sheet for Patients:  EntrepreneurPulse.com.au  Fact Sheet for Healthcare Providers:  IncredibleEmployment.be  This test is no t yet approved or cleared by the [place] FDA and  has been authorized for detection and/or diagnosis of SARS-CoV-2 by FDA under an Emergency Use Authorization (EUA). This EUA will remain  in effect (meaning this test can be used) for the duration of the COVID-19 declaration under Section 564(b)(1) of the Act, 21 U.S.C.section 360bbb-3(b)(1), unless the authorization is terminated  or revoked sooner.       Influenza A by PCR NEGATIVE NEGATIVE Final   Influenza B by PCR NEGATIVE NEGATIVE Final    Comment: (NOTE) The Xpert Xpress SARS-CoV-2/FLU/RSV plus assay is intended as an aid in the diagnosis  of influenza from Nasopharyngeal swab specimens and should not be used as a sole basis for treatment. Nasal washings and aspirates are unacceptable for Xpert Xpress SARS-CoV-2/FLU/RSV testing.  Fact Sheet for Patients: EntrepreneurPulse.com.au  Fact Sheet for Healthcare Providers: IncredibleEmployment.be  This test is not yet approved or cleared by the [place] FDA and has been authorized for detection and/or diagnosis of SARS-CoV-2 by FDA under an Emergency Use Authorization (EUA). This EUA will remain in effect (meaning this test can be used) for the duration of the COVID-19 declaration under Section 564(b)(1) of the Act, 21 U.S.C. section 360bbb-3(b)(1), unless the authorization is terminated  or revoked.  Performed at [facility], [place] [address], [place], [place] [address]     Time coordinating discharge: Approximately 40 minutes  [name], MD  [facility] 03/11/2021, 2:08 PM

## 2021-03-11 NOTE — TOC Transition Note (Signed)
Transition of Care [facility]) - CM/SW Discharge Note   Patient Details  Name: Matthew Knight MRN: [id number] Date of Birth: 05/27/1940  [facility] of [facility]) CM/SW Contact:  Pollie Friar, RN Phone Number: 03/11/2021, 11:57 AM   Clinical Narrative:    Patient is discharging to [facility] SNF today. Pt will transport via ambulance. Daughter updated and d/c packet is at the desk. RN also updated.   Number for report: [phone]   Final next level of care: Skilled Nursing Facility Barriers to Discharge: No Barriers Identified   Patient Goals and CMS Choice   CMS Medicare.gov Compare Post Acute Care list provided to:: Patient Represenative (must comment) Choice offered to / list presented to : Adult Children  Discharge Placement PASRR number recieved: 03/10/21            Patient chooses bed at: [facility] Patient to be transferred to facility by: Ambulance Name of family member notified: daughter--Andrea Patient and family notified of of transfer: 03/11/21  Discharge Plan and Services In-house Referral: Clinical Social Work Discharge Planning Services: [employer] Consult Post Acute Care Choice: [place]                               Social Determinants of Health (SDOH) Interventions     Readmission Risk Interventions No flowsheet data found.

## 2021-03-11 NOTE — Progress Notes (Signed)
Occupational Therapy Treatment Patient Details Name: Matthew Knight MRN: [id number] DOB: Feb 21, 1941 Today's Date: 03/11/2021   History of present illness Pt is a 81 year old man admitted on 03/08/21 with new onset L side weakness and fall. Pt fell last week and fractured his L wrist which is now splinted. Brain MRI negative for acute changes. PMH: DM, dementia, hepatitis, hearing loss, small vessel disease.   OT comments  Pt making progress with OT goals this session. Pt requiring min guard for all ADL's due to safety with balance and minimal cuing required for attention/sequencing. Pt completed increased functional mobility this session, participating in tasks and mobility for 15+ mins this session. Continuing to recommend SNF if pt is unable to have 24/7 supervision due to safety with all ADL's and mobility. OT will continue to follow acutely.    Recommendations for follow up therapy are one component of a multi-disciplinary discharge planning process, led by the attending physician.  Recommendations may be updated based on patient status, additional functional criteria and insurance authorization.    Follow Up Recommendations  Skilled nursing-short term rehab (<3 hours/day) (Pt would benefit from ALF due to needing 24/7 supervision due to cognition and increased fall risks/weakness)    Assistance Recommended at Discharge Frequent or constant Supervision/Assistance  Patient can return home with the following  A little help with walking and/or transfers;A little help with bathing/dressing/bathroom;Assistance with cooking/housework;Direct supervision/assist for medications management;Direct supervision/assist for financial management   Equipment Recommendations  BSC/3in1    Recommendations for Other Services      Precautions / Restrictions Precautions Precautions: Fall Restrictions Weight Bearing Restrictions: No       Mobility Bed Mobility Overal bed mobility: Needs  Assistance Bed Mobility: Supine to Sit     Supine to sit: Min guard     General bed mobility comments: min g to power up to standing    Transfers Overall transfer level: Needs assistance Equipment used: 1 person hand held assist Transfers: Sit to/from Stand Sit to Stand: Min guard           General transfer comment: Min G for safety with steadying     Balance Overall balance assessment: Needs assistance Sitting-balance support: Feet supported Sitting balance-Leahy Scale: Good     Standing balance support: During functional activity;No upper extremity supported Standing balance-Leahy Scale: Fair Standing balance comment: fair-poor for grooming at the sink once fatigued                           ADL either performed or assessed with clinical judgement   ADL Overall ADL's : Needs assistance/impaired     Grooming: Wash/dry hands;Wash/dry face;Oral care;Min guard;Standing Grooming Details (indicate cue type and reason): completed at sink, min guard for safety due to wavering balance                 Toilet Transfer: Magazine features editor Details (indicate cue type and reason): Pt holding on to OT's arm for ambulation, overall balance fair, ambulated in hall, then to bathroom Toileting- Clothing Manipulation and Hygiene: Min guard;Sitting/lateral lean;Sit to/from stand Toileting - Clothing Manipulation Details (indicate cue type and reason): Completed in bathroom, min guard for standing.     Functional mobility during ADLs: Min guard General ADL Comments: min g for sfaety    Extremity/Trunk Assessment              Vision       Perception  Praxis      Cognition Arousal/Alertness: Awake/alert Behavior During Therapy: WFL for tasks assessed/performed Overall Cognitive Status: History of cognitive impairments - at baseline                                 General Comments: Pt with history of dementia per chart, Cog this  session WFL, following commands, poor safety awareness, Oriented x4          Exercises     Shoulder Instructions       General Comments VSS on RA    Pertinent Vitals/ Pain       Pain Assessment: No/denies pain  Home Living                                          Prior Functioning/Environment              Frequency  Min 2X/week        Progress Toward Goals  OT Goals(current goals can now be found in the care plan section)  Progress towards OT goals: Progressing toward goals  Acute Rehab OT Goals OT Goal Formulation: With patient Time For Goal Achievement: 03/23/21 Potential to Achieve Goals: Good ADL Goals Pt Will Perform Grooming: with min assist;standing Pt Will Perform Lower Body Bathing: with min assist;sit to/from stand Pt Will Transfer to Toilet: with min assist;ambulating;bedside commode Pt Will Perform Toileting - Clothing Manipulation and hygiene: with min assist;sit to/from stand Additional ADL Goal #1: Pt will perform bed mobility modified independently in preparation for ADL.  Plan Discharge plan needs to be updated    Co-evaluation                 AM-PAC OT "6 Clicks" Daily Activity     Outcome Measure   Help from another person eating meals?: A Little Help from another person taking care of personal grooming?: A Little Help from another person toileting, which includes using toliet, bedpan, or urinal?: A Little Help from another person bathing (including washing, rinsing, drying)?: A Little Help from another person to put on and taking off regular upper body clothing?: A Little Help from another person to put on and taking off regular lower body clothing?: A Little 6 Click Score: 18    End of Session Equipment Utilized During Treatment: Gait belt  OT Visit Diagnosis: Unsteadiness on feet (R26.81);Muscle weakness (generalized) (M62.81);Other symptoms and signs involving cognitive function   Activity Tolerance  Patient tolerated treatment well   Patient Left in chair;with chair alarm set;with call bell/phone within reach   Nurse Communication Mobility status        Time: 2355-7322 OT Time Calculation (min): 15 min  Charges: OT General Charges $OT Visit: 1 Visit OT Treatments $Self Care/Home Management : 8-22 mins  Wilba Mutz H., OTR/L Acute Rehabilitation  Alejandra Hunt Elane Renold Kozar 03/11/2021, 4:02 PM

## 2021-03-12 ENCOUNTER — Emergency Department (HOSPITAL_COMMUNITY)
Admission: EM | Admit: 2021-03-12 | Discharge: 2021-03-13 | Disposition: A | Discharge status: Home / Self Care | Payer: Medicare Other | Attending: Emergency Medicine | Admitting: Emergency Medicine

## 2021-03-12 ENCOUNTER — Other Ambulatory Visit: Payer: Self-pay

## 2021-03-12 ENCOUNTER — Encounter (HOSPITAL_COMMUNITY): Payer: Self-pay

## 2021-03-12 DIAGNOSIS — R339 Retention of urine, unspecified: Secondary | ICD-10-CM | POA: Diagnosis present

## 2021-03-12 DIAGNOSIS — Z7982 Long term (current) use of aspirin: Secondary | ICD-10-CM | POA: Insufficient documentation

## 2021-03-12 DIAGNOSIS — Z79899 Other long term (current) drug therapy: Secondary | ICD-10-CM | POA: Diagnosis not present

## 2021-03-12 DIAGNOSIS — Z7902 Long term (current) use of antithrombotics/antiplatelets: Secondary | ICD-10-CM | POA: Insufficient documentation

## 2021-03-12 DIAGNOSIS — R338 Other retention of urine: Secondary | ICD-10-CM

## 2021-03-12 NOTE — ED Notes (Signed)
[facility] called for transport.  

## 2021-03-12 NOTE — ED Triage Notes (Signed)
Patient BIB [facility] from [facility]. Patient had a catheter taken out this morning but has been unable to void since removal. Recent fall with a broken wrist. Patient has dementia.

## 2021-03-12 NOTE — ED Provider Notes (Signed)
[facility] DEPT Provider Note   CSN: [id number] Arrival date & time: 03/12/21  2051  LEVEL 5 CAVEAT - DEMENTIA   History  Chief Complaint  Patient presents with   Urinary Retention    Matthew Knight is a 81 y.o. male.  HPI 81 year old male presents with urinary retention. History is primarily from nurse who spoke to EMS and the tech at [facility]. He was discharged from the hospital yesterday and today the staff tried to take out his catheter for a voiding trial. This was at 11 am. He has not urinated at all since, and by 6 pm they decided to call EMS. Staff tried to place a catheter twice without success. No fevers. Patient denies any pain. Some lower abdominal discomfort like he needs to urinate.    Home Medications Prior to Admission medications   Medication Sig Start Date End Date Taking? Authorizing Provider  acetaminophen (TYLENOL) 325 MG tablet Take 2 tablets (650 mg total) by mouth every 4 (four) hours as needed for mild pain, moderate pain, fever or headache. 03/11/21   [name], MD  aspirin EC 81 MG tablet Take 81 mg by mouth daily.  Patient not taking: Reported on 03/09/2021    [provider]  clopidogrel (PLAVIX) 75 MG tablet Take 1 tablet (75 mg total) by mouth daily for 21 days. 03/11/21 04/01/21  [name], MD  escitalopram (LEXAPRO) 20 MG tablet Take 20 mg by mouth daily. 07/11/17   [provider]  isosorbide mononitrate (IMDUR) 30 MG 24 hr tablet Take 30 mg by mouth daily. 04/28/20   [provider]  latanoprost (XALATAN) 0.005 % ophthalmic solution Place 1 drop into both eyes at bedtime.  07/27/17   [provider]  lisinopril (ZESTRIL) 40 MG tablet Take 40 mg by mouth daily. 02/01/20   [provider]  omeprazole (PRILOSEC) 20 MG capsule Take 20 mg by mouth daily. 04/07/20   [provider]  pravastatin (PRAVACHOL) 20 MG tablet Take 20 mg by mouth daily. Patient not  taking: Reported on 03/09/2021 04/28/20   [provider]  RHOPRESSA 0.02 % SOLN Place 1 drop into both eyes daily at 6 (six) AM. 11/15/18   [provider]  rivastigmine (EXELON) 4.6 mg/24hr Place 4.6 mg onto the skin daily. 12/12/19   [provider]      Allergies    Darunavir, Sulfa antibiotics, and No known allergies    Review of Systems   Review of Systems  Unable to perform ROS: Dementia   Physical Exam Updated Vital Signs BP (!) 148/64 (BP Location: Right Arm)    Pulse (!) 56    Temp 97.7 F (36.5 C) (Oral)    Resp 15    SpO2 100%  Physical Exam Vitals and nursing note reviewed.  Constitutional:      Appearance: He is well-developed.  HENT:     Head: Normocephalic and atraumatic.  Cardiovascular:     Rate and Rhythm: Normal rate and regular rhythm.     Heart sounds: Normal heart sounds.  Pulmonary:     Effort: Pulmonary effort is normal.     Breath sounds: Normal breath sounds.  Abdominal:     General: There is distension (lower abdomen).     Palpations: Abdomen is soft.     Tenderness: There is no abdominal tenderness. There is no right CVA tenderness or left CVA tenderness.  Skin:    General: Skin is  warm and dry.  Neurological:     Mental Status: He is alert.    ED Results / Procedures / Treatments   Labs (all labs ordered are listed, but only abnormal results are displayed) Labs Reviewed - No data to display  EKG None  Radiology No results found.  Procedures Procedures    Medications Ordered in ED Medications - No data to display  ED Course/ Medical Decision Making/ A&P                           Medical Decision Making  I have reviewed the discharge summary and some progress notes from yesterday/recent admission and it seems he had a foley placed yesterday for retention. Now he is unable to void. Foley catheter placed by nursing staff with 1 attempt. No obvious complications. I have considered labwork but don't think it's  necessary as his foley was likely taken out too early and he's had only several hours of retention so I think kidney injury is unlikely. Will discharge to facility with referral to urology.        Final Clinical Impression(s) / ED Diagnoses Final diagnoses:  Acute urinary retention    Rx / DC Orders ED Discharge Orders     None         Sherwood Gambler, MD 03/12/21 2244

## 2021-03-13 ENCOUNTER — Ambulatory Visit (INDEPENDENT_AMBULATORY_CARE_PROVIDER_SITE_OTHER): Payer: Self-pay | Admitting: Podiatry

## 2021-03-13 DIAGNOSIS — Z91199 Patient's noncompliance with other medical treatment and regimen due to unspecified reason: Secondary | ICD-10-CM

## 2021-03-13 NOTE — ED Notes (Signed)
Warm blankets given to patient.

## 2021-03-14 NOTE — Progress Notes (Signed)
   Complete physical exam  Patient: Matthew Knight   DOB: 12/26/1997   81 y.o. Male  MRN: [id number]  Subjective:    No chief complaint on file.   Matthew Knight is a 81 y.o. male who presents today for a complete physical exam. She reports consuming a {diet types:17450} diet. {types:19826} She generally feels {DESC; WELL/FAIRLY WELL/POORLY:18703}. She reports sleeping {DESC; WELL/FAIRLY WELL/POORLY:18703}. She {does/does not:200015} have additional problems to discuss today.    Most recent fall risk assessment:    09/02/2021   10:42 AM  Fall Risk   Falls in the past year? 0  Number falls in past yr: 0  Injury with Fall? 0  Risk for fall due to : No Fall Risks  Follow up Falls evaluation completed     Most recent depression screenings:    09/02/2021   10:42 AM 07/24/2020   10:46 AM  PHQ 2/9 Scores  PHQ - 2 Score 0 0  PHQ- 9 Score 5     {VISON DENTAL STD PSA (Optional):27386}  {History (Optional):23778}  Patient Care Team: Jessup, Joy, NP as PCP - General (Nurse Practitioner)   Outpatient Medications Prior to Visit  Medication Sig   fluticasone (FLONASE) 50 MCG/ACT nasal spray Place 2 sprays into both nostrils in the morning and at bedtime. After 7 days, reduce to once daily.   norgestimate-ethinyl estradiol (SPRINTEC 28) 0.25-35 MG-MCG tablet Take 1 tablet by mouth daily.   Nystatin POWD Apply liberally to affected area 2 times per day   spironolactone (ALDACTONE) 100 MG tablet Take 1 tablet (100 mg total) by mouth daily.   No facility-administered medications prior to visit.    ROS        Objective:     There were no vitals taken for this visit. {Vitals History (Optional):23777}  Physical Exam   No results found for any visits on 10/08/21. {Show previous labs (optional):23779}    Assessment & Plan:    Routine Health Maintenance and Physical Exam  Immunization History  Administered Date(s) Administered   DTaP 03/11/1999, 05/07/1999,  07/16/1999, 03/31/2000, 10/15/2003   Hepatitis A 08/11/2007, 08/16/2008   Hepatitis B 12/27/1998, 02/03/1999, 07/16/1999   HiB (PRP-OMP) 03/11/1999, 05/07/1999, 07/16/1999, 03/31/2000   IPV 03/11/1999, 05/07/1999, 01/04/2000, 10/15/2003   Influenza,inj,Quad PF,6+ Mos 11/16/2013   Influenza-Unspecified 02/16/2012   MMR 01/03/2001, 10/15/2003   Meningococcal Polysaccharide 08/16/2011   Pneumococcal Conjugate-13 03/31/2000   Pneumococcal-Unspecified 07/16/1999, 09/29/1999   Tdap 08/16/2011   Varicella 01/04/2000, 08/11/2007    Health Maintenance  Topic Date Due   HIV Screening  Never done   Hepatitis C Screening  Never done   INFLUENZA VACCINE  10/06/2021   PAP-Cervical Cytology Screening  10/08/2021 (Originally 12/26/2019)   PAP SMEAR-Modifier  10/08/2021 (Originally 12/26/2019)   TETANUS/TDAP  10/08/2021 (Originally 08/15/2021)   HPV VACCINES  Discontinued   COVID-19 Vaccine  Discontinued    Discussed health benefits of physical activity, and encouraged her to engage in regular exercise appropriate for her age and condition.  Problem List Items Addressed This Visit   None Visit Diagnoses     Annual physical exam    -  Primary   Cervical cancer screening       Need for Tdap vaccination          No follow-ups on file.     Joy Jessup, NP   

## 2021-03-19 ENCOUNTER — Inpatient Hospital Stay (HOSPITAL_COMMUNITY)
Admission: EM | Admit: 2021-03-19 | Discharge: 2021-03-25 | DRG: 378 | Disposition: A | Discharge status: Skilled Nursing Facility | Payer: Medicare Other | Source: Skilled Nursing Facility | Attending: Family Medicine | Admitting: Family Medicine

## 2021-03-19 ENCOUNTER — Emergency Department (HOSPITAL_COMMUNITY): Payer: Medicare Other

## 2021-03-19 ENCOUNTER — Encounter (HOSPITAL_COMMUNITY): Payer: Self-pay | Admitting: Emergency Medicine

## 2021-03-19 DIAGNOSIS — K449 Diaphragmatic hernia without obstruction or gangrene: Secondary | ICD-10-CM | POA: Diagnosis present

## 2021-03-19 DIAGNOSIS — Z7902 Long term (current) use of antithrombotics/antiplatelets: Secondary | ICD-10-CM

## 2021-03-19 DIAGNOSIS — K5521 Angiodysplasia of colon with hemorrhage: Secondary | ICD-10-CM | POA: Diagnosis not present

## 2021-03-19 DIAGNOSIS — S52502S Unspecified fracture of the lower end of left radius, sequela: Secondary | ICD-10-CM

## 2021-03-19 DIAGNOSIS — Z9049 Acquired absence of other specified parts of digestive tract: Secondary | ICD-10-CM

## 2021-03-19 DIAGNOSIS — I10 Essential (primary) hypertension: Secondary | ICD-10-CM | POA: Diagnosis present

## 2021-03-19 DIAGNOSIS — R338 Other retention of urine: Secondary | ICD-10-CM | POA: Diagnosis present

## 2021-03-19 DIAGNOSIS — S52572A Other intraarticular fracture of lower end of left radius, initial encounter for closed fracture: Secondary | ICD-10-CM | POA: Diagnosis present

## 2021-03-19 DIAGNOSIS — Z833 Family history of diabetes mellitus: Secondary | ICD-10-CM

## 2021-03-19 DIAGNOSIS — E119 Type 2 diabetes mellitus without complications: Secondary | ICD-10-CM | POA: Diagnosis present

## 2021-03-19 DIAGNOSIS — D75839 Thrombocytosis, unspecified: Secondary | ICD-10-CM | POA: Diagnosis present

## 2021-03-19 DIAGNOSIS — Z882 Allergy status to sulfonamides status: Secondary | ICD-10-CM

## 2021-03-19 DIAGNOSIS — D62 Acute posthemorrhagic anemia: Secondary | ICD-10-CM | POA: Insufficient documentation

## 2021-03-19 DIAGNOSIS — W19XXXA Unspecified fall, initial encounter: Secondary | ICD-10-CM | POA: Diagnosis present

## 2021-03-19 DIAGNOSIS — N401 Enlarged prostate with lower urinary tract symptoms: Secondary | ICD-10-CM | POA: Diagnosis present

## 2021-03-19 DIAGNOSIS — F17290 Nicotine dependence, other tobacco product, uncomplicated: Secondary | ICD-10-CM | POA: Diagnosis present

## 2021-03-19 DIAGNOSIS — Z20822 Contact with and (suspected) exposure to covid-19: Secondary | ICD-10-CM | POA: Diagnosis present

## 2021-03-19 DIAGNOSIS — K635 Polyp of colon: Secondary | ICD-10-CM | POA: Diagnosis present

## 2021-03-19 DIAGNOSIS — S52602S Unspecified fracture of lower end of left ulna, sequela: Secondary | ICD-10-CM

## 2021-03-19 DIAGNOSIS — G8194 Hemiplegia, unspecified affecting left nondominant side: Secondary | ICD-10-CM | POA: Diagnosis present

## 2021-03-19 DIAGNOSIS — Z7982 Long term (current) use of aspirin: Secondary | ICD-10-CM

## 2021-03-19 DIAGNOSIS — W06XXXA Fall from bed, initial encounter: Secondary | ICD-10-CM | POA: Diagnosis present

## 2021-03-19 DIAGNOSIS — S52615A Nondisplaced fracture of left ulna styloid process, initial encounter for closed fracture: Secondary | ICD-10-CM | POA: Diagnosis present

## 2021-03-19 DIAGNOSIS — K2970 Gastritis, unspecified, without bleeding: Secondary | ICD-10-CM | POA: Diagnosis present

## 2021-03-19 DIAGNOSIS — K3189 Other diseases of stomach and duodenum: Secondary | ICD-10-CM | POA: Diagnosis present

## 2021-03-19 DIAGNOSIS — Z79899 Other long term (current) drug therapy: Secondary | ICD-10-CM

## 2021-03-19 DIAGNOSIS — K5520 Angiodysplasia of colon without hemorrhage: Secondary | ICD-10-CM

## 2021-03-19 DIAGNOSIS — R195 Other fecal abnormalities: Secondary | ICD-10-CM | POA: Insufficient documentation

## 2021-03-19 DIAGNOSIS — F0390 Unspecified dementia without behavioral disturbance: Secondary | ICD-10-CM | POA: Diagnosis present

## 2021-03-19 DIAGNOSIS — Y92129 Unspecified place in nursing home as the place of occurrence of the external cause: Secondary | ICD-10-CM

## 2021-03-19 DIAGNOSIS — D649 Anemia, unspecified: Secondary | ICD-10-CM

## 2021-03-19 DIAGNOSIS — N289 Disorder of kidney and ureter, unspecified: Secondary | ICD-10-CM

## 2021-03-19 DIAGNOSIS — N179 Acute kidney failure, unspecified: Secondary | ICD-10-CM | POA: Diagnosis present

## 2021-03-19 DIAGNOSIS — K922 Gastrointestinal hemorrhage, unspecified: Secondary | ICD-10-CM | POA: Diagnosis not present

## 2021-03-19 DIAGNOSIS — K219 Gastro-esophageal reflux disease without esophagitis: Secondary | ICD-10-CM | POA: Diagnosis present

## 2021-03-19 DIAGNOSIS — I951 Orthostatic hypotension: Secondary | ICD-10-CM | POA: Diagnosis present

## 2021-03-19 DIAGNOSIS — H9190 Unspecified hearing loss, unspecified ear: Secondary | ICD-10-CM | POA: Diagnosis present

## 2021-03-19 DIAGNOSIS — S0990XA Unspecified injury of head, initial encounter: Secondary | ICD-10-CM | POA: Diagnosis present

## 2021-03-19 DIAGNOSIS — Z888 Allergy status to other drugs, medicaments and biological substances status: Secondary | ICD-10-CM

## 2021-03-19 DIAGNOSIS — Z66 Do not resuscitate: Secondary | ICD-10-CM | POA: Diagnosis present

## 2021-03-19 DIAGNOSIS — Y92009 Unspecified place in unspecified non-institutional (private) residence as the place of occurrence of the external cause: Secondary | ICD-10-CM

## 2021-03-19 DIAGNOSIS — K648 Other hemorrhoids: Secondary | ICD-10-CM | POA: Diagnosis present

## 2021-03-19 DIAGNOSIS — E785 Hyperlipidemia, unspecified: Secondary | ICD-10-CM | POA: Diagnosis present

## 2021-03-19 DIAGNOSIS — D72829 Elevated white blood cell count, unspecified: Secondary | ICD-10-CM | POA: Diagnosis present

## 2021-03-19 DIAGNOSIS — K644 Residual hemorrhoidal skin tags: Secondary | ICD-10-CM | POA: Diagnosis present

## 2021-03-19 DIAGNOSIS — D500 Iron deficiency anemia secondary to blood loss (chronic): Secondary | ICD-10-CM

## 2021-03-19 DIAGNOSIS — R55 Syncope and collapse: Secondary | ICD-10-CM

## 2021-03-19 HISTORY — DX: Unspecified fall, initial encounter: W19.XXXA

## 2021-03-19 HISTORY — DX: Cerebral infarction, unspecified: I63.9

## 2021-03-19 HISTORY — DX: Unspecified dementia, unspecified severity, without behavioral disturbance, psychotic disturbance, mood disturbance, and anxiety: F03.90

## 2021-03-19 HISTORY — DX: Unspecified place in unspecified non-institutional (private) residence as the place of occurrence of the external cause: Y92.009

## 2021-03-19 LAB — CBC WITH DIFFERENTIAL/PLATELET
Abs Immature Granulocytes: 0.13 10*3/uL — ABNORMAL HIGH (ref 0.00–0.07)
Basophils Absolute: 0.1 10*3/uL (ref 0.0–0.1)
Basophils Relative: 1 %
Eosinophils Absolute: 0.3 10*3/uL (ref 0.0–0.5)
Eosinophils Relative: 2 %
HCT: 34.9 % — ABNORMAL LOW (ref 39.0–52.0)
Hemoglobin: 11.1 g/dL — ABNORMAL LOW (ref 13.0–17.0)
Immature Granulocytes: 1 %
Lymphocytes Relative: 14 %
Lymphs Abs: 2.1 10*3/uL (ref 0.7–4.0)
MCH: 28.4 pg (ref 26.0–34.0)
MCHC: 31.8 g/dL (ref 30.0–36.0)
MCV: 89.3 fL (ref 80.0–100.0)
Monocytes Absolute: 1.2 10*3/uL — ABNORMAL HIGH (ref 0.1–1.0)
Monocytes Relative: 8 %
Neutro Abs: 11.5 10*3/uL — ABNORMAL HIGH (ref 1.7–7.7)
Neutrophils Relative %: 74 %
Platelets: 440 10*3/uL — ABNORMAL HIGH (ref 150–400)
RBC: 3.91 MIL/uL — ABNORMAL LOW (ref 4.22–5.81)
RDW: 12.6 % (ref 11.5–15.5)
WBC: 15.3 10*3/uL — ABNORMAL HIGH (ref 4.0–10.5)
nRBC: 0 % (ref 0.0–0.2)

## 2021-03-19 LAB — COMPREHENSIVE METABOLIC PANEL
ALT: 22 U/L (ref 0–44)
AST: 25 U/L (ref 15–41)
Albumin: 2.9 g/dL — ABNORMAL LOW (ref 3.5–5.0)
Alkaline Phosphatase: 66 U/L (ref 38–126)
Anion gap: 11 (ref 5–15)
BUN: 25 mg/dL — ABNORMAL HIGH (ref 8–23)
CO2: 22 mmol/L (ref 22–32)
Calcium: 8.7 mg/dL — ABNORMAL LOW (ref 8.9–10.3)
Chloride: 107 mmol/L (ref 98–111)
Creatinine, Ser: 1.38 mg/dL — ABNORMAL HIGH (ref 0.61–1.24)
GFR, Estimated: 52 mL/min — ABNORMAL LOW (ref >60)
Glucose, Bld: 136 mg/dL — ABNORMAL HIGH (ref 70–99)
Potassium: 4.2 mmol/L (ref 3.5–5.1)
Sodium: 140 mmol/L (ref 135–145)
Total Bilirubin: 0.7 mg/dL (ref 0.3–1.2)
Total Protein: 5.9 g/dL — ABNORMAL LOW (ref 6.5–8.1)

## 2021-03-19 LAB — HEMOGLOBIN AND HEMATOCRIT, BLOOD
HCT: 34.1 % — ABNORMAL LOW (ref 39.0–52.0)
Hemoglobin: 10.8 g/dL — ABNORMAL LOW (ref 13.0–17.0)

## 2021-03-19 LAB — RESP PANEL BY RT-PCR (FLU A&B, COVID) ARPGX2
Influenza A by PCR: NEGATIVE
Influenza B by PCR: NEGATIVE
SARS Coronavirus 2 by RT PCR: NEGATIVE

## 2021-03-19 LAB — PROTIME-INR
INR: 1.2 (ref 0.8–1.2)
Prothrombin Time: 14.7 seconds (ref 11.4–15.2)

## 2021-03-19 LAB — TYPE AND SCREEN
ABO/RH(D): O POS
Antibody Screen: NEGATIVE

## 2021-03-19 LAB — LIPASE, BLOOD: Lipase: 35 U/L (ref 11–51)

## 2021-03-19 LAB — TROPONIN I (HIGH SENSITIVITY)
Troponin I (High Sensitivity): 12 ng/L (ref <18)
Troponin I (High Sensitivity): 14 ng/L (ref <18)

## 2021-03-19 LAB — ABO/RH: ABO/RH(D): O POS

## 2021-03-19 LAB — POC OCCULT BLOOD, ED: Fecal Occult Bld: POSITIVE — AB

## 2021-03-19 MED ORDER — CHLORHEXIDINE GLUCONATE CLOTH 2 % EX PADS
6.0000 | MEDICATED_PAD | Freq: Every day | CUTANEOUS | Status: DC
  Administered 2021-03-20 – 2021-03-25 (×6): 6 via TOPICAL

## 2021-03-19 MED ORDER — PANTOPRAZOLE 80MG IVPB - SIMPLE MED
80.0000 mg | Freq: Once | INTRAVENOUS | Status: AC
  Administered 2021-03-19: 80 mg via INTRAVENOUS
  Filled 2021-03-19: qty 80

## 2021-03-19 MED ORDER — ACETAMINOPHEN 650 MG RE SUPP: 650.0000 mg | Freq: Four times a day (QID) | RECTAL | Status: DC | PRN

## 2021-03-19 MED ORDER — PANTOPRAZOLE SODIUM 40 MG IV SOLR
40.0000 mg | Freq: Two times a day (BID) | INTRAVENOUS | Status: DC
  Administered 2021-03-20 – 2021-03-23 (×8): 40 mg via INTRAVENOUS
  Filled 2021-03-19 (×8): qty 40

## 2021-03-19 MED ORDER — ACETAMINOPHEN 325 MG PO TABS
650.0000 mg | ORAL_TABLET | Freq: Four times a day (QID) | ORAL | Status: DC | PRN
  Administered 2021-03-19 – 2021-03-23 (×4): 650 mg via ORAL
  Filled 2021-03-19 (×4): qty 2

## 2021-03-19 MED ORDER — PANTOPRAZOLE SODIUM 40 MG IV SOLR: 40.0000 mg | Freq: Two times a day (BID) | INTRAVENOUS | Status: DC

## 2021-03-19 MED ORDER — SODIUM CHLORIDE 0.9 % IV BOLUS
1000.0000 mL | Freq: Once | INTRAVENOUS | Status: AC
  Administered 2021-03-19: 1000 mL via INTRAVENOUS

## 2021-03-19 MED ORDER — ALBUTEROL SULFATE (2.5 MG/3ML) 0.083% IN NEBU: 2.5000 mg | INHALATION_SOLUTION | Freq: Four times a day (QID) | RESPIRATORY_TRACT | Status: DC | PRN

## 2021-03-19 MED ORDER — SODIUM CHLORIDE 0.9% FLUSH
3.0000 mL | Freq: Two times a day (BID) | INTRAVENOUS | Status: DC
  Administered 2021-03-19 – 2021-03-25 (×11): 3 mL via INTRAVENOUS

## 2021-03-19 MED ORDER — ONDANSETRON HCL 4 MG/2ML IJ SOLN
4.0000 mg | Freq: Once | INTRAMUSCULAR | Status: AC
Start: 2021-03-19 — End: 2021-03-19
  Administered 2021-03-19: 4 mg via INTRAVENOUS

## 2021-03-19 MED ORDER — ONDANSETRON HCL 4 MG/2ML IJ SOLN: 4.0000 mg | Freq: Four times a day (QID) | INTRAMUSCULAR | Status: DC | PRN

## 2021-03-19 MED ORDER — PANTOPRAZOLE INFUSION (NEW) - SIMPLE MED
8.0000 mg/h | INTRAVENOUS | Status: DC
  Administered 2021-03-19: 8 mg/h via INTRAVENOUS
  Filled 2021-03-19: qty 80
  Filled 2021-03-19: qty 100

## 2021-03-19 MED ORDER — ONDANSETRON HCL 4 MG PO TABS: 4.0000 mg | ORAL_TABLET | Freq: Four times a day (QID) | ORAL | Status: DC | PRN

## 2021-03-19 MED ORDER — NETARSUDIL DIMESYLATE 0.02 % OP SOLN: 1.0000 [drp] | Freq: Every day | OPHTHALMIC | Status: DC

## 2021-03-19 MED ORDER — PRAVASTATIN SODIUM 10 MG PO TABS
20.0000 mg | ORAL_TABLET | Freq: Every day | ORAL | Status: DC
  Administered 2021-03-20 – 2021-03-25 (×6): 20 mg via ORAL
  Filled 2021-03-19 (×6): qty 2

## 2021-03-19 MED ORDER — ESCITALOPRAM OXALATE 20 MG PO TABS
20.0000 mg | ORAL_TABLET | Freq: Every day | ORAL | Status: DC
  Administered 2021-03-19 – 2021-03-25 (×7): 20 mg via ORAL
  Filled 2021-03-19 (×2): qty 1
  Filled 2021-03-19: qty 2
  Filled 2021-03-19 (×4): qty 1

## 2021-03-19 MED ORDER — ONDANSETRON HCL 4 MG/2ML IJ SOLN
INTRAMUSCULAR | Status: AC
  Filled 2021-03-19: qty 2

## 2021-03-19 MED ORDER — IOHEXOL 350 MG/ML SOLN
100.0000 mL | Freq: Once | INTRAVENOUS | Status: AC | PRN
  Administered 2021-03-19: 100 mL via INTRAVENOUS

## 2021-03-19 MED ORDER — SODIUM CHLORIDE 0.9 % IV SOLN: INTRAVENOUS | Status: DC

## 2021-03-19 MED ORDER — LATANOPROST 0.005 % OP SOLN
1.0000 [drp] | Freq: Every day | OPHTHALMIC | Status: DC
  Administered 2021-03-19 – 2021-03-24 (×6): 1 [drp] via OPHTHALMIC
  Filled 2021-03-19 (×2): qty 2.5

## 2021-03-19 NOTE — ED Provider Notes (Signed)
Patient w/ hx of dementia here from [facility] with syncope vs near-syncope, emesis this morning, fall with head injury on Plavix and aspirin.  Reportedly had hypotension on scene by EMS, but BP normal here.  CTH and C-spine without acute findings.  Abdominal exam mildly distended.  Pending CT abdomen  Noted to have melena on stool exam, moderate amount half the bed-pan Hgb 11.1 here, down from 14-15 baseline this year  Received protonix here  850 am -no acute source of bleeding noted on the CT angiogram ordered.  On my reassessment the patient's vital signs have remained stable.  However given the size of his bowel movement, which is grossly apparent melanotic, and his concern for syncope, I do think he would require hospitalization and GI consultation for suspected GI bleed.  I subsequently spoke to his daughter by phone.  She reports that the patient did have a very sizable bloody bowel movement yesterday in the doctor's office as well.  She has been very concerned about this.  This may be related to the patient's initiation of aspirin and Plavix after his TIA, approximately 2 weeks ago in the hospital.  It is unclear whether there may be an upper or lower GI bleed.  His daughter is unaware if the patient has been seen by gastroenterologist in the past, although I do not see any records in our system.  The patient has repeatedly been asking for water, I do think is reasonable provide him with water and ice chips at this time.  He is started on a clear liquid diet.  GI consult placed, as well as consult to the hospitalist for readmission.  The patient has a sign DNR form at the bedside  Daughter by phone requests voiding trial and possible catheter removal, which was placed on his last hospitalization for urinary retention.  She states he has not had urinary difficulties in the past.  Patient was given fosfomycin empirically on last hospitalization based on UA results (no urine culture sent per  records).  9 am -- admitted to Dr Tamala Julian, hospitalist, care discussed Jud GI consulted - okay for clear liquid diet today, no emergent plan for scope given recent plavex (yesterday), but perhaps in upcoming days    Wyvonnia Dusky, MD 03/19/21 [phone]

## 2021-03-19 NOTE — Consult Note (Signed)
Consultation  Referring Provider:     Dr. Octaviano Glow Primary Care Physician:  [name], MD Primary Gastroenterologist:       unassigned  Reason for Consultation:     GI bleed in setting of ASA/PLAVIX         HPI:   Matthew Knight is a 81 y.o. male with history of dementia at [facility], history of type 2 diabetes, hearing loss, hepatitis, fracture of the distal left radius and ulna secondary to fall on 03/04/2021, orthostatic hypotension, TIA admissions 01/01-01/04 started on Plavix and aspirin at that visit with plan of 3 weeks of Plavix and stopping baby aspirin only, last dose of Plavix yesterday morning, discharged to SNF, had acute urinary retention there, presents for syncopal episode/GI bleed.  Patient with dementia, primary history comes from chart, patient lying comfortably in room, requesting water. Oriented to self, place, not aware as to why he is here and not oriented to time.  Called and talked with Seth Bake, patient's daughter, lost her mom primary caregiver December 13 of this year. No history of GI bleeds, yesterday at orthopedic surgeon had 1 large bright red bloody bowel movement, discussed with skilled nursing facility.  Patient then had fall out of bed last evening. Patient reports falling out of bed, did hit his head, unknown LOC.  CT head neck without acute findings. Then in the ER patient had large melanic stools.   Patient has had some nausea vomiting. Per notes patient is on omeprazole 20 mg outpatient.  Patient brought in from SNF with syncope versus near syncopal episode after emesis this morning, fall with head injury.  Abdomen slightly distended.  CTA abdomen in the ER negative for acute findings. Given 1 L fluids and started on Protonix IV twice daily.  ED course: BUN 25 (BUN 20 on 03/08/2021), creatinine 1.38 (03/08/2021 creatinine 1)  Albumin 2.9, white blood cell count 15.3, hemoglobin 11.1 compared to 1/02 was 13.8.   Fecal occult  blood positive  Previous GI work up: CTA 03/19/21 1. No acute findings. No significant proximal mesenteric arterial occlusive disease. 2. 1.6 cm nonspecific right adrenal nodule, possible pheochromocytoma versus adenoma. Recommend biochemical lab evaluation for pheochromocytoma. If lab values normal, 1 year follow-up adrenal washout CT. References: JACR 2017 Aug; 14(8):1038-44, JCAT 2016 Mar-Apr; 40(2):194-200. 3. Aortoiliac  atherosclerosis (ICD10-170.0). 4. 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 Family History:  His family history includes Diabetes in his mother; Heart disease in his father; Memory loss in his paternal grandfather. Social History:   reports that he has been smoking pipe. He has never used smokeless tobacco. He reports that he does not drink alcohol and does not use drugs.  Prior to Admission medications   Medication Sig Start Date End Date Taking? Authorizing Provider  acetaminophen (TYLENOL) 325 MG tablet Take 2 tablets (650 mg total) by mouth every 4 (four) hours as needed for mild pain, moderate pain, fever or headache. 03/11/21  Yes [name], MD  aspirin EC 81 MG tablet Take 81 mg by mouth daily.   Yes [provider]  clopidogrel (PLAVIX) 75 MG tablet Take  1 tablet (75 mg total) by mouth daily for 21 days. 03/11/21 04/01/21 Yes [name], MD  escitalopram (LEXAPRO) 20 MG tablet Take 20 mg by mouth daily. 07/11/17  Yes [provider]  isosorbide mononitrate (IMDUR) 30 MG 24 hr tablet Take 30 mg by mouth daily. 04/28/20  Yes [provider]  latanoprost (XALATAN) 0.005 % ophthalmic solution Place 1 drop into both eyes at bedtime.   07/27/17  Yes [provider]  lisinopril (ZESTRIL) 40 MG tablet Take 40 mg by mouth daily. 02/01/20  Yes [provider]  meclizine (ANTIVERT) 25 MG tablet Take 25 mg by mouth 3 (three) times daily as needed for dizziness.   Yes [provider]  omeprazole (PRILOSEC) 20 MG capsule Take 20 mg by mouth daily. 04/07/20  Yes [provider]  pravastatin (PRAVACHOL) 20 MG tablet Take 20 mg by mouth daily. 04/28/20  Yes [provider]  RHOPRESSA 0.02 % SOLN Place 1 drop into both eyes daily at 6 (six) AM. 11/15/18  Yes [provider]  rivastigmine (EXELON) 4.6 mg/24hr Place 4.6 mg onto the skin daily. 12/12/19  Yes [provider]    Current Facility-Administered Medications  Medication Dose Route Frequency Provider Last Rate Last Admin   0.9 %  sodium chloride infusion   Intravenous Continuous Smith, Rondell A, MD       acetaminophen (TYLENOL) tablet 650 mg  650 mg Oral Q6H PRN Norval Morton, MD       Or   acetaminophen (TYLENOL) suppository 650 mg  650 mg Rectal Q6H PRN Fuller Plan A, MD       albuterol (PROVENTIL) (2.5 MG/3ML) 0.083% nebulizer solution 2.5 mg  2.5 mg Nebulization Q6H PRN Tamala Julian, Rondell A, MD       escitalopram (LEXAPRO) tablet 20 mg  20 mg Oral Daily Smith, Rondell A, MD       ondansetron (ZOFRAN) tablet 4 mg  4 mg Oral Q6H PRN Fuller Plan A, MD       Or   ondansetron (ZOFRAN) injection 4 mg  4 mg Intravenous Q6H PRN Norval Morton, MD       [START ON 03/22/2021] pantoprazole (PROTONIX) injection 40 mg  40 mg Intravenous Q12H Smith, Rondell A, MD       pantoprozole (PROTONIX) 80 mg /NS 100 mL infusion  8 mg/hr Intravenous Continuous Fuller Plan A, MD 10 mL/hr at 03/19/21 0848 8 mg/hr at 03/19/21 0848   pravastatin (PRAVACHOL) tablet 20 mg  20 mg Oral Daily Smith, Rondell A, MD       sodium chloride flush (NS) 0.9 % injection 3 mL  3 mL Intravenous Q12H Smith, Rondell A, MD       Current Outpatient  Medications  Medication Sig Dispense Refill   acetaminophen (TYLENOL) 325 MG tablet Take 2 tablets (650 mg total) by mouth every 4 (four) hours as needed for mild pain, moderate pain, fever or headache.     aspirin EC 81 MG tablet Take 81 mg by mouth daily.     clopidogrel (PLAVIX) 75 MG tablet Take 1 tablet (75 mg total) by mouth daily for 21 days. 21 tablet 0   escitalopram (LEXAPRO) 20 MG tablet Take 20 mg by mouth daily.     isosorbide mononitrate (IMDUR) 30 MG 24 hr tablet Take 30 mg by mouth daily.     latanoprost (XALATAN) 0.005 % ophthalmic solution Place 1 drop into both eyes at bedtime.      lisinopril (  ZESTRIL) 40 MG tablet Take 40 mg by mouth daily.     meclizine (ANTIVERT) 25 MG tablet Take 25 mg by mouth 3 (three) times daily as needed for dizziness.     omeprazole (PRILOSEC) 20 MG capsule Take 20 mg by mouth daily.     pravastatin (PRAVACHOL) 20 MG tablet Take 20 mg by mouth daily.     RHOPRESSA 0.02 % SOLN Place 1 drop into both eyes daily at 6 (six) AM.     rivastigmine (EXELON) 4.6 mg/24hr Place 4.6 mg onto the skin daily.      Allergies as of 03/19/2021 - Review Complete 03/19/2021  Allergen Reaction Noted   Darunavir Other (See Comments) 09/19/2017   Sulfa antibiotics  04/23/2015    Review of Systems:    Patient with dementia, denies blood in the stool, unaware why he is here.    Physical Exam:  Vital signs in last 24 hours: Temp:  [98 F (36.7 C)] 98 F (36.7 C) (01/12 0540) Pulse Rate:  [60-108] 82 (01/12 0900) Resp:  [15-22] 21 (01/12 0900) BP: (100-130)/(44-67) 116/60 (01/12 0900) SpO2:  [95 %-100 %] 99 % (01/12 0900) Weight:  [99.8 kg] 99.8 kg (01/12 0615)    General:   Pleasant, white male in no acute distress Head:  Normocephalic and atraumatic. Eyes: sclerae anicteric,conjunctive erythema  Heart:  regular rate and rhythm, no murmurs or gallops Pulm: Clear anteriorly; no wheezing Abdomen:  Soft, Obese AB, skin exam normal, Normal bowel sounds.  no   tenderness Without guarding and Without rebound, without hepatomegaly. Extremities:  Without edema. Msk:  Symmetrical without gross deformities. Peripheral pulses intact.  Neurologic:  Alert and  oriented x 2;  grossly normal neurologically. Skin:   Dry and intact without significant lesions or rashes. Psychiatric: Very pleasant, no abnormal affect or behaviors.  LAB RESULTS: Recent Labs    03/19/21 0549  WBC 15.3*  HGB 11.1*  HCT 34.9*  PLT 440*   BMET Recent Labs    03/19/21 0549  NA 140  K 4.2  CL 107  CO2 22  GLUCOSE 136*  BUN 25*  CREATININE 1.38*  CALCIUM 8.7*   LFT Recent Labs    03/19/21 0549  PROT 5.9*  ALBUMIN 2.9*  AST 25  ALT 22  ALKPHOS 66  BILITOT 0.7   PT/INR Recent Labs    03/19/21 0754  LABPROT 14.7  INR 1.2    STUDIES: CT Head Wo Contrast  Result Date: 03/19/2021 CLINICAL DATA:  Fall injury on blood thinners. EXAM: CT HEAD WITHOUT CONTRAST CT CERVICAL SPINE WITHOUT CONTRAST TECHNIQUE: Multidetector CT imaging of the head and cervical spine was performed following the standard protocol without intravenous contrast. Multiplanar CT image reconstructions of the cervical spine were also generated. RADIATION DOSE REDUCTION: This exam was performed according to the departmental dose-optimization program which includes automated exposure control, adjustment of the mA and/or kV according to patient size and/or use of iterative reconstruction technique. COMPARISON:  Head CT 03/08/2021, cervical spine CT 11/10/2017 FINDINGS: CT HEAD FINDINGS Brain: There is moderately developed atrophy with atrophic ventriculomegaly and relatively mild small vessel disease in the cerebral deep white matter. There is no midline shift. The cerebellum and brainstem are not well seen due to metallic artifact from dental fillings but no obvious focal abnormality is evident. There is no focal asymmetry worrisome for acute infarct, hemorrhage or mass. There are scattered  calcifications in the thalami. Vascular: There calcifications in the carotid siphons but no hyperdense central vessels.  Skull: Normal. Negative for fracture or focal lesion. Sinuses/Orbits: Unremarkable orbits and orbital contents. S-shaped nasal septum. Chronic left maxillary sinus opacification and hyperostosis with protrusion of the process into the left nasal passage and calcifications in the consolidated tissue. There is patchy membrane thickening in the ethmoids. Other sinuses and the bilateral mastoid air cells are clear. Other: None. CT CERVICAL SPINE FINDINGS Alignment: Straightened lordosis is again noted with slight degenerative grade 1 anterolisthesis again at C4-5 and C5-6, stable. Skull base and vertebrae: No fracture or suspicious bone lesion. Degenerative cystic changes in the dens are chronically noted, scattered endplate Schmorl's nodes. There is osteopenia. Narrowing and moderate spurring anterior atlantodental joint. Soft tissues and spinal canal: No prevertebral fluid or swelling. No visible canal hematoma. Disc levels: The cervical discs are diffusely degenerated except for C2-3 which maintains a normal height. Bidirectional osteophytes encroach on the cord surface at multiple levels compressing the ventral cord and causing moderate spinal canal stenosis at C3-4, C4-5, C5-6 and to a lesser extent C6-7. There is facet joint and uncinate hypertrophy throughout with multilevel foraminal stenosis most significantly at C3-4 and C4-5, and on the left at C7-T1. This was seen previously. Upper chest: There is a chronic small ground-glass infiltrate in the posterior left apex, stable. Other: Chronic nuchal ligament calcifications, C6 level. IMPRESSION: 1. No acute intracranial CT findings, depressed skull fractures, or interval changes. 2. Chronic left maxillary sinusitis. 3. Osteopenia and degenerative change of the cervical spine without evidence of fractures or interval changes. Electronically Signed    By: Telford Nab M.D.   On: 03/19/2021 06:38   CT Cervical Spine Wo Contrast  Result Date: 03/19/2021 CLINICAL DATA:  Fall injury on blood thinners. EXAM: CT HEAD WITHOUT CONTRAST CT CERVICAL SPINE WITHOUT CONTRAST TECHNIQUE: Multidetector CT imaging of the head and cervical spine was performed following the standard protocol without intravenous contrast. Multiplanar CT image reconstructions of the cervical spine were also generated. RADIATION DOSE REDUCTION: This exam was performed according to the departmental dose-optimization program which includes automated exposure control, adjustment of the mA and/or kV according to patient size and/or use of iterative reconstruction technique. COMPARISON:  Head CT 03/08/2021, cervical spine CT 11/10/2017 FINDINGS: CT HEAD FINDINGS Brain: There is moderately developed atrophy with atrophic ventriculomegaly and relatively mild small vessel disease in the cerebral deep white matter. There is no midline shift. The cerebellum and brainstem are not well seen due to metallic artifact from dental fillings but no obvious focal abnormality is evident. There is no focal asymmetry worrisome for acute infarct, hemorrhage or mass. There are scattered calcifications in the thalami. Vascular: There calcifications in the carotid siphons but no hyperdense central vessels. Skull: Normal. Negative for fracture or focal lesion. Sinuses/Orbits: Unremarkable orbits and orbital contents. S-shaped nasal septum. Chronic left maxillary sinus opacification and hyperostosis with protrusion of the process into the left nasal passage and calcifications in the consolidated tissue. There is patchy membrane thickening in the ethmoids. Other sinuses and the bilateral mastoid air cells are clear. Other: None. CT CERVICAL SPINE FINDINGS Alignment: Straightened lordosis is again noted with slight degenerative grade 1 anterolisthesis again at C4-5 and C5-6, stable. Skull base and vertebrae: No fracture or  suspicious bone lesion. Degenerative cystic changes in the dens are chronically noted, scattered endplate Schmorl's nodes. There is osteopenia. Narrowing and moderate spurring anterior atlantodental joint. Soft tissues and spinal canal: No prevertebral fluid or swelling. No visible canal hematoma. Disc levels: The cervical discs are diffusely degenerated except for C2-3  which maintains a normal height. Bidirectional osteophytes encroach on the cord surface at multiple levels compressing the ventral cord and causing moderate spinal canal stenosis at C3-4, C4-5, C5-6 and to a lesser extent C6-7. There is facet joint and uncinate hypertrophy throughout with multilevel foraminal stenosis most significantly at C3-4 and C4-5, and on the left at C7-T1. This was seen previously. Upper chest: There is a chronic small ground-glass infiltrate in the posterior left apex, stable. Other: Chronic nuchal ligament calcifications, C6 level. IMPRESSION: 1. No acute intracranial CT findings, depressed skull fractures, or interval changes. 2. Chronic left maxillary sinusitis. 3. Osteopenia and degenerative change of the cervical spine without evidence of fractures or interval changes. Electronically Signed   By: Telford Nab M.D.   On: 03/19/2021 06:38   DG Pelvis Portable  Result Date: 03/19/2021 CLINICAL DATA:  81 year old male with history of trauma from a fall. EXAM: PORTABLE PELVIS 1-2 VIEWS COMPARISON:  No priors. FINDINGS: There is no evidence of pelvic fracture or diastasis. No pelvic bone lesions are seen. IMPRESSION: Negative. Electronically Signed   By: Vinnie Langton M.D.   On: 03/19/2021 06:12   DG Chest Portable 1 View  Result Date: 03/19/2021 CLINICAL DATA:  81 year old male with history of trauma from a fall. EXAM: PORTABLE CHEST 1 VIEW COMPARISON:  Chest x-ray 11/11/2017. FINDINGS: Calcified pleural plaques are noted in the thorax bilaterally, indicative of asbestos related pleural disease. Regional areas of  architectural distortion are noted in the lungs bilaterally, likely reflective of some associated fibrosis, potentially asbestosis, similar to the prior examination allowing for differences in patient positioning and inspiration. No definite acute consolidative airspace disease. No pleural effusions. No pneumothorax. No evidence of pulmonary edema. Heart size is normal. Upper mediastinal contours are within normal limits. Atherosclerotic calcifications are noted in the thoracic aorta. IMPRESSION: 1. No definite radiographic evidence of significant acute traumatic injury to the thorax. 2. 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 These findings could be better evaluated with follow-up nonemergent high-resolution chest CT if clinically appropriate. 3. Aortic atherosclerosis. Electronically Signed   By: Vinnie Langton M.D.   On: 03/19/2021 06:13   CT ANGIO GI BLEED  Result Date: 03/19/2021 CLINICAL DATA:  Acute mesenteric ischemia, abdominal pain EXAM: CTA ABDOMEN AND PELVIS WITHOUT AND WITH CONTRAST TECHNIQUE: Multidetector CT imaging of the abdomen and pelvis was performed using the standard protocol during bolus administration of intravenous contrast. Multiplanar reconstructed images and MIPs were obtained and reviewed to evaluate the vascular anatomy. CONTRAST:  170mL OMNIPAQUE IOHEXOL 350 MG/ML SOLN COMPARISON:  10/22/2004 FINDINGS: VASCULAR Aorta: Moderate partially calcified atheromatous plaque particularly in the infrarenal segment. No aneurysm, dissection, or stenosis. Celiac: Calcified ostial plaque with a mild smooth stenosis, widely patent distally. SMA: Patent without evidence of aneurysm, dissection, vasculitis or significant stenosis. Renals: Duplicated left, inferior dominant, both patent. Single right, widely patent. IMA: Patent without evidence of aneurysm, dissection, vasculitis or significant stenosis. Inflow: Mild bilateral common iliac and external  iliac partially calcified plaque, without aneurysm, dissection, or stenosis. Proximal Outflow: Bilateral common femoral and visualized portions of the superficial and profunda femoral arteries are patent without evidence of aneurysm, dissection, vasculitis or significant stenosis. Veins: No obvious venous abnormality within the limitations of this arterial phase study. Review of the MIP images confirms the above findings. NON-VASCULAR Lower chest: No pleural or pericardial effusion. Partially calcified bilateral pleural plaques. Hepatobiliary: No focal liver abnormality is seen. No gallstones, gallbladder wall thickening, or biliary dilatation. Pancreas: Unremarkable.  No pancreatic ductal dilatation or surrounding inflammatory changes. Spleen: Normal in size without focal abnormality. Adrenals/Urinary Tract: Mildly heterogenous 1.6 cm right adrenal nodule measuring 92 Hounsfield units, new since previous. No renal mass or hydronephrosis. Urinary bladder is decompressed by Foley catheter. Stomach/Bowel: Stomach physiologically distended by fluid. The small bowel is decompressed, unremarkable. The colon is nondilated, unremarkable. Lymphatic: Subcentimeter left para-aortic and bilateral common iliac lymph nodes. No mesenteric adenopathy. Reproductive: Prostate enlargement Other: Bilateral pelvic phleboliths.  No ascites.  No free air. Musculoskeletal: No fracture or worrisome bone lesion. Degenerative disc disease L4-5. IMPRESSION: 1. No acute findings. No significant proximal mesenteric arterial occlusive disease. 2. 1.6 cm nonspecific right adrenal nodule, possible pheochromocytoma versus adenoma. Recommend biochemical lab evaluation for pheochromocytoma. If lab values normal, 1 year follow-up adrenal washout CT. References: JACR 2017 Aug; 14(8):1038-44, JCAT 2016 Mar-Apr; 40(2):194-200. 3. Aortoiliac  atherosclerosis (ICD10-170.0). 4. Bilateral partially calcified pleural plaques suggesting previous asbestos  exposure Electronically Signed   By: Lucrezia Europe M.D.   On: 03/19/2021 08:30    ,  Impression    GI bleed in setting of aspirin Plavix, last dose 03/18/2021, without hemodynamic compromise Appears to have some bright red blood as well as large colonic stool here in the ER. CTA negative for diverticulosis, source of bleeding. HGB 11.1 MCV 89.3 Platelets 440 BUN 25 Cr 1.38  Dementia Will need to discuss with daughter.   Syncopal/presyncopal episode Known history of orthostatic hypotension CT head neck without acute issues.    Plan   -Protonix 40 mg IV BID. -Clear liquid diet, n.p.o. at midnight --Continue to monitor H&H with transfusion as needed to maintain hemoglobin greater than 7.5 -Plan for EGD possible colonoscopy tomorrow following day, patient had last dose of Plavix yesterday only like 5-day washout, potentially wait 2 to 3 days, will discuss further with Dr. Hilarie Fredrickson about timing.  -Could potentially have EGD with colonoscopy or with melanic stools could start with EGD only if possible follow-up colonoscopy, injury her daughter will be here on 130 today, will try to discuss further with her.  I thoroughly discussed the procedure to include nature, alternatives, benefits, and risks including but not limited to bleeding, perforation, infection, anesthesia/cardiac and pulmonary complications. Patient provides understanding and gave verbal consent to proceed. -Please obtain early AM CBC, LFTs, INR, Lactate   Thank you for your kind consultation, we will continue to follow.  Vladimir Crofts  03/19/2021, 10:21 AM

## 2021-03-19 NOTE — ED Notes (Addendum)
Trauma Response Nurse Documentation   Matthew Knight is a 81 y.o. male arriving to [facility] ED via EMS from SNF  On clopidogrel 75 mg daily. Trauma was activated as a Level 2 by ED charge RN based on the following trauma criteria Elderly patients > 65 with head trauma on anti-coagulation (excluding ASA). Trauma team at the bedside on patient arrival. Patient cleared for CT by Dr. Laverta Baltimore EDP. Patient to CT with team. GCS 14, which is baseline for patient.  History   Past Medical History:  Diagnosis Date   Diabetes mellitus without complication (HCC)    Hearing loss    Hepatitis    Small vessel disease ([place])      Past Surgical History:  Procedure Laterality Date   APPENDECTOMY         Initial Focused Assessment (If applicable, or please see trauma documentation): Alert but confused at baseline male presents via EMS, no c-collar in place. No obvious trauma noted, chronic foley with fowl odor.  CT's Completed:   CT Head and CT C-Spine   Interventions:  Trauma lab draw, EKG, bladder scan, portable chest and pelvis XRAY, CT head and c-spine  Plan for disposition:  Pending imaging and medical workup  Consults completed:  none at the time of this note. Anticipate medical workup for syncopal/possible seizure activity  Event Summary: Patient arrives via EMS from [facility] for an unwitnessed fall, found down near his bed unresponsive per facility. Presumed by facility to be attempting to get out of bed - usually requires assistance with this ADL. No obvious trauma noted on exam, patient tender on abdominal palpation and states he has "to go potty." Distended abd, soft to palpation. Per EMS, was alert on their arrival to scene and supine. Upon sitting up, patient became rigid and began to tremor, then had syncopal episode. Vomiting at that time. No history of seizure like activity. Orthostatic with EMS. IV established in the field. Vomiting after initial trauma assessment, 4MG   zofran given IV. Escorted to CT, pending results at this time.  Chronic foley, per EMS report last changed today. Odor noted to foley, dry/crusty drainage to tubing. Frequent falls per EMS report, patient has velcro splint to left wrist for reported fracture.   MTP Summary (If applicable): NA  Bedside handoff with ED RN Matthew Knight.    Matthew Knight  Trauma Response RN  Please call TRN at [phone] for further assistance.

## 2021-03-19 NOTE — ED Triage Notes (Signed)
BIB [facility] from [facility] after level 2 fall out of bed, on plavix. Per staff pt was unresponsive. EMS believes pt may have had seizure like activity and an episode of vomiting. Pt has orthostatic hypotension.

## 2021-03-19 NOTE — ED Notes (Signed)
Provider at bedside

## 2021-03-19 NOTE — H&P (Addendum)
History and Physical    Matthew Knight GMW:[id number] DOB: [birth year] DOA: 03/19/2021  Referring MD/NP/PA: Octaviano Glow, MD PCP: [name], MD  Patient coming from: [facility] via EMS  Chief Complaint: Fall  I have personally briefly reviewed patient's old medical records in [place]   HPI: Matthew Knight is a 81 y.o. male with medical history significant of dementia, orthostatic hypotension, diabetes mellitus type 2, hepatitis, and fracture of the distal left radius and ulna secondary to fall on 03/04/2021.  He presents to the hospital after reportedly falling out of bed and per staff was noted to be unresponsive.   History is limited from the patient due to dementia and obtained initially from review of records.  EMS reported possible seizure-like activity with an episode of vomiting.  Patient had just recently been admitted in the hospital from 1/1-1/4 after presenting with left-sided weakness with reports of dizziness and a fall at home.  Evaluated by the neurology with CT, MRI of the brain, MRA of the head, carotid ultrasound, and echocardiogram noted to be relatively unremarkable.  Patient was started on Plavix and aspirin at that time.  He was evaluated by PT who recommended CIR, but was not a candidate due to insurance patient ultimately was not felt safe to be discharged home and was sent to SNF.  During his stay he also noted that have acute urinary retention for which Foley catheter had been inserted on 1/4 and he has been given a dose of fosfomycin as urinalysis showed bacteria with WBCs.    His daughter is present at bedside and reports that yesterday he had an appointment with orthopedics in regards to his distal left radius and ulnar fracture.  During that appointment patient had a large bowel movement where his daughter noted blood present.  Anytime he has a bowel movement he complains of lower abdominal pain and complains of pain in his left arm.  His  daughter asked about his Foley catheter and states that he has a appointment with urology in the coming weeks.  Furthermore she notes that he had been started rivastigmine patch by his neurologist at [facility], but he normally sees them only once a year.  His daughter is unaware if he had ever had a colonoscopy before as he was cared for by his wife, but she passed away on 2021/03/12.   ED Course: Upon admission to the emergency department patient was seen as a level 2 trauma being on Plavix and aspirin with report of head injury.  Vitals noted patient to be afebrile, pulse 6108, respirations 15-22, blood pressures relatively maintained, and O2 saturation maintained on room air.  Labs significant for WBC 15.3, hemoglobin 11.1, platelets 440, BUN 25, creatinine 1.38, calcium 8.7, and high-sensitivity troponin 12.  Chest x-ray noted no definitive acute traumatic injury and asbestos-related pleural disease.  X-rays of the pelvis did not note any acute abnormality.  CT scan of the head and cervical spine are negative for any acute intracranial or cervical fractures.  Patient had been given 1 L normal saline IV fluids, Zofran, and started on a Protonix drip.   GI have been consulted with recommendations of holding Plavix and to keep patient on clear liquid diet.   Review of Systems  Unable to perform ROS: Dementia  Gastrointestinal:  Positive for abdominal pain and blood in stool.  Musculoskeletal:  Positive for falls and joint pain.  Psychiatric/Behavioral:  Positive for memory loss.  Past Medical History:  Diagnosis Date   Dementia ([place])    Diabetes mellitus without complication ([place])    Hearing loss    Hepatitis    Small vessel disease ([place])    Stroke [facility])     Past Surgical History:  Procedure Laterality Date   APPENDECTOMY       reports that he has been smoking pipe. He has never used smokeless tobacco. He reports that he does not drink alcohol and does not use  drugs.  Allergies  Allergen Reactions   Darunavir Other (See Comments)    Other reaction(s): Eruption of skin   Sulfa Antibiotics     Other reaction(s): Other (See Comments) States made him crazy    Family History  Problem Relation Age of Onset   Diabetes Mother    Heart disease Father    Memory loss Paternal Grandfather     Prior to Admission medications   Medication Sig Start Date End Date Taking? Authorizing Provider  acetaminophen (TYLENOL) 325 MG tablet Take 2 tablets (650 mg total) by mouth every 4 (four) hours as needed for mild pain, moderate pain, fever or headache. 03/11/21  Yes [name], MD  aspirin EC 81 MG tablet Take 81 mg by mouth daily.   Yes [provider]  clopidogrel (PLAVIX) 75 MG tablet Take 1 tablet (75 mg total) by mouth daily for 21 days. 03/11/21 04/01/21 Yes [name], MD  escitalopram (LEXAPRO) 20 MG tablet Take 20 mg by mouth daily. 07/11/17  Yes [provider]  isosorbide mononitrate (IMDUR) 30 MG 24 hr tablet Take 30 mg by mouth daily. 04/28/20  Yes [provider]  latanoprost (XALATAN) 0.005 % ophthalmic solution Place 1 drop into both eyes at bedtime.  07/27/17  Yes [provider]  lisinopril (ZESTRIL) 40 MG tablet Take 40 mg by mouth daily. 02/01/20  Yes [provider]  meclizine (ANTIVERT) 25 MG tablet Take 25 mg by mouth 3 (three) times daily as needed for dizziness.   Yes [provider]  omeprazole (PRILOSEC) 20 MG capsule Take 20 mg by mouth daily. 04/07/20  Yes [provider]  pravastatin (PRAVACHOL) 20 MG tablet Take 20 mg by mouth daily. 04/28/20  Yes [provider]  RHOPRESSA 0.02 % SOLN Place 1 drop into both eyes daily at 6 (six) AM. 11/15/18  Yes [provider]  rivastigmine (EXELON) 4.6 mg/24hr Place 4.6 mg onto the skin daily. 12/12/19  Yes [provider]    Physical Exam:  Constitutional: Elderly male who appears to be in no acute distress  at this time. Vitals:   03/19/21 0615 03/19/21 0630 03/19/21 0645 03/19/21 0700  BP:  112/67 (!) 124/56 112/60  Pulse:  87 60 89  Resp:  20 15 15   Temp:      TempSrc:      SpO2:  96% 97% 100%  Weight: 99.8 kg     Height: 6' (1.829 m)      Eyes: PERRL, lids and conjunctivae normal ENMT: Mucous membranes are dry. Posterior pharynx clear of any exudate or lesions.  Neck: normal, supple, no masses, no thyromegaly Respiratory: clear to auscultation bilaterally, no wheezing, no crackles. Normal respiratory effort. No accessory muscle use.  Cardiovascular: Regular rate and rhythm, no murmurs / rubs / gallops. No extremity edema.  Abdomen: no tenderness, no masses palpated.  Bowel sounds positive.  Patient had a bloody bowel movement while in the room. Musculoskeletal: no clubbing / cyanosis.  Left  wrist in brace. Skin: no rashes, lesions, ulcers. No induration Neurologic: CN 2-12 grossly intact.  Able to move all extremities. Psychiatric: Poor recent memory.  Alert and oriented to person.      Labs on Admission: I have personally reviewed following labs and imaging studies  CBC: Recent Labs  Lab 03/19/21 0549  WBC 15.3*  NEUTROABS 11.5*  HGB 11.1*  HCT 34.9*  MCV 89.3  PLT 671*   Basic Metabolic Panel: Recent Labs  Lab 03/19/21 0549  NA 140  K 4.2  CL 107  CO2 22  GLUCOSE 136*  BUN 25*  CREATININE 1.38*  CALCIUM 8.7*   GFR: Estimated Creatinine Clearance: 52.2 mL/min (A) (by C-G formula based on SCr of 1.38 mg/dL (H)). Liver Function Tests: Recent Labs  Lab 03/19/21 0549  AST 25  ALT 22  ALKPHOS 66  BILITOT 0.7  PROT 5.9*  ALBUMIN 2.9*   Recent Labs  Lab 03/19/21 0549  LIPASE 35   No results for input(s): AMMONIA in the last 168 hours. Coagulation Profile: Recent Labs  Lab 03/19/21 0754  INR 1.2   Cardiac Enzymes: No results for input(s): CKTOTAL, CKMB, CKMBINDEX, TROPONINI in the last 168 hours. BNP (last 3 results) No results for input(s):  PROBNP in the last 8760 hours. HbA1C: No results for input(s): HGBA1C in the last 72 hours. CBG: No results for input(s): GLUCAP in the last 168 hours. Lipid Profile: No results for input(s): CHOL, HDL, LDLCALC, TRIG, CHOLHDL, LDLDIRECT in the last 72 hours. Thyroid Function Tests: No results for input(s): TSH, T4TOTAL, FREET4, T3FREE, THYROIDAB in the last 72 hours. Anemia Panel: No results for input(s): VITAMINB12, FOLATE, FERRITIN, TIBC, IRON, RETICCTPCT in the last 72 hours. Urine analysis:    Component Value Date/Time   COLORURINE YELLOW 03/10/2021 1531   APPEARANCEUR CLEAR 03/10/2021 1531   LABSPEC 1.025 03/10/2021 1531   PHURINE 6.0 03/10/2021 1531   GLUCOSEU NEGATIVE 03/10/2021 1531   HGBUR NEGATIVE 03/10/2021 1531   BILIRUBINUR NEGATIVE 03/10/2021 1531   KETONESUR NEGATIVE 03/10/2021 1531   PROTEINUR NEGATIVE 03/10/2021 1531   NITRITE NEGATIVE 03/10/2021 1531   LEUKOCYTESUR NEGATIVE 03/10/2021 1531   Sepsis Labs: Recent Results (from the past 240 hour(s))  Resp Panel by RT-PCR (Flu A&B, Covid) Nasopharyngeal Swab     Status: None   Collection Time: 03/19/21  5:50 AM   Specimen: Nasopharyngeal Swab; Nasopharyngeal(NP) swabs in vial transport medium  Result Value Ref Range Status   SARS Coronavirus 2 by RT PCR NEGATIVE NEGATIVE Final    Comment: (NOTE) SARS-CoV-2 target nucleic acids are NOT DETECTED.  The SARS-CoV-2 RNA is generally detectable in upper respiratory specimens during the acute phase of infection. The lowest concentration of SARS-CoV-2 viral copies this assay can detect is 138 copies/mL. A negative result does not preclude SARS-Cov-2 infection and should not be used as the sole basis for treatment or other patient management decisions. A negative result may occur with  improper specimen collection/handling, submission of specimen other than nasopharyngeal swab, presence of viral mutation(s) within the areas targeted by this assay, and inadequate number  of viral copies(<138 copies/mL). A negative result must be combined with clinical observations, patient history, and epidemiological information. The expected result is Negative.  Fact Sheet for Patients:  EntrepreneurPulse.com.au  Fact Sheet for Healthcare Providers:  IncredibleEmployment.be  This test is no t yet approved or cleared by the [place] FDA and  has been authorized for detection and/or diagnosis of SARS-CoV-2 by FDA under an Emergency Use  Authorization (EUA). This EUA will remain  in effect (meaning this test can be used) for the duration of the COVID-19 declaration under Section 564(b)(1) of the Act, 21 U.S.C.section 360bbb-3(b)(1), unless the authorization is terminated  or revoked sooner.       Influenza A by PCR NEGATIVE NEGATIVE Final   Influenza B by PCR NEGATIVE NEGATIVE Final    Comment: (NOTE) The Xpert Xpress SARS-CoV-2/FLU/RSV plus assay is intended as an aid in the diagnosis of influenza from Nasopharyngeal swab specimens and should not be used as a sole basis for treatment. Nasal washings and aspirates are unacceptable for Xpert Xpress SARS-CoV-2/FLU/RSV testing.  Fact Sheet for Patients: EntrepreneurPulse.com.au  Fact Sheet for Healthcare Providers: IncredibleEmployment.be  This test is not yet approved or cleared by the [place] FDA and has been authorized for detection and/or diagnosis of SARS-CoV-2 by FDA under an Emergency Use Authorization (EUA). This EUA will remain in effect (meaning this test can be used) for the duration of the COVID-19 declaration under Section 564(b)(1) of the Act, 21 U.S.C. section 360bbb-3(b)(1), unless the authorization is terminated or revoked.  Performed at [facility], [place] [address] [place], [place] [address]      Radiological Exams on Admission: CT Head Wo Contrast  Result Date: 03/19/2021 CLINICAL DATA:  Fall  injury on blood thinners. EXAM: CT HEAD WITHOUT CONTRAST CT CERVICAL SPINE WITHOUT CONTRAST TECHNIQUE: Multidetector CT imaging of the head and cervical spine was performed following the standard protocol without intravenous contrast. Multiplanar CT image reconstructions of the cervical spine were also generated. RADIATION DOSE REDUCTION: This exam was performed according to the departmental dose-optimization program which includes automated exposure control, adjustment of the mA and/or kV according to patient size and/or use of iterative reconstruction technique. COMPARISON:  Head CT 03/08/2021, cervical spine CT 11/10/2017 FINDINGS: CT HEAD FINDINGS Brain: There is moderately developed atrophy with atrophic ventriculomegaly and relatively mild small vessel disease in the cerebral deep white matter. There is no midline shift. The cerebellum and brainstem are not well seen due to metallic artifact from dental fillings but no obvious focal abnormality is evident. There is no focal asymmetry worrisome for acute infarct, hemorrhage or mass. There are scattered calcifications in the thalami. Vascular: There calcifications in the carotid siphons but no hyperdense central vessels. Skull: Normal. Negative for fracture or focal lesion. Sinuses/Orbits: Unremarkable orbits and orbital contents. S-shaped nasal septum. Chronic left maxillary sinus opacification and hyperostosis with protrusion of the process into the left nasal passage and calcifications in the consolidated tissue. There is patchy membrane thickening in the ethmoids. Other sinuses and the bilateral mastoid air cells are clear. Other: None. CT CERVICAL SPINE FINDINGS Alignment: Straightened lordosis is again noted with slight degenerative grade 1 anterolisthesis again at C4-5 and C5-6, stable. Skull base and vertebrae: No fracture or suspicious bone lesion. Degenerative cystic changes in the dens are chronically noted, scattered endplate Schmorl's nodes. There  is osteopenia. Narrowing and moderate spurring anterior atlantodental joint. Soft tissues and spinal canal: No prevertebral fluid or swelling. No visible canal hematoma. Disc levels: The cervical discs are diffusely degenerated except for C2-3 which maintains a normal height. Bidirectional osteophytes encroach on the cord surface at multiple levels compressing the ventral cord and causing moderate spinal canal stenosis at C3-4, C4-5, C5-6 and to a lesser extent C6-7. There is facet joint and uncinate hypertrophy throughout with multilevel foraminal stenosis most significantly at C3-4 and C4-5, and on the left at C7-T1. This was seen previously.  Upper chest: There is a chronic small ground-glass infiltrate in the posterior left apex, stable. Other: Chronic nuchal ligament calcifications, C6 level. IMPRESSION: 1. No acute intracranial CT findings, depressed skull fractures, or interval changes. 2. Chronic left maxillary sinusitis. 3. Osteopenia and degenerative change of the cervical spine without evidence of fractures or interval changes. Electronically Signed   By: Telford Nab M.D.   On: 03/19/2021 06:38   CT Cervical Spine Wo Contrast  Result Date: 03/19/2021 CLINICAL DATA:  Fall injury on blood thinners. EXAM: CT HEAD WITHOUT CONTRAST CT CERVICAL SPINE WITHOUT CONTRAST TECHNIQUE: Multidetector CT imaging of the head and cervical spine was performed following the standard protocol without intravenous contrast. Multiplanar CT image reconstructions of the cervical spine were also generated. RADIATION DOSE REDUCTION: This exam was performed according to the departmental dose-optimization program which includes automated exposure control, adjustment of the mA and/or kV according to patient size and/or use of iterative reconstruction technique. COMPARISON:  Head CT 03/08/2021, cervical spine CT 11/10/2017 FINDINGS: CT HEAD FINDINGS Brain: There is moderately developed atrophy with atrophic ventriculomegaly and  relatively mild small vessel disease in the cerebral deep white matter. There is no midline shift. The cerebellum and brainstem are not well seen due to metallic artifact from dental fillings but no obvious focal abnormality is evident. There is no focal asymmetry worrisome for acute infarct, hemorrhage or mass. There are scattered calcifications in the thalami. Vascular: There calcifications in the carotid siphons but no hyperdense central vessels. Skull: Normal. Negative for fracture or focal lesion. Sinuses/Orbits: Unremarkable orbits and orbital contents. S-shaped nasal septum. Chronic left maxillary sinus opacification and hyperostosis with protrusion of the process into the left nasal passage and calcifications in the consolidated tissue. There is patchy membrane thickening in the ethmoids. Other sinuses and the bilateral mastoid air cells are clear. Other: None. CT CERVICAL SPINE FINDINGS Alignment: Straightened lordosis is again noted with slight degenerative grade 1 anterolisthesis again at C4-5 and C5-6, stable. Skull base and vertebrae: No fracture or suspicious bone lesion. Degenerative cystic changes in the dens are chronically noted, scattered endplate Schmorl's nodes. There is osteopenia. Narrowing and moderate spurring anterior atlantodental joint. Soft tissues and spinal canal: No prevertebral fluid or swelling. No visible canal hematoma. Disc levels: The cervical discs are diffusely degenerated except for C2-3 which maintains a normal height. Bidirectional osteophytes encroach on the cord surface at multiple levels compressing the ventral cord and causing moderate spinal canal stenosis at C3-4, C4-5, C5-6 and to a lesser extent C6-7. There is facet joint and uncinate hypertrophy throughout with multilevel foraminal stenosis most significantly at C3-4 and C4-5, and on the left at C7-T1. This was seen previously. Upper chest: There is a chronic small ground-glass infiltrate in the posterior left  apex, stable. Other: Chronic nuchal ligament calcifications, C6 level. IMPRESSION: 1. No acute intracranial CT findings, depressed skull fractures, or interval changes. 2. Chronic left maxillary sinusitis. 3. Osteopenia and degenerative change of the cervical spine without evidence of fractures or interval changes. Electronically Signed   By: Telford Nab M.D.   On: 03/19/2021 06:38   DG Pelvis Portable  Result Date: 03/19/2021 CLINICAL DATA:  81 year old male with history of trauma from a fall. EXAM: PORTABLE PELVIS 1-2 VIEWS COMPARISON:  No priors. FINDINGS: There is no evidence of pelvic fracture or diastasis. No pelvic bone lesions are seen. IMPRESSION: Negative. Electronically Signed   By: Vinnie Langton M.D.   On: 03/19/2021 06:12   DG Chest Portable 1 View  Result Date: 03/19/2021 CLINICAL DATA:  81 year old male with history of trauma from a fall. EXAM: PORTABLE CHEST 1 VIEW COMPARISON:  Chest x-ray 11/11/2017. FINDINGS: Calcified pleural plaques are noted in the thorax bilaterally, indicative of asbestos related pleural disease. Regional areas of architectural distortion are noted in the lungs bilaterally, likely reflective of some associated fibrosis, potentially asbestosis, similar to the prior examination allowing for differences in patient positioning and inspiration. No definite acute consolidative airspace disease. No pleural effusions. No pneumothorax. No evidence of pulmonary edema. Heart size is normal. Upper mediastinal contours are within normal limits. Atherosclerotic calcifications are noted in the thoracic aorta. IMPRESSION: 1. No definite radiographic evidence of significant acute traumatic injury to the thorax. 2. 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 These findings could be better evaluated with follow-up nonemergent high-resolution chest CT if clinically appropriate. 3. Aortic atherosclerosis. Electronically Signed   By: Vinnie Langton M.D.   On: 03/19/2021 06:13   CT ANGIO GI BLEED  Result Date: 03/19/2021 CLINICAL DATA:  Acute mesenteric ischemia, abdominal pain EXAM: CTA ABDOMEN AND PELVIS WITHOUT AND WITH CONTRAST TECHNIQUE: Multidetector CT imaging of the abdomen and pelvis was performed using the standard protocol during bolus administration of intravenous contrast. Multiplanar reconstructed images and MIPs were obtained and reviewed to evaluate the vascular anatomy. CONTRAST:  137mL OMNIPAQUE IOHEXOL 350 MG/ML SOLN COMPARISON:  10/22/2004 FINDINGS: VASCULAR Aorta: Moderate partially calcified atheromatous plaque particularly in the infrarenal segment. No aneurysm, dissection, or stenosis. Celiac: Calcified ostial plaque with a mild smooth stenosis, widely patent distally. SMA: Patent without evidence of aneurysm, dissection, vasculitis or significant stenosis. Renals: Duplicated left, inferior dominant, both patent. Single right, widely patent. IMA: Patent without evidence of aneurysm, dissection, vasculitis or significant stenosis. Inflow: Mild bilateral common iliac and external iliac partially calcified plaque, without aneurysm, dissection, or stenosis. Proximal Outflow: Bilateral common femoral and visualized portions of the superficial and profunda femoral arteries are patent without evidence of aneurysm, dissection, vasculitis or significant stenosis. Veins: No obvious venous abnormality within the limitations of this arterial phase study. Review of the MIP images confirms the above findings. NON-VASCULAR Lower chest: No pleural or pericardial effusion. Partially calcified bilateral pleural plaques. Hepatobiliary: No focal liver abnormality is seen. No gallstones, gallbladder wall thickening, or biliary dilatation. Pancreas: Unremarkable. No pancreatic ductal dilatation or surrounding inflammatory changes. Spleen: Normal in size without focal abnormality. Adrenals/Urinary Tract: Mildly heterogenous 1.6 cm right adrenal  nodule measuring 92 Hounsfield units, new since previous. No renal mass or hydronephrosis. Urinary bladder is decompressed by Foley catheter. Stomach/Bowel: Stomach physiologically distended by fluid. The small bowel is decompressed, unremarkable. The colon is nondilated, unremarkable. Lymphatic: Subcentimeter left para-aortic and bilateral common iliac lymph nodes. No mesenteric adenopathy. Reproductive: Prostate enlargement Other: Bilateral pelvic phleboliths.  No ascites.  No free air. Musculoskeletal: No fracture or worrisome bone lesion. Degenerative disc disease L4-5. IMPRESSION: 1. No acute findings. No significant proximal mesenteric arterial occlusive disease. 2. 1.6 cm nonspecific right adrenal nodule, possible pheochromocytoma versus adenoma. Recommend biochemical lab evaluation for pheochromocytoma. If lab values normal, 1 year follow-up adrenal washout CT. References: JACR 2017 Aug; 14(8):1038-44, JCAT 2016 Mar-Apr; 40(2):194-200. 3. Aortoiliac  atherosclerosis (ICD10-170.0). 4. 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   On: 03/19/2021 08:30    EKG: Independently reviewed.  Sinus rhythm at 85 bpm  Assessment/Plan Acute blood loss anemia secondary to GI bleed: Patient presents after having fall out  of bed where he was suspected to have a syncopal event.  Daughter reported patient had a large bloody bowel movement yesterday and was noted to have a large melanotic stool here in the ED.  Hemoglobin on admission 11.1 g/dL, but previously had been 13.9 on 1/2.  Suspect secondary to patient recently being placed on dual antiplatelet therapy due to concern for TIA during recent admission.  Unclear if patient has ever had a colonoscopy before. -Admit to a medical telemetry bed -Clear liquid diet and n.p.o. after midnight -Hold aspirin and Plavix -Continue Protonix drip  -Appreciate [facility] consultative services,  we will follow-up for any further recommendation  Fall  Syncope  orthostatic hypotension: Acute on chronic.  Patient presents after falling out of bed with reported loss of consciousness and or seizure-like activity.  Likely multifactorial nature including patient's acute blood loss.  Although prior to blood loss patient has had issues with dizziness and orthostatic hypotension that per review of his last discharge with thought possibly related with rivastigmine.  However this medication had not been discontinued during his last hospitalization.  Daughter notes that the patient due to his memory loss will get up quickly which likely results in these incidences. -Fall precautions -Seizure precautions -Set bed alarm on -Follow-up telemetry -May benefit from wearing abdominal binder  Leukocytosis  thrombocytosis: Acute.  WBC elevated at 15.3 and platelet count 440.  Suspect reactive in nature due to GI bleed, but patient was treated with fosfomycin due to abnormal urinalysis during last specialization.  He has continued to have Foley catheter in place. -Check urinalysis  Renal insufficiency: Acute.  Creatinine 1.38 with BUN 25.  Baseline creatinine previously 1-1.1.  This is not greater than 0.3 but patient's baseline to signify acute kidney injury.  Suspect secondary to GI bleed. -Gentle IV fluids at 75 mL/h -Recheck kidney function tomorrow morning  Essential hypertension: Home medication regimen includes lisinopril 40 mg daily and isosorbide mononitrate 30 mg daily. -Held home blood pressure regimen in the setting of GI bleed and soft blood pressure  Dementia: Present on admission.  Medication regimen includes rivastigmine patch 4.6 mg / 24-hour.  During previous hospitalization it seems that this was recommended to possibly be discontinued due to it possibly causing syncope, bradycardia, and hypotension. -Set bed alarm on -Held rivastigmine -Consider discussing with patient's neurologist Dr.  Laurena Slimmer at [facility]  History of TIA: Patient had just recently on 1/1 with concern for left-sided weakness to be secondary to a TIA as work-up was negative.  Patient was started on aspirin and Plavix by neurology.  Fracture of distal radius and ulna: Prior to arrival.  Patient fell on 12/28 sustaining injuries. -Continue outpatient follow-up with orthopedic  BPH with  urinary retention: Patient had Foley catheter placed on 1/4 prior to being sent to SNF.  However, daughter makes it known that patient had been having issues with urinary urgency and frequency prior to this.  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  It had been recommended to try a voiding trial in a couple of days, but unclear if this was ever done. -Recommend voiding trial after acute issues have been addressed -Unfortunately some medications for BPH can have side effects such as dizziness and weakness.  It may be best for patient to follow-up with urology as previously scheduled as they can offer such options  Hyperlipidemia -Continue pravastatin  GERD -Protonix IV as noted above  DNR: Present on admission  DVT prophylaxis: SCDs Code Status: DNR Family Communication:  Daughter updated at bedside Disposition Plan: Likely discharge back to SNF Consults called: Gastroenterology Admission status: Observation Norval Morton MD [facility]   If 7PM-7AM, please contact night-coverage   03/19/2021, 9:02 AM

## 2021-03-19 NOTE — ED Notes (Signed)
Bladder scan [facility]

## 2021-03-19 NOTE — ED Provider Notes (Signed)
Emergency Department Provider Note   I have reviewed the triage vital signs and the nursing notes.   HISTORY  Chief Complaint No chief complaint on file.   HPI Matthew Knight is a 81 y.o. male with past medical history of diabetes, on Plavix, presents to the emergency department after fall at his nursing facility this morning.  He was activated as a level 2 trauma by protocol.  The initial event was not witnessed.  He apparently tried to get out of bed this morning and staff found him on the ground.  EMS arrived and found the patient to have poor coloring.  They sat him up and that he apparently had a brief, less than 1 minute, of staring and total body stiffening with some hand shaking.  No known history of seizure.  He vomited shortly afterwards. EMS noted some hypotension initially which improved with IVF. Denies CP.   Level 5 caveat: Dementia    Past Medical History:  Diagnosis Date   Dementia ([place])    Diabetes mellitus without complication (HCC)    Hearing loss    Hepatitis    Small vessel disease ([place])    Stroke ([place])     Review of Systems  Level 5 caveat: EMS   ____________________________________________   PHYSICAL EXAM:  VITAL SIGNS: ED Triage Vitals [03/19/21 0540]  Enc Vitals Group     BP (!) 112/46     Pulse Rate 73     Resp 19     Temp 98 F (36.7 C)     Temp Source Oral     SpO2 98 %   Constitutional: Alert and talkative but confused. Well appearing and in no acute distress. Eyes: Conjunctivae are normal. PERRL (3 mm) Head: Atraumatic. Nose: No congestion/rhinnorhea. Mouth/Throat: Mucous membranes are moist.  Neck: No stridor.  Cardiovascular: Normal rate, regular rhythm. Good peripheral circulation. Grossly normal heart sounds.   Respiratory: Normal respiratory effort.  No retractions. Lungs CTAB. Gastrointestinal: Soft and nontender. Positive distention.  Musculoskeletal: No lower extremity tenderness nor edema. No gross deformities  of extremities. Neurologic:  Normal speech and language. No gross focal neurologic deficits are appreciated.  Skin:  Skin is warm, dry and intact. No rash noted.  ____________________________________________   LABS (all labs ordered are listed, but only abnormal results are displayed)  Labs Reviewed  COMPREHENSIVE METABOLIC PANEL - Abnormal; Notable for the following components:      Result Value   Glucose, Bld 136 (*)    BUN 25 (*)    Creatinine, Ser 1.38 (*)    Calcium 8.7 (*)    Total Protein 5.9 (*)    Albumin 2.9 (*)    GFR, Estimated 52 (*)    All other components within normal limits  CBC WITH DIFFERENTIAL/PLATELET - Abnormal; Notable for the following components:   WBC 15.3 (*)    RBC 3.91 (*)    Hemoglobin 11.1 (*)    HCT 34.9 (*)    Platelets 440 (*)    Neutro Abs 11.5 (*)    Monocytes Absolute 1.2 (*)    Abs Immature Granulocytes 0.13 (*)    All other components within normal limits  HEMOGLOBIN AND HEMATOCRIT, BLOOD - Abnormal; Notable for the following components:   Hemoglobin 10.8 (*)    HCT 34.1 (*)    All other components within normal limits  CBC - Abnormal; Notable for the following components:   WBC 14.8 (*)    RBC 3.24 (*)  Hemoglobin 9.4 (*)    HCT 28.5 (*)    Platelets 436 (*)    All other components within normal limits  BASIC METABOLIC PANEL - Abnormal; Notable for the following components:   Glucose, Bld 110 (*)    BUN 24 (*)    Calcium 8.0 (*)    GFR, Estimated 60 (*)    Anion gap 4 (*)    All other components within normal limits  POC OCCULT BLOOD, ED - Abnormal; Notable for the following components:   Fecal Occult Bld POSITIVE (*)    All other components within normal limits  RESP PANEL BY RT-PCR (FLU A&B, COVID) ARPGX2  LIPASE, BLOOD  PROTIME-INR  URINALYSIS, ROUTINE W REFLEX MICROSCOPIC  URINALYSIS, ROUTINE W REFLEX MICROSCOPIC  CBC  BASIC METABOLIC PANEL  TYPE AND SCREEN  ABO/RH  TROPONIN I (HIGH SENSITIVITY)  TROPONIN I  (HIGH SENSITIVITY)   ____________________________________________  EKG   EKG Interpretation  Date/Time:  Thursday March 19 2021 06:18:50 EST Ventricular Rate:  85 PR Interval:  155 QRS Duration: 106 QT Interval:  389 QTC Calculation: 463 R Axis:   90 Text Interpretation: Sinus rhythm Borderline right axis deviation Confirmed by Nanda Quinton [phone]) on 03/19/2021 6:45:44 AM        ____________________________________________  RADIOLOGY  CT Head Wo Contrast  Result Date: 03/19/2021 CLINICAL DATA:  Fall injury on blood thinners. EXAM: CT HEAD WITHOUT CONTRAST CT CERVICAL SPINE WITHOUT CONTRAST TECHNIQUE: Multidetector CT imaging of the head and cervical spine was performed following the standard protocol without intravenous contrast. Multiplanar CT image reconstructions of the cervical spine were also generated. RADIATION DOSE REDUCTION: This exam was performed according to the departmental dose-optimization program which includes automated exposure control, adjustment of the mA and/or kV according to patient size and/or use of iterative reconstruction technique. COMPARISON:  Head CT 03/08/2021, cervical spine CT 11/10/2017 FINDINGS: CT HEAD FINDINGS Brain: There is moderately developed atrophy with atrophic ventriculomegaly and relatively mild small vessel disease in the cerebral deep white matter. There is no midline shift. The cerebellum and brainstem are not well seen due to metallic artifact from dental fillings but no obvious focal abnormality is evident. There is no focal asymmetry worrisome for acute infarct, hemorrhage or mass. There are scattered calcifications in the thalami. Vascular: There calcifications in the carotid siphons but no hyperdense central vessels. Skull: Normal. Negative for fracture or focal lesion. Sinuses/Orbits: Unremarkable orbits and orbital contents. S-shaped nasal septum. Chronic left maxillary sinus opacification and hyperostosis with protrusion of the  process into the left nasal passage and calcifications in the consolidated tissue. There is patchy membrane thickening in the ethmoids. Other sinuses and the bilateral mastoid air cells are clear. Other: None. CT CERVICAL SPINE FINDINGS Alignment: Straightened lordosis is again noted with slight degenerative grade 1 anterolisthesis again at C4-5 and C5-6, stable. Skull base and vertebrae: No fracture or suspicious bone lesion. Degenerative cystic changes in the dens are chronically noted, scattered endplate Schmorl's nodes. There is osteopenia. Narrowing and moderate spurring anterior atlantodental joint. Soft tissues and spinal canal: No prevertebral fluid or swelling. No visible canal hematoma. Disc levels: The cervical discs are diffusely degenerated except for C2-3 which maintains a normal height. Bidirectional osteophytes encroach on the cord surface at multiple levels compressing the ventral cord and causing moderate spinal canal stenosis at C3-4, C4-5, C5-6 and to a lesser extent C6-7. There is facet joint and uncinate hypertrophy throughout with multilevel foraminal stenosis most significantly at C3-4 and C4-5, and on  the left at C7-T1. This was seen previously. Upper chest: There is a chronic small ground-glass infiltrate in the posterior left apex, stable. Other: Chronic nuchal ligament calcifications, C6 level. IMPRESSION: 1. No acute intracranial CT findings, depressed skull fractures, or interval changes. 2. Chronic left maxillary sinusitis. 3. Osteopenia and degenerative change of the cervical spine without evidence of fractures or interval changes. Electronically Signed   By: Telford Nab M.D.   On: 03/19/2021 06:38   CT Cervical Spine Wo Contrast  Result Date: 03/19/2021 CLINICAL DATA:  Fall injury on blood thinners. EXAM: CT HEAD WITHOUT CONTRAST CT CERVICAL SPINE WITHOUT CONTRAST TECHNIQUE: Multidetector CT imaging of the head and cervical spine was performed following the standard protocol  without intravenous contrast. Multiplanar CT image reconstructions of the cervical spine were also generated. RADIATION DOSE REDUCTION: This exam was performed according to the departmental dose-optimization program which includes automated exposure control, adjustment of the mA and/or kV according to patient size and/or use of iterative reconstruction technique. COMPARISON:  Head CT 03/08/2021, cervical spine CT 11/10/2017 FINDINGS: CT HEAD FINDINGS Brain: There is moderately developed atrophy with atrophic ventriculomegaly and relatively mild small vessel disease in the cerebral deep white matter. There is no midline shift. The cerebellum and brainstem are not well seen due to metallic artifact from dental fillings but no obvious focal abnormality is evident. There is no focal asymmetry worrisome for acute infarct, hemorrhage or mass. There are scattered calcifications in the thalami. Vascular: There calcifications in the carotid siphons but no hyperdense central vessels. Skull: Normal. Negative for fracture or focal lesion. Sinuses/Orbits: Unremarkable orbits and orbital contents. S-shaped nasal septum. Chronic left maxillary sinus opacification and hyperostosis with protrusion of the process into the left nasal passage and calcifications in the consolidated tissue. There is patchy membrane thickening in the ethmoids. Other sinuses and the bilateral mastoid air cells are clear. Other: None. CT CERVICAL SPINE FINDINGS Alignment: Straightened lordosis is again noted with slight degenerative grade 1 anterolisthesis again at C4-5 and C5-6, stable. Skull base and vertebrae: No fracture or suspicious bone lesion. Degenerative cystic changes in the dens are chronically noted, scattered endplate Schmorl's nodes. There is osteopenia. Narrowing and moderate spurring anterior atlantodental joint. Soft tissues and spinal canal: No prevertebral fluid or swelling. No visible canal hematoma. Disc levels: The cervical discs are  diffusely degenerated except for C2-3 which maintains a normal height. Bidirectional osteophytes encroach on the cord surface at multiple levels compressing the ventral cord and causing moderate spinal canal stenosis at C3-4, C4-5, C5-6 and to a lesser extent C6-7. There is facet joint and uncinate hypertrophy throughout with multilevel foraminal stenosis most significantly at C3-4 and C4-5, and on the left at C7-T1. This was seen previously. Upper chest: There is a chronic small ground-glass infiltrate in the posterior left apex, stable. Other: Chronic nuchal ligament calcifications, C6 level. IMPRESSION: 1. No acute intracranial CT findings, depressed skull fractures, or interval changes. 2. Chronic left maxillary sinusitis. 3. Osteopenia and degenerative change of the cervical spine without evidence of fractures or interval changes. Electronically Signed   By: Telford Nab M.D.   On: 03/19/2021 06:38   DG Pelvis Portable  Result Date: 03/19/2021 CLINICAL DATA:  82 year old male with history of trauma from a fall. EXAM: PORTABLE PELVIS 1-2 VIEWS COMPARISON:  No priors. FINDINGS: There is no evidence of pelvic fracture or diastasis. No pelvic bone lesions are seen. IMPRESSION: Negative. Electronically Signed   By: Vinnie Langton M.D.   On: 03/19/2021 06:12  DG Chest Portable 1 View  Result Date: 03/19/2021 CLINICAL DATA:  81 year old male with history of trauma from a fall. EXAM: PORTABLE CHEST 1 VIEW COMPARISON:  Chest x-ray 11/11/2017. FINDINGS: Calcified pleural plaques are noted in the thorax bilaterally, indicative of asbestos related pleural disease. Regional areas of architectural distortion are noted in the lungs bilaterally, likely reflective of some associated fibrosis, potentially asbestosis, similar to the prior examination allowing for differences in patient positioning and inspiration. No definite acute consolidative airspace disease. No pleural effusions. No pneumothorax. No evidence of  pulmonary edema. Heart size is normal. Upper mediastinal contours are within normal limits. Atherosclerotic calcifications are noted in the thoracic aorta. IMPRESSION: 1. No definite radiographic evidence of significant acute traumatic injury to the thorax. 2. 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 These findings could be better evaluated with follow-up nonemergent high-resolution chest CT if clinically appropriate. 3. Aortic atherosclerosis. Electronically Signed   By: Vinnie Langton M.D.   On: 03/19/2021 06:13    ____________________________________________   PROCEDURES  Procedure(s) performed:   Procedures  CRITICAL CARE Performed by: Margette Fast Total critical care time: 35 minutes Critical care time was exclusive of separately billable procedures and treating other patients. Critical care was necessary to treat or prevent imminent or life-threatening deterioration. Critical care was time spent personally by me on the following activities: development of treatment plan with patient and/or surrogate as well as nursing, discussions with consultants, evaluation of patient's response to treatment, examination of patient, obtaining history from patient or surrogate, ordering and performing treatments and interventions, ordering and review of laboratory studies, ordering and review of radiographic studies, pulse oximetry and re-evaluation of patient's condition.  Nanda Quinton, MD Emergency Medicine  ____________________________________________   INITIAL IMPRESSION / ASSESSMENT AND PLAN / ED COURSE  Pertinent labs & imaging results that were available during my care of the patient were reviewed by me and considered in my medical decision making (see chart for details).   This patient is Presenting for Evaluation of fall and abdominal pain, which does require a range of treatment options, and is a complaint that involves a high risk of morbidity  and mortality.  The Differential Diagnoses include for altered mental status includes but is not exclusive to alcohol, illicit or prescription medications, intracranial pathology such as stroke, intracerebral hemorrhage, fever or infectious causes including sepsis, hypoxemia, uremia, trauma, endocrine related disorders such as diabetes, hypoglycemia, thyroid-related diseases, etc.   Critical Interventions- IVF, trauma imaging, and monitoring.    Reassessment after intervention:  06:51 AM  Patient CT imaging of the head shows no acute hemorrhage.  Chest x-ray and pelvic plain films are within normal limits.  On further evaluation the patient is passing some moderate volume maroon-colored stool.  I was able to evaluate this at bedside.  Plan to send a type and screen give IV fluids along with protonix.  We will send patient back for a CT angio bleed study to evaluate for possible diverticular source.    I did Additional Historical Information from EMS, as the patient has a history of dementia. They provide much of the history and timeline as above.   I decided to review pertinent External Data, and in summary patient with recent admit for CVA, started on Plavix and ASA.   Clinical Laboratory Tests Ordered, CBC showing a 3g drop in Hb compared to recent admit labs. Creatinine at 1.38. No electrolyte disturbance. Grossly heme-positive stool on exam with is occult positive as  well. COVID and Flu negative.   Radiologic Tests Ordered, included CT head and c-spine along with plain films. I personally evaluated imaging above and agree with radiology interpretation.   Cardiac Monitor Tracing which shows NSR. EKG interpreted by me as above.   Medical Decision Making: Summary:  Patient arrives as a level 2 trauma after fall on antiplatelets.  He appears to have some GI bleeding on arrival and hemoglobin is lower than prior.  Question if this may have caused syncope type event leading to the fall.  From a  trauma perspective, the patient CT imaging are normal and imaging of the chest and pelvis showed no acute fractures, pneumothorax, dislocation.  Patient will need to go back for CT angio/GI bleed scan which was ordered to evaluate further for possible diverticular or other GI bleeding source that may be treatable in IR. Care transferred to Dr. Langston Masker. Anticipate admit.   Disposition: Admit  ____________________________________________  FINAL CLINICAL IMPRESSION(S) / ED DIAGNOSES  Final diagnoses:  Gastrointestinal hemorrhage, unspecified gastrointestinal hemorrhage type  Near syncope     MEDICATIONS GIVEN DURING THIS VISIT:  Medications  escitalopram (LEXAPRO) tablet 20 mg (20 mg Oral Given 03/20/21 0949)  pravastatin (PRAVACHOL) tablet 20 mg (20 mg Oral Given 03/20/21 0949)  sodium chloride flush (NS) 0.9 % injection 3 mL (3 mLs Intravenous Given 03/20/21 2028)  acetaminophen (TYLENOL) tablet 650 mg (650 mg Oral Given 03/19/21 1405)    Or  acetaminophen (TYLENOL) suppository 650 mg ( Rectal See Alternative 03/19/21 1405)  ondansetron (ZOFRAN) tablet 4 mg (has no administration in time range)    Or  ondansetron (ZOFRAN) injection 4 mg (has no administration in time range)  albuterol (PROVENTIL) (2.5 MG/3ML) 0.083% nebulizer solution 2.5 mg (has no administration in time range)  latanoprost (XALATAN) 0.005 % ophthalmic solution 1 drop (1 drop Both Eyes Given 03/20/21 2028)  pantoprazole (PROTONIX) injection 40 mg (40 mg Intravenous Given 03/20/21 2027)  Chlorhexidine Gluconate Cloth 2 % PADS 6 each (6 each Topical Given 03/20/21 0950)  meclizine (ANTIVERT) tablet 25 mg (has no administration in time range)  ondansetron (ZOFRAN) injection 4 mg (4 mg Intravenous Given 03/19/21 0559)  sodium chloride 0.9 % bolus 1,000 mL (0 mLs Intravenous Stopped 03/19/21 0847)  pantoprazole (PROTONIX) 80 mg /NS 100 mL IVPB (0 mg Intravenous Stopped 03/19/21 0916)  iohexol (OMNIPAQUE) 350 MG/ML injection 100 mL  (100 mLs Intravenous Contrast Given 03/19/21 0749)     Note:  This document was prepared using Dragon voice recognition software and may include unintentional dictation errors.  Nanda Quinton, MD, [facility] Emergency Medicine    Maverick Dieudonne, [facility], MD 03/21/21 [phone]

## 2021-03-19 NOTE — Progress Notes (Signed)
Orthopedic Tech Progress Note Patient Details:  Matthew Knight 15-Mar-1940 [id number] Level 2 trauma Patient ID: Matthew Knight, male   DOB: 1940/07/08, 81 y.o.   MRN: [id number]  Matthew Knight 03/19/2021, 6:11 AM

## 2021-03-19 NOTE — ED Notes (Signed)
Patient transported to CT 

## 2021-03-20 ENCOUNTER — Encounter (HOSPITAL_COMMUNITY): Payer: Self-pay | Admitting: Internal Medicine

## 2021-03-20 ENCOUNTER — Other Ambulatory Visit: Payer: Self-pay

## 2021-03-20 DIAGNOSIS — H9190 Unspecified hearing loss, unspecified ear: Secondary | ICD-10-CM | POA: Diagnosis present

## 2021-03-20 DIAGNOSIS — K635 Polyp of colon: Secondary | ICD-10-CM | POA: Diagnosis present

## 2021-03-20 DIAGNOSIS — K31811 Angiodysplasia of stomach and duodenum with bleeding: Secondary | ICD-10-CM | POA: Diagnosis not present

## 2021-03-20 DIAGNOSIS — D122 Benign neoplasm of ascending colon: Secondary | ICD-10-CM | POA: Diagnosis not present

## 2021-03-20 DIAGNOSIS — R338 Other retention of urine: Secondary | ICD-10-CM | POA: Diagnosis present

## 2021-03-20 DIAGNOSIS — D72829 Elevated white blood cell count, unspecified: Secondary | ICD-10-CM | POA: Diagnosis not present

## 2021-03-20 DIAGNOSIS — F17290 Nicotine dependence, other tobacco product, uncomplicated: Secondary | ICD-10-CM | POA: Diagnosis present

## 2021-03-20 DIAGNOSIS — F0390 Unspecified dementia without behavioral disturbance: Secondary | ICD-10-CM | POA: Diagnosis present

## 2021-03-20 DIAGNOSIS — Z20822 Contact with and (suspected) exposure to covid-19: Secondary | ICD-10-CM | POA: Diagnosis present

## 2021-03-20 DIAGNOSIS — I951 Orthostatic hypotension: Secondary | ICD-10-CM | POA: Diagnosis present

## 2021-03-20 DIAGNOSIS — W06XXXA Fall from bed, initial encounter: Secondary | ICD-10-CM | POA: Diagnosis present

## 2021-03-20 DIAGNOSIS — Z66 Do not resuscitate: Secondary | ICD-10-CM | POA: Diagnosis present

## 2021-03-20 DIAGNOSIS — K2970 Gastritis, unspecified, without bleeding: Secondary | ICD-10-CM | POA: Diagnosis present

## 2021-03-20 DIAGNOSIS — E119 Type 2 diabetes mellitus without complications: Secondary | ICD-10-CM | POA: Diagnosis present

## 2021-03-20 DIAGNOSIS — K5520 Angiodysplasia of colon without hemorrhage: Secondary | ICD-10-CM | POA: Diagnosis not present

## 2021-03-20 DIAGNOSIS — N179 Acute kidney failure, unspecified: Secondary | ICD-10-CM | POA: Diagnosis present

## 2021-03-20 DIAGNOSIS — K922 Gastrointestinal hemorrhage, unspecified: Secondary | ICD-10-CM

## 2021-03-20 DIAGNOSIS — K3189 Other diseases of stomach and duodenum: Secondary | ICD-10-CM | POA: Diagnosis present

## 2021-03-20 DIAGNOSIS — K5521 Angiodysplasia of colon with hemorrhage: Secondary | ICD-10-CM | POA: Diagnosis present

## 2021-03-20 DIAGNOSIS — D75839 Thrombocytosis, unspecified: Secondary | ICD-10-CM | POA: Diagnosis present

## 2021-03-20 DIAGNOSIS — S52572A Other intraarticular fracture of lower end of left radius, initial encounter for closed fracture: Secondary | ICD-10-CM | POA: Diagnosis present

## 2021-03-20 DIAGNOSIS — D123 Benign neoplasm of transverse colon: Secondary | ICD-10-CM | POA: Diagnosis not present

## 2021-03-20 DIAGNOSIS — S52502S Unspecified fracture of the lower end of left radius, sequela: Secondary | ICD-10-CM | POA: Diagnosis not present

## 2021-03-20 DIAGNOSIS — E785 Hyperlipidemia, unspecified: Secondary | ICD-10-CM | POA: Diagnosis present

## 2021-03-20 DIAGNOSIS — G8194 Hemiplegia, unspecified affecting left nondominant side: Secondary | ICD-10-CM | POA: Diagnosis present

## 2021-03-20 DIAGNOSIS — N401 Enlarged prostate with lower urinary tract symptoms: Secondary | ICD-10-CM | POA: Diagnosis present

## 2021-03-20 DIAGNOSIS — K219 Gastro-esophageal reflux disease without esophagitis: Secondary | ICD-10-CM | POA: Diagnosis present

## 2021-03-20 DIAGNOSIS — S52615A Nondisplaced fracture of left ulna styloid process, initial encounter for closed fracture: Secondary | ICD-10-CM | POA: Diagnosis present

## 2021-03-20 DIAGNOSIS — R55 Syncope and collapse: Secondary | ICD-10-CM | POA: Diagnosis not present

## 2021-03-20 DIAGNOSIS — D62 Acute posthemorrhagic anemia: Secondary | ICD-10-CM | POA: Diagnosis present

## 2021-03-20 DIAGNOSIS — K449 Diaphragmatic hernia without obstruction or gangrene: Secondary | ICD-10-CM | POA: Diagnosis present

## 2021-03-20 DIAGNOSIS — S0990XA Unspecified injury of head, initial encounter: Secondary | ICD-10-CM | POA: Diagnosis present

## 2021-03-20 DIAGNOSIS — I10 Essential (primary) hypertension: Secondary | ICD-10-CM | POA: Diagnosis present

## 2021-03-20 DIAGNOSIS — K2971 Gastritis, unspecified, with bleeding: Secondary | ICD-10-CM | POA: Diagnosis not present

## 2021-03-20 DIAGNOSIS — N289 Disorder of kidney and ureter, unspecified: Secondary | ICD-10-CM | POA: Diagnosis not present

## 2021-03-20 DIAGNOSIS — Y92129 Unspecified place in nursing home as the place of occurrence of the external cause: Secondary | ICD-10-CM | POA: Diagnosis not present

## 2021-03-20 LAB — CBC
HCT: 28.5 % — ABNORMAL LOW (ref 39.0–52.0)
Hemoglobin: 9.4 g/dL — ABNORMAL LOW (ref 13.0–17.0)
MCH: 29 pg (ref 26.0–34.0)
MCHC: 33 g/dL (ref 30.0–36.0)
MCV: 88 fL (ref 80.0–100.0)
Platelets: 436 10*3/uL — ABNORMAL HIGH (ref 150–400)
RBC: 3.24 MIL/uL — ABNORMAL LOW (ref 4.22–5.81)
RDW: 12.7 % (ref 11.5–15.5)
WBC: 14.8 10*3/uL — ABNORMAL HIGH (ref 4.0–10.5)
nRBC: 0 % (ref 0.0–0.2)

## 2021-03-20 LAB — BASIC METABOLIC PANEL
Anion gap: 4 — ABNORMAL LOW (ref 5–15)
BUN: 24 mg/dL — ABNORMAL HIGH (ref 8–23)
CO2: 26 mmol/L (ref 22–32)
Calcium: 8 mg/dL — ABNORMAL LOW (ref 8.9–10.3)
Chloride: 109 mmol/L (ref 98–111)
Creatinine, Ser: 1.22 mg/dL (ref 0.61–1.24)
GFR, Estimated: 60 mL/min — ABNORMAL LOW (ref >60)
Glucose, Bld: 110 mg/dL — ABNORMAL HIGH (ref 70–99)
Potassium: 4.6 mmol/L (ref 3.5–5.1)
Sodium: 139 mmol/L (ref 135–145)

## 2021-03-20 MED ORDER — MECLIZINE HCL 25 MG PO TABS
25.0000 mg | ORAL_TABLET | Freq: Three times a day (TID) | ORAL | Status: DC | PRN
  Filled 2021-03-20: qty 1

## 2021-03-20 MED ORDER — ISOSORBIDE MONONITRATE ER 30 MG PO TB24: 30.0000 mg | ORAL_TABLET | Freq: Every day | ORAL | Status: DC

## 2021-03-20 NOTE — Progress Notes (Signed)
Progress Note Hospital Day: 2  Chief Complaint:  Heme positive anemia       ASSESSMENT AND PLAN   Brief History:  81 yo male with dementia, TIA on asa / plavix, DM. Admitted with near syncope, anemia / GI bleed on plavix and asa, left radius / ulna fracture after fall late December 2022.   # GI bleed on plavix  / acute anemia / near syncope. Apparently passed bright red blood from rectum?   --CTA negative for extravasation --Baseline hgb around 13-14, 11.1 on admission >> 9.4 today.  --Plan is for EGD and colonoscopy on Monday. Will see him again on Sunday.       # Dementia, pleasantly confused. Didn't realize he is in hospital   SUBJECTIVE     Awoke him from sleep. No complaints. No abdominal pain " unless someone is pushing on it"    OBJECTIVE      Scheduled inpatient medications:   Chlorhexidine Gluconate Cloth  6 each Topical Daily   escitalopram  20 mg Oral Daily   latanoprost  1 drop Both Eyes QHS   pantoprazole (PROTONIX) IV  40 mg Intravenous Q12H   pravastatin  20 mg Oral Daily   sodium chloride flush  3 mL Intravenous Q12H   Continuous inpatient infusions:  PRN inpatient medications: acetaminophen **OR** acetaminophen, albuterol, ondansetron **OR** ondansetron (ZOFRAN) IV  Vital signs in last 24 hours: Temp:  [97.8 F (36.6 C)-98.6 F (37 C)] 97.8 F (36.6 C) (01/13 1031) Pulse Rate:  [71-88] 80 (01/12 2148) Resp:  [16-21] 18 (01/12 2148) BP: (123-153)/(45-74) 132/64 (01/13 1031) SpO2:  [96 %-100 %] 96 % (01/13 1031) Last BM Date: 03/10/21 No intake or output data in the 24 hours ending 03/20/21 1221   Physical Exam:  General: Alert male in NAD Heart:  Regular rate and rhythm. No lower extremity edema Pulmonary: Normal respiratory effort Abdomen: Soft, nondistended, nontender. Normal bowel sounds.  Neurologic: Alert , no oriented Psych: Pleasant. Cooperative.   Filed Weights   03/19/21 0615  Weight: 99.8 kg    Intake/Output from  previous day: 01/12 0701 - 01/13 0700 In: 1000 [IV Piggyback:1000] Out: -  Intake/Output this shift: No intake/output data recorded.    Lab Results: Recent Labs    03/19/21 0549 03/19/21 1600 03/20/21 0118  WBC 15.3*  --  14.8*  HGB 11.1* 10.8* 9.4*  HCT 34.9* 34.1* 28.5*  PLT 440*  --  436*   BMET Recent Labs    03/19/21 0549 03/20/21 0118  NA 140 139  K 4.2 4.6  CL 107 109  CO2 22 26  GLUCOSE 136* 110*  BUN 25* 24*  CREATININE 1.38* 1.22  CALCIUM 8.7* 8.0*   LFT Recent Labs    03/19/21 0549  PROT 5.9*  ALBUMIN 2.9*  AST 25  ALT 22  ALKPHOS 66  BILITOT 0.7   PT/INR Recent Labs    03/19/21 0754  LABPROT 14.7  INR 1.2   Hepatitis Panel No results for input(s): HEPBSAG, HCVAB, HEPAIGM, HEPBIGM in the last 72 hours.  CT Head Wo Contrast  Result Date: 03/19/2021 CLINICAL DATA:  Fall injury on blood thinners. EXAM: CT HEAD WITHOUT CONTRAST CT CERVICAL SPINE WITHOUT CONTRAST TECHNIQUE: Multidetector CT imaging of the head and cervical spine was performed following the standard protocol without intravenous contrast. Multiplanar CT image reconstructions of the cervical spine were also generated. RADIATION DOSE REDUCTION: This exam was performed according to the departmental dose-optimization program which includes automated  exposure control, adjustment of the mA and/or kV according to patient size and/or use of iterative reconstruction technique. COMPARISON:  Head CT 03/08/2021, cervical spine CT 11/10/2017 FINDINGS: CT HEAD FINDINGS Brain: There is moderately developed atrophy with atrophic ventriculomegaly and relatively mild small vessel disease in the cerebral deep white matter. There is no midline shift. The cerebellum and brainstem are not well seen due to metallic artifact from dental fillings but no obvious focal abnormality is evident. There is no focal asymmetry worrisome for acute infarct, hemorrhage or mass. There are scattered calcifications in the  thalami. Vascular: There calcifications in the carotid siphons but no hyperdense central vessels. Skull: Normal. Negative for fracture or focal lesion. Sinuses/Orbits: Unremarkable orbits and orbital contents. S-shaped nasal septum. Chronic left maxillary sinus opacification and hyperostosis with protrusion of the process into the left nasal passage and calcifications in the consolidated tissue. There is patchy membrane thickening in the ethmoids. Other sinuses and the bilateral mastoid air cells are clear. Other: None. CT CERVICAL SPINE FINDINGS Alignment: Straightened lordosis is again noted with slight degenerative grade 1 anterolisthesis again at C4-5 and C5-6, stable. Skull base and vertebrae: No fracture or suspicious bone lesion. Degenerative cystic changes in the dens are chronically noted, scattered endplate Schmorl's nodes. There is osteopenia. Narrowing and moderate spurring anterior atlantodental joint. Soft tissues and spinal canal: No prevertebral fluid or swelling. No visible canal hematoma. Disc levels: The cervical discs are diffusely degenerated except for C2-3 which maintains a normal height. Bidirectional osteophytes encroach on the cord surface at multiple levels compressing the ventral cord and causing moderate spinal canal stenosis at C3-4, C4-5, C5-6 and to a lesser extent C6-7. There is facet joint and uncinate hypertrophy throughout with multilevel foraminal stenosis most significantly at C3-4 and C4-5, and on the left at C7-T1. This was seen previously. Upper chest: There is a chronic small ground-glass infiltrate in the posterior left apex, stable. Other: Chronic nuchal ligament calcifications, C6 level. IMPRESSION: 1. No acute intracranial CT findings, depressed skull fractures, or interval changes. 2. Chronic left maxillary sinusitis. 3. Osteopenia and degenerative change of the cervical spine without evidence of fractures or interval changes. Electronically Signed   By: Telford Nab  M.D.   On: 03/19/2021 06:38   CT Cervical Spine Wo Contrast  Result Date: 03/19/2021 CLINICAL DATA:  Fall injury on blood thinners. EXAM: CT HEAD WITHOUT CONTRAST CT CERVICAL SPINE WITHOUT CONTRAST TECHNIQUE: Multidetector CT imaging of the head and cervical spine was performed following the standard protocol without intravenous contrast. Multiplanar CT image reconstructions of the cervical spine were also generated. RADIATION DOSE REDUCTION: This exam was performed according to the departmental dose-optimization program which includes automated exposure control, adjustment of the mA and/or kV according to patient size and/or use of iterative reconstruction technique. COMPARISON:  Head CT 03/08/2021, cervical spine CT 11/10/2017 FINDINGS: CT HEAD FINDINGS Brain: There is moderately developed atrophy with atrophic ventriculomegaly and relatively mild small vessel disease in the cerebral deep white matter. There is no midline shift. The cerebellum and brainstem are not well seen due to metallic artifact from dental fillings but no obvious focal abnormality is evident. There is no focal asymmetry worrisome for acute infarct, hemorrhage or mass. There are scattered calcifications in the thalami. Vascular: There calcifications in the carotid siphons but no hyperdense central vessels. Skull: Normal. Negative for fracture or focal lesion. Sinuses/Orbits: Unremarkable orbits and orbital contents. S-shaped nasal septum. Chronic left maxillary sinus opacification and hyperostosis with protrusion of the process into  the left nasal passage and calcifications in the consolidated tissue. There is patchy membrane thickening in the ethmoids. Other sinuses and the bilateral mastoid air cells are clear. Other: None. CT CERVICAL SPINE FINDINGS Alignment: Straightened lordosis is again noted with slight degenerative grade 1 anterolisthesis again at C4-5 and C5-6, stable. Skull base and vertebrae: No fracture or suspicious bone  lesion. Degenerative cystic changes in the dens are chronically noted, scattered endplate Schmorl's nodes. There is osteopenia. Narrowing and moderate spurring anterior atlantodental joint. Soft tissues and spinal canal: No prevertebral fluid or swelling. No visible canal hematoma. Disc levels: The cervical discs are diffusely degenerated except for C2-3 which maintains a normal height. Bidirectional osteophytes encroach on the cord surface at multiple levels compressing the ventral cord and causing moderate spinal canal stenosis at C3-4, C4-5, C5-6 and to a lesser extent C6-7. There is facet joint and uncinate hypertrophy throughout with multilevel foraminal stenosis most significantly at C3-4 and C4-5, and on the left at C7-T1. This was seen previously. Upper chest: There is a chronic small ground-glass infiltrate in the posterior left apex, stable. Other: Chronic nuchal ligament calcifications, C6 level. IMPRESSION: 1. No acute intracranial CT findings, depressed skull fractures, or interval changes. 2. Chronic left maxillary sinusitis. 3. Osteopenia and degenerative change of the cervical spine without evidence of fractures or interval changes. Electronically Signed   By: Telford Nab M.D.   On: 03/19/2021 06:38   DG Pelvis Portable  Result Date: 03/19/2021 CLINICAL DATA:  81 year old male with history of trauma from a fall. EXAM: PORTABLE PELVIS 1-2 VIEWS COMPARISON:  No priors. FINDINGS: There is no evidence of pelvic fracture or diastasis. No pelvic bone lesions are seen. IMPRESSION: Negative. Electronically Signed   By: Vinnie Langton M.D.   On: 03/19/2021 06:12   DG Chest Portable 1 View  Result Date: 03/19/2021 CLINICAL DATA:  81 year old male with history of trauma from a fall. EXAM: PORTABLE CHEST 1 VIEW COMPARISON:  Chest x-ray 11/11/2017. FINDINGS: Calcified pleural plaques are noted in the thorax bilaterally, indicative of asbestos related pleural disease. Regional areas of architectural  distortion are noted in the lungs bilaterally, likely reflective of some associated fibrosis, potentially asbestosis, similar to the prior examination allowing for differences in patient positioning and inspiration. No definite acute consolidative airspace disease. No pleural effusions. No pneumothorax. No evidence of pulmonary edema. Heart size is normal. Upper mediastinal contours are within normal limits. Atherosclerotic calcifications are noted in the thoracic aorta. IMPRESSION: 1. No definite radiographic evidence of significant acute traumatic injury to the thorax. 2. 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 These findings could be better evaluated with follow-up nonemergent high-resolution chest CT if clinically appropriate. 3. Aortic atherosclerosis. Electronically Signed   By: Vinnie Langton M.D.   On: 03/19/2021 06:13   CT ANGIO GI BLEED  Result Date: 03/19/2021 CLINICAL DATA:  Acute mesenteric ischemia, abdominal pain EXAM: CTA ABDOMEN AND PELVIS WITHOUT AND WITH CONTRAST TECHNIQUE: Multidetector CT imaging of the abdomen and pelvis was performed using the standard protocol during bolus administration of intravenous contrast. Multiplanar reconstructed images and MIPs were obtained and reviewed to evaluate the vascular anatomy. CONTRAST:  196mL OMNIPAQUE IOHEXOL 350 MG/ML SOLN COMPARISON:  10/22/2004 FINDINGS: VASCULAR Aorta: Moderate partially calcified atheromatous plaque particularly in the infrarenal segment. No aneurysm, dissection, or stenosis. Celiac: Calcified ostial plaque with a mild smooth stenosis, widely patent distally. SMA: Patent without evidence of aneurysm, dissection, vasculitis or significant stenosis. Renals: Duplicated left, inferior dominant, both  patent. Single right, widely patent. IMA: Patent without evidence of aneurysm, dissection, vasculitis or significant stenosis. Inflow: Mild bilateral common iliac and external iliac partially  calcified plaque, without aneurysm, dissection, or stenosis. Proximal Outflow: Bilateral common femoral and visualized portions of the superficial and profunda femoral arteries are patent without evidence of aneurysm, dissection, vasculitis or significant stenosis. Veins: No obvious venous abnormality within the limitations of this arterial phase study. Review of the MIP images confirms the above findings. NON-VASCULAR Lower chest: No pleural or pericardial effusion. Partially calcified bilateral pleural plaques. Hepatobiliary: No focal liver abnormality is seen. No gallstones, gallbladder wall thickening, or biliary dilatation. Pancreas: Unremarkable. No pancreatic ductal dilatation or surrounding inflammatory changes. Spleen: Normal in size without focal abnormality. Adrenals/Urinary Tract: Mildly heterogenous 1.6 cm right adrenal nodule measuring 92 Hounsfield units, new since previous. No renal mass or hydronephrosis. Urinary bladder is decompressed by Foley catheter. Stomach/Bowel: Stomach physiologically distended by fluid. The small bowel is decompressed, unremarkable. The colon is nondilated, unremarkable. Lymphatic: Subcentimeter left para-aortic and bilateral common iliac lymph nodes. No mesenteric adenopathy. Reproductive: Prostate enlargement Other: Bilateral pelvic phleboliths.  No ascites.  No free air. Musculoskeletal: No fracture or worrisome bone lesion. Degenerative disc disease L4-5. IMPRESSION: 1. No acute findings. No significant proximal mesenteric arterial occlusive disease. 2. 1.6 cm nonspecific right adrenal nodule, possible pheochromocytoma versus adenoma. Recommend biochemical lab evaluation for pheochromocytoma. If lab values normal, 1 year follow-up adrenal washout CT. References: JACR 2017 Aug; 14(8):1038-44, JCAT 2016 Mar-Apr; 40(2):194-200. 3. Aortoiliac  atherosclerosis (ICD10-170.0). 4. 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   On: 03/19/2021 08:30        Principal Problem:   GI bleed Active Problems:   Benign essential HTN   Dementia without behavioral disturbance (HCC)   Closed fracture distal radius and ulna, left, sequela   Leukocytosis   Renal insufficiency   Fall     LOS: 0 days   Tye Savoy ,NP 03/20/2021, 12:21 PM

## 2021-03-20 NOTE — Progress Notes (Signed)
Mobility Specialist Progress Note:   03/20/21 1045  Orthostatic Lying   BP- Lying 132/64  Orthostatic Sitting  BP- Sitting 145/72  Pulse- Sitting 52  Orthostatic Standing at 0 minutes  BP- Standing at 0 minutes 125/62  Pulse- Standing at 0 minutes 120  Orthostatic Standing at 3 minutes  BP- Standing at 3 minutes 130/63  Pulse- Standing at 3 minutes 118  Mobility  Activity Stood at bedside  Level of Assistance Modified independent, requires aide device or extra time  Assistive Device Other (Comment) (HHA)  Mobility Response Tolerated fair  Mobility performed by Mobility specialist  $Mobility charge 1 Mobility   Pt asleep on arrival. Required ModA to stand from EOB. Pt c/o dizziness throughout session. Further mobility deferred. Pt back in bed with bed alarm on.   Nelta Numbers Mobility Specialist  Phone [phone]

## 2021-03-20 NOTE — Progress Notes (Signed)
Progress Note    Matthew Knight  GUR:[id number] DOB: [birth year]  DOA: 03/19/2021 PCP: [name], MD    Brief Narrative:    Medical records reviewed and are as summarized below:  Matthew Knight is an 81 y.o. male with medical history significant of dementia, orthostatic hypotension, diabetes mellitus type 2, hepatitis, and fracture of the distal left radius and ulna secondary to fall on 03/04/2021.  He presents to the hospital after reportedly falling out of bed and per staff was noted to be unresponsive.   History is limited from the patient due to dementia and obtained initially from review of records.  EMS reported possible seizure-like activity with an episode of vomiting.  Patient had just recently been admitted in the hospital from 1/1-1/4 after presenting with left-sided weakness with reports of dizziness and a fall at home. His daughter is present at bedside and reports that yesterday he had an appointment with orthopedics in regards to his distal left radius and ulnar fracture.  During that appointment patient had a large bowel movement where his daughter noted blood present.  Anytime he has a bowel movement he complains of lower abdominal pain  Assessment/Plan:   Principal Problem:   GI bleed Active Problems:   Benign essential HTN   Dementia without behavioral disturbance (HCC)   Closed fracture distal radius and ulna, left, sequela   Leukocytosis   Renal insufficiency   Fall   Acute blood loss anemia secondary to GI bleed:  - Daughter reported patient had a large bloody bowel movement yesterday and was noted to have a large melanotic stool here in the ED.  Hemoglobin on admission 11.1 g/dL, but previously had been 13.9 on 1/2.  Suspect secondary to patient recently being placed on dual antiplatelet therapy due to concern for TIA during recent admission.  Unclear if patient has ever had a colonoscopy before. -Hold aspirin and Plavix -Appreciate [facility]  consultative services- plan for EGD/colonoscopy on monday   Fall  Syncope  orthostatic hypotension: Acute on chronic.  Patient presents after falling out of bed with reported loss of consciousness and or seizure-like activity.  Likely multifactorial nature including patient's acute blood loss.  Although prior to blood loss patient has had issues with dizziness and orthostatic hypotension that per review of his last discharge with thought possibly related with rivastigmine.  However this medication had not been discontinued during his last hospitalization. -Fall precautions -telemetry -TED hose/May benefit from wearing abdominal binder when up with PT -sleep with HOB around 20-30 degrees -needs OOB to chair for ALL meals as laying in bed will make his orthostatic issues worse   Leukocytosis  thrombocytosis: Acute.   -monitor -watch for fever   Renal insufficiency: Acute.   -resolved   Essential hypertension: Home medication regimen includes lisinopril 40 mg daily and isosorbide mononitrate 30 mg daily. -Hold BP meds for now-- with + orthostatics, may need a higher supine BP   Dementia: Present on admission.  Medication regimen includes rivastigmine patch 4.6 mg / 24-hour.  During previous hospitalization it seems that this was recommended to possibly be discontinued due to it possibly causing syncope, bradycardia, and hypotension. -Set bed alarm on -Held rivastigmine   History of TIA: Patient had just recently on 1/1 with concern for left-sided weakness to be secondary to a TIA as work-up was negative.  Patient was started on aspirin and Plavix by neurology. -ASA/plavix held   Fracture of distal radius and ulna: Prior to arrival.  Patient fell on 12/28 sustaining injuries. -Continue outpatient follow-up with orthopedic   BPH with  urinary retention:  -Patient had Foley catheter placed on 1/4 prior to being sent to SNF.   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  It had been  recommended to try a voiding trial in a couple of days, but unclear if this was ever done. - It may be best for patient to follow-up with urology as previously scheduled as they can offer such options   Hyperlipidemia -Continue pravastatin   GERD -Protonix IV as noted above    Family Communication/Anticipated D/C date and plan/Code Status   DVT prophylaxis: scd Code Status: DNR Family Communication: called daughter, no answer Disposition Plan: Status is: Observation  The patient will require care spanning > 2 midnights and should be moved to inpatient because: need plavix wash out, procedure EGD/colonoscopy        Medical Consultants:   GI    Subjective:   Sleeping, no voiced complaints  Objective:    Vitals:   03/19/21 1900 03/19/21 2000 03/19/21 2148 03/20/21 1031  BP: 123/74 (!) 133/45 (!) 153/61 132/64  Pulse: 84 71 80   Resp: 17 16 18    Temp:   98.6 F (37 C) 97.8 F (36.6 C)  TempSrc:    Oral  SpO2: 97% 96% 100% 96%  Weight:      Height:       No intake or output data in the 24 hours ending 03/20/21 1342 Filed Weights   03/19/21 0615  Weight: 99.8 kg    Exam:  General: Appearance:     Overweight male in no acute distress     Lungs:     respirations unlabored  Heart:    Normal heart rate.     MS:   All extremities are intact, left arm in brace    Neurologic:   Pleasantly confused     Data Reviewed:   I have personally reviewed following labs and imaging studies:  Labs: Labs show the following:   Basic Metabolic Panel: Recent Labs  Lab 03/19/21 0549 03/20/21 0118  NA 140 139  K 4.2 4.6  CL 107 109  CO2 22 26  GLUCOSE 136* 110*  BUN 25* 24*  CREATININE 1.38* 1.22  CALCIUM 8.7* 8.0*   GFR Estimated Creatinine Clearance: 59.1 mL/min (by C-G formula based on SCr of 1.22 mg/dL). Liver Function Tests: Recent Labs  Lab 03/19/21 0549  AST 25  ALT 22  ALKPHOS 66  BILITOT 0.7  PROT 5.9*  ALBUMIN 2.9*   Recent Labs  Lab  03/19/21 0549  LIPASE 35   No results for input(s): AMMONIA in the last 168 hours. Coagulation profile Recent Labs  Lab 03/19/21 0754  INR 1.2    CBC: Recent Labs  Lab 03/19/21 0549 03/19/21 1600 03/20/21 0118  WBC 15.3*  --  14.8*  NEUTROABS 11.5*  --   --   HGB 11.1* 10.8* 9.4*  HCT 34.9* 34.1* 28.5*  MCV 89.3  --  88.0  PLT 440*  --  436*   Cardiac Enzymes: No results for input(s): CKTOTAL, CKMB, CKMBINDEX, TROPONINI in the last 168 hours. BNP (last 3 results) No results for input(s): PROBNP in the last 8760 hours. CBG: No results for input(s): GLUCAP in the last 168 hours. D-Dimer: No results for input(s): DDIMER in the last 72 hours. Hgb A1c: No results for input(s): HGBA1C in the last 72 hours. Lipid Profile: No results for input(s): CHOL, HDL, LDLCALC,  TRIG, CHOLHDL, LDLDIRECT in the last 72 hours. Thyroid function studies: No results for input(s): TSH, T4TOTAL, T3FREE, THYROIDAB in the last 72 hours.  Invalid input(s): FREET3 Anemia work up: No results for input(s): VITAMINB12, FOLATE, FERRITIN, TIBC, IRON, RETICCTPCT in the last 72 hours. Sepsis Labs: Recent Labs  Lab 03/19/21 0549 03/20/21 0118  WBC 15.3* 14.8*    Microbiology Recent Results (from the past 240 hour(s))  Resp Panel by RT-PCR (Flu A&B, Covid) Nasopharyngeal Swab     Status: None   Collection Time: 03/19/21  5:50 AM   Specimen: Nasopharyngeal Swab; Nasopharyngeal(NP) swabs in vial transport medium  Result Value Ref Range Status   SARS Coronavirus 2 by RT PCR NEGATIVE NEGATIVE Final    Comment: (NOTE) SARS-CoV-2 target nucleic acids are NOT DETECTED.  The SARS-CoV-2 RNA is generally detectable in upper respiratory specimens during the acute phase of infection. The lowest concentration of SARS-CoV-2 viral copies this assay can detect is 138 copies/mL. A negative result does not preclude SARS-Cov-2 infection and should not be used as the sole basis for treatment or other patient  management decisions. A negative result may occur with  improper specimen collection/handling, submission of specimen other than nasopharyngeal swab, presence of viral mutation(s) within the areas targeted by this assay, and inadequate number of viral copies(<138 copies/mL). A negative result must be combined with clinical observations, patient history, and epidemiological information. The expected result is Negative.  Fact Sheet for Patients:  EntrepreneurPulse.com.au  Fact Sheet for Healthcare Providers:  IncredibleEmployment.be  This test is no t yet approved or cleared by the [place] FDA and  has been authorized for detection and/or diagnosis of SARS-CoV-2 by FDA under an Emergency Use Authorization (EUA). This EUA will remain  in effect (meaning this test can be used) for the duration of the COVID-19 declaration under Section 564(b)(1) of the Act, 21 U.S.C.section 360bbb-3(b)(1), unless the authorization is terminated  or revoked sooner.       Influenza A by PCR NEGATIVE NEGATIVE Final   Influenza B by PCR NEGATIVE NEGATIVE Final    Comment: (NOTE) The Xpert Xpress SARS-CoV-2/FLU/RSV plus assay is intended as an aid in the diagnosis of influenza from Nasopharyngeal swab specimens and should not be used as a sole basis for treatment. Nasal washings and aspirates are unacceptable for Xpert Xpress SARS-CoV-2/FLU/RSV testing.  Fact Sheet for Patients: EntrepreneurPulse.com.au  Fact Sheet for Healthcare Providers: IncredibleEmployment.be  This test is not yet approved or cleared by the [place] FDA and has been authorized for detection and/or diagnosis of SARS-CoV-2 by FDA under an Emergency Use Authorization (EUA). This EUA will remain in effect (meaning this test can be used) for the duration of the COVID-19 declaration under Section 564(b)(1) of the Act, 21 U.S.C. section 360bbb-3(b)(1),  unless the authorization is terminated or revoked.  Performed at [facility], [place] [address] [place], [place] [address]     Procedures and diagnostic studies:  CT Head Wo Contrast  Result Date: 03/19/2021 CLINICAL DATA:  Fall injury on blood thinners. EXAM: CT HEAD WITHOUT CONTRAST CT CERVICAL SPINE WITHOUT CONTRAST TECHNIQUE: Multidetector CT imaging of the head and cervical spine was performed following the standard protocol without intravenous contrast. Multiplanar CT image reconstructions of the cervical spine were also generated. RADIATION DOSE REDUCTION: This exam was performed according to the departmental dose-optimization program which includes automated exposure control, adjustment of the mA and/or kV according to patient size and/or use of iterative reconstruction technique. COMPARISON:  Head CT  03/08/2021, cervical spine CT 11/10/2017 FINDINGS: CT HEAD FINDINGS Brain: There is moderately developed atrophy with atrophic ventriculomegaly and relatively mild small vessel disease in the cerebral deep white matter. There is no midline shift. The cerebellum and brainstem are not well seen due to metallic artifact from dental fillings but no obvious focal abnormality is evident. There is no focal asymmetry worrisome for acute infarct, hemorrhage or mass. There are scattered calcifications in the thalami. Vascular: There calcifications in the carotid siphons but no hyperdense central vessels. Skull: Normal. Negative for fracture or focal lesion. Sinuses/Orbits: Unremarkable orbits and orbital contents. S-shaped nasal septum. Chronic left maxillary sinus opacification and hyperostosis with protrusion of the process into the left nasal passage and calcifications in the consolidated tissue. There is patchy membrane thickening in the ethmoids. Other sinuses and the bilateral mastoid air cells are clear. Other: None. CT CERVICAL SPINE FINDINGS Alignment: Straightened lordosis is again noted with  slight degenerative grade 1 anterolisthesis again at C4-5 and C5-6, stable. Skull base and vertebrae: No fracture or suspicious bone lesion. Degenerative cystic changes in the dens are chronically noted, scattered endplate Schmorl's nodes. There is osteopenia. Narrowing and moderate spurring anterior atlantodental joint. Soft tissues and spinal canal: No prevertebral fluid or swelling. No visible canal hematoma. Disc levels: The cervical discs are diffusely degenerated except for C2-3 which maintains a normal height. Bidirectional osteophytes encroach on the cord surface at multiple levels compressing the ventral cord and causing moderate spinal canal stenosis at C3-4, C4-5, C5-6 and to a lesser extent C6-7. There is facet joint and uncinate hypertrophy throughout with multilevel foraminal stenosis most significantly at C3-4 and C4-5, and on the left at C7-T1. This was seen previously. Upper chest: There is a chronic small ground-glass infiltrate in the posterior left apex, stable. Other: Chronic nuchal ligament calcifications, C6 level. IMPRESSION: 1. No acute intracranial CT findings, depressed skull fractures, or interval changes. 2. Chronic left maxillary sinusitis. 3. Osteopenia and degenerative change of the cervical spine without evidence of fractures or interval changes. Electronically Signed   By: Telford Nab M.D.   On: 03/19/2021 06:38   CT Cervical Spine Wo Contrast  Result Date: 03/19/2021 CLINICAL DATA:  Fall injury on blood thinners. EXAM: CT HEAD WITHOUT CONTRAST CT CERVICAL SPINE WITHOUT CONTRAST TECHNIQUE: Multidetector CT imaging of the head and cervical spine was performed following the standard protocol without intravenous contrast. Multiplanar CT image reconstructions of the cervical spine were also generated. RADIATION DOSE REDUCTION: This exam was performed according to the departmental dose-optimization program which includes automated exposure control, adjustment of the mA and/or kV  according to patient size and/or use of iterative reconstruction technique. COMPARISON:  Head CT 03/08/2021, cervical spine CT 11/10/2017 FINDINGS: CT HEAD FINDINGS Brain: There is moderately developed atrophy with atrophic ventriculomegaly and relatively mild small vessel disease in the cerebral deep white matter. There is no midline shift. The cerebellum and brainstem are not well seen due to metallic artifact from dental fillings but no obvious focal abnormality is evident. There is no focal asymmetry worrisome for acute infarct, hemorrhage or mass. There are scattered calcifications in the thalami. Vascular: There calcifications in the carotid siphons but no hyperdense central vessels. Skull: Normal. Negative for fracture or focal lesion. Sinuses/Orbits: Unremarkable orbits and orbital contents. S-shaped nasal septum. Chronic left maxillary sinus opacification and hyperostosis with protrusion of the process into the left nasal passage and calcifications in the consolidated tissue. There is patchy membrane thickening in the ethmoids. Other sinuses and the  bilateral mastoid air cells are clear. Other: None. CT CERVICAL SPINE FINDINGS Alignment: Straightened lordosis is again noted with slight degenerative grade 1 anterolisthesis again at C4-5 and C5-6, stable. Skull base and vertebrae: No fracture or suspicious bone lesion. Degenerative cystic changes in the dens are chronically noted, scattered endplate Schmorl's nodes. There is osteopenia. Narrowing and moderate spurring anterior atlantodental joint. Soft tissues and spinal canal: No prevertebral fluid or swelling. No visible canal hematoma. Disc levels: The cervical discs are diffusely degenerated except for C2-3 which maintains a normal height. Bidirectional osteophytes encroach on the cord surface at multiple levels compressing the ventral cord and causing moderate spinal canal stenosis at C3-4, C4-5, C5-6 and to a lesser extent C6-7. There is facet joint and  uncinate hypertrophy throughout with multilevel foraminal stenosis most significantly at C3-4 and C4-5, and on the left at C7-T1. This was seen previously. Upper chest: There is a chronic small ground-glass infiltrate in the posterior left apex, stable. Other: Chronic nuchal ligament calcifications, C6 level. IMPRESSION: 1. No acute intracranial CT findings, depressed skull fractures, or interval changes. 2. Chronic left maxillary sinusitis. 3. Osteopenia and degenerative change of the cervical spine without evidence of fractures or interval changes. Electronically Signed   By: Telford Nab M.D.   On: 03/19/2021 06:38   DG Pelvis Portable  Result Date: 03/19/2021 CLINICAL DATA:  81 year old male with history of trauma from a fall. EXAM: PORTABLE PELVIS 1-2 VIEWS COMPARISON:  No priors. FINDINGS: There is no evidence of pelvic fracture or diastasis. No pelvic bone lesions are seen. IMPRESSION: Negative. Electronically Signed   By: Vinnie Langton M.D.   On: 03/19/2021 06:12   DG Chest Portable 1 View  Result Date: 03/19/2021 CLINICAL DATA:  81 year old male with history of trauma from a fall. EXAM: PORTABLE CHEST 1 VIEW COMPARISON:  Chest x-ray 11/11/2017. FINDINGS: Calcified pleural plaques are noted in the thorax bilaterally, indicative of asbestos related pleural disease. Regional areas of architectural distortion are noted in the lungs bilaterally, likely reflective of some associated fibrosis, potentially asbestosis, similar to the prior examination allowing for differences in patient positioning and inspiration. No definite acute consolidative airspace disease. No pleural effusions. No pneumothorax. No evidence of pulmonary edema. Heart size is normal. Upper mediastinal contours are within normal limits. Atherosclerotic calcifications are noted in the thoracic aorta. IMPRESSION: 1. No definite radiographic evidence of significant acute traumatic injury to the thorax. 2. 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 These findings could be better evaluated with follow-up nonemergent high-resolution chest CT if clinically appropriate. 3. Aortic atherosclerosis. Electronically Signed   By: Vinnie Langton M.D.   On: 03/19/2021 06:13   CT ANGIO GI BLEED  Result Date: 03/19/2021 CLINICAL DATA:  Acute mesenteric ischemia, abdominal pain EXAM: CTA ABDOMEN AND PELVIS WITHOUT AND WITH CONTRAST TECHNIQUE: Multidetector CT imaging of the abdomen and pelvis was performed using the standard protocol during bolus administration of intravenous contrast. Multiplanar reconstructed images and MIPs were obtained and reviewed to evaluate the vascular anatomy. CONTRAST:  169mL OMNIPAQUE IOHEXOL 350 MG/ML SOLN COMPARISON:  10/22/2004 FINDINGS: VASCULAR Aorta: Moderate partially calcified atheromatous plaque particularly in the infrarenal segment. No aneurysm, dissection, or stenosis. Celiac: Calcified ostial plaque with a mild smooth stenosis, widely patent distally. SMA: Patent without evidence of aneurysm, dissection, vasculitis or significant stenosis. Renals: Duplicated left, inferior dominant, both patent. Single right, widely patent. IMA: Patent without evidence of aneurysm, dissection, vasculitis or significant stenosis. Inflow: Mild bilateral common iliac and  external iliac partially calcified plaque, without aneurysm, dissection, or stenosis. Proximal Outflow: Bilateral common femoral and visualized portions of the superficial and profunda femoral arteries are patent without evidence of aneurysm, dissection, vasculitis or significant stenosis. Veins: No obvious venous abnormality within the limitations of this arterial phase study. Review of the MIP images confirms the above findings. NON-VASCULAR Lower chest: No pleural or pericardial effusion. Partially calcified bilateral pleural plaques. Hepatobiliary: No focal liver abnormality is seen. No gallstones, gallbladder wall  thickening, or biliary dilatation. Pancreas: Unremarkable. No pancreatic ductal dilatation or surrounding inflammatory changes. Spleen: Normal in size without focal abnormality. Adrenals/Urinary Tract: Mildly heterogenous 1.6 cm right adrenal nodule measuring 92 Hounsfield units, new since previous. No renal mass or hydronephrosis. Urinary bladder is decompressed by Foley catheter. Stomach/Bowel: Stomach physiologically distended by fluid. The small bowel is decompressed, unremarkable. The colon is nondilated, unremarkable. Lymphatic: Subcentimeter left para-aortic and bilateral common iliac lymph nodes. No mesenteric adenopathy. Reproductive: Prostate enlargement Other: Bilateral pelvic phleboliths.  No ascites.  No free air. Musculoskeletal: No fracture or worrisome bone lesion. Degenerative disc disease L4-5. IMPRESSION: 1. No acute findings. No significant proximal mesenteric arterial occlusive disease. 2. 1.6 cm nonspecific right adrenal nodule, possible pheochromocytoma versus adenoma. Recommend biochemical lab evaluation for pheochromocytoma. If lab values normal, 1 year follow-up adrenal washout CT. References: JACR 2017 Aug; 14(8):1038-44, JCAT 2016 Mar-Apr; 40(2):194-200. 3. Aortoiliac  atherosclerosis (ICD10-170.0). 4. 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   On: 03/19/2021 08:30    Medications:    Chlorhexidine Gluconate Cloth  6 each Topical Daily   escitalopram  20 mg Oral Daily   isosorbide mononitrate  30 mg Oral Daily   latanoprost  1 drop Both Eyes QHS   pantoprazole (PROTONIX) IV  40 mg Intravenous Q12H   pravastatin  20 mg Oral Daily   sodium chloride flush  3 mL Intravenous Q12H   Continuous Infusions:   LOS: 0 days   Geradine Girt  [facility]   How to contact the [facility] Attending or Consulting provider [place] or covering provider during after hours [place], for this patient?   Check the care team in [facility] and look for a) attending/consulting [facility] provider listed and b) the [facility] team listed Log into [email or web address] and use [place]'s universal password to access. If you do not have the password, please contact the hospital operator. Locate the [facility] provider you are looking for under [facility] and page to a number that you can be directly reached. If you still have difficulty reaching the provider, please page the [facility] (Director on Call) for the Hospitalists listed on amion for assistance.  03/20/2021, 1:42 PM

## 2021-03-21 DIAGNOSIS — K922 Gastrointestinal hemorrhage, unspecified: Secondary | ICD-10-CM | POA: Diagnosis not present

## 2021-03-21 DIAGNOSIS — F0390 Unspecified dementia without behavioral disturbance: Secondary | ICD-10-CM | POA: Diagnosis not present

## 2021-03-21 LAB — CBC
HCT: 25.9 % — ABNORMAL LOW (ref 39.0–52.0)
Hemoglobin: 8.3 g/dL — ABNORMAL LOW (ref 13.0–17.0)
MCH: 28.1 pg (ref 26.0–34.0)
MCHC: 32 g/dL (ref 30.0–36.0)
MCV: 87.8 fL (ref 80.0–100.0)
Platelets: 424 10*3/uL — ABNORMAL HIGH (ref 150–400)
RBC: 2.95 MIL/uL — ABNORMAL LOW (ref 4.22–5.81)
RDW: 12.5 % (ref 11.5–15.5)
WBC: 10.3 10*3/uL (ref 4.0–10.5)
nRBC: 0 % (ref 0.0–0.2)

## 2021-03-21 LAB — BASIC METABOLIC PANEL
Anion gap: 9 (ref 5–15)
BUN: 15 mg/dL (ref 8–23)
CO2: 25 mmol/L (ref 22–32)
Calcium: 8.4 mg/dL — ABNORMAL LOW (ref 8.9–10.3)
Chloride: 105 mmol/L (ref 98–111)
Creatinine, Ser: 1.05 mg/dL (ref 0.61–1.24)
GFR, Estimated: >60 mL/min (ref >60)
Glucose, Bld: 108 mg/dL — ABNORMAL HIGH (ref 70–99)
Potassium: 3.9 mmol/L (ref 3.5–5.1)
Sodium: 139 mmol/L (ref 135–145)

## 2021-03-21 LAB — GLUCOSE, CAPILLARY: Glucose-Capillary: 94 mg/dL (ref 70–99)

## 2021-03-21 NOTE — Progress Notes (Signed)
Progress Note    Matthew Knight  KVQ:[id number] DOB: 22-Apr-1940  DOA: 03/19/2021 PCP: [name], MD    Brief Narrative:    Medical records reviewed and are as summarized below:  Matthew Knight is an 81 y.o. male with medical history significant of dementia, orthostatic hypotension, diabetes mellitus type 2, hepatitis, and fracture of the distal left radius and ulna secondary to fall on 03/04/2021.  He presents to the hospital after reportedly falling out of bed and per staff was noted to be unresponsive.   History is limited from the patient due to dementia and obtained initially from review of records.  EMS reported possible seizure-like activity with an episode of vomiting.  Patient had just recently been admitted in the hospital from 1/1-1/4 after presenting with left-sided weakness with reports of dizziness and a fall at home. His daughter is present at bedside and reports that yesterday he had an appointment with orthopedics in regards to his distal left radius and ulnar fracture.  During that appointment patient had a large bowel movement where his daughter noted blood present.  Anytime he has a bowel movement he complains of lower abdominal pain  Assessment/Plan:   Principal Problem:   GI bleed Active Problems:   Benign essential HTN   Dementia without behavioral disturbance (HCC)   Closed fracture distal radius and ulna, left, sequela   Leukocytosis   Renal insufficiency   Fall   Acute blood loss anemia secondary to GI bleed:  - reportedly patient had a large bloody bowel movement yesterday and was noted to have a large melanotic stool here in the ED.  Hemoglobin on admission 11.1 g/dL, but previously had been 13.9 on 1/2.  Suspect secondary to patient recently being placed on dual antiplatelet therapy due to concern for TIA during recent admission.  Unclear if patient has ever had a colonoscopy before. -Hold aspirin and Plavix -Appreciate [facility]  consultative services- plan for EGD/colonoscopy on monday   Fall  Syncope  orthostatic hypotension: Acute on chronic.  Patient presents after falling out of bed with reported loss of consciousness and or seizure-like activity.  Likely multifactorial nature including patient's acute blood loss.  Although prior to blood loss patient has had issues with dizziness and orthostatic hypotension that per review of his last discharge with thought possibly related with rivastigmine.  However this medication had not been discontinued during his last hospitalization. -Fall precautions -telemetry -TED hose/May benefit from wearing abdominal binder when up with PT -sleep with HOB around 20-30 degrees -needs OOB to chair for ALL meals as laying in bed will make his orthostatic issues worse   Leukocytosis  thrombocytosis: Acute.   -resolving -watch for fever   Renal insufficiency: Acute.   -resolved   Essential hypertension: Home medication regimen includes lisinopril 40 mg daily and isosorbide mononitrate 30 mg daily. -Hold BP meds for now-- with + orthostatics, may need a higher supine BP   Dementia: Present on admission.  Medication regimen includes rivastigmine patch 4.6 mg / 24-hour.  During previous hospitalization it seems that this was recommended to possibly be discontinued due to it possibly causing syncope, bradycardia, and hypotension. -Set bed alarm on -Held rivastigmine   History of TIA: Patient had just recently on 1/1 with concern for left-sided weakness to be secondary to a TIA as work-up was negative.  Patient was started on aspirin and Plavix by neurology. -ASA/plavix held   Fracture of distal radius and ulna: Prior to arrival.  Patient fell on 12/28 sustaining injuries. -Continue outpatient follow-up with orthopedic   BPH with  urinary retention:  -Patient had Foley catheter placed on 1/4 prior to being sent to SNF.   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  It had been  recommended to try a voiding trial in a couple of days, but unclear if this was ever done. - It may be best for patient to follow-up with urology as previously scheduled as they can offer such options   Hyperlipidemia -Continue pravastatin   GERD -Protonix IV as noted above    Family Communication/Anticipated D/C date and plan/Code Status   DVT prophylaxis: scd Code Status: DNR Family Communication: called daughter Disposition Plan: Status is: inpt  The patient will require care spanning > 2 midnights and should be moved to inpatient because: need plavix wash out, procedure EGD/colonoscopy        Medical Consultants:   GI    Subjective:   Says he would rather sleep than eat  Objective:    Vitals:   03/20/21 1639 03/20/21 1957 03/21/21 0449 03/21/21 0735  BP: (!) 180/70 136/68 (!) 138/100 (!) 139/53  Pulse: 81 69 68 61  Resp:  19 18 16   Temp:  98.5 F (36.9 C) 97.9 F (36.6 C) 97.7 F (36.5 C)  TempSrc:  Oral  Oral  SpO2:  97% 98% 99%  Weight:      Height:        Intake/Output Summary (Last 24 hours) at 03/21/2021 1251 Last data filed at 03/21/2021 0453 Gross per 24 hour  Intake 240 ml  Output 1150 ml  Net -910 ml   Filed Weights   03/19/21 0615  Weight: 99.8 kg    Exam:  General: Appearance:     Overweight male in no acute distress     Lungs:     respirations unlabored  Heart:    Normal heart rate.     MS:   All extremities are intact, left arm in brace    Neurologic:   Pleasantly confused     Data Reviewed:   I have personally reviewed following labs and imaging studies:  Labs: Labs show the following:   Basic Metabolic Panel: Recent Labs  Lab 03/19/21 0549 03/20/21 0118 03/21/21 0422  NA 140 139 139  K 4.2 4.6 3.9  CL 107 109 105  CO2 22 26 25   GLUCOSE 136* 110* 108*  BUN 25* 24* 15  CREATININE 1.38* 1.22 1.05  CALCIUM 8.7* 8.0* 8.4*   GFR Estimated Creatinine Clearance: 68.7 mL/min (by C-G formula based on SCr of  1.05 mg/dL). Liver Function Tests: Recent Labs  Lab 03/19/21 0549  AST 25  ALT 22  ALKPHOS 66  BILITOT 0.7  PROT 5.9*  ALBUMIN 2.9*   Recent Labs  Lab 03/19/21 0549  LIPASE 35   No results for input(s): AMMONIA in the last 168 hours. Coagulation profile Recent Labs  Lab 03/19/21 0754  INR 1.2    CBC: Recent Labs  Lab 03/19/21 0549 03/19/21 1600 03/20/21 0118 03/21/21 0422  WBC 15.3*  --  14.8* 10.3  NEUTROABS 11.5*  --   --   --   HGB 11.1* 10.8* 9.4* 8.3*  HCT 34.9* 34.1* 28.5* 25.9*  MCV 89.3  --  88.0 87.8  PLT 440*  --  436* 424*   Cardiac Enzymes: No results for input(s): CKTOTAL, CKMB, CKMBINDEX, TROPONINI in the last 168 hours. BNP (last 3 results) No results for input(s): PROBNP in the  last 8760 hours. CBG: Recent Labs  Lab 03/21/21 0802  GLUCAP 94   D-Dimer: No results for input(s): DDIMER in the last 72 hours. Hgb A1c: No results for input(s): HGBA1C in the last 72 hours. Lipid Profile: No results for input(s): CHOL, HDL, LDLCALC, TRIG, CHOLHDL, LDLDIRECT in the last 72 hours. Thyroid function studies: No results for input(s): TSH, T4TOTAL, T3FREE, THYROIDAB in the last 72 hours.  Invalid input(s): FREET3 Anemia work up: No results for input(s): VITAMINB12, FOLATE, FERRITIN, TIBC, IRON, RETICCTPCT in the last 72 hours. Sepsis Labs: Recent Labs  Lab 03/19/21 0549 03/20/21 0118 03/21/21 0422  WBC 15.3* 14.8* 10.3    Microbiology Recent Results (from the past 240 hour(s))  Resp Panel by RT-PCR (Flu A&B, Covid) Nasopharyngeal Swab     Status: None   Collection Time: 03/19/21  5:50 AM   Specimen: Nasopharyngeal Swab; Nasopharyngeal(NP) swabs in vial transport medium  Result Value Ref Range Status   SARS Coronavirus 2 by RT PCR NEGATIVE NEGATIVE Final    Comment: (NOTE) SARS-CoV-2 target nucleic acids are NOT DETECTED.  The SARS-CoV-2 RNA is generally detectable in upper respiratory specimens during the acute phase of infection. The  lowest concentration of SARS-CoV-2 viral copies this assay can detect is 138 copies/mL. A negative result does not preclude SARS-Cov-2 infection and should not be used as the sole basis for treatment or other patient management decisions. A negative result may occur with  improper specimen collection/handling, submission of specimen other than nasopharyngeal swab, presence of viral mutation(s) within the areas targeted by this assay, and inadequate number of viral copies(<138 copies/mL). A negative result must be combined with clinical observations, patient history, and epidemiological information. The expected result is Negative.  Fact Sheet for Patients:  EntrepreneurPulse.com.au  Fact Sheet for Healthcare Providers:  IncredibleEmployment.be  This test is no t yet approved or cleared by the [place] FDA and  has been authorized for detection and/or diagnosis of SARS-CoV-2 by FDA under an Emergency Use Authorization (EUA). This EUA will remain  in effect (meaning this test can be used) for the duration of the COVID-19 declaration under Section 564(b)(1) of the Act, 21 U.S.C.section 360bbb-3(b)(1), unless the authorization is terminated  or revoked sooner.       Influenza A by PCR NEGATIVE NEGATIVE Final   Influenza B by PCR NEGATIVE NEGATIVE Final    Comment: (NOTE) The Xpert Xpress SARS-CoV-2/FLU/RSV plus assay is intended as an aid in the diagnosis of influenza from Nasopharyngeal swab specimens and should not be used as a sole basis for treatment. Nasal washings and aspirates are unacceptable for Xpert Xpress SARS-CoV-2/FLU/RSV testing.  Fact Sheet for Patients: EntrepreneurPulse.com.au  Fact Sheet for Healthcare Providers: IncredibleEmployment.be  This test is not yet approved or cleared by the [place] FDA and has been authorized for detection and/or diagnosis of SARS-CoV-2 by FDA under  an Emergency Use Authorization (EUA). This EUA will remain in effect (meaning this test can be used) for the duration of the COVID-19 declaration under Section 564(b)(1) of the Act, 21 U.S.C. section 360bbb-3(b)(1), unless the authorization is terminated or revoked.  Performed at [facility], [place] [address] [place], [place] [address]     Procedures and diagnostic studies:  No results found.  Medications:    Chlorhexidine Gluconate Cloth  6 each Topical Daily   escitalopram  20 mg Oral Daily   latanoprost  1 drop Both Eyes QHS   pantoprazole (PROTONIX) IV  40 mg Intravenous Q12H  pravastatin  20 mg Oral Daily   sodium chloride flush  3 mL Intravenous Q12H   Continuous Infusions:   LOS: 1 day   [facility]   How to contact the [facility] Attending or Consulting provider [place] or covering provider during after hours [place], for this patient?  Check the care team in [facility] and look for a) attending/consulting TRH provider listed and b) the [facility] team listed Log into [email or web address] and use [place]'s universal password to access. If you do not have the password, please contact the hospital operator. Locate the [facility] provider you are looking for under [facility] and page to a number that you can be directly reached. If you still have difficulty reaching the provider, please page the [facility] (Director on Call) for the Hospitalists listed on amion for assistance.  03/21/2021, 12:51 PM

## 2021-03-22 DIAGNOSIS — K922 Gastrointestinal hemorrhage, unspecified: Secondary | ICD-10-CM | POA: Diagnosis not present

## 2021-03-22 LAB — CBC
HCT: 28.1 % — ABNORMAL LOW (ref 39.0–52.0)
Hemoglobin: 9.2 g/dL — ABNORMAL LOW (ref 13.0–17.0)
MCH: 28.6 pg (ref 26.0–34.0)
MCHC: 32.7 g/dL (ref 30.0–36.0)
MCV: 87.3 fL (ref 80.0–100.0)
Platelets: 431 10*3/uL — ABNORMAL HIGH (ref 150–400)
RBC: 3.22 MIL/uL — ABNORMAL LOW (ref 4.22–5.81)
RDW: 12.2 % (ref 11.5–15.5)
WBC: 7.6 10*3/uL (ref 4.0–10.5)
nRBC: 0 % (ref 0.0–0.2)

## 2021-03-22 MED ORDER — PEG-KCL-NACL-NASULF-NA ASC-C 100 G PO SOLR
0.5000 | Freq: Once | ORAL | Status: AC
  Administered 2021-03-22: 100 g via ORAL
  Filled 2021-03-22: qty 1

## 2021-03-22 MED ORDER — SODIUM CHLORIDE 0.9 % IV SOLN: INTRAVENOUS | Status: DC

## 2021-03-22 MED ORDER — PEG-KCL-NACL-NASULF-NA ASC-C 100 G PO SOLR
0.5000 | Freq: Once | ORAL | Status: AC
Start: 2021-03-22 — End: 2021-03-22
  Administered 2021-03-22: 100 g via ORAL
  Filled 2021-03-22: qty 1

## 2021-03-22 MED ORDER — PEG-KCL-NACL-NASULF-NA ASC-C 100 G PO SOLR: 1.0000 | Freq: Once | ORAL | Status: DC

## 2021-03-22 NOTE — Progress Notes (Signed)
Stopped by to see patient before EGD / colonoscopy tomorrow. Marland KitchenHe is still pleasantly confused. He didn't realized colonoscopy was scheduled for tomorrow but just complete the 1st part of bowel prep.   WBC normalized 14.8 >> 7.6.  Hgb stable at 9.2 Creatinine 1.05  He is NPO after MN

## 2021-03-22 NOTE — Progress Notes (Addendum)
Progress Note    Matthew Knight  [id number] DOB: Jul 12, 1940  DOA: 03/19/2021 PCP: [name], MD    Brief Narrative:    Medical records reviewed and are as summarized below:  Matthew Knight is an 81 y.o. male with medical history significant of dementia, orthostatic hypotension, diabetes mellitus type 2, hepatitis, and fracture of the distal left radius and ulna secondary to fall on 03/04/2021.  He presents to the hospital after reportedly falling out of bed and per staff was noted to be unresponsive.   History is limited from the patient due to dementia and obtained initially from review of records.  EMS reported possible seizure-like activity with an episode of vomiting.  Patient had just recently been admitted in the hospital from 1/1-1/4 after presenting with left-sided weakness with reports of dizziness and a fall at home. His daughter is present at bedside and reports that yesterday he had an appointment with orthopedics in regards to his distal left radius and ulnar fracture.  During that appointment patient had a large bowel movement where his daughter noted blood present.  Anytime he has a bowel movement he complains of lower abdominal pain.   Assessment/Plan:   Principal Problem:   GI bleed Active Problems:   Benign essential HTN   Dementia without behavioral disturbance (HCC)   Closed fracture distal radius and ulna, left, sequela   Leukocytosis   Renal insufficiency   Fall   Acute blood loss anemia secondary to GI bleed:  - reportedly patient had a large bloody bowel movement yesterday and was noted to have a large melanotic stool here in the ED.  Hemoglobin on admission 11.1 g/dL, but previously had been 13.9 on 1/2.  Suspect secondary to patient recently being placed on dual antiplatelet therapy due to concern for TIA during recent admission.  Unclear if patient has ever had a colonoscopy before. -Hold aspirin and Plavix -Appreciate [facility]  consultative services- plan for EGD/colonoscopy on monday   Fall  Syncope  orthostatic hypotension: Acute on chronic.  Patient presents after falling out of bed with reported loss of consciousness and or seizure-like activity.  Likely multifactorial nature including patient's acute blood loss.  Although prior to blood loss patient has had issues with dizziness and orthostatic hypotension that per review of his last discharge with thought possibly related with rivastigmine.  However this medication had not been discontinued during his last hospitalization. -Fall precautions -telemetry -TED hose/May benefit from wearing abdominal binder when up with PT -sleep with HOB around 20-30 degrees -needs OOB to chair for ALL meals as laying in bed will make his orthostatic issues  -await orthostatics   Leukocytosis  thrombocytosis: Acute.   -resolving -watch for fever   Renal insufficiency: Acute.   -resolved   Essential hypertension: Home medication regimen includes lisinopril 40 mg daily and isosorbide mononitrate 30 mg daily. -Hold BP meds for now-- with + orthostatics, may need a higher supine BP   Dementia: Present on admission.  Medication regimen includes rivastigmine patch 4.6 mg / 24-hour.  During previous hospitalization it seems that this was recommended to possibly be discontinued due to it possibly causing syncope, bradycardia, and hypotension. -Set bed alarm on -Held rivastigmine   History of TIA: Patient had just recently on 1/1 with concern for left-sided weakness to be secondary to a TIA as work-up was negative.  Patient was started on aspirin and Plavix by neurology. -ASA/plavix held   Fracture of distal radius and ulna: Prior  to arrival.  Patient fell on 12/28 sustaining injuries. -Continue outpatient follow-up with orthopedic   BPH with  urinary retention:  -Patient had Foley catheter placed on 1/4 prior to being sent to SNF.   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  It  had been recommended to try a voiding trial in a couple of days, but unclear if this was ever done. - It may be best for patient to follow-up with urology as previously scheduled   Hyperlipidemia -Continue pravastatin   GERD -Protonix IV as noted above    Family Communication/Anticipated D/C date and plan/Code Status   DVT prophylaxis: scd Code Status: DNR Family Communication: called daughter 1/14 Disposition Plan: Status is: inpt  The patient will require care spanning > 2 midnights and should be moved to inpatient because: need plavix wash out, procedure EGD/colonoscopy        Medical Consultants:   GI    Subjective:   No complaints   Objective:    Vitals:   03/21/21 1605 03/21/21 1953 03/22/21 0416 03/22/21 1227  BP: 139/63 140/62 140/63 (!) 133/52  Pulse: (!) 59 62 66 (!) 59  Resp: _0 Temp: 98.1 F (36.7 C) 98.3 F (36.8 C) 97.8 F (36.6 C) 97.9 F (36.6 C)  TempSrc: Oral Oral Oral Oral  SpO2: 96% 97% 100% 98%  Weight:      Height:        Intake/Output Summary (Last 24 hours) at 03/22/2021 1255 Last data filed at 03/22/2021 1030 Gross per 24 hour  Intake 265.89 ml  Output 2150 ml  Net -1884.11 ml   Filed Weights   03/19/21 0615  Weight: 99.8 kg    Exam:   General: Appearance:     Overweight male in no acute distress     Lungs:     respirations unlabored  Heart:    Bradycardic.   MS:   All extremities are intact.    Neurologic:   pleasantly confused       Data Reviewed:   I have personally reviewed following labs and imaging studies:  Labs: Labs show the following:   Basic Metabolic Panel: Recent Labs  Lab 03/19/21 0549 03/20/21 0118 03/21/21 0422  NA 140 139 139  K 4.2 4.6 3.9  CL 107 109 105  CO2 _1 GLUCOSE 136* 110* 108*  BUN 25* 24* 15  CREATININE 1.38* 1.22 1.05  CALCIUM 8.7* 8.0* 8.4*   GFR Estimated Creatinine Clearance: 68.7 mL/min (by C-G formula based on SCr of 1.05 mg/dL). Liver  Function Tests: Recent Labs  Lab 03/19/21 0549  AST 25  ALT 22  ALKPHOS 66  BILITOT 0.7  PROT 5.9*  ALBUMIN 2.9*   Recent Labs  Lab 03/19/21 0549  LIPASE 35   No results for input(s): AMMONIA in the last 168 hours. Coagulation profile Recent Labs  Lab 03/19/21 0754  INR 1.2    CBC: Recent Labs  Lab 03/19/21 0549 03/19/21 1600 03/20/21 0118 03/21/21 0422 03/22/21 0729  WBC 15.3*  --  14.8* 10.3 7.6  NEUTROABS 11.5*  --   --   --   --   HGB 11.1* 10.8* 9.4* 8.3* 9.2*  HCT 34.9* 34.1* 28.5* 25.9* 28.1*  MCV 89.3  --  88.0 87.8 87.3  PLT 440*  --  436* 424* 431*   Cardiac Enzymes: No results for input(s): CKTOTAL, CKMB, CKMBINDEX, TROPONINI in the last 168 hours. BNP (last 3 results) No results for input(s): PROBNP  in the last 8760 hours. CBG: Recent Labs  Lab 03/21/21 0802  GLUCAP 94   D-Dimer: No results for input(s): DDIMER in the last 72 hours. Hgb A1c: No results for input(s): HGBA1C in the last 72 hours. Lipid Profile: No results for input(s): CHOL, HDL, LDLCALC, TRIG, CHOLHDL, LDLDIRECT in the last 72 hours. Thyroid function studies: No results for input(s): TSH, T4TOTAL, T3FREE, THYROIDAB in the last 72 hours.  Invalid input(s): FREET3 Anemia work up: No results for input(s): VITAMINB12, FOLATE, FERRITIN, TIBC, IRON, RETICCTPCT in the last 72 hours. Sepsis Labs: Recent Labs  Lab 03/19/21 0549 03/20/21 0118 03/21/21 0422 03/22/21 0729  WBC 15.3* 14.8* 10.3 7.6    Microbiology Recent Results (from the past 240 hour(s))  Resp Panel by RT-PCR (Flu A&B, Covid) Nasopharyngeal Swab     Status: None   Collection Time: 03/19/21  5:50 AM   Specimen: Nasopharyngeal Swab; Nasopharyngeal(NP) swabs in vial transport medium  Result Value Ref Range Status   SARS Coronavirus 2 by RT PCR NEGATIVE NEGATIVE Final    Comment: (NOTE) SARS-CoV-2 target nucleic acids are NOT DETECTED.  The SARS-CoV-2 RNA is generally detectable in upper  respiratory specimens during the acute phase of infection. The lowest concentration of SARS-CoV-2 viral copies this assay can detect is 138 copies/mL. A negative result does not preclude SARS-Cov-2 infection and should not be used as the sole basis for treatment or other patient management decisions. A negative result may occur with  improper specimen collection/handling, submission of specimen other than nasopharyngeal swab, presence of viral mutation(s) within the areas targeted by this assay, and inadequate number of viral copies(<138 copies/mL). A negative result must be combined with clinical observations, patient history, and epidemiological information. The expected result is Negative.  Fact Sheet for Patients:  EntrepreneurPulse.com.au  Fact Sheet for Healthcare Providers:  IncredibleEmployment.be  This test is no t yet approved or cleared by the [place] FDA and  has been authorized for detection and/or diagnosis of SARS-CoV-2 by FDA under an Emergency Use Authorization (EUA). This EUA will remain  in effect (meaning this test can be used) for the duration of the COVID-19 declaration under Section 564(b)(1) of the Act, 21 U.S.C.section 360bbb-3(b)(1), unless the authorization is terminated  or revoked sooner.       Influenza A by PCR NEGATIVE NEGATIVE Final   Influenza B by PCR NEGATIVE NEGATIVE Final    Comment: (NOTE) The Xpert Xpress SARS-CoV-2/FLU/RSV plus assay is intended as an aid in the diagnosis of influenza from Nasopharyngeal swab specimens and should not be used as a sole basis for treatment. Nasal washings and aspirates are unacceptable for Xpert Xpress SARS-CoV-2/FLU/RSV testing.  Fact Sheet for Patients: EntrepreneurPulse.com.au  Fact Sheet for Healthcare Providers: IncredibleEmployment.be  This test is not yet approved or cleared by the [place] FDA and has been  authorized for detection and/or diagnosis of SARS-CoV-2 by FDA under an Emergency Use Authorization (EUA). This EUA will remain in effect (meaning this test can be used) for the duration of the COVID-19 declaration under Section 564(b)(1) of the Act, 21 U.S.C. section 360bbb-3(b)(1), unless the authorization is terminated or revoked.  Performed at [facility], [place] [address], [place], [place] [address]     Procedures and diagnostic studies:  No results found.  Medications:    Chlorhexidine Gluconate Cloth  6 each Topical Daily   escitalopram  20 mg Oral Daily   latanoprost  1 drop Both Eyes QHS   pantoprazole (PROTONIX) IV  40 mg Intravenous Q12H   peg 3350 powder  0.5 kit Oral Once   And   peg 3350 powder  0.5 kit Oral Once   pravastatin  20 mg Oral Daily   sodium chloride flush  3 mL Intravenous Q12H   Continuous Infusions:   LOS: 2 days   Geradine Girt  [facility]   How to contact the [facility] Attending or Consulting provider [place] or covering provider during after hours [place], for this patient?  Check the care team in [facility] and look for a) attending/consulting TRH provider listed and b) the [facility] team listed Log into [email or web address] and use [place]'s universal password to access. If you do not have the password, please contact the hospital operator. Locate the [facility] provider you are looking for under [facility] and page to a number that you can be directly reached. If you still have difficulty reaching the provider, please page the [facility] (Director on Call) for the Hospitalists listed on amion for assistance.  03/22/2021, 12:55 PM

## 2021-03-23 DIAGNOSIS — K922 Gastrointestinal hemorrhage, unspecified: Secondary | ICD-10-CM | POA: Diagnosis not present

## 2021-03-23 DIAGNOSIS — R55 Syncope and collapse: Secondary | ICD-10-CM

## 2021-03-23 DIAGNOSIS — D72829 Elevated white blood cell count, unspecified: Secondary | ICD-10-CM | POA: Diagnosis not present

## 2021-03-23 DIAGNOSIS — K2970 Gastritis, unspecified, without bleeding: Secondary | ICD-10-CM

## 2021-03-23 DIAGNOSIS — F0390 Unspecified dementia without behavioral disturbance: Secondary | ICD-10-CM | POA: Diagnosis not present

## 2021-03-23 MED ORDER — ORAL CARE MOUTH RINSE
15.0000 mL | Freq: Two times a day (BID) | OROMUCOSAL | Status: DC
  Administered 2021-03-23 – 2021-03-25 (×5): 15 mL via OROMUCOSAL

## 2021-03-23 MED ORDER — BISACODYL 5 MG PO TBEC
10.0000 mg | DELAYED_RELEASE_TABLET | Freq: Four times a day (QID) | ORAL | Status: AC
  Administered 2021-03-23 (×2): 10 mg via ORAL
  Filled 2021-03-23 (×2): qty 2

## 2021-03-23 MED ORDER — POLYETHYLENE GLYCOL 3350 17 GM/SCOOP PO POWD
1.0000 | Freq: Once | ORAL | Status: AC
  Administered 2021-03-23: 255 g via ORAL
  Filled 2021-03-23: qty 255

## 2021-03-23 NOTE — Progress Notes (Signed)
Progress Note    Matthew Knight  XYV:[id number] DOB: January 24, 1941  DOA: 03/19/2021 PCP: [name], MD    Brief Narrative:    Medical records reviewed and are as summarized below:  Matthew Knight is an 81 y.o. male with medical history significant of dementia, orthostatic hypotension, diabetes mellitus type 2, hepatitis, and fracture of the distal left radius and ulna secondary to fall on 03/04/2021.  He presents to the hospital after reportedly falling out of bed and per staff was noted to be unresponsive.   History is limited from the patient due to dementia and obtained initially from review of records.  plan was for EGD/colonoscopy on 1/16 but it appears patient did not have a complete prep.   Assessment/Plan:   Principal Problem:   GI bleed Active Problems:   Benign essential HTN   Dementia without behavioral disturbance (HCC)   Closed fracture distal radius and ulna, left, sequela   Leukocytosis   Renal insufficiency   Fall   Acute blood loss anemia secondary to GI bleed:  - reportedly patient had a large bloody bowel movement yesterday and was noted to have a large melanotic stool here in the ED.  Hemoglobin on admission 11.1 g/dL, but previously had been 13.9 on 1/2.  Suspect secondary to patient recently being placed on dual antiplatelet therapy due to concern for TIA during recent admission.  Unclear if patient has ever had a colonoscopy before. -Hold aspirin and Plavix -Appreciate [facility] consultative services- plan for EGD/colonoscopy once prep complete   Fall  Syncope  orthostatic hypotension: Acute on chronic.  Patient presents after falling out of bed with reported loss of consciousness and or seizure-like activity.  Likely multifactorial nature including patient's acute blood loss.  Although prior to blood loss patient has had issues with dizziness and orthostatic hypotension that per review of his last discharge with thought possibly related with  rivastigmine.  However this medication had not been discontinued during his last hospitalization. -Fall precautions -telemetry -TED hose/May benefit from wearing abdominal binder when up with PT -sleep with HOB around 20-30 degrees -needs OOB to chair for ALL meals as laying in bed will make his orthostatic issues  -await orthostatics repeat ordered the last 2 days   Leukocytosis  thrombocytosis: Acute.   -resolving -watch for fever   Renal insufficiency: Acute.   -resolved   Essential hypertension: Home medication regimen includes lisinopril 40 mg daily and isosorbide mononitrate 30 mg daily. -Hold BP meds for now-- with + orthostatics, may need a higher supine BP   Dementia: Present on admission.  Medication regimen includes rivastigmine patch 4.6 mg / 24-hour.  During previous hospitalization it seems that this was recommended to possibly be discontinued due to it possibly causing syncope, bradycardia, and hypotension. -Set bed alarm on -Held rivastigmine   History of TIA: Patient had just recently on 1/1 with concern for left-sided weakness to be secondary to a TIA as work-up was negative.  Patient was started on aspirin and Plavix by neurology. -ASA/plavix held   Fracture of distal radius and ulna: Prior to arrival.  Patient fell on 12/28 sustaining injuries. -Continue outpatient follow-up with orthopedic   BPH with  urinary retention:  -Patient had Foley catheter placed on 1/4 prior to being sent to SNF.   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  It had been recommended to try a voiding trial in a couple of days, but unclear if this was ever done. - It may  be best for patient to follow-up with urology as previously scheduled   Hyperlipidemia -Continue pravastatin   GERD -Protonix IV as noted above    Family Communication/Anticipated D/C date and plan/Code Status   DVT prophylaxis: scd Code Status: DNR Family Communication: called daughter 1/14, NA on  1/16 Disposition Plan: Status is: inpt  The patient will require care spanning > 2 midnights and should be moved to inpatient because: need plavix wash out, procedure EGD/colonoscopy        Medical Consultants:   GI    Subjective:   Asking for eggs and a steak  Objective:    Vitals:   03/22/21 1227 03/22/21 1953 03/23/21 0314 03/23/21 0742  BP: (!) 133/52 (!) 151/72 (!) 148/59 (!) 142/58  Pulse: (!) 59 74 63 68  Resp: 16 18 17 16   Temp: 97.9 F (36.6 C) 98 F (36.7 C) 98 F (36.7 C) 97.6 F (36.4 C)  TempSrc: Oral Oral Oral Oral  SpO2: 98% 98% 98% 97%  Weight:      Height:        Intake/Output Summary (Last 24 hours) at 03/23/2021 1115 Last data filed at 03/23/2021 0604 Gross per 24 hour  Intake 691.77 ml  Output 850 ml  Net -158.23 ml   Filed Weights   03/19/21 0615  Weight: 99.8 kg    Exam:   General: Appearance:     Overweight male in no acute distress     Lungs:     respirations unlabored  Heart:    Normal heart rate.   MS:   All extremities are intact.    Neurologic:   pleasantly confused       Data Reviewed:   I have personally reviewed following labs and imaging studies:  Labs: Labs show the following:   Basic Metabolic Panel: Recent Labs  Lab 03/19/21 0549 03/20/21 0118 03/21/21 0422  NA 140 139 139  K 4.2 4.6 3.9  CL 107 109 105  CO2 22 26 25   GLUCOSE 136* 110* 108*  BUN 25* 24* 15  CREATININE 1.38* 1.22 1.05  CALCIUM 8.7* 8.0* 8.4*   GFR Estimated Creatinine Clearance: 68.7 mL/min (by C-G formula based on SCr of 1.05 mg/dL). Liver Function Tests: Recent Labs  Lab 03/19/21 0549  AST 25  ALT 22  ALKPHOS 66  BILITOT 0.7  PROT 5.9*  ALBUMIN 2.9*   Recent Labs  Lab 03/19/21 0549  LIPASE 35   No results for input(s): AMMONIA in the last 168 hours. Coagulation profile Recent Labs  Lab 03/19/21 0754  INR 1.2    CBC: Recent Labs  Lab 03/19/21 0549 03/19/21 1600 03/20/21 0118 03/21/21 0422  03/22/21 0729  WBC 15.3*  --  14.8* 10.3 7.6  NEUTROABS 11.5*  --   --   --   --   HGB 11.1* 10.8* 9.4* 8.3* 9.2*  HCT 34.9* 34.1* 28.5* 25.9* 28.1*  MCV 89.3  --  88.0 87.8 87.3  PLT 440*  --  436* 424* 431*   Cardiac Enzymes: No results for input(s): CKTOTAL, CKMB, CKMBINDEX, TROPONINI in the last 168 hours. BNP (last 3 results) No results for input(s): PROBNP in the last 8760 hours. CBG: Recent Labs  Lab 03/21/21 0802  GLUCAP 94   D-Dimer: No results for input(s): DDIMER in the last 72 hours. Hgb A1c: No results for input(s): HGBA1C in the last 72 hours. Lipid Profile: No results for input(s): CHOL, HDL, LDLCALC, TRIG, CHOLHDL, LDLDIRECT in the last 72 hours. Thyroid  function studies: No results for input(s): TSH, T4TOTAL, T3FREE, THYROIDAB in the last 72 hours.  Invalid input(s): FREET3 Anemia work up: No results for input(s): VITAMINB12, FOLATE, FERRITIN, TIBC, IRON, RETICCTPCT in the last 72 hours. Sepsis Labs: Recent Labs  Lab 03/19/21 0549 03/20/21 0118 03/21/21 0422 03/22/21 0729  WBC 15.3* 14.8* 10.3 7.6    Microbiology Recent Results (from the past 240 hour(s))  Resp Panel by RT-PCR (Flu A&B, Covid) Nasopharyngeal Swab     Status: None   Collection Time: 03/19/21  5:50 AM   Specimen: Nasopharyngeal Swab; Nasopharyngeal(NP) swabs in vial transport medium  Result Value Ref Range Status   SARS Coronavirus 2 by RT PCR NEGATIVE NEGATIVE Final    Comment: (NOTE) SARS-CoV-2 target nucleic acids are NOT DETECTED.  The SARS-CoV-2 RNA is generally detectable in upper respiratory specimens during the acute phase of infection. The lowest concentration of SARS-CoV-2 viral copies this assay can detect is 138 copies/mL. A negative result does not preclude SARS-Cov-2 infection and should not be used as the sole basis for treatment or other patient management decisions. A negative result may occur with  improper specimen collection/handling, submission of specimen  other than nasopharyngeal swab, presence of viral mutation(s) within the areas targeted by this assay, and inadequate number of viral copies(<138 copies/mL). A negative result must be combined with clinical observations, patient history, and epidemiological information. The expected result is Negative.  Fact Sheet for Patients:  EntrepreneurPulse.com.au  Fact Sheet for Healthcare Providers:  IncredibleEmployment.be  This test is no t yet approved or cleared by the [place] FDA and  has been authorized for detection and/or diagnosis of SARS-CoV-2 by FDA under an Emergency Use Authorization (EUA). This EUA will remain  in effect (meaning this test can be used) for the duration of the COVID-19 declaration under Section 564(b)(1) of the Act, 21 U.S.C.section 360bbb-3(b)(1), unless the authorization is terminated  or revoked sooner.       Influenza A by PCR NEGATIVE NEGATIVE Final   Influenza B by PCR NEGATIVE NEGATIVE Final    Comment: (NOTE) The Xpert Xpress SARS-CoV-2/FLU/RSV plus assay is intended as an aid in the diagnosis of influenza from Nasopharyngeal swab specimens and should not be used as a sole basis for treatment. Nasal washings and aspirates are unacceptable for Xpert Xpress SARS-CoV-2/FLU/RSV testing.  Fact Sheet for Patients: EntrepreneurPulse.com.au  Fact Sheet for Healthcare Providers: IncredibleEmployment.be  This test is not yet approved or cleared by the [place] FDA and has been authorized for detection and/or diagnosis of SARS-CoV-2 by FDA under an Emergency Use Authorization (EUA). This EUA will remain in effect (meaning this test can be used) for the duration of the COVID-19 declaration under Section 564(b)(1) of the Act, 21 U.S.C. section 360bbb-3(b)(1), unless the authorization is terminated or revoked.  Performed at [facility], [place] [address], [place],  [place] [address]     Procedures and diagnostic studies:  No results found.  Medications:    Chlorhexidine Gluconate Cloth  6 each Topical Daily   escitalopram  20 mg Oral Daily   latanoprost  1 drop Both Eyes QHS   mouth rinse  15 mL Mouth Rinse BID   pantoprazole (PROTONIX) IV  40 mg Intravenous Q12H   polyethylene glycol powder  1 Container Oral Once   pravastatin  20 mg Oral Daily   sodium chloride flush  3 mL Intravenous Q12H   Continuous Infusions:  sodium chloride 20 mL/hr at 03/22/21 2039  LOS: 3 days   Geradine Girt  [facility]   How to contact the [facility] Attending or Consulting provider [place] or covering provider during after hours [place], for this patient?  Check the care team in [facility] and look for a) attending/consulting TRH provider listed and b) the [facility] team listed Log into [email or web address] and use [place]'s universal password to access. If you do not have the password, please contact the hospital operator. Locate the [facility] provider you are looking for under [facility] and page to a number that you can be directly reached. If you still have difficulty reaching the provider, please page the [facility] (Director on Call) for the Hospitalists listed on amion for assistance.  03/23/2021, 11:15 AM

## 2021-03-23 NOTE — Progress Notes (Signed)
Attempted to get the pt to drink  the prep and he only tooks sips before saying "I cant drink that stuff no more".  I will continue to enc pt to drink as much of the prep as he can

## 2021-03-23 NOTE — Progress Notes (Addendum)
Daily Rounding Note  03/23/2021, 10:35 AM  LOS: 3 days   SUBJECTIVE:   Chief complaint: Reported bleeding per rectum at home.    No bleeding yesterday associated with bowel prep.  Still having mushy brown stool this morning so colonoscopy and EGD rescheduled for tomorrow morning. Patient denies any discomfort, nausea, pain.   OBJECTIVE:         Vital signs in last 24 hours:    Temp:  [97.6 F (36.4 C)-98 F (36.7 C)] 97.6 F (36.4 C) (01/16 0742) Pulse Rate:  [59-74] 68 (01/16 0742) Resp:  [16-18] 16 (01/16 0742) BP: (133-151)/(52-72) 142/58 (01/16 0742) SpO2:  [97 %-98 %] 97 % (01/16 0742) Last BM Date: 03/20/21 Filed Weights   03/19/21 0615  Weight: 99.8 kg   General: Alert, NAD.  Does not look ill. Heart: RRR. Chest: Clear in front.  No labored breathing. Abdomen: Soft without tenderness.  Active bowel sounds.  No distention. Extremities: No CCE. Neuro/Psych: Hard of hearing especially on the left.  Oriented x self and place.  Questions and answers are appropriate..  Moves all 4 limbs.  No gross deficits or tremors.  Intake/Output from previous day: 01/15 0701 - 01/16 0700 In: 691.8 [P.O.:630; I.V.:61.8] Out: 1200 [Urine:1200]  Intake/Output this shift: No intake/output data recorded.  Lab Results: Recent Labs    03/21/21 0422 03/22/21 0729  WBC 10.3 7.6  HGB 8.3* 9.2*  HCT 25.9* 28.1*  PLT 424* 431*   BMET Recent Labs    03/21/21 0422  NA 139  K 3.9  CL 105  CO2 25  GLUCOSE 108*  BUN 15  CREATININE 1.05  CALCIUM 8.4*   LFT No results for input(s): PROT, ALBUMIN, AST, ALT, ALKPHOS, BILITOT, BILIDIR, IBILI in the last 72 hours. PT/INR No results for input(s): LABPROT, INR in the last 72 hours. Hepatitis Panel No results for input(s): HEPBSAG, HCVAB, HEPAIGM, HEPBIGM in the last 72 hours.  Studies/Results: No results found.  Scheduled Meds:  Chlorhexidine Gluconate Cloth  6 each  Topical Daily   escitalopram  20 mg Oral Daily   latanoprost  1 drop Both Eyes QHS   mouth rinse  15 mL Mouth Rinse BID   pantoprazole (PROTONIX) IV  40 mg Intravenous Q12H   polyethylene glycol powder  1 Container Oral Once   pravastatin  20 mg Oral Daily   sodium chloride flush  3 mL Intravenous Q12H   Continuous Infusions:  sodium chloride 20 mL/hr at 03/22/21 2039   PRN Meds:.acetaminophen **OR** acetaminophen, albuterol, meclizine, ondansetron **OR** ondansetron (ZOFRAN) IV   ASSESMENT:   GI bleeding, reported BRB PR.    Blood loss anemia.  Chronic Plavix, on DAPT (Plavix).  Last Plavix was 1/11    Dementia.  No acute delerium currently.     PLAN   Colonoscopy and upper endoscopy planned for today have been postponed until tomorrow at 0730.   Give duclolax now and gatorade/miralax split does prep this PM.      Azucena Freed  03/23/2021, 10:35 AM Phone [phone]    Attending physician's note   I have taken a history, reviewed the chart and examined the patient. I performed a substantive portion of this encounter, including complete performance of at least one of the key components, in conjunction with the APP. I agree with the APP's note, impression and recommendations.   Patient was unable to complete the bowel prep, will plan to reschedule  EGD  and colonoscopy tomorrow Monitor hemoglobin and transfuse if below 7   The patient was provided an opportunity to ask questions and all were answered. The patient agreed with the plan and demonstrated an understanding of the instructions.   Damaris Hippo , MD [phone]

## 2021-03-23 NOTE — Progress Notes (Signed)
Mobility Specialist Progress Note:   03/23/21 1230  Mobility  Activity Ambulated in hall  Level of Assistance Minimal assist, patient does 75% or more  Assistive Device Front wheel walker  Distance Ambulated (ft) 100 ft  Mobility Ambulated with assistance in hallway  Mobility Response Tolerated well  Mobility performed by Mobility specialist  $Mobility charge 1 Mobility   Pt eager for OOB mobility. Required HHA to get EOB, otherwise no physical assist required. No c/o dizziness, BP stable. Pt back in bed with bed alarm on.   Nelta Numbers Mobility Specialist  Phone [phone]

## 2021-03-23 NOTE — H&P (View-Only) (Signed)
Daily Rounding Note  03/23/2021, 10:35 AM  LOS: 3 days   SUBJECTIVE:   Chief complaint: Reported bleeding per rectum at home.    No bleeding yesterday associated with bowel prep.  Still having mushy brown stool this morning so colonoscopy and EGD rescheduled for tomorrow morning. Patient denies any discomfort, nausea, pain.   OBJECTIVE:         Vital signs in last 24 hours:    Temp:  [97.6 F (36.4 C)-98 F (36.7 C)] 97.6 F (36.4 C) (01/16 0742) Pulse Rate:  [59-74] 68 (01/16 0742) Resp:  [16-18] 16 (01/16 0742) BP: (133-151)/(52-72) 142/58 (01/16 0742) SpO2:  [97 %-98 %] 97 % (01/16 0742) Last BM Date: 03/20/21 Filed Weights   03/19/21 0615  Weight: 99.8 kg   General: Alert, NAD.  Does not look ill. Heart: RRR. Chest: Clear in front.  No labored breathing. Abdomen: Soft without tenderness.  Active bowel sounds.  No distention. Extremities: No CCE. Neuro/Psych: Hard of hearing especially on the left.  Oriented x self and place.  Questions and answers are appropriate..  Moves all 4 limbs.  No gross deficits or tremors.  Intake/Output from previous day: 01/15 0701 - 01/16 0700 In: 691.8 [P.O.:630; I.V.:61.8] Out: 1200 [Urine:1200]  Intake/Output this shift: No intake/output data recorded.  Lab Results: Recent Labs    03/21/21 0422 03/22/21 0729  WBC 10.3 7.6  HGB 8.3* 9.2*  HCT 25.9* 28.1*  PLT 424* 431*   BMET Recent Labs    03/21/21 0422  NA 139  K 3.9  CL 105  CO2 25  GLUCOSE 108*  BUN 15  CREATININE 1.05  CALCIUM 8.4*   LFT No results for input(s): PROT, ALBUMIN, AST, ALT, ALKPHOS, BILITOT, BILIDIR, IBILI in the last 72 hours. PT/INR No results for input(s): LABPROT, INR in the last 72 hours. Hepatitis Panel No results for input(s): HEPBSAG, HCVAB, HEPAIGM, HEPBIGM in the last 72 hours.  Studies/Results: No results found.  Scheduled Meds:  Chlorhexidine Gluconate Cloth  6 each  Topical Daily   escitalopram  20 mg Oral Daily   latanoprost  1 drop Both Eyes QHS   mouth rinse  15 mL Mouth Rinse BID   pantoprazole (PROTONIX) IV  40 mg Intravenous Q12H   polyethylene glycol powder  1 Container Oral Once   pravastatin  20 mg Oral Daily   sodium chloride flush  3 mL Intravenous Q12H   Continuous Infusions:  sodium chloride 20 mL/hr at 03/22/21 2039   PRN Meds:.acetaminophen **OR** acetaminophen, albuterol, meclizine, ondansetron **OR** ondansetron (ZOFRAN) IV   ASSESMENT:   GI bleeding, reported BRB PR.    Blood loss anemia.  Chronic Plavix, on DAPT (Plavix).  Last Plavix was 1/11    Dementia.  No acute delerium currently.     PLAN   Colonoscopy and upper endoscopy planned for today have been postponed until tomorrow at 0730.   Give duclolax now and gatorade/miralax split does prep this PM.      Azucena Freed  03/23/2021, 10:35 AM Phone [phone]    Attending physician's note   I have taken a history, reviewed the chart and examined the patient. I performed a substantive portion of this encounter, including complete performance of at least one of the key components, in conjunction with the APP. I agree with the APP's note, impression and recommendations.   Patient was unable to complete the bowel prep, will plan to reschedule  EGD  and colonoscopy tomorrow Monitor hemoglobin and transfuse if below 7   The patient was provided an opportunity to ask questions and all were answered. The patient agreed with the plan and demonstrated an understanding of the instructions.   Damaris Hippo , MD [phone]

## 2021-03-23 NOTE — Progress Notes (Signed)
°  Transition of Care [facility]) Screening Note   Patient Details  Name: Matthew Knight Date of Birth: 1940-12-27   Transition of Care [facility]) CM/SW Contact:    Dawayne Patricia, RN Phone Number: 03/23/2021, 2:56 PM    Transition of Care Department [facility]) has reviewed patient and no TOC needs have been identified at this time. Pt from [facility]. We will continue to monitor patient advancement through interdisciplinary progression rounds. If new patient transition needs arise, please place a TOC consult.

## 2021-03-23 NOTE — Care Management Important Message (Signed)
Important Message  Patient Details  Name: Matthew Knight MRN: [id number] Date of Birth: December 26, 1940   Medicare Important Message Given:  Yes     Memory Argue 03/23/2021, 3:34 PM

## 2021-03-23 NOTE — Progress Notes (Addendum)
Pt is enc to drink the prep for his test this am.  He is very trying very hard to drink the remaining of the prep but says it tastes so bad and he is having a hard time drinking it.  I enter the room every 15 min for the pt to try to take the prep,  he takes a swallow or two each time. We are hoping that he will be able to finish the prep soon.  The pt stools are mushy with blood seen in it.

## 2021-03-24 ENCOUNTER — Inpatient Hospital Stay (HOSPITAL_COMMUNITY): Payer: Medicare Other | Admitting: Certified Registered Nurse Anesthetist

## 2021-03-24 ENCOUNTER — Encounter (HOSPITAL_COMMUNITY): Payer: Self-pay | Admitting: Internal Medicine

## 2021-03-24 ENCOUNTER — Encounter (HOSPITAL_COMMUNITY)
Admission: EM | Disposition: A | Discharge status: Skilled Nursing Facility | Payer: Self-pay | Source: Skilled Nursing Facility | Attending: Internal Medicine

## 2021-03-24 DIAGNOSIS — K3189 Other diseases of stomach and duodenum: Secondary | ICD-10-CM

## 2021-03-24 DIAGNOSIS — D649 Anemia, unspecified: Secondary | ICD-10-CM

## 2021-03-24 DIAGNOSIS — D500 Iron deficiency anemia secondary to blood loss (chronic): Secondary | ICD-10-CM

## 2021-03-24 DIAGNOSIS — D123 Benign neoplasm of transverse colon: Secondary | ICD-10-CM

## 2021-03-24 DIAGNOSIS — K2971 Gastritis, unspecified, with bleeding: Secondary | ICD-10-CM | POA: Diagnosis not present

## 2021-03-24 DIAGNOSIS — F0390 Unspecified dementia without behavioral disturbance: Secondary | ICD-10-CM | POA: Diagnosis not present

## 2021-03-24 DIAGNOSIS — D122 Benign neoplasm of ascending colon: Secondary | ICD-10-CM

## 2021-03-24 HISTORY — PX: HEMOSTASIS CLIP PLACEMENT: SHX6857

## 2021-03-24 HISTORY — PX: POLYPECTOMY: SHX5525

## 2021-03-24 HISTORY — PX: ESOPHAGOGASTRODUODENOSCOPY (EGD) WITH PROPOFOL: SHX5813

## 2021-03-24 HISTORY — PX: COLONOSCOPY WITH PROPOFOL: SHX5780

## 2021-03-24 HISTORY — PX: HOT HEMOSTASIS: SHX5433

## 2021-03-24 LAB — BASIC METABOLIC PANEL
Anion gap: 10 (ref 5–15)
BUN: 10 mg/dL (ref 8–23)
CO2: 25 mmol/L (ref 22–32)
Calcium: 9.1 mg/dL (ref 8.9–10.3)
Chloride: 107 mmol/L (ref 98–111)
Creatinine, Ser: 1.07 mg/dL (ref 0.61–1.24)
GFR, Estimated: >60 mL/min (ref >60)
Glucose, Bld: 108 mg/dL — ABNORMAL HIGH (ref 70–99)
Potassium: 3.8 mmol/L (ref 3.5–5.1)
Sodium: 142 mmol/L (ref 135–145)

## 2021-03-24 LAB — CBC
HCT: 29.3 % — ABNORMAL LOW (ref 39.0–52.0)
Hemoglobin: 9.7 g/dL — ABNORMAL LOW (ref 13.0–17.0)
MCH: 28.1 pg (ref 26.0–34.0)
MCHC: 33.1 g/dL (ref 30.0–36.0)
MCV: 84.9 fL (ref 80.0–100.0)
Platelets: 588 10*3/uL — ABNORMAL HIGH (ref 150–400)
RBC: 3.45 MIL/uL — ABNORMAL LOW (ref 4.22–5.81)
RDW: 12.4 % (ref 11.5–15.5)
WBC: 10.2 10*3/uL (ref 4.0–10.5)
nRBC: 0 % (ref 0.0–0.2)

## 2021-03-24 LAB — IRON AND TIBC
Iron: 22 ug/dL — ABNORMAL LOW (ref 45–182)
Saturation Ratios: 5 % — ABNORMAL LOW (ref 17.9–39.5)
TIBC: 438 ug/dL (ref 250–450)
UIBC: 416 ug/dL

## 2021-03-24 LAB — FERRITIN: Ferritin: 15 ng/mL — ABNORMAL LOW (ref 24–336)

## 2021-03-24 LAB — GLUCOSE, CAPILLARY: Glucose-Capillary: 102 mg/dL — ABNORMAL HIGH (ref 70–99)

## 2021-03-24 SURGERY — ESOPHAGOGASTRODUODENOSCOPY (EGD) WITH PROPOFOL
Anesthesia: Monitor Anesthesia Care

## 2021-03-24 MED ORDER — PANTOPRAZOLE SODIUM 40 MG PO TBEC
40.0000 mg | DELAYED_RELEASE_TABLET | Freq: Every day | ORAL | Status: DC
  Administered 2021-03-24 – 2021-03-25 (×2): 40 mg via ORAL
  Filled 2021-03-24 (×2): qty 1

## 2021-03-24 MED ORDER — PROPOFOL 10 MG/ML IV BOLUS
INTRAVENOUS | Status: DC | PRN
  Administered 2021-03-24: 10 mg via INTRAVENOUS
  Administered 2021-03-24 (×2): 20 mg via INTRAVENOUS

## 2021-03-24 MED ORDER — SODIUM CHLORIDE 0.9 % IV SOLN
510.0000 mg | Freq: Once | INTRAVENOUS | Status: AC
  Administered 2021-03-24: 510 mg via INTRAVENOUS
  Filled 2021-03-24: qty 17

## 2021-03-24 MED ORDER — PROPOFOL 500 MG/50ML IV EMUL
INTRAVENOUS | Status: DC | PRN
  Administered 2021-03-24: 100 ug/kg/min via INTRAVENOUS

## 2021-03-24 MED ORDER — LIDOCAINE 2% (20 MG/ML) 5 ML SYRINGE
INTRAMUSCULAR | Status: DC | PRN
  Administered 2021-03-24: 80 mg via INTRAVENOUS

## 2021-03-24 MED ORDER — ASPIRIN EC 81 MG PO TBEC
81.0000 mg | DELAYED_RELEASE_TABLET | Freq: Every day | ORAL | Status: DC
  Administered 2021-03-25: 81 mg via ORAL
  Filled 2021-03-24: qty 1

## 2021-03-24 MED ORDER — ESMOLOL HCL 100 MG/10ML IV SOLN
INTRAVENOUS | Status: DC | PRN
  Administered 2021-03-24: 20 mg via INTRAVENOUS

## 2021-03-24 MED ORDER — LACTATED RINGERS IV SOLN: INTRAVENOUS | Status: DC

## 2021-03-24 SURGICAL SUPPLY — 25 items

## 2021-03-24 NOTE — Op Note (Addendum)
[facility] Patient Name: Matthew Knight Procedure Date : 03/24/2021 MRN: [id number] Attending MD: Mauri Pole , MD Date of Birth: Jun 22, 1940 CSN: [id number] Age: 81 Admit Type: Inpatient Procedure:                Upper GI endoscopy Indications:              Recent gastrointestinal bleeding, Suspected upper                            gastrointestinal bleeding in patient with                            unexplained iron deficiency anemia Providers:                Mauri Pole, MD, Mariana Arn, Benetta Spar, Technician Referring MD:              Medicines:                Monitored Anesthesia Care Complications:            No immediate complications. Estimated Blood Loss:     Estimated blood loss was minimal. Procedure:                Pre-Anesthesia Assessment:                           - Prior to the procedure, a History and Physical                            was performed, and patient medications and                            allergies were reviewed. The patient's tolerance of                            previous anesthesia was also reviewed. The risks                            and benefits of the procedure and the sedation                            options and risks were discussed with the patient.                            All questions were answered, and informed consent                            was obtained. Prior Anticoagulants: The patient has                            taken no previous anticoagulant or antiplatelet                            agents. ASA  Grade Assessment: III - A patient with                            severe systemic disease. After reviewing the risks                            and benefits, the patient was deemed in                            satisfactory condition to undergo the procedure.                           After obtaining informed consent, the endoscope was                             passed under direct vision. Throughout the                            procedure, the patient's blood pressure, pulse, and                            oxygen saturations were monitored continuously. The                            [id number]-H190 ([id number]) Olympus endoscope was introduced                            through the mouth, and advanced to the second part                            of duodenum. The upper GI endoscopy was                            accomplished without difficulty. The patient                            tolerated the procedure well. All the photographs                            were not saved due to error in capture function. Scope In: Scope Out: Findings:      No gross lesions were noted in the entire esophagus.      A small hiatal hernia was present.      Patchy mild inflammation characterized by congestion (edema) and       erythema was found in the entire examined stomach.      Patchy mildly erythematous mucosa and with no stigmata of bleeding was       found in the duodenal bulb.      The cardia and gastric fundus were normal on retroflexion.      The exam was otherwise without abnormality. Impression:               - No gross lesions in esophagus.                           -  Small hiatal hernia.                           - Gastritis.                           - Erythematous duodenopathy.                           - The examination was otherwise normal.                           - No specimens collected. Recommendation:           - Patient has a contact number available for                            emergencies. The signs and symptoms of potential                            delayed complications were discussed with the                            patient. Return to normal activities tomorrow.                            Written discharge instructions were provided to the                            patient.                           - Resume previous diet.                            - Continue present medications.                           - Follow an antireflux regimen.                           - Use Protonix (pantoprazole) 40 mg PO daily. Procedure Code(s):        --- Professional ---                           [phone], Esophagogastroduodenoscopy, flexible,                            transoral; diagnostic, including collection of                            specimen(s) by brushing or washing, when performed                            (separate procedure) Diagnosis Code(s):        --- Professional ---  K44.9, Diaphragmatic hernia without obstruction or                            gangrene                           K29.70, Gastritis, unspecified, without bleeding                           K31.89, Other diseases of stomach and duodenum                           K92.2, Gastrointestinal hemorrhage, unspecified                           D50.9, Iron deficiency anemia, unspecified CPT copyright 2019 American Medical Association. All rights reserved. The codes documented in this report are preliminary and upon coder review may  be revised to meet current compliance requirements. Mauri Pole, MD 03/24/2021 8:59:52 AM This report has been signed electronically. Number of Addenda: 0

## 2021-03-24 NOTE — Interval H&P Note (Signed)
History and Physical Interval Note:  03/24/2021 7:50 AM  Matthew Knight  has presented today for surgery, with the diagnosis of Heme positive stools and acute blood loss anemia.  The various methods of treatment have been discussed with the patient and family. After consideration of risks, benefits and other options for treatment, the patient has consented to  Procedure(s): ESOPHAGOGASTRODUODENOSCOPY (EGD) WITH PROPOFOL (N/A) COLONOSCOPY WITH PROPOFOL (N/A) as a surgical intervention.  The patient's history has been reviewed, patient examined, no change in status, stable for surgery.  I have reviewed the patient's chart and labs.  Questions were answered to the patient's satisfaction.     [name]

## 2021-03-24 NOTE — Anesthesia Preprocedure Evaluation (Addendum)
Anesthesia Evaluation  Patient identified by MRN, date of birth, ID band Patient awake and Patient confused    Reviewed: Allergy & Precautions, NPO status , Patient's Chart, lab work & pertinent test results  Airway Mallampati: II  TM Distance: >3 FB Neck ROM: Full    Dental  (+) Dental Advisory Given   Pulmonary Current Smoker and Patient abstained from smoking.,    breath sounds clear to auscultation       Cardiovascular hypertension, Pt. on medications  Rhythm:Regular Rate:Normal     Neuro/Psych Dementia TIACVA    GI/Hepatic Neg liver ROS, GI bleed   Endo/Other  diabetes, Type 2  Renal/GU Renal disease     Musculoskeletal   Abdominal   Peds  Hematology  (+) Blood dyscrasia, anemia ,   Anesthesia Other Findings   Reproductive/Obstetrics                            Anesthesia Physical Anesthesia Plan  ASA: 3  Anesthesia Plan: MAC   Post-op Pain Management: Minimal or no pain anticipated   Induction:   PONV Risk Score and Plan: 0 and Propofol infusion  Airway Management Planned: Natural Airway and Nasal Cannula  Additional Equipment:   Intra-op Plan:   Post-operative Plan:   Informed Consent: I have reviewed the patients History and Physical, chart, labs and discussed the procedure including the risks, benefits and alternatives for the proposed anesthesia with the patient or authorized representative who has indicated his/her understanding and acceptance.   Patient has DNR.  Discussed DNR with power of attorney and Suspend DNR.     Plan Discussed with: CRNA  Anesthesia Plan Comments:        Anesthesia Quick Evaluation

## 2021-03-24 NOTE — Progress Notes (Signed)
Mobility Specialist Progress Note:   03/24/21 1300  Mobility  Activity Ambulated with assistance in hallway  Level of Assistance Minimal assist, patient does 75% or more  Assistive Device Front wheel walker  Distance Ambulated (ft) 220 ft  Activity Response Tolerated well  $Mobility charge 1 Mobility   Pt requiring minA to stand form EOB, min guard during ambulation. Asx during ambulation, pt back in bed with bed alarm on.   Nelta Numbers Mobility Specialist  Phone [phone]

## 2021-03-24 NOTE — Progress Notes (Signed)
Progress Note    Matthew Knight  IPJ:[id number] DOB: 1940-08-02  DOA: 03/19/2021 PCP: [name], MD    Brief Narrative:    Medical records reviewed and are as summarized below:  JUDGE DUQUE is an 81 y.o. male with medical history significant of dementia, orthostatic hypotension, diabetes mellitus type 2, hepatitis, and fracture of the distal left radius and ulna secondary to fall on 03/04/2021.  He presents to the hospital after reportedly falling out of bed and per staff was noted to be unresponsive.   History is limited from the patient due to dementia and obtained initially from review of records.  s/p EGD/colonoscopy.     Assessment/Plan:   Principal Problem:   GI bleed Active Problems:   Near syncope   Benign essential HTN   Dementia without behavioral disturbance (HCC)   Closed fracture distal radius and ulna, left, sequela   Leukocytosis   Renal insufficiency   Fall   Acute blood loss anemia secondary to GI bleed:  - reportedly patient had a large bloody bowel movement yesterday and was noted to have a large melanotic stool here in the ED.  Hemoglobin on admission 11.1 g/dL, but previously had been 13.9 on 1/2.  Suspect secondary to patient recently being placed on dual antiplatelet therapy due to concern for TIA during recent admission.  Unclear if patient has ever had a colonoscopy before. -Appreciate [facility] consultative services -s/p EGD/colonoscopy on 1/17:  EGD ok, colonoscopy: Seven colonic angioectasias. Treated with argon plasma coagulation (APC).   Fall  Syncope  orthostatic hypotension: Acute on chronic.  Patient presents after falling out of bed with reported loss of consciousness and or seizure-like activity.  Likely multifactorial nature including patient's acute blood loss.  Although prior to blood loss patient has had issues with dizziness and orthostatic hypotension that per review of his last discharge with thought possibly related  with rivastigmine.  However this medication had not been discontinued during his last hospitalization. -TED hose/May benefit from wearing abdominal binder when up with PT -sleep with HOB around 20-30 degrees -needs OOB to chair for ALL meals as laying in bed will make his orthostatic issues  -await orthostatics- have ordered daily for 3 days and still not done-- messaged today's nurse in hopes that order will be followed   Leukocytosis  thrombocytosis: Acute.   -resolving -watch for fever   Renal insufficiency: Acute.   -resolved   Essential hypertension: Home medication regimen includes lisinopril 40 mg daily and isosorbide mononitrate 30 mg daily. -Hold BP meds for now-- with + orthostatics, may need a higher supine BP   Dementia: Present on admission.  Medication regimen includes rivastigmine patch 4.6 mg / 24-hour.  During previous hospitalization it seems that this was recommended to possibly be discontinued due to it possibly causing syncope, bradycardia, and hypotension. -Set bed alarm on -Held rivastigmine   History of TIA: Patient had just recently on 1/1 with concern for left-sided weakness to be secondary to a TIA as work-up was negative.  Patient was started on aspirin and Plavix by neurology. -as patient is close to 3 weeks will plan to only continue ASA for now   Fracture of distal radius and ulna: Prior to arrival.  Patient fell on 12/28 sustaining injuries. -Continue outpatient follow-up with orthopedic   BPH with  urinary retention:  -Patient had Foley catheter placed on 1/4 prior to being sent to SNF.   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  It  had been recommended to try a voiding trial in a couple of days, but unclear if this was ever done. - It may be best for patient to follow-up with urology as previously scheduled   Hyperlipidemia -Continue pravastatin   GERD -Protonix PO    Family Communication/Anticipated D/C date and plan/Code Status   DVT  prophylaxis: scd Code Status: DNR Family Communication: called daughter 1/14, NA on 1/16 Disposition Plan: Status is: inpt  The patient will require care spanning > 2 midnights and should be moved to inpatient because: need plavix wash out, procedure EGD/colonoscopy        Medical Consultants:   GI    Subjective:   No SOB, asking for food, c/o being cold  Objective:    Vitals:   03/24/21 0920 03/24/21 0930 03/24/21 0935 03/24/21 0950  BP: (!) 131/47 (!) 152/51 (!) 152/51 (!) 170/64  Pulse: (!) 56 62 62 (!) 58  Resp: 18 15 15 17   Temp:   97.9 F (36.6 C) 98 F (36.7 C)  TempSrc:    Oral  SpO2: 100% 93% 93% 100%  Weight:      Height:        Intake/Output Summary (Last 24 hours) at 03/24/2021 1151 Last data filed at 03/24/2021 1030 Gross per 24 hour  Intake 1680 ml  Output 650 ml  Net 1030 ml   Filed Weights   03/19/21 0615  Weight: 99.8 kg    Exam:   General: Appearance:     Overweight male in no acute distress     Lungs:     respirations unlabored  Heart:    Bradycardic.   MS:   All extremities are intact.    Neurologic:   Awake, alert     Data Reviewed:   I have personally reviewed following labs and imaging studies:  Labs: Labs show the following:   Basic Metabolic Panel: Recent Labs  Lab 03/19/21 0549 03/20/21 0118 03/21/21 0422 03/24/21 0545  NA 140 139 139 142  K 4.2 4.6 3.9 3.8  CL 107 109 105 107  CO2 22 26 25 25   GLUCOSE 136* 110* 108* 108*  BUN 25* 24* 15 10  CREATININE 1.38* 1.22 1.05 1.07  CALCIUM 8.7* 8.0* 8.4* 9.1   GFR Estimated Creatinine Clearance: 67.4 mL/min (by C-G formula based on SCr of 1.07 mg/dL). Liver Function Tests: Recent Labs  Lab 03/19/21 0549  AST 25  ALT 22  ALKPHOS 66  BILITOT 0.7  PROT 5.9*  ALBUMIN 2.9*   Recent Labs  Lab 03/19/21 0549  LIPASE 35   No results for input(s): AMMONIA in the last 168 hours. Coagulation profile Recent Labs  Lab 03/19/21 0754  INR 1.2     CBC: Recent Labs  Lab 03/19/21 0549 03/19/21 1600 03/20/21 0118 03/21/21 0422 03/22/21 0729 03/24/21 0545  WBC 15.3*  --  14.8* 10.3 7.6 10.2  NEUTROABS 11.5*  --   --   --   --   --   HGB 11.1* 10.8* 9.4* 8.3* 9.2* 9.7*  HCT 34.9* 34.1* 28.5* 25.9* 28.1* 29.3*  MCV 89.3  --  88.0 87.8 87.3 84.9  PLT 440*  --  436* 424* 431* 588*   Cardiac Enzymes: No results for input(s): CKTOTAL, CKMB, CKMBINDEX, TROPONINI in the last 168 hours. BNP (last 3 results) No results for input(s): PROBNP in the last 8760 hours. CBG: Recent Labs  Lab 03/21/21 0802 03/24/21 0908  GLUCAP 94 102*   D-Dimer: No results for input(s):  DDIMER in the last 72 hours. Hgb A1c: No results for input(s): HGBA1C in the last 72 hours. Lipid Profile: No results for input(s): CHOL, HDL, LDLCALC, TRIG, CHOLHDL, LDLDIRECT in the last 72 hours. Thyroid function studies: No results for input(s): TSH, T4TOTAL, T3FREE, THYROIDAB in the last 72 hours.  Invalid input(s): FREET3 Anemia work up: Recent Labs    03/24/21 0918  FERRITIN 15*  TIBC 438  IRON 22*   Sepsis Labs: Recent Labs  Lab 03/20/21 0118 03/21/21 0422 03/22/21 0729 03/24/21 0545  WBC 14.8* 10.3 7.6 10.2    Microbiology Recent Results (from the past 240 hour(s))  Resp Panel by RT-PCR (Flu A&B, Covid) Nasopharyngeal Swab     Status: None   Collection Time: 03/19/21  5:50 AM   Specimen: Nasopharyngeal Swab; Nasopharyngeal(NP) swabs in vial transport medium  Result Value Ref Range Status   SARS Coronavirus 2 by RT PCR NEGATIVE NEGATIVE Final    Comment: (NOTE) SARS-CoV-2 target nucleic acids are NOT DETECTED.  The SARS-CoV-2 RNA is generally detectable in upper respiratory specimens during the acute phase of infection. The lowest concentration of SARS-CoV-2 viral copies this assay can detect is 138 copies/mL. A negative result does not preclude SARS-Cov-2 infection and should not be used as the sole basis for treatment or other  patient management decisions. A negative result may occur with  improper specimen collection/handling, submission of specimen other than nasopharyngeal swab, presence of viral mutation(s) within the areas targeted by this assay, and inadequate number of viral copies(<138 copies/mL). A negative result must be combined with clinical observations, patient history, and epidemiological information. The expected result is Negative.  Fact Sheet for Patients:  EntrepreneurPulse.com.au  Fact Sheet for Healthcare Providers:  IncredibleEmployment.be  This test is no t yet approved or cleared by the [place] FDA and  has been authorized for detection and/or diagnosis of SARS-CoV-2 by FDA under an Emergency Use Authorization (EUA). This EUA will remain  in effect (meaning this test can be used) for the duration of the COVID-19 declaration under Section 564(b)(1) of the Act, 21 U.S.C.section 360bbb-3(b)(1), unless the authorization is terminated  or revoked sooner.       Influenza A by PCR NEGATIVE NEGATIVE Final   Influenza B by PCR NEGATIVE NEGATIVE Final    Comment: (NOTE) The Xpert Xpress SARS-CoV-2/FLU/RSV plus assay is intended as an aid in the diagnosis of influenza from Nasopharyngeal swab specimens and should not be used as a sole basis for treatment. Nasal washings and aspirates are unacceptable for Xpert Xpress SARS-CoV-2/FLU/RSV testing.  Fact Sheet for Patients: EntrepreneurPulse.com.au  Fact Sheet for Healthcare Providers: IncredibleEmployment.be  This test is not yet approved or cleared by the [place] FDA and has been authorized for detection and/or diagnosis of SARS-CoV-2 by FDA under an Emergency Use Authorization (EUA). This EUA will remain in effect (meaning this test can be used) for the duration of the COVID-19 declaration under Section 564(b)(1) of the Act, 21 U.S.C. section  360bbb-3(b)(1), unless the authorization is terminated or revoked.  Performed at [facility], [place] [address], [place], [place] [address]     Procedures and diagnostic studies:  No results found.  Medications:    Chlorhexidine Gluconate Cloth  6 each Topical Daily   escitalopram  20 mg Oral Daily   latanoprost  1 drop Both Eyes QHS   mouth rinse  15 mL Mouth Rinse BID   pantoprazole  40 mg Oral Daily   pravastatin  20 mg Oral  Daily   sodium chloride flush  3 mL Intravenous Q12H   Continuous Infusions:  lactated ringers 10 mL/hr at 03/24/21 0750     LOS: 4 days   Geradine Girt  [facility]   How to contact the [facility] Attending or Consulting provider [place] or covering provider during after hours [place], for this patient?  Check the care team in [facility] and look for a) attending/consulting [facility] provider listed and b) the [facility] team listed Log into [email or web address] and use [place]'s universal password to access. If you do not have the password, please contact the hospital operator. Locate the [facility] provider you are looking for under [facility] and page to a number that you can be directly reached. If you still have difficulty reaching the provider, please page the [facility] (Director on Call) for the Hospitalists listed on amion for assistance.  03/24/2021, 11:51 AM

## 2021-03-24 NOTE — Op Note (Addendum)
[facility] Patient Name: Matthew Knight Procedure Date : 03/24/2021 MRN: [id number] Attending MD: Mauri Pole , MD Date of Birth: 29-Jun-1940 CSN: [id number] Age: 81 Admit Type: Inpatient Procedure:                Colonoscopy Indications:              Evaluation of unexplained GI bleeding presenting                            with fecal occult blood, Unexplained iron                            deficiency anemia Providers:                Mauri Pole, MD, Mariana Arn, Benetta Spar, Technician Referring MD:              Medicines:                Monitored Anesthesia Care Complications:            No immediate complications. Estimated Blood Loss:     Estimated blood loss was minimal. Procedure:                Pre-Anesthesia Assessment:                           - Prior to the procedure, a History and Physical                            was performed, and patient medications and                            allergies were reviewed. The patient's tolerance of                            previous anesthesia was also reviewed. The risks                            and benefits of the procedure and the sedation                            options and risks were discussed with the patient.                            All questions were answered, and informed consent                            was obtained. Prior Anticoagulants: The patient has                            taken no previous anticoagulant or antiplatelet                            agents. ASA Grade Assessment:  III - A patient with                            severe systemic disease. After reviewing the risks                            and benefits, the patient was deemed in                            satisfactory condition to undergo the procedure.                           After obtaining informed consent, the colonoscope                            was passed under direct  vision. Throughout the                            procedure, the patient's blood pressure, pulse, and                            oxygen saturations were monitored continuously. The                            PCF-190TL ([id number]) Olympus colonoscope was                            introduced through the anus and advanced to the the                            cecum, identified by appendiceal orifice and                            ileocecal valve. The colonoscopy was performed                            without difficulty. The patient tolerated the                            procedure well. The quality of the bowel                            preparation was adequate to identify polyps 6 mm                            and larger in size. The ileocecal valve,                            appendiceal orifice, and rectum were photographed.                            All the photographs were not captured or saved in  provation due to error in capture function.                            Multiple photography were automatically taken                            during use of APC Scope In: 8:09:52 AM Scope Out: 8:56:17 AM Scope Withdrawal Time: 0 hours 40 minutes 58 seconds  Total Procedure Duration: 0 hours 46 minutes 25 seconds  Findings:      The perianal and digital rectal examinations were normal.      Seven small patchy angioectasias with bleeding on contact and typical       arborization were found in the transverse colon X2, in the ascending       colon X4 and in the cecum X1. Coagulation for hemostasis using argon       plasma was successful.      A 25 mm polyp was found in the ascending colon. The polyp was       multi-lobulated. The polyp was removed with a hot snare. Resection and       retrieval were complete. To prevent bleeding after the polypectomy,       three hemostatic clips were successfully placed (MR conditional). There       was no bleeding at the end of  the procedure.      A 15 mm polyp was found in the transverse colon. The polyp was       pedunculated. The polyp was removed with a hot snare. Resection and       retrieval were complete. To prevent bleeding after the polypectomy, one       hemostatic clip was successfully placed (MR conditional). There was no       bleeding at the end of the procedure.      Two sessile polyps were found in the ascending colon. The polyps were 12       to 18 mm in size. These polyps were removed with a hot snare. Resection       and retrieval were complete.      A few sessile, non-bleeding diminutive and sub cm polyps were found in       the sigmoid colon, descending colon, transverse colon, ascending colon       and cecum. The polyps were 3 to 9 mm in size. Polyps were not removed       due to prolonged procedure time, advanced age and co-mordities and as       the sub cm polyps are unlikely cause any significant morbidity      Non-bleeding external and internal hemorrhoids were found during       retroflexion. The hemorrhoids were medium-sized.      The exam was otherwise without abnormality. Impression:               - Seven colonic angioectasias. Treated with argon                            plasma coagulation (APC).                           - One 25 mm polyp in the ascending colon, removed  with a hot snare. Resected and retrieved. Clips (MR                            conditional) were placed.                           - One 15 mm polyp in the transverse colon, removed                            with a hot snare. Resected and retrieved. Clip (MR                            conditional) was placed.                           - Two 12 to 18 mm polyps in the ascending colon,                            removed with a hot snare. Resected and retrieved.                           - A few 3 to 9 mm, non-bleeding polyps in the                            sigmoid colon, in the  descending colon, in the                            transverse colon, in the ascending colon and in the                            cecum. Not removed                           - Non-bleeding external and internal hemorrhoids.                           - The examination was otherwise normal. Recommendation:           - Resume previous diet.                           - Continue present medications.                           - Await pathology results.                           - No high dose aspirin, ibuprofen, naproxen, or                            other non-steroidal anti-inflammatory drugs.                           - Ok to restart Aspirin 81mg  daily tomorrow, please  discuss with neurology if patient needs to stay on                            Plavix long term given risk for recurrent GI bleed                            and he never had documented CVA                           - No repeat colonoscopy due to age unless has overt                            active GI hemorrhage.                           - Monitor H&H and transfuse if Hgb below 7                           - IV iron infusion, Ferraheme                           - Will need routine CBC and Iron panel monitoring                            as outpatient and IV iron infusion to prevent                            recurrent admission for anemia, will need regular                            follow up with PMD or Hematology                           - Discussed the findings and recommendations with                            his dauhter Matthew Knight in detail, all her questions                            were answered. 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 All rights reserved. The codes documented in this report are preliminary and upon coder review may  be revised to meet current compliance requirements. Mauri Pole, MD 03/24/2021 9:16:11 AM This report has been signed electronically. Number of Addenda: 0

## 2021-03-24 NOTE — Evaluation (Signed)
Physical Therapy Evaluation Patient Details Name: Matthew Knight MRN: [id number] DOB: Jan 30, 1941 Today's Date: 03/24/2021  History of Present Illness  Patient is a 81 y/o male who presents on 03/19/21 from [facility] due to falling out of bed and unresponsiveness. EMS reported possible seizure-like activity with an episode of vomiting. Recent admission 1/1-1/4 with left-sided weakness, dizziness and falls at home. Found to have GI bleed. PMH includes dementia, hepatitis, hearing loss, orthostatic hypotension, DM and fracture of the distal left radius and ulna secondary to fall on 03/04/2021.  Clinical Impression  Patient presents with baseline cognitive deficits, impaired balance and impaired mobility s/p above. Pt not the best historian so unsure of patient's PLOF. Per notes, pt was admitted to hospital beginning of January and then to [facility]. Pt has a hx of falls. Today, pt sleepy after being woken up from a nap. Tolerated transfers and gait training with Min guard assist and use of RW for support. If pt has family support at home, recommend return home with HHPT. However if no supervision/assist at home, recommend return to SNF to finish rehab stay as pt a high falls risk due to cognitive deficits. Will follow acutely to maximize independence and mobility.     Recommendations for follow up therapy are one component of a multi-disciplinary discharge planning process, led by the attending physician.  Recommendations may be updated based on patient status, additional functional criteria and insurance authorization.  Follow Up Recommendations Skilled nursing-short term rehab (<3 hours/day)    Assistance Recommended at Discharge Frequent or constant Supervision/Assistance  Patient can return home with the following  A little help with walking and/or transfers;A little help with bathing/dressing/bathroom;Assistance with cooking/housework;Direct supervision/assist for medications  management;Direct supervision/assist for financial management;Assist for transportation    Equipment Recommendations None recommended by PT  Recommendations for Other Services       Functional Status Assessment Patient has had a recent decline in their functional status and demonstrates the ability to make significant improvements in function in a reasonable and predictable amount of time.     Precautions / Restrictions Precautions Precautions: Fall;Other (comment) Precaution Comments: orthostatic hypotension per chart Required Braces or Orthoses: Splint/Cast Restrictions Weight Bearing Restrictions: No Other Position/Activity Restrictions: no specific orders for L UE weight bearing, assume through elbow only due to L wrist injury/splint      Mobility  Bed Mobility Overal bed mobility: Needs Assistance Bed Mobility: Supine to Sit     Supine to sit: Min guard, HOB elevated Sit to supine: Min guard, HOB elevated   General bed mobility comments: No assist needed.    Transfers Overall transfer level: Needs assistance Equipment used: Rolling walker (2 wheels) Transfers: Sit to/from Stand Sit to Stand: Min guard           General transfer comment: Min guard for safety. Stood from [employer].    Ambulation/Gait Ambulation/Gait assistance: Min guard Gait Distance (Feet): 50 Feet Assistive device: Rolling walker (2 wheels) Gait Pattern/deviations: Step-through pattern, Decreased stride length, Wide base of support Gait velocity: decreased     General Gait Details: Slow, steady gait with RW for support, cues for RW management.  Stairs            Wheelchair Mobility    Modified Rankin (Stroke Patients Only)       Balance Overall balance assessment: Needs assistance Sitting-balance support: Feet supported, No upper extremity supported Sitting balance-Leahy Scale: Good     Standing balance support: During functional activity  Standing balance-Leahy Scale:  Fair Standing balance comment: Needs UE support for walking                             Pertinent Vitals/Pain Pain Assessment Pain Assessment: Faces Faces Pain Scale: No hurt    Home Living Family/patient expects to be discharged to:: Private residence Living Arrangements: Alone Available Help at Discharge: Family;Available PRN/intermittently Type of Home: House Home Access: Stairs to enter Entrance Stairs-Rails: Psychiatric nurse of Steps: 6 Alternate Level Stairs-Number of Steps: 7 Home Layout: Multi-level;Bed/bath upstairs Home Equipment: Grab bars - tub/shower;Grab bars - toilet      Prior Function Prior Level of Function : Patient poor historian/Family not available             Mobility Comments: Info taken from 2 weeks ago admission, pt states he lives with his wife.       Hand Dominance   Dominant Hand: Right    Extremity/Trunk Assessment   Upper Extremity Assessment Upper Extremity Assessment: Defer to OT evaluation LUE Deficits / Details: brace on left forearm    Lower Extremity Assessment Lower Extremity Assessment: Overall WFL for tasks assessed RLE Deficits / Details: AROM WFL, strength at least 4/5 throughout LLE Deficits / Details: AROM WFL, strength 4-/5       Communication   Communication: HOH  Cognition Arousal/Alertness: Lethargic Behavior During Therapy: WFL for tasks assessed/performed Overall Cognitive Status: History of cognitive impairments - at baseline                                 General Comments: Pt with history of dementia per chart, oriented to person and place, followc ommands well, poor historian.  poor safety awareness, masks confusion with humor.        General Comments General comments (skin integrity, edema, etc.): VSS on RA.    Exercises     Assessment/Plan    PT Assessment Patient needs continued PT services  PT Problem List Decreased mobility;Decreased safety  awareness;Decreased balance;Decreased knowledge of use of DME;Decreased cognition       PT Treatment Interventions DME instruction;Therapeutic activities;Therapeutic exercise;Patient/family education;Gait training;Balance training;Functional mobility training;Cognitive remediation    PT Goals (Current goals can be found in the Care Plan section)  Acute Rehab PT Goals Patient Stated Goal: to go home and see my dog Shadow PT Goal Formulation: With patient Time For Goal Achievement: 04/07/21 Potential to Achieve Goals: Good    Frequency Min 2X/week     Co-evaluation               AM-PAC PT "6 Clicks" Mobility  Outcome Measure Help needed turning from your back to your side while in a flat bed without using bedrails?: None Help needed moving from lying on your back to sitting on the side of a flat bed without using bedrails?: A Little Help needed moving to and from a bed to a chair (including a wheelchair)?: A Little Help needed standing up from a chair using your arms (e.g., wheelchair or bedside chair)?: A Little Help needed to walk in hospital room?: A Little Help needed climbing 3-5 steps with a railing? : A Little 6 Click Score: 19    End of Session Equipment Utilized During Treatment: Gait belt Activity Tolerance: Patient tolerated treatment well Patient left: in bed;with call bell/phone within reach;with bed alarm set Nurse Communication: Mobility status PT  Visit Diagnosis: Other abnormalities of gait and mobility (R26.89);History of falling (Z91.81)    Time: 2426-8341 PT Time Calculation (min) (ACUTE ONLY): 18 min   Charges:   PT Evaluation $PT Eval Moderate Complexity: 1 Mod          Marisa Severin, PT, DPT Acute Rehabilitation Services Pager [phone] Office [phone]     Marguarite Arbour A Sabra Heck 03/24/2021, 3:46 PM

## 2021-03-24 NOTE — Transfer of Care (Signed)
Immediate Anesthesia Transfer of Care Note  Patient: Matthew Knight  Procedure(s) Performed: ESOPHAGOGASTRODUODENOSCOPY (EGD) WITH PROPOFOL COLONOSCOPY WITH PROPOFOL HOT HEMOSTASIS (ARGON PLASMA COAGULATION/BICAP) POLYPECTOMY  Patient Location: PACU  Anesthesia Type:MAC  Level of Consciousness: drowsy  Airway & Oxygen Therapy: Patient Spontanous Breathing and Patient connected to nasal cannula oxygen  Post-op Assessment: Report given to RN and Post -op Vital signs reviewed and stable  Post vital signs: Reviewed and stable  Last Vitals:  Vitals Value Taken Time  BP 109/48 03/24/21 0903  Temp    Pulse 61 03/24/21 0905  Resp 15 03/24/21 0905  SpO2 97 % 03/24/21 0905  Vitals shown include unvalidated device data.  Last Pain:  Vitals:   03/24/21 0657  TempSrc: Temporal  PainSc: 0-No pain      Patients Stated Pain Goal: 0 (78/67/54 4920)  Complications: No notable events documented.

## 2021-03-24 NOTE — Anesthesia Postprocedure Evaluation (Signed)
Anesthesia Post Note  Patient: [name]  Procedure(s) Performed: ESOPHAGOGASTRODUODENOSCOPY (EGD) WITH PROPOFOL COLONOSCOPY WITH PROPOFOL HOT HEMOSTASIS (ARGON PLASMA COAGULATION/BICAP) POLYPECTOMY     Patient location during evaluation: PACU Anesthesia Type: MAC Level of consciousness: awake and alert Pain management: pain level controlled Vital Signs Assessment: post-procedure vital signs reviewed and stable Respiratory status: spontaneous breathing, nonlabored ventilation, respiratory function stable and patient connected to nasal cannula oxygen Cardiovascular status: stable and blood pressure returned to baseline Postop Assessment: no apparent nausea or vomiting Anesthetic complications: no   No notable events documented.  Last Vitals:  Vitals:   03/24/21 0935 03/24/21 0950  BP: (!) 152/51 (!) 170/64  Pulse: 62 (!) 58  Resp: 15 17  Temp: 36.6 C 36.7 C  SpO2: 93% 100%    Last Pain:  Vitals:   03/24/21 0950  TempSrc: Oral  PainSc:                  Tiajuana Amass

## 2021-03-25 ENCOUNTER — Other Ambulatory Visit (HOSPITAL_COMMUNITY): Payer: Self-pay

## 2021-03-25 DIAGNOSIS — F0390 Unspecified dementia without behavioral disturbance: Secondary | ICD-10-CM | POA: Diagnosis not present

## 2021-03-25 DIAGNOSIS — K5520 Angiodysplasia of colon without hemorrhage: Secondary | ICD-10-CM | POA: Diagnosis not present

## 2021-03-25 DIAGNOSIS — K31811 Angiodysplasia of stomach and duodenum with bleeding: Secondary | ICD-10-CM

## 2021-03-25 DIAGNOSIS — S52502S Unspecified fracture of the lower end of left radius, sequela: Secondary | ICD-10-CM | POA: Diagnosis not present

## 2021-03-25 DIAGNOSIS — K922 Gastrointestinal hemorrhage, unspecified: Secondary | ICD-10-CM | POA: Diagnosis not present

## 2021-03-25 LAB — CBC
HCT: 29.9 % — ABNORMAL LOW (ref 39.0–52.0)
Hemoglobin: 10 g/dL — ABNORMAL LOW (ref 13.0–17.0)
MCH: 28.5 pg (ref 26.0–34.0)
MCHC: 33.4 g/dL (ref 30.0–36.0)
MCV: 85.2 fL (ref 80.0–100.0)
Platelets: 380 10*3/uL (ref 150–400)
RBC: 3.51 MIL/uL — ABNORMAL LOW (ref 4.22–5.81)
RDW: 12.5 % (ref 11.5–15.5)
WBC: 18.7 10*3/uL — ABNORMAL HIGH (ref 4.0–10.5)
nRBC: 0 % (ref 0.0–0.2)

## 2021-03-25 LAB — RESP PANEL BY RT-PCR (FLU A&B, COVID) ARPGX2
Influenza A by PCR: NEGATIVE
Influenza B by PCR: NEGATIVE
SARS Coronavirus 2 by RT PCR: NEGATIVE

## 2021-03-25 LAB — SURGICAL PATHOLOGY

## 2021-03-25 MED ORDER — OMEPRAZOLE 40 MG PO CPDR
40.0000 mg | DELAYED_RELEASE_CAPSULE | Freq: Every day | ORAL | 0 refills | Status: DC
  Filled 2021-03-25: qty 30, 30d supply, fill #0

## 2021-03-25 NOTE — NC FL2 (Signed)
[place] MEDICAID FL2 LEVEL OF CARE SCREENING TOOL     IDENTIFICATION  Patient Name: Matthew Knight Birthdate: Dec 16, 1940 Sex: male Admission Date (Current Location): 03/19/2021  [facility] and [place] Number:  Herbalist and Address:  [facility] [place], [place] [address], [place], [place] [address]      Provider Number: [id number]  Attending Physician Name and Address:  [name], MD  Relative Name and Phone Number:       Current Level of Care: Hospital Recommended Level of Care: [place] Prior Approval Number:    Date Approved/Denied:   PASRR Number: [id number]  Discharge Plan: SNF    Current Diagnoses: Patient Active Problem List   Diagnosis Date Noted   Iron deficiency anemia due to chronic blood loss    Symptomatic anemia    GI bleed 03/19/2021   Leukocytosis 03/19/2021   Renal insufficiency 03/19/2021   Fall 03/19/2021   Heme positive stool    Acute blood loss anemia    Platelet inhibition due to Plavix    Orthostatic hypotension 03/10/2021   Acute left-sided weakness 03/09/2021   Closed fracture distal radius and ulna, left, sequela 03/09/2021   TIA (transient ischemic attack) 03/09/2021   Diabetes mellitus without complication ([place])    Abnormal ECG 04/28/2020   Snoring 04/28/2020   Anxiety 11/22/2018   Dementia without behavioral disturbance ([place]) 11/22/2018   Depression 11/22/2018   Exposure to Agent [facility] 11/22/2018   Small vessel disease, 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   Dizziness 10/24/2018   Benign essential HTN 11/11/2017   Bradycardia 11/11/2017   Near syncope 11/10/2017   Onychomycosis 04/19/2017   Memory loss 01/22/2017   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   Iliac artery aneurysm, 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   Hypertension, benign 03/28/2015   Screening for colon cancer 12/03/2013   AVM (arteriovenous malformation) of colon, 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   Sensorineural hearing  loss, 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   Tobacco use disorder 07/17/2012   Vitamin D deficiency 01/14/2012   Sudden visual loss 08/17/2010   Elevated prostate specific antigen (PSA) 01/07/2010   Chest pain 02/10/2009   Skin sensation disturbance 02/10/2009   Hepatitis 01/23/2009    Orientation RESPIRATION BLADDER Height & Weight     Self, Place  Normal Incontinent, Continent (incontinent at times) Weight: 220 lb (99.8 kg) Height:  6' (182.9 cm)  BEHAVIORAL SYMPTOMS/MOOD NEUROLOGICAL BOWEL NUTRITION STATUS      Continent Diet (regular)  AMBULATORY STATUS COMMUNICATION OF NEEDS Skin   Limited Assist Verbally Normal                       Personal Care Assistance Level of Assistance  Bathing, Feeding, Dressing Bathing Assistance: Limited assistance Feeding assistance: Independent Dressing Assistance: Limited assistance     Functional Limitations Info  Sight, Hearing, Speech Sight Info: Impaired Hearing Info: Impaired Speech Info: Adequate    SPECIAL CARE FACTORS FREQUENCY  PT (By licensed PT), OT (By licensed OT), Speech therapy     PT Frequency: 5x a week OT Frequency: 5x a week            Contractures Contractures Info: Not present    Additional Factors Info  Code Status, Allergies, Insulin Sliding Scale Code Status Info: DNR Allergies Info: darunavir, sulfa Antibiotics   Insulin Sliding Scale Info: See DC summary       Current Medications (03/25/2021):  This is the current hospital active medication list Current Facility-Administered Medications  Medication Dose  Route Frequency Provider Last Rate Last Admin   acetaminophen (TYLENOL) tablet 650 mg  650 mg Oral Q6H PRN Fuller Plan A, MD   650 mg at 03/23/21 2158   Or   acetaminophen (TYLENOL) suppository 650 mg  650 mg Rectal Q6H PRN Fuller Plan A, MD       albuterol (PROVENTIL) (2.5 MG/3ML) 0.083% nebulizer solution 2.5 mg  2.5 mg Nebulization Q6H PRN Norval Morton, MD       aspirin EC tablet 81 mg  81  mg Oral Daily Vann, Jessica U, DO       Chlorhexidine Gluconate Cloth 2 % PADS 6 each  6 each Topical Daily Fuller Plan A, MD   6 each at 03/24/21 1313   escitalopram (LEXAPRO) tablet 20 mg  20 mg Oral Daily Smith, Rondell A, MD   20 mg at 03/24/21 1313   lactated ringers infusion   Intravenous Continuous Nandigam, Kavitha V, MD 10 mL/hr at 03/24/21 0750 Restarted at 03/24/21 0856   latanoprost (XALATAN) 0.005 % ophthalmic solution 1 drop  1 drop Both Eyes QHS Smith, Rondell A, MD   1 drop at 03/24/21 2136   meclizine (ANTIVERT) tablet 25 mg  25 mg Oral TID PRN Geradine Girt, DO       MEDLINE mouth rinse  15 mL Mouth Rinse BID Vann, Jessica U, DO   15 mL at 03/24/21 2136   ondansetron (ZOFRAN) tablet 4 mg  4 mg Oral Q6H PRN Fuller Plan A, MD       Or   ondansetron (ZOFRAN) injection 4 mg  4 mg Intravenous Q6H PRN Smith, Rondell A, MD       pantoprazole (PROTONIX) EC tablet 40 mg  40 mg Oral Daily Nandigam, Kavitha V, MD   40 mg at 03/24/21 1312   pravastatin (PRAVACHOL) tablet 20 mg  20 mg Oral Daily Smith, Rondell A, MD   20 mg at 03/24/21 1312   sodium chloride flush (NS) 0.9 % injection 3 mL  3 mL Intravenous Q12H Fuller Plan A, MD   3 mL at 03/24/21 2139     Discharge Medications: Please see discharge summary for a list of discharge medications.  Relevant Imaging Results:  Relevant Lab Results:   Additional Information SS#: [id number]  Emeterio Reeve, LCSW

## 2021-03-25 NOTE — Discharge Summary (Signed)
PatientPhysician Discharge Summary  Matthew Knight DGL:[id number] DOB: 06-29-1940 DOA: 03/19/2021  PCP: [name], MD  Admit date: 03/19/2021 Discharge date: 03/25/2021 30 Day Unplanned Readmission Risk Score    Flowsheet Row ED to Hosp-Admission (Current) from 03/19/2021 in [facility]  30 Day Unplanned Readmission Risk Score (%) 16.42 Filed at 03/25/2021 0801       This score is the patient's risk of an unplanned readmission within 30 days of being discharged (0 -100%). The score is based on dignosis, age, lab data, medications, orders, and past utilization.   Low:  0-14.9   Medium: 15-21.9   High: 22-29.9   Extreme: 30 and above          Admitted From: SNF Disposition: SNF  Recommendations for Outpatient Follow-up:  Follow up with PCP in 1-2 weeks Please obtain BMP/CBC in one week Follow-up with GI per their recommendations Please follow up with your PCP on the following pending results: Unresulted Labs (From admission, onward)     Start     Ordered   03/25/21 1015  CBC  ONCE - STAT,   STAT        03/25/21 1014   03/19/21 0938  Urinalysis, Routine w reflex microscopic  Once,   R        03/19/21 0942   03/19/21 0550  Urinalysis, Routine w reflex microscopic Urine, Clean Catch  ONCE - STAT,   STAT        03/19/21 0549              Home Health: None Equipment/Devices: None  Discharge Condition: Stable CODE STATUS: DNR Diet recommendation: Cardiac  Subjective: Seen and examined.  Fully alert and partly oriented, at his baseline due to dementia, has no complaints.  Brief/Interim Summary: Matthew Knight is an 81 y.o. male with medical history significant of dementia, orthostatic hypotension, diabetes mellitus type 2, hepatitis, and fracture of the distal left radius and ulna secondary to fall on 03/04/2021.  He presented to the hospital after reportedly falling out of bed and per staff was noted to be unresponsive.   Patient was eventually admitted with the following reasons.  Detailed hospitalization as below as well.   Acute blood loss anemia secondary to GI bleed:  - reportedly patient had a large bloody bowel movement at SNF and was noted to have a large melanotic stool here in the ED.  Hemoglobin on admission 11.1 g/dL, but previously had been 13.9 on 1/2.  Suspect secondary to patient recently being placed on dual antiplatelet therapy due to concern for TIA during recent admission.  GI consulted. s/p EGD/colonoscopy on 1/17:  EGD ok, colonoscopy: Seven colonic angioectasias. Treated with argon plasma coagulation (APC).  Hemoglobin stable.  GI recommended resuming aspirin but holding Plavix.  Since patient was recommended to take both DAPT for only 3 weeks due to DAPT and he is now close to 3 weeks, we have decided to discontinue his Plavix but resume aspirin.  Hemoglobin is stable and he is cleared from GI perspective for discharge.  He is being discharged to SNF today.   Fall  Syncope  orthostatic hypotension: Acute on chronic.  Patient presents after falling out of bed with reported loss of consciousness and or seizure-like activity.  Likely multifactorial nature including patient's acute blood loss.  Although prior to blood loss patient has had issues with dizziness and orthostatic hypotension that per review of his last discharge with thought possibly related  with rivastigmine however this medication was continued.  Patient has not had any dizziness or any other complaints here.   AKI: Resolved.  Essential hypertension: Negative orthostatics.  Resume home medications.   Dementia: Present on admission.  Medication regimen includes rivastigmine patch 4.6 mg / 24-hour.  During previous hospitalization it seems that this was recommended to possibly be discontinued due to it possibly causing syncope, bradycardia, and hypotension.  Rivastigmine was held during this hospitalization and is being discontinued at  discharge.   History of TIA: Patient had just recently on 1/1 with concern for left-sided weakness to be secondary to a TIA as work-up was negative.  Patient was started on aspirin and Plavix by neurology. -as patient is close to 3 weeks will plan to only continue ASA for now.   Fracture of distal radius and ulna: Prior to arrival.  Patient fell on 12/28 sustaining injuries. -Continue outpatient follow-up with orthopedic   BPH with  urinary retention:  -Patient had Foley catheter placed on 1/4 prior to being sent to SNF.   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  It had been recommended to try a voiding trial in a couple of days, but unclear if this was ever done.  Follow-up with urology for voiding trial.   Hyperlipidemia -Continue pravastatin   GERD -Protonix PO    Discharge plan was discussed with patient and/or family member and they verbalized understanding and agreed with it.  Discharge Diagnoses:  Principal Problem:   GI bleed Active Problems:   Near syncope   Benign essential HTN   AVM (arteriovenous malformation) of colon, acquired   Dementia without behavioral disturbance (HCC)   Closed fracture distal radius and ulna, left, sequela   Leukocytosis   Renal insufficiency   Fall   Iron deficiency anemia due to chronic blood loss   Symptomatic anemia    Discharge Instructions   Allergies as of 03/25/2021       Reactions   Darunavir Other (See Comments)   Other reaction(s): Eruption of skin   Sulfa Antibiotics    Other reaction(s): Other (See Comments) States made him crazy        Medication List     STOP taking these medications    clopidogrel 75 MG tablet Commonly known as: PLAVIX       TAKE these medications    acetaminophen 325 MG tablet Commonly known as: TYLENOL Take 2 tablets (650 mg total) by mouth every 4 (four) hours as needed for mild pain, moderate pain, fever or headache.   aspirin EC 81 MG tablet Take 81 mg by mouth daily.    escitalopram 20 MG tablet Commonly known as: LEXAPRO Take 20 mg by mouth daily.   isosorbide mononitrate 30 MG 24 hr tablet Commonly known as: IMDUR Take 30 mg by mouth daily.   latanoprost 0.005 % ophthalmic solution Commonly known as: XALATAN Place 1 drop into both eyes at bedtime.   lisinopril 40 MG tablet Commonly known as: ZESTRIL Take 40 mg by mouth daily.   meclizine 25 MG tablet Commonly known as: ANTIVERT Take 25 mg by mouth 3 (three) times daily as needed for dizziness.   omeprazole 40 MG capsule Commonly known as: PRILOSEC Take 1 capsule (40 mg total) by mouth daily. What changed:  medication strength how much to take   pravastatin 20 MG tablet Commonly known as: PRAVACHOL Take 20 mg by mouth daily.   Rhopressa 0.02 % Soln Generic drug: Netarsudil Dimesylate Place 1 drop into both  eyes daily at 6 (six) AM.   rivastigmine 4.6 mg/24hr Commonly known as: EXELON Place 4.6 mg onto the skin daily.        Follow-up Information     [name], MD Follow up in 1 week(s).   Specialty: Family Medicine Contact information: [address] [place] [address] [phone]                Allergies  Allergen Reactions   Darunavir Other (See Comments)    Other reaction(s): Eruption of skin   Sulfa Antibiotics     Other reaction(s): Other (See Comments) States made him crazy    Consultations: GI   Procedures/Studies: DG Wrist Complete Left  Result Date: 03/08/2021 CLINICAL DATA:  Fall. EXAM: LEFT WRIST - COMPLETE 3+ VIEW COMPARISON:  Left wrist x-ray 03/04/2021. FINDINGS: There is a new splint. Comminuted distal radius fracture with intra-articular extension is again seen. Alignment is near anatomic and unchanged. Mild apex anterior angulation persists. Nondisplaced ulnar styloid fracture is also unchanged. There is no dislocation. Soft tissue swelling surrounding the wrist persists. IMPRESSION: 1. Comminuted distal radius fracture  is in near anatomic alignment. Minimal apex anterior angulation persists. 2. Nondisplaced ulnar styloid fracture is unchanged. 3. Soft tissue swelling surrounding wrist. Electronically Signed   By: Ronney Asters M.D.   On: 03/08/2021 23:31   DG Wrist Complete Left  Result Date: 03/04/2021 CLINICAL DATA:  Left wrist pain after a fall earlier today. EXAM: LEFT WRIST - COMPLETE 3+ VIEW COMPARISON:  None. FINDINGS: Transverse mildly comminuted fractures of the distal left radial metaphysis with fracture lines extending to the radiocarpal and radioulnar joints. There is impaction of the fracture fragments with dorsal angulation of the distal fragments. Nondisplaced fracture across the base of the ulnar styloid process. Associated soft tissue swelling. Degenerative changes in the carpal joints. IMPRESSION: Acute fractures of the distal left radius and ulna as described. Electronically Signed   By: Lucienne Capers M.D.   On: 03/04/2021 16:14   DG Wrist Complete Right  Result Date: 03/04/2021 CLINICAL DATA:  Wrist pain after a fall earlier today. EXAM: RIGHT WRIST - COMPLETE 3+ VIEW COMPARISON:  None. FINDINGS: Degenerative changes in the right wrist involving the STT and first carpometacarpal joints. Benign-appearing cyst in the scaphoid bone. No evidence of acute fracture or dislocation. No destructive or expansile bone lesions. Soft tissues are unremarkable. IMPRESSION: Degenerative changes in the right wrist. No acute bony abnormalities. Electronically Signed   By: Lucienne Capers M.D.   On: 03/04/2021 16:13   CT Head Wo Contrast  Result Date: 03/19/2021 CLINICAL DATA:  Fall injury on blood thinners. EXAM: CT HEAD WITHOUT CONTRAST CT CERVICAL SPINE WITHOUT CONTRAST TECHNIQUE: Multidetector CT imaging of the head and cervical spine was performed following the standard protocol without intravenous contrast. Multiplanar CT image reconstructions of the cervical spine were also generated. RADIATION DOSE  REDUCTION: This exam was performed according to the departmental dose-optimization program which includes automated exposure control, adjustment of the mA and/or kV according to patient size and/or use of iterative reconstruction technique. COMPARISON:  Head CT 03/08/2021, cervical spine CT 11/10/2017 FINDINGS: CT HEAD FINDINGS Brain: There is moderately developed atrophy with atrophic ventriculomegaly and relatively mild small vessel disease in the cerebral deep white matter. There is no midline shift. The cerebellum and brainstem are not well seen due to metallic artifact from dental fillings but no obvious focal abnormality is evident. There is no focal asymmetry worrisome for acute infarct,  hemorrhage or mass. There are scattered calcifications in the thalami. Vascular: There calcifications in the carotid siphons but no hyperdense central vessels. Skull: Normal. Negative for fracture or focal lesion. Sinuses/Orbits: Unremarkable orbits and orbital contents. S-shaped nasal septum. Chronic left maxillary sinus opacification and hyperostosis with protrusion of the process into the left nasal passage and calcifications in the consolidated tissue. There is patchy membrane thickening in the ethmoids. Other sinuses and the bilateral mastoid air cells are clear. Other: None. CT CERVICAL SPINE FINDINGS Alignment: Straightened lordosis is again noted with slight degenerative grade 1 anterolisthesis again at C4-5 and C5-6, stable. Skull base and vertebrae: No fracture or suspicious bone lesion. Degenerative cystic changes in the dens are chronically noted, scattered endplate Schmorl's nodes. There is osteopenia. Narrowing and moderate spurring anterior atlantodental joint. Soft tissues and spinal canal: No prevertebral fluid or swelling. No visible canal hematoma. Disc levels: The cervical discs are diffusely degenerated except for C2-3 which maintains a normal height. Bidirectional osteophytes encroach on the cord surface  at multiple levels compressing the ventral cord and causing moderate spinal canal stenosis at C3-4, C4-5, C5-6 and to a lesser extent C6-7. There is facet joint and uncinate hypertrophy throughout with multilevel foraminal stenosis most significantly at C3-4 and C4-5, and on the left at C7-T1. This was seen previously. Upper chest: There is a chronic small ground-glass infiltrate in the posterior left apex, stable. Other: Chronic nuchal ligament calcifications, C6 level. IMPRESSION: 1. No acute intracranial CT findings, depressed skull fractures, or interval changes. 2. Chronic left maxillary sinusitis. 3. Osteopenia and degenerative change of the cervical spine without evidence of fractures or interval changes. Electronically Signed   By: Telford Nab M.D.   On: 03/19/2021 06:38   CT Cervical Spine Wo Contrast  Result Date: 03/19/2021 CLINICAL DATA:  Fall injury on blood thinners. EXAM: CT HEAD WITHOUT CONTRAST CT CERVICAL SPINE WITHOUT CONTRAST TECHNIQUE: Multidetector CT imaging of the head and cervical spine was performed following the standard protocol without intravenous contrast. Multiplanar CT image reconstructions of the cervical spine were also generated. RADIATION DOSE REDUCTION: This exam was performed according to the departmental dose-optimization program which includes automated exposure control, adjustment of the mA and/or kV according to patient size and/or use of iterative reconstruction technique. COMPARISON:  Head CT 03/08/2021, cervical spine CT 11/10/2017 FINDINGS: CT HEAD FINDINGS Brain: There is moderately developed atrophy with atrophic ventriculomegaly and relatively mild small vessel disease in the cerebral deep white matter. There is no midline shift. The cerebellum and brainstem are not well seen due to metallic artifact from dental fillings but no obvious focal abnormality is evident. There is no focal asymmetry worrisome for acute infarct, hemorrhage or mass. There are scattered  calcifications in the thalami. Vascular: There calcifications in the carotid siphons but no hyperdense central vessels. Skull: Normal. Negative for fracture or focal lesion. Sinuses/Orbits: Unremarkable orbits and orbital contents. S-shaped nasal septum. Chronic left maxillary sinus opacification and hyperostosis with protrusion of the process into the left nasal passage and calcifications in the consolidated tissue. There is patchy membrane thickening in the ethmoids. Other sinuses and the bilateral mastoid air cells are clear. Other: None. CT CERVICAL SPINE FINDINGS Alignment: Straightened lordosis is again noted with slight degenerative grade 1 anterolisthesis again at C4-5 and C5-6, stable. Skull base and vertebrae: No fracture or suspicious bone lesion. Degenerative cystic changes in the dens are chronically noted, scattered endplate Schmorl's nodes. There is osteopenia. Narrowing and moderate spurring anterior atlantodental joint. Soft tissues and  spinal canal: No prevertebral fluid or swelling. No visible canal hematoma. Disc levels: The cervical discs are diffusely degenerated except for C2-3 which maintains a normal height. Bidirectional osteophytes encroach on the cord surface at multiple levels compressing the ventral cord and causing moderate spinal canal stenosis at C3-4, C4-5, C5-6 and to a lesser extent C6-7. There is facet joint and uncinate hypertrophy throughout with multilevel foraminal stenosis most significantly at C3-4 and C4-5, and on the left at C7-T1. This was seen previously. Upper chest: There is a chronic small ground-glass infiltrate in the posterior left apex, stable. Other: Chronic nuchal ligament calcifications, C6 level. IMPRESSION: 1. No acute intracranial CT findings, depressed skull fractures, or interval changes. 2. Chronic left maxillary sinusitis. 3. Osteopenia and degenerative change of the cervical spine without evidence of fractures or interval changes. Electronically Signed    By: Telford Nab M.D.   On: 03/19/2021 06:38   MR ANGIO HEAD WO CONTRAST  Result Date: 03/09/2021 CLINICAL DATA:  Initial evaluation for neuro deficit, stroke suspected. EXAM: MRI HEAD WITHOUT CONTRAST MRA HEAD WITHOUT CONTRAST TECHNIQUE: Multiplanar, multi-echo pulse sequences of the brain and surrounding structures were acquired without intravenous contrast. Angiographic images of the Circle of Willis were acquired using MRA technique without intravenous contrast. COMPARISON:  Prior CT from 03/08/2021. FINDINGS: MRI HEAD FINDINGS Brain: Diffuse prominence of the CSF containing spaces compatible generalized cerebral atrophy. Patchy and confluent T2/FLAIR hyperintensity involving the periventricular and deep white matter blue cerebral hemispheres most consistent with chronic small vessel ischemic disease, mild to moderate in nature. Few remote lacunar infarcts present about the thalami. No abnormal foci of restricted diffusion to suggest acute or subacute ischemia. Gray-white matter differentiation maintained. No encephalomalacia to suggest chronic cortical infarction. No acute intracranial hemorrhage. 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 No other evidence for chronic intracranial hemorrhage. No mass lesion, midline shift or mass effect. Mild ventricular prominence related to global parenchymal volume loss without hydrocephalus. No extra-axial fluid collection. Pituitary gland suprasellar region within normal limits. Midline structures intact. Vascular: Major intracranial vascular flow voids are maintained. Skull and upper cervical spine: Craniocervical junction within normal limits. Bone marrow signal intensity normal. No scalp soft tissue abnormality. Sinuses/Orbits: Globes and orbital soft tissues within normal limits. Chronic left maxillary sinusitis. Scattered mucosal thickening noted elsewhere throughout the paranasal sinuses. Trace bilateral  mastoid effusions. Visualized nasopharynx unremarkable. Inner ear structures grossly normal. Other: None. MRA HEAD FINDINGS Anterior circulation: Visualized distal cervical segments of the internal carotid arteries are patent with antegrade flow. Right ICA widely patent to the terminus. There is focal mild-to-moderate stenosis involving the supraclinoid left ICA just prior to the terminus (series 1, image 98). Left ICA otherwise widely patent. A1 segments patent. Normal anterior communicating artery complex. Anterior cerebral arteries patent without stenosis. No M1 stenosis or occlusion. Normal MCA bifurcations. Distal MCA branches well perfused and symmetric. Posterior circulation: Visualized V4 segments patent without stenosis. Both PICA patent. Basilar patent to its distal aspect without stenosis. Superior cerebral arteries patent bilaterally. Right PCA primarily supplied via the basilar. Fetal type origin of the left PCA. 3 mm funnel shaped outpouching extending from the left aspect of the basilar tip most likely reflects a vascular infundibulum related to a hypoplastic left P1 segment. Both PCAs widely patent to their distal aspects. Anatomic variants: Fetal type origin of the left PCA.  No aneurysm. IMPRESSION: MRI HEAD IMPRESSION: 1. No acute intracranial abnormality. 2. Generalized cerebral atrophy with  mild to moderate chronic microvascular ischemic disease. 3. Chronic left maxillary sinusitis. MRA HEAD IMPRESSION: 1. Negative intracranial MRA for large vessel occlusion. 2. 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 No other hemodynamically significant or correctable stenosis identified. Electronically Signed   By: Jeannine Boga M.D.   On: 03/09/2021 01:06   MR BRAIN WO CONTRAST  Result Date: 03/09/2021 CLINICAL DATA:  Initial evaluation for neuro deficit, stroke suspected. EXAM: MRI HEAD WITHOUT CONTRAST MRA HEAD WITHOUT CONTRAST  TECHNIQUE: Multiplanar, multi-echo pulse sequences of the brain and surrounding structures were acquired without intravenous contrast. Angiographic images of the Circle of Willis were acquired using MRA technique without intravenous contrast. COMPARISON:  Prior CT from 03/08/2021. FINDINGS: MRI HEAD FINDINGS Brain: Diffuse prominence of the CSF containing spaces compatible generalized cerebral atrophy. Patchy and confluent T2/FLAIR hyperintensity involving the periventricular and deep white matter blue cerebral hemispheres most consistent with chronic small vessel ischemic disease, mild to moderate in nature. Few remote lacunar infarcts present about the thalami. No abnormal foci of restricted diffusion to suggest acute or subacute ischemia. Gray-white matter differentiation maintained. No encephalomalacia to suggest chronic cortical infarction. No acute intracranial hemorrhage. 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 No other evidence for chronic intracranial hemorrhage. No mass lesion, midline shift or mass effect. Mild ventricular prominence related to global parenchymal volume loss without hydrocephalus. No extra-axial fluid collection. Pituitary gland suprasellar region within normal limits. Midline structures intact. Vascular: Major intracranial vascular flow voids are maintained. Skull and upper cervical spine: Craniocervical junction within normal limits. Bone marrow signal intensity normal. No scalp soft tissue abnormality. Sinuses/Orbits: Globes and orbital soft tissues within normal limits. Chronic left maxillary sinusitis. Scattered mucosal thickening noted elsewhere throughout the paranasal sinuses. Trace bilateral mastoid effusions. Visualized nasopharynx unremarkable. Inner ear structures grossly normal. Other: None. MRA HEAD FINDINGS Anterior circulation: Visualized distal cervical segments of the internal carotid arteries are patent with  antegrade flow. Right ICA widely patent to the terminus. There is focal mild-to-moderate stenosis involving the supraclinoid left ICA just prior to the terminus (series 1, image 98). Left ICA otherwise widely patent. A1 segments patent. Normal anterior communicating artery complex. Anterior cerebral arteries patent without stenosis. No M1 stenosis or occlusion. Normal MCA bifurcations. Distal MCA branches well perfused and symmetric. Posterior circulation: Visualized V4 segments patent without stenosis. Both PICA patent. Basilar patent to its distal aspect without stenosis. Superior cerebral arteries patent bilaterally. Right PCA primarily supplied via the basilar. Fetal type origin of the left PCA. 3 mm funnel shaped outpouching extending from the left aspect of the basilar tip most likely reflects a vascular infundibulum related to a hypoplastic left P1 segment. Both PCAs widely patent to their distal aspects. Anatomic variants: Fetal type origin of the left PCA.  No aneurysm. IMPRESSION: MRI HEAD IMPRESSION: 1. No acute intracranial abnormality. 2. Generalized cerebral atrophy with mild to moderate chronic microvascular ischemic disease. 3. Chronic left maxillary sinusitis. MRA HEAD IMPRESSION: 1. Negative intracranial MRA for large vessel occlusion. 2. 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 No other hemodynamically significant or correctable stenosis identified. Electronically Signed   By: Jeannine Boga M.D.   On: 03/09/2021 01:06   DG Pelvis Portable  Result Date: 03/19/2021 CLINICAL DATA:  81 year old male with history of trauma from a fall. EXAM: PORTABLE PELVIS 1-2 VIEWS COMPARISON:  No priors. FINDINGS: There is no  evidence of pelvic fracture or diastasis. No pelvic bone lesions are seen. IMPRESSION: Negative. Electronically Signed   By: Vinnie Langton M.D.   On: 03/19/2021 06:12   DG Chest Portable 1 View  Result Date:  03/19/2021 CLINICAL DATA:  81 year old male with history of trauma from a fall. EXAM: PORTABLE CHEST 1 VIEW COMPARISON:  Chest x-ray 11/11/2017. FINDINGS: Calcified pleural plaques are noted in the thorax bilaterally, indicative of asbestos related pleural disease. Regional areas of architectural distortion are noted in the lungs bilaterally, likely reflective of some associated fibrosis, potentially asbestosis, similar to the prior examination allowing for differences in patient positioning and inspiration. No definite acute consolidative airspace disease. No pleural effusions. No pneumothorax. No evidence of pulmonary edema. Heart size is normal. Upper mediastinal contours are within normal limits. Atherosclerotic calcifications are noted in the thoracic aorta. IMPRESSION: 1. No definite radiographic evidence of significant acute traumatic injury to the thorax. 2. 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 These findings could be better evaluated with follow-up nonemergent high-resolution chest CT if clinically appropriate. 3. Aortic atherosclerosis. Electronically Signed   By: Vinnie Langton M.D.   On: 03/19/2021 06:13   ECHOCARDIOGRAM COMPLETE  Result Date: 03/09/2021    ECHOCARDIOGRAM REPORT   Patient Name:   Matthew Knight Date of Exam: 03/09/2021 Medical Rec #:  [id number]             Height:       72.0 in Accession #:    [id number]            Weight:       200.0 lb Date of Birth:  [birth year]              BSA:          2.130 m Patient Age:    72 years              BP:           163/81 mmHg Patient Gender: M                     HR:           59 bpm. Exam Location:  Inpatient Procedure: 2D Echo, Color Doppler and Cardiac Doppler Indications:    Stroke i63.9  History:        Patient has prior history of Echocardiogram examinations, most                 recent 11/11/2017. Risk Factors:Hypertension and Diabetes.  Sonographer:    Arlyss Gandy Referring Phys: [id number]  [place]  1. Left ventricular ejection fraction, by estimation, is 60 to 65%. The left ventricle has normal function. The left ventricle has no regional wall motion abnormalities. There is moderate left ventricular hypertrophy. Left ventricular diastolic parameters are consistent with Grade I diastolic dysfunction (impaired relaxation).  2. Right ventricular systolic function is normal. The right ventricular size is normal.  3. The mitral valve is normal in structure. No evidence of mitral valve regurgitation. No evidence of mitral stenosis.  4. The aortic valve is tricuspid. There is mild calcification of the aortic valve. There is mild thickening of the aortic valve. Aortic valve regurgitation is mild. FINDINGS  Left Ventricle: Left ventricular ejection fraction, by estimation, is 60 to 65%. The left ventricle has normal function. The left ventricle has no regional wall motion abnormalities. The left ventricular internal cavity size was normal in size. There is  moderate left ventricular hypertrophy. Left ventricular diastolic parameters are consistent with Grade I diastolic dysfunction (impaired relaxation). Normal left ventricular filling pressure. Right Ventricle: The right ventricular size is normal. No increase in right ventricular wall thickness. Right ventricular systolic function is normal. Left Atrium: Left atrial size was normal in size. Right Atrium: Right atrial size was normal in size. Pericardium: There is no evidence of pericardial effusion. Mitral Valve: The mitral valve is normal in structure. No evidence of mitral valve regurgitation. No evidence of mitral valve stenosis. Tricuspid Valve: The tricuspid valve is normal in structure. Tricuspid valve regurgitation is not demonstrated. No evidence of tricuspid stenosis. Aortic Valve: The aortic valve is tricuspid. There is mild calcification of the aortic valve. There is mild thickening of the aortic valve. There is mild aortic  valve annular calcification. Aortic valve regurgitation is mild. Aortic valve mean gradient measures 3.0 mmHg. Aortic valve peak gradient measures 6.2 mmHg. Aortic valve area, by VTI measures 2.83 cm. Pulmonic Valve: The pulmonic valve was not well visualized. Pulmonic valve regurgitation is not visualized. No evidence of pulmonic stenosis. Aorta: The aortic root is normal in size and structure. IAS/Shunts: No atrial level shunt detected by color flow Doppler.  LEFT VENTRICLE PLAX 2D LVIDd:         5.20 cm   Diastology LVIDs:         3.70 cm   LV e' medial:    7.83 cm/s LV PW:         1.30 cm   LV E/e' medial:  9.2 LV IVS:        1.30 cm   LV e' lateral:   8.59 cm/s LVOT diam:     2.00 cm   LV E/e' lateral: 8.4 LV SV:         77 LV SV Index:   36 LVOT Area:     3.14 cm  RIGHT VENTRICLE RV S prime:     14.60 cm/s TAPSE (M-mode): 1.9 cm LEFT ATRIUM             Index LA Vol (A2C):   55.8 ml 26.19 ml/m LA Vol (A4C):   47.2 ml 22.16 ml/m LA Biplane Vol: 52.4 ml 24.60 ml/m  AORTIC VALVE                    PULMONIC VALVE AV Area (Vmax):    2.99 cm     PV Vmax:       1.20 m/s AV Area (Vmean):   2.80 cm     PV Peak grad:  5.8 mmHg AV Area (VTI):     2.83 cm AV Vmax:           125.00 cm/s AV Vmean:          83.400 cm/s AV VTI:            0.272 m AV Peak Grad:      6.2 mmHg AV Mean Grad:      3.0 mmHg LVOT Vmax:         119.00 cm/s LVOT Vmean:        74.400 cm/s LVOT VTI:          0.245 m LVOT/AV VTI ratio: 0.90  AORTA Ao Root diam: 3.10 cm Ao Asc diam:  3.10 cm MITRAL VALVE MV Area (PHT): 3.27 cm     SHUNTS MV Decel Time: 232 msec     Systemic VTI:  0.24 m MV E velocity: 72.30 cm/s  Systemic Diam: 2.00 cm MV A velocity: 110.00 cm/s MV E/A ratio:  0.66 Carlyle Dolly MD Electronically signed by Carlyle Dolly MD Signature Date/Time: 03/09/2021/11:32:54 AM    Final    CT HEAD CODE STROKE WO CONTRAST  Result Date: 03/08/2021 CLINICAL DATA:  Code stroke. Initial evaluation for acute left-sided weakness. EXAM: CT HEAD  WITHOUT CONTRAST TECHNIQUE: Contiguous axial images were obtained from the base of the skull through the vertex without intravenous contrast. COMPARISON:  Prior CT from 10/20/2018. FINDINGS: Brain: Age-related cerebral atrophy with chronic microvascular ischemic disease. Scattered multifocal areas of calcification/mineralization about the bilateral thalami noted, similar to previous. No acute intracranial hemorrhage. No acute large vessel territory infarct. No mass lesion, midline shift or mass effect. Stable ventricular size and morphology without hydrocephalus. No extra-axial fluid collection. Vascular: No hyperdense vessel. Scattered vascular calcifications noted within the carotid siphons. Skull: Scalp soft tissues and calvarium within normal limits. Sinuses/Orbits: Globes orbital soft tissues demonstrate no acute finding. Chronic left maxillary sinusitis noted. Paranasal sinuses are otherwise largely clear. No mastoid effusion. Other: None. ASPECTS [facility] Stroke Program Early CT Score) - Ganglionic level infarction (caudate, lentiform nuclei, internal capsule, insula, M1-M3 cortex): 7 - Supraganglionic infarction (M4-M6 cortex): 3 Total score (0-10 with 10 being normal): 10 IMPRESSION: 1. No acute intracranial infarct or other abnormality. 2. ASPECTS is 10. 3. Age-related cerebral atrophy with chronic microvascular ischemic disease. Superimposed scattered multifocal areas of calcification/mineralization about the bilateral thalami, stable. These results were communicated to Dr. Cheral Marker at 10:38 pm on 03/08/2021 by text page via the [facility] messaging system. Electronically Signed   By: Jeannine Boga M.D.   On: 03/08/2021 22:40   VAS US CAROTID (at [facility] and [facility] only)  Result Date: 03/09/2021 Carotid Arterial Duplex Study Patient Name:  Matthew Knight  Date of Exam:   03/09/2021 Medical Rec #: [id number]              Accession #:    [id number] Date of Birth: 1940/05/23               Patient Gender: M  Patient Age:   62 years Exam Location:  [facility] Procedure:      VAS US CAROTID Referring Phys: Christia Reading OPYD --------------------------------------------------------------------------------  Indications:       CVA. Risk Factors:      Hypertension, Diabetes, current smoker, prior CVA. Limitations        Today's exam was limited due to the patient's respiratory                    variation. Comparison Study:  11-11-2017 Carotid duplex showed 1-39% stenosis bilaterally. Performing Technologist: Darlin Coco RDMS, RVT  Examination Guidelines: A complete evaluation includes B-mode imaging, spectral Doppler, color Doppler, and power Doppler as needed of all accessible portions of each vessel. Bilateral testing is considered an integral part of a complete examination. Limited examinations for reoccurring indications may be performed as noted.  Right Carotid Findings: +----------+--------+--------+--------+------------------+--------+             PSV cm/s EDV cm/s Stenosis Plaque Description Comments  +----------+--------+--------+--------+------------------+--------+  CCA Prox   122      15                                             +----------+--------+--------+--------+------------------+--------+  CCA Distal 78  9                                              +----------+--------+--------+--------+------------------+--------+  ICA Prox   89       15       1-39%    heterogenous                 +----------+--------+--------+--------+------------------+--------+  ICA Distal 67       10                                             +----------+--------+--------+--------+------------------+--------+  ECA        120                                                     +----------+--------+--------+--------+------------------+--------+ +----------+--------+-------+----------------+-------------------+             PSV cm/s EDV cms Describe         Arm Pressure (mmHG)   +----------+--------+-------+----------------+-------------------+  Subclavian 90               Multiphasic, WNL                      +----------+--------+-------+----------------+-------------------+ +---------+--------+--+--------+--+---------+  Vertebral PSV cm/s 56 EDV cm/s 11 Antegrade  +---------+--------+--+--------+--+---------+  Left Carotid Findings: +----------+--------+--------+--------+------------------+--------+             PSV cm/s EDV cm/s Stenosis Plaque Description Comments  +----------+--------+--------+--------+------------------+--------+  CCA Prox   114      10                                             +----------+--------+--------+--------+------------------+--------+  CCA Distal 75       10                                             +----------+--------+--------+--------+------------------+--------+  ICA Prox   107      16       1-39%    heterogenous                 +----------+--------+--------+--------+------------------+--------+  ICA Distal 62       13                                             +----------+--------+--------+--------+------------------+--------+  ECA        145      11                                             +----------+--------+--------+--------+------------------+--------+ +----------+--------+--------+--------+-------------------+  PSV cm/s EDV cm/s Describe Arm Pressure (mmHG)  +----------+--------+--------+--------+-------------------+  Subclavian 245               Stenotic                      +----------+--------+--------+--------+-------------------+ +---------+--------+--+--------+--+---------+  Vertebral PSV cm/s 73 EDV cm/s 13 Antegrade  +---------+--------+--+--------+--+---------+   Summary: Right Carotid: Velocities in the right ICA are consistent with a 1-39% stenosis. Left Carotid: Velocities in the left ICA are consistent with a 1-39% stenosis. Vertebrals:  Bilateral vertebral arteries demonstrate antegrade flow. Subclavians: Left  subclavian artery was stenotic. Normal flow hemodynamics were              seen in the right subclavian artery. *See table(s) above for measurements and observations.  Electronically signed by Antony Contras MD on 03/09/2021 at 1:01:44 PM.    Final    CT ANGIO GI BLEED  Result Date: 03/19/2021 CLINICAL DATA:  Acute mesenteric ischemia, abdominal pain EXAM: CTA ABDOMEN AND PELVIS WITHOUT AND WITH CONTRAST TECHNIQUE: Multidetector CT imaging of the abdomen and pelvis was performed using the standard protocol during bolus administration of intravenous contrast. Multiplanar reconstructed images and MIPs were obtained and reviewed to evaluate the vascular anatomy. CONTRAST:  150mL OMNIPAQUE IOHEXOL 350 MG/ML SOLN COMPARISON:  10/22/2004 FINDINGS: VASCULAR Aorta: Moderate partially calcified atheromatous plaque particularly in the infrarenal segment. No aneurysm, dissection, or stenosis. Celiac: Calcified ostial plaque with a mild smooth stenosis, widely patent distally. SMA: Patent without evidence of aneurysm, dissection, vasculitis or significant stenosis. Renals: Duplicated left, inferior dominant, both patent. Single right, widely patent. IMA: Patent without evidence of aneurysm, dissection, vasculitis or significant stenosis. Inflow: Mild bilateral common iliac and external iliac partially calcified plaque, without aneurysm, dissection, or stenosis. Proximal Outflow: Bilateral common femoral and visualized portions of the superficial and profunda femoral arteries are patent without evidence of aneurysm, dissection, vasculitis or significant stenosis. Veins: No obvious venous abnormality within the limitations of this arterial phase study. Review of the MIP images confirms the above findings. NON-VASCULAR Lower chest: No pleural or pericardial effusion. Partially calcified bilateral pleural plaques. Hepatobiliary: No focal liver abnormality is seen. No gallstones, gallbladder wall thickening, or biliary dilatation.  Pancreas: Unremarkable. No pancreatic ductal dilatation or surrounding inflammatory changes. Spleen: Normal in size without focal abnormality. Adrenals/Urinary Tract: Mildly heterogenous 1.6 cm right adrenal nodule measuring 92 Hounsfield units, new since previous. No renal mass or hydronephrosis. Urinary bladder is decompressed by Foley catheter. Stomach/Bowel: Stomach physiologically distended by fluid. The small bowel is decompressed, unremarkable. The colon is nondilated, unremarkable. Lymphatic: Subcentimeter left para-aortic and bilateral common iliac lymph nodes. No mesenteric adenopathy. Reproductive: Prostate enlargement Other: Bilateral pelvic phleboliths.  No ascites.  No free air. Musculoskeletal: No fracture or worrisome bone lesion. Degenerative disc disease L4-5. IMPRESSION: 1. No acute findings. No significant proximal mesenteric arterial occlusive disease. 2. 1.6 cm nonspecific right adrenal nodule, possible pheochromocytoma versus adenoma. Recommend biochemical lab evaluation for pheochromocytoma. If lab values normal, 1 year follow-up adrenal washout CT. References: JACR 2017 Aug; 14(8):1038-44, JCAT 2016 Mar-Apr; 40(2):194-200. 3. Aortoiliac  atherosclerosis (ICD10-170.0). 4. 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   On: 03/19/2021 08:30     Discharge Exam: Vitals:   03/25/21 0358 03/25/21 0737  BP: 138/62 (!) 146/62  Pulse: 65 85  Resp: 18 16  Temp: 97.8 F (36.6 C) 98.1 F (36.7 C)  SpO2: 94% 95%  Vitals:   03/24/21 1425 03/24/21 2136 03/25/21 0358 03/25/21 0737  BP: (!) 147/69 (!) 147/70 138/62 (!) 146/62  Pulse: 81 89 65 85  Resp: 17 18 18 16   Temp: 98 F (36.7 C) 98.4 F (36.9 C) 97.8 F (36.6 C) 98.1 F (36.7 C)  TempSrc: Oral  Oral Oral  SpO2: 93% 95% 94% 95%  Weight:      Height:        General: Pt is alert, awake, not in acute distress Cardiovascular: RRR, S1/S2 +, no  rubs, no gallops Respiratory: CTA bilaterally, no wheezing, no rhonchi Abdominal: Soft, NT, ND, bowel sounds + Extremities: no edema, no cyanosis    The results of significant diagnostics from this hospitalization (including imaging, microbiology, ancillary and laboratory) are listed below for reference.     Microbiology: Recent Results (from the past 240 hour(s))  Resp Panel by RT-PCR (Flu A&B, Covid) Nasopharyngeal Swab     Status: None   Collection Time: 03/19/21  5:50 AM   Specimen: Nasopharyngeal Swab; Nasopharyngeal(NP) swabs in vial transport medium  Result Value Ref Range Status   SARS Coronavirus 2 by RT PCR NEGATIVE NEGATIVE Final    Comment: (NOTE) SARS-CoV-2 target nucleic acids are NOT DETECTED.  The SARS-CoV-2 RNA is generally detectable in upper respiratory specimens during the acute phase of infection. The lowest concentration of SARS-CoV-2 viral copies this assay can detect is 138 copies/mL. A negative result does not preclude SARS-Cov-2 infection and should not be used as the sole basis for treatment or other patient management decisions. A negative result may occur with  improper specimen collection/handling, submission of specimen other than nasopharyngeal swab, presence of viral mutation(s) within the areas targeted by this assay, and inadequate number of viral copies(<138 copies/mL). A negative result must be combined with clinical observations, patient history, and epidemiological information. The expected result is Negative.  Fact Sheet for Patients:  EntrepreneurPulse.com.au  Fact Sheet for Healthcare Providers:  IncredibleEmployment.be  This test is no t yet approved or cleared by the [place] FDA and  has been authorized for detection and/or diagnosis of SARS-CoV-2 by FDA under an Emergency Use Authorization (EUA). This EUA will remain  in effect (meaning this test can be used) for the duration of  the COVID-19 declaration under Section 564(b)(1) of the Act, 21 U.S.C.section 360bbb-3(b)(1), unless the authorization is terminated  or revoked sooner.       Influenza A by PCR NEGATIVE NEGATIVE Final   Influenza B by PCR NEGATIVE NEGATIVE Final    Comment: (NOTE) The Xpert Xpress SARS-CoV-2/FLU/RSV plus assay is intended as an aid in the diagnosis of influenza from Nasopharyngeal swab specimens and should not be used as a sole basis for treatment. Nasal washings and aspirates are unacceptable for Xpert Xpress SARS-CoV-2/FLU/RSV testing.  Fact Sheet for Patients: EntrepreneurPulse.com.au  Fact Sheet for Healthcare Providers: IncredibleEmployment.be  This test is not yet approved or cleared by the [place] FDA and has been authorized for detection and/or diagnosis of SARS-CoV-2 by FDA under an Emergency Use Authorization (EUA). This EUA will remain in effect (meaning this test can be used) for the duration of the COVID-19 declaration under Section 564(b)(1) of the Act, 21 U.S.C. section 360bbb-3(b)(1), unless the authorization is terminated or revoked.  Performed at [facility], [place] [address], [place], [place] [address]      Labs: BNP (last 3 results) No results for input(s): BNP in the last 8760 hours. Basic Metabolic Panel: Recent Labs  Lab  03/19/21 0549 03/20/21 0118 03/21/21 0422 03/24/21 0545  NA 140 139 139 142  K 4.2 4.6 3.9 3.8  CL 107 109 105 107  CO2 22 26 25 25   GLUCOSE 136* 110* 108* 108*  BUN 25* 24* 15 10  CREATININE 1.38* 1.22 1.05 1.07  CALCIUM 8.7* 8.0* 8.4* 9.1   Liver Function Tests: Recent Labs  Lab 03/19/21 0549  AST 25  ALT 22  ALKPHOS 66  BILITOT 0.7  PROT 5.9*  ALBUMIN 2.9*   Recent Labs  Lab 03/19/21 0549  LIPASE 35   No results for input(s): AMMONIA in the last 168 hours. CBC: Recent Labs  Lab 03/19/21 0549 03/19/21 1600 03/20/21 0118 03/21/21 0422 03/22/21 0729  03/24/21 0545  WBC 15.3*  --  14.8* 10.3 7.6 10.2  NEUTROABS 11.5*  --   --   --   --   --   HGB 11.1* 10.8* 9.4* 8.3* 9.2* 9.7*  HCT 34.9* 34.1* 28.5* 25.9* 28.1* 29.3*  MCV 89.3  --  88.0 87.8 87.3 84.9  PLT 440*  --  436* 424* 431* 588*   Cardiac Enzymes: No results for input(s): CKTOTAL, CKMB, CKMBINDEX, TROPONINI in the last 168 hours. BNP: Invalid input(s): POCBNP CBG: Recent Labs  Lab 03/21/21 0802 03/24/21 0908  GLUCAP 94 102*   D-Dimer No results for input(s): DDIMER in the last 72 hours. Hgb A1c No results for input(s): HGBA1C in the last 72 hours. Lipid Profile No results for input(s): CHOL, HDL, LDLCALC, TRIG, CHOLHDL, LDLDIRECT in the last 72 hours. Thyroid function studies No results for input(s): TSH, T4TOTAL, T3FREE, THYROIDAB in the last 72 hours.  Invalid input(s): FREET3 Anemia work up Recent Labs    03/24/21 0918  FERRITIN 15*  TIBC 438  IRON 22*   Urinalysis    Component Value Date/Time   COLORURINE YELLOW 03/10/2021 [place] 03/10/2021 1531   LABSPEC 1.025 03/10/2021 1531   PHURINE 6.0 03/10/2021 1531   GLUCOSEU NEGATIVE 03/10/2021 1531   HGBUR NEGATIVE 03/10/2021 1531   BILIRUBINUR NEGATIVE 03/10/2021 1531   KETONESUR NEGATIVE 03/10/2021 1531   PROTEINUR NEGATIVE 03/10/2021 1531   NITRITE NEGATIVE 03/10/2021 1531   LEUKOCYTESUR NEGATIVE 03/10/2021 1531   Sepsis Labs Invalid input(s): PROCALCITONIN,  WBC,  LACTICIDVEN Microbiology Recent Results (from the past 240 hour(s))  Resp Panel by RT-PCR (Flu A&B, Covid) Nasopharyngeal Swab     Status: None   Collection Time: 03/19/21  5:50 AM   Specimen: Nasopharyngeal Swab; Nasopharyngeal(NP) swabs in vial transport medium  Result Value Ref Range Status   SARS Coronavirus 2 by RT PCR NEGATIVE NEGATIVE Final    Comment: (NOTE) SARS-CoV-2 target nucleic acids are NOT DETECTED.  The SARS-CoV-2 RNA is generally detectable in upper respiratory specimens during the acute phase  of infection. The lowest concentration of SARS-CoV-2 viral copies this assay can detect is 138 copies/mL. A negative result does not preclude SARS-Cov-2 infection and should not be used as the sole basis for treatment or other patient management decisions. A negative result may occur with  improper specimen collection/handling, submission of specimen other than nasopharyngeal swab, presence of viral mutation(s) within the areas targeted by this assay, and inadequate number of viral copies(<138 copies/mL). A negative result must be combined with clinical observations, patient history, and epidemiological information. The expected result is Negative.  Fact Sheet for Patients:  EntrepreneurPulse.com.au  Fact Sheet for Healthcare Providers:  IncredibleEmployment.be  This test is no t yet approved or cleared by the  [place] Architectural technologist and  has been authorized for detection and/or diagnosis of SARS-CoV-2 by FDA under an Print production planner (EUA). This EUA will remain  in effect (meaning this test can be used) for the duration of the COVID-19 declaration under Section 564(b)(1) of the Act, 21 U.S.C.section 360bbb-3(b)(1), unless the authorization is terminated  or revoked sooner.       Influenza A by PCR NEGATIVE NEGATIVE Final   Influenza B by PCR NEGATIVE NEGATIVE Final    Comment: (NOTE) The Xpert Xpress SARS-CoV-2/FLU/RSV plus assay is intended as an aid in the diagnosis of influenza from Nasopharyngeal swab specimens and should not be used as a sole basis for treatment. Nasal washings and aspirates are unacceptable for Xpert Xpress SARS-CoV-2/FLU/RSV testing.  Fact Sheet for Patients: EntrepreneurPulse.com.au  Fact Sheet for Healthcare Providers: IncredibleEmployment.be  This test is not yet approved or cleared by the [place] FDA and has been authorized for detection and/or diagnosis of  SARS-CoV-2 by FDA under an Emergency Use Authorization (EUA). This EUA will remain in effect (meaning this test can be used) for the duration of the COVID-19 declaration under Section 564(b)(1) of the Act, 21 U.S.C. section 360bbb-3(b)(1), unless the authorization is terminated or revoked.  Performed at [facility], [place] [address], [place], [place] [address]      Time coordinating discharge: Over 30 minutes  SIGNED:   [name], MD  [facility] 03/25/2021, 10:30 AM  If 7PM-7AM, please contact night-coverage [email or web address]

## 2021-03-25 NOTE — TOC Transition Note (Signed)
Transition of Care [facility]) - CM/SW Discharge Note   Patient Details  Name: Matthew Knight MRN: [id number] Date of Birth: 05-23-1940  Transition of Care [facility]) CM/SW Contact:  Emeterio Reeve, LCSW Phone Number: 03/25/2021, 11:04 AM   Clinical Narrative:     Per MD patient ready for DC to [facility]. RN, patient, patient's family, and facility notified of DC. Discharge Summary and FL2 sent to facility. DC packet on chart. Pt is covid negative. Ambulance transport requested for patient.    RN to call report to [phone].  CSW will sign off for now as social work intervention is no longer needed. Please consult [place] again if new needs arise.   Final next level of care: Skilled Nursing Facility Barriers to Discharge: Barriers Resolved   Patient Goals and CMS Choice Patient states their goals for this hospitalization and ongoing recovery are:: return to SNF CMS Medicare.gov Compare Post Acute Care list provided to:: Patient Choice offered to / list presented to : Patient  Discharge Placement              Patient chooses bed at: [facility] Patient to be transferred to facility by: [facility] Name of family member notified: Daughter Patient and family notified of of transfer: 03/25/21  Discharge Plan and Services                                     Social Determinants of Health (SDOH) Interventions     Readmission Risk Interventions No flowsheet data found.

## 2021-03-25 NOTE — Plan of Care (Signed)
Patient ID: Matthew Knight, male   DOB: 02/04/1941, 81 y.o.   MRN: [id number]  Problem: Education: Goal: Knowledge of General Education information will improve Description: Including pain rating scale, medication(s)/side effects and non-pharmacologic comfort measures Outcome: Adequate for Discharge   Problem: Health Behavior/Discharge Planning: Goal: Ability to manage health-related needs will improve Outcome: Adequate for Discharge   Problem: Clinical Measurements: Goal: Ability to maintain clinical measurements within normal limits will improve Outcome: Adequate for Discharge Goal: Will remain free from infection Outcome: Adequate for Discharge Goal: Diagnostic test results will improve Outcome: Adequate for Discharge Goal: Respiratory complications will improve Outcome: Adequate for Discharge Goal: Cardiovascular complication will be avoided Outcome: Adequate for Discharge   Problem: Activity: Goal: Risk for activity intolerance will decrease Outcome: Adequate for Discharge   Problem: Nutrition: Goal: Adequate nutrition will be maintained Outcome: Adequate for Discharge   Problem: Coping: Goal: Level of anxiety will decrease Outcome: Adequate for Discharge   Problem: Elimination: Goal: Will not experience complications related to bowel motility Outcome: Adequate for Discharge Goal: Will not experience complications related to urinary retention Outcome: Adequate for Discharge   Problem: Pain Managment: Goal: General experience of comfort will improve Outcome: Adequate for Discharge   Problem: Safety: Goal: Ability to remain free from injury will improve Outcome: Adequate for Discharge   Problem: Skin Integrity: Goal: Risk for impaired skin integrity will decrease Outcome: Adequate for Discharge   Problem: Acute Rehab PT Goals(only PT should resolve) Goal: Pt Will Go Supine/Side To Sit Outcome: Adequate for Discharge Goal: Pt Will Go Sit To  Supine/Side Outcome: Adequate for Discharge Goal: Patient Will Transfer Sit To/From Stand Outcome: Adequate for Discharge Goal: Pt Will Ambulate Outcome: Adequate for Discharge    Haydee Salter, RN

## 2021-03-25 NOTE — Progress Notes (Signed)
Patient ID: Matthew Knight, male   DOB: 07-Feb-1941, 81 y.o.   MRN: [id number]   An After Visit Summary was printed and placed in patient's discharge packet. Discharge instructions given via telephone to [place] at  [facility]. No further questions at this time. Patient will travel via [place].  Haydee Salter, RN

## 2021-03-25 NOTE — Progress Notes (Addendum)
Daily Rounding Note  03/25/2021, 8:53 AM  LOS: 5 days   SUBJECTIVE:   Chief complaint: GI bleeding     No physical complaints.  "My heart is broken", says his wife of nearly 50 years passed away 2 months ago.  OBJECTIVE:         Vital signs in last 24 hours:    Temp:  [97.8 F (36.6 C)-98.4 F (36.9 C)] 98.1 F (36.7 C) (01/18 0737) Pulse Rate:  [56-89] 85 (01/18 0737) Resp:  [15-18] 16 (01/18 0737) BP: (109-170)/(47-70) 146/62 (01/18 0737) SpO2:  [93 %-100 %] 95 % (01/18 0737) Last BM Date: 03/23/21 Filed Weights   03/19/21 0615  Weight: 99.8 kg   General: Pleasant, calm.  Comfortable.  Does not look ill. Heart: RRR. Chest: No labored breathing or cough.  Lungs clear bilaterally Abdomen: Soft without tenderness.  Active bowel sounds. Extremities: No CCE. Neuro/Psych:  Easily awakened from sleep.  Oriented to self, place, 2022, stated "they slipped in another one on me" when I explained he had just entered 2023 recently.  Moves all 4 limbs without tremor.  Appropriate.  Intake/Output from previous day: 01/17 0701 - 01/18 0700 In: 1147.1 [P.O.:220; I.V.:803; IV Piggyback:124.1] Out: 700 [Urine:700]  Intake/Output this shift: No intake/output data recorded.  Lab Results: Recent Labs    03/24/21 0545  WBC 10.2  HGB 9.7*  HCT 29.3*  PLT 588*   BMET Recent Labs    03/24/21 0545  NA 142  K 3.8  CL 107  CO2 25  GLUCOSE 108*  BUN 10  CREATININE 1.07  CALCIUM 9.1   LFT No results for input(s): PROT, ALBUMIN, AST, ALT, ALKPHOS, BILITOT, BILIDIR, IBILI in the last 72 hours. PT/INR No results for input(s): LABPROT, INR in the last 72 hours. Hepatitis Panel No results for input(s): HEPBSAG, HCVAB, HEPAIGM, HEPBIGM in the last 72 hours.  Studies/Results: No results found.  Scheduled Meds:  aspirin EC  81 mg Oral Daily   Chlorhexidine Gluconate Cloth  6 each Topical Daily   escitalopram  20 mg  Oral Daily   latanoprost  1 drop Both Eyes QHS   mouth rinse  15 mL Mouth Rinse BID   pantoprazole  40 mg Oral Daily   pravastatin  20 mg Oral Daily   sodium chloride flush  3 mL Intravenous Q12H   Continuous Infusions:  lactated ringers 10 mL/hr at 03/24/21 0750   PRN Meds:.acetaminophen **OR** acetaminophen, albuterol, meclizine, ondansetron **OR** ondansetron (ZOFRAN) IV   ASSESMENT:   Reported rectal bleeding PTA. 03/24/21 Colonoscopy: Ablation of 7 AVMs.  Multiple colon polyps, 4 large, largest 25 mm, were hot snared/resected/retrieved and polypectomy sites clipped.  Smaller 3 to 9 mm polyps throughout colon were not removed.  Nonbleeding int/ext rrhoids.   03/24/21 EGD: Small HH.  Gastritis.  Duodenal erythema.  No active bleeding or stigmata of recent bleeding.  No specimens collected.  Protonix 40 mg daily or equivalent recommended.  Blood loss anemia.  Hgb 9.7.  no PRBC to date.  Iron studies c/w IDA.  Feraheme infusion on 1/17     Chronic Plavix, held after 1/11 to allow for colonoscopy.   Dr Silverio Decamp ok'd restart 81 ASA but recommends neurology weigh in regarding need for long-term Plavix.  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   PLAN   GI signing off but will follow up on colon polyp pathology.  GI outpt fup prn.  PCP can follow anemia/CBC w referral to  heme if needed.    If Plavix needs restarted, suggest delaying this for 1 week to limit risk post-polypectomy bleeding.       Azucena Freed  03/25/2021, 8:53 AM Phone [phone]    Attending physician's note   I have taken a history, reviewed the chart and examined the patient. I performed a substantive portion of this encounter, including complete performance of at least one of the key components, in conjunction with the APP. I agree with the APP's note, impression and recommendations.   Hgb is stable , no evidence of ongoing GI bleed S/p EGD and colonoscopy yesterday  Please clarify with neurology if patient needs long  term Plavix? From GI standpoint, he is at risk for recurrent GI bleed and if there is no clear indication to restart Plavix then it will be beneficial for patient to stay off it [place] to restart ASA 81 mg daily  GI will sign off, please call with any questions   The patient was provided an opportunity to ask questions and all were answered. The patient agreed with the plan and demonstrated an understanding of the instructions.   Damaris Hippo , MD [phone]

## 2021-03-25 NOTE — Plan of Care (Signed)
°  Problem: Education: Goal: Knowledge of General Education information will improve Description: Including pain rating scale, medication(s)/side effects and non-pharmacologic comfort measures 03/25/2021 0501 by Nicholes Calamity, RN Outcome: Progressing 03/25/2021 0453 by Nicholes Calamity, RN Outcome: Progressing   Problem: Health Behavior/Discharge Planning: Goal: Ability to manage health-related needs will improve 03/25/2021 0501 by Nicholes Calamity, RN Outcome: Progressing 03/25/2021 0453 by Nicholes Calamity, RN Outcome: Progressing   Problem: Clinical Measurements: Goal: Ability to maintain clinical measurements within normal limits will improve 03/25/2021 0501 by Nicholes Calamity, RN Outcome: Progressing 03/25/2021 0453 by Nicholes Calamity, RN Outcome: Progressing Goal: Will remain free from infection 03/25/2021 0501 by Nicholes Calamity, RN Outcome: Progressing 03/25/2021 0453 by Nicholes Calamity, RN Outcome: Progressing Goal: Diagnostic test results will improve 03/25/2021 0501 by Nicholes Calamity, RN Outcome: Progressing 03/25/2021 0453 by Nicholes Calamity, RN Outcome: Progressing Goal: Respiratory complications will improve 03/25/2021 0501 by Nicholes Calamity, RN Outcome: Progressing 03/25/2021 0453 by Nicholes Calamity, RN Outcome: Progressing Goal: Cardiovascular complication will be avoided 03/25/2021 0501 by Nicholes Calamity, RN Outcome: Progressing 03/25/2021 0453 by Nicholes Calamity, RN Outcome: Progressing   Problem: Activity: Goal: Risk for activity intolerance will decrease 03/25/2021 0501 by Nicholes Calamity, RN Outcome: Progressing 03/25/2021 0453 by Nicholes Calamity, RN Outcome: Progressing   Problem: Nutrition: Goal: Adequate nutrition will be maintained 03/25/2021 0501 by Nicholes Calamity, RN Outcome: Progressing 03/25/2021 0453 by Nicholes Calamity, RN Outcome: Progressing   Problem: Coping: Goal: Level of anxiety will decrease 03/25/2021 0501 by Nicholes Calamity, RN Outcome: Progressing 03/25/2021 0453 by Nicholes Calamity, RN Outcome: Progressing   Problem: Elimination: Goal: Will not experience complications related to bowel motility 03/25/2021 0501 by Nicholes Calamity, RN Outcome: Progressing 03/25/2021 0453 by Nicholes Calamity, RN Outcome: Progressing Goal: Will not experience complications related to urinary retention 03/25/2021 0501 by Nicholes Calamity, RN Outcome: Progressing 03/25/2021 0453 by Nicholes Calamity, RN Outcome: Progressing   Problem: Pain Managment: Goal: General experience of comfort will improve 03/25/2021 0501 by Nicholes Calamity, RN Outcome: Progressing 03/25/2021 0453 by Nicholes Calamity, RN Outcome: Progressing   Problem: Safety: Goal: Ability to remain free from injury will improve 03/25/2021 0501 by Nicholes Calamity, RN Outcome: Progressing 03/25/2021 0453 by Nicholes Calamity, RN Outcome: Progressing   Problem: Skin Integrity: Goal: Risk for impaired skin integrity will decrease 03/25/2021 0501 by Nicholes Calamity, RN Outcome: Progressing 03/25/2021 0453 by Nicholes Calamity, RN Outcome: Progressing

## 2021-03-26 ENCOUNTER — Encounter (HOSPITAL_COMMUNITY): Payer: Self-pay | Admitting: Gastroenterology

## 2021-04-01 ENCOUNTER — Encounter: Payer: Self-pay | Admitting: Gastroenterology

## 2021-04-04 ENCOUNTER — Encounter (HOSPITAL_COMMUNITY): Payer: Self-pay | Admitting: Emergency Medicine

## 2021-04-04 ENCOUNTER — Other Ambulatory Visit: Payer: Self-pay

## 2021-04-04 ENCOUNTER — Emergency Department (HOSPITAL_COMMUNITY)
Admission: EM | Admit: 2021-04-04 | Discharge: 2021-04-04 | Disposition: A | Discharge status: Home / Self Care | Payer: Medicare Other | Attending: Emergency Medicine | Admitting: Emergency Medicine

## 2021-04-04 DIAGNOSIS — T83098A Other mechanical complication of other indwelling urethral catheter, initial encounter: Secondary | ICD-10-CM | POA: Insufficient documentation

## 2021-04-04 DIAGNOSIS — R339 Retention of urine, unspecified: Secondary | ICD-10-CM | POA: Insufficient documentation

## 2021-04-04 DIAGNOSIS — Z7982 Long term (current) use of aspirin: Secondary | ICD-10-CM | POA: Insufficient documentation

## 2021-04-04 DIAGNOSIS — T83091A Other mechanical complication of indwelling urethral catheter, initial encounter: Secondary | ICD-10-CM

## 2021-04-04 LAB — URINALYSIS, ROUTINE W REFLEX MICROSCOPIC
Bacteria, UA: NONE SEEN
Bilirubin Urine: NEGATIVE
Glucose, UA: NEGATIVE mg/dL
Ketones, ur: NEGATIVE mg/dL
Leukocytes,Ua: NEGATIVE
Nitrite: NEGATIVE
Protein, ur: 100 mg/dL — AB
RBC / HPF: >50 RBC/hpf — ABNORMAL HIGH (ref 0–5)
Specific Gravity, Urine: 1.025 (ref 1.005–1.030)
pH: 5 (ref 5.0–8.0)

## 2021-04-04 MED ORDER — LIDOCAINE HCL URETHRAL/MUCOSAL 2 % EX GEL
1.0000 | Freq: Once | CUTANEOUS | Status: AC
Start: 2021-04-04 — End: 2021-04-04
  Administered 2021-04-04: 1 via TOPICAL
  Filled 2021-04-04: qty 11

## 2021-04-04 NOTE — ED Triage Notes (Signed)
Patient arrived via [facility] with complaints that his foley catheter not draining and painful.

## 2021-04-04 NOTE — ED Notes (Signed)
Transport here to bring pt back to [facility].

## 2021-04-04 NOTE — ED Provider Notes (Signed)
[facility] EMERGENCY DEPARTMENT Provider Note   CSN: [id number] Arrival date & time: 04/04/21  0427     History  Chief Complaint  Patient presents with   Urinary Retention    Problem with previous catheter, not draining    Matthew Knight is a 81 y.o. male.  81 year old male the presents to the emergency department today secondary to clogged catheter.  Patient has a for urinary retention.  Has history of stroke.  He is in a rehab facility at this time.  Apparently has had decreased ability to drain recently so this and here for further evaluation.  Patient states he has a full bladder but no real severe pain associated with that.  No fevers.  No other associated symptoms.       Home Medications Prior to Admission medications   Medication Sig Start Date End Date Taking? Authorizing Provider  acetaminophen (TYLENOL) 325 MG tablet Take 2 tablets (650 mg total) by mouth every 4 (four) hours as needed for mild pain, moderate pain, fever or headache. 03/11/21   [name], MD  aspirin EC 81 MG tablet Take 81 mg by mouth daily.    [provider]  escitalopram (LEXAPRO) 20 MG tablet Take 20 mg by mouth daily. 07/11/17   [provider]  isosorbide mononitrate (IMDUR) 30 MG 24 hr tablet Take 30 mg by mouth daily. 04/28/20   [provider]  latanoprost (XALATAN) 0.005 % ophthalmic solution Place 1 drop into both eyes at bedtime.  07/27/17   [provider]  lisinopril (ZESTRIL) 40 MG tablet Take 40 mg by mouth daily. 02/01/20   [provider]  meclizine (ANTIVERT) 25 MG tablet Take 25 mg by mouth 3 (three) times daily as needed for dizziness.    [provider]  omeprazole (PRILOSEC) 40 MG capsule Take 1 capsule (40 mg total) by mouth daily. 03/25/21 04/24/21  [name], MD  pravastatin (PRAVACHOL) 20 MG tablet Take 20 mg by mouth daily. 04/28/20   [provider]  RHOPRESSA 0.02 % SOLN Place 1 drop  into both eyes daily at 6 (six) AM. 11/15/18   [provider]      Allergies    Darunavir and Sulfa antibiotics    Review of Systems   Review of Systems  Physical Exam Updated Vital Signs BP (!) 143/60    Pulse 89    Temp 98.2 F (36.8 C) (Oral)    Resp 18    SpO2 100%  Physical Exam Vitals and nursing note reviewed.  Constitutional:      Appearance: He is well-developed.  HENT:     Head: Normocephalic and atraumatic.     Mouth/Throat:     Mouth: Mucous membranes are moist.     Pharynx: Oropharynx is clear.  Eyes:     Conjunctiva/sclera: Conjunctivae normal.     Pupils: Pupils are equal, round, and reactive to light.  Cardiovascular:     Rate and Rhythm: Normal rate.  Pulmonary:     Effort: Pulmonary effort is normal. No respiratory distress.  Abdominal:     General: Abdomen is flat. There is no distension.     Tenderness: There is abdominal tenderness (Suprapubic area).  Musculoskeletal:        General: Normal range of motion.     Cervical back: Normal range of motion.  Skin:    General: Skin is warm and dry.  Neurological:     General: No focal deficit present.  Mental Status: He is alert.    ED Results / Procedures / Treatments   Labs (all labs ordered are listed, but only abnormal results are displayed) Labs Reviewed  URINALYSIS, ROUTINE W REFLEX MICROSCOPIC - Abnormal; Notable for the following components:      Result Value   Color, Urine AMBER (*)    APPearance TURBID (*)    Hgb urine dipstick LARGE (*)    Protein, ur 100 (*)    RBC / HPF >50 (*)    All other components within normal limits  URINE CULTURE    EKG None  Radiology No results found.  Procedures Procedures    Medications Ordered in ED Medications  lidocaine (XYLOCAINE) 2 % jelly 1 application (1 application Topical Given 04/04/21 0502)    ED Course/ Medical Decision Making/ A&P                           Medical Decision Making Amount and/or Complexity of Data  Reviewed Labs: ordered.   Will remove and replace catheter and get a urine sample.    Urine without obvious infection.  Catheter replaced.  Stable for discharge back to facility.   Final Clinical Impression(s) / ED Diagnoses Final diagnoses:  Obstruction of Foley catheter, initial encounter [facility])    Rx / [place] Orders ED Discharge Orders     None         Alee Katen, Corene Cornea, MD 04/04/21 [phone]

## 2021-04-04 NOTE — ED Notes (Signed)
PTAR CALLED  °

## 2021-04-05 LAB — URINE CULTURE: Culture: NO GROWTH

## 2021-04-08 ENCOUNTER — Emergency Department (HOSPITAL_COMMUNITY)
Admission: EM | Admit: 2021-04-08 | Discharge: 2021-04-09 | Disposition: A | Discharge status: Rehabilitation Facility | Payer: Medicare Other | Attending: Emergency Medicine | Admitting: Emergency Medicine

## 2021-04-08 ENCOUNTER — Encounter (HOSPITAL_COMMUNITY): Payer: Self-pay | Admitting: Emergency Medicine

## 2021-04-08 ENCOUNTER — Other Ambulatory Visit: Payer: Self-pay

## 2021-04-08 DIAGNOSIS — E119 Type 2 diabetes mellitus without complications: Secondary | ICD-10-CM | POA: Diagnosis not present

## 2021-04-08 DIAGNOSIS — R300 Dysuria: Secondary | ICD-10-CM | POA: Diagnosis present

## 2021-04-08 DIAGNOSIS — F0390 Unspecified dementia without behavioral disturbance: Secondary | ICD-10-CM | POA: Diagnosis not present

## 2021-04-08 DIAGNOSIS — Z7982 Long term (current) use of aspirin: Secondary | ICD-10-CM | POA: Diagnosis not present

## 2021-04-08 DIAGNOSIS — R339 Retention of urine, unspecified: Secondary | ICD-10-CM | POA: Diagnosis not present

## 2021-04-08 DIAGNOSIS — N390 Urinary tract infection, site not specified: Secondary | ICD-10-CM | POA: Insufficient documentation

## 2021-04-08 DIAGNOSIS — Z8673 Personal history of transient ischemic attack (TIA), and cerebral infarction without residual deficits: Secondary | ICD-10-CM | POA: Insufficient documentation

## 2021-04-08 LAB — URINALYSIS, ROUTINE W REFLEX MICROSCOPIC
Bilirubin Urine: NEGATIVE
Glucose, UA: NEGATIVE mg/dL
Ketones, ur: NEGATIVE mg/dL
Nitrite: NEGATIVE
Specific Gravity, Urine: 1.025 (ref 1.005–1.030)
pH: 6 (ref 5.0–8.0)

## 2021-04-08 LAB — BASIC METABOLIC PANEL
Anion gap: 7 (ref 5–15)
BUN: 20 mg/dL (ref 8–23)
CO2: 25 mmol/L (ref 22–32)
Calcium: 8.9 mg/dL (ref 8.9–10.3)
Chloride: 105 mmol/L (ref 98–111)
Creatinine, Ser: 1.19 mg/dL (ref 0.61–1.24)
GFR, Estimated: >60 mL/min (ref >60)
Glucose, Bld: 129 mg/dL — ABNORMAL HIGH (ref 70–99)
Potassium: 4.2 mmol/L (ref 3.5–5.1)
Sodium: 137 mmol/L (ref 135–145)

## 2021-04-08 LAB — CBC
HCT: 32 % — ABNORMAL LOW (ref 39.0–52.0)
Hemoglobin: 10.2 g/dL — ABNORMAL LOW (ref 13.0–17.0)
MCH: 28.3 pg (ref 26.0–34.0)
MCHC: 31.9 g/dL (ref 30.0–36.0)
MCV: 88.6 fL (ref 80.0–100.0)
Platelets: 416 10*3/uL — ABNORMAL HIGH (ref 150–400)
RBC: 3.61 MIL/uL — ABNORMAL LOW (ref 4.22–5.81)
RDW: 14.6 % (ref 11.5–15.5)
WBC: 9.2 10*3/uL (ref 4.0–10.5)
nRBC: 0 % (ref 0.0–0.2)

## 2021-04-08 MED ORDER — CEPHALEXIN 500 MG PO CAPS
500.0000 mg | ORAL_CAPSULE | Freq: Once | ORAL | Status: AC
Start: 2021-04-08 — End: 2021-04-08
  Administered 2021-04-08: 500 mg via ORAL
  Filled 2021-04-08: qty 1

## 2021-04-08 MED ORDER — CEPHALEXIN 500 MG PO CAPS: 500.0000 mg | ORAL_CAPSULE | Freq: Four times a day (QID) | ORAL | 0 refills | Status: AC

## 2021-04-08 NOTE — ED Notes (Signed)
Pt care taken, no complaints at this time. Waiting for results of labs.

## 2021-04-08 NOTE — Discharge Instructions (Signed)
Your history and exam and work-up today are consistent with a urinary tract infection causing the burning when you urinated.  You did not have any evidence of retention as you able to urinate without difficulty and we did not have to place any catheter today.  The urine did show evidence of infection and a culture was sent.  We gave you some antibiotics but we do feel you need a prescription for antibiotics as well.  Your kidney function was normal and other work-up was reassuring.  Your vital signs were reassuring and you had no other complaints.  We feel you are safe for discharge home with close PCP follow-up.  If any symptoms change, return, or worsen, please return to the nearest emergency department.

## 2021-04-08 NOTE — ED Provider Notes (Signed)
[facility] DEPT Provider Note   CSN: [id number] Arrival date & time: 04/08/21  1656     History  Chief Complaint  Patient presents with   Dysuria    Matthew Knight is a 81 y.o. male.  The history is provided by the patient and medical records. History limited by: dementia and hard of hearing. No language interpreter was used.  Dysuria Presenting symptoms: dysuria   Presenting symptoms: no penile discharge and no penile pain   Presenting symptoms comment:  Urinary retention per facility report to nursing  Relieved by:  Nothing Worsened by:  Nothing Ineffective treatments:  None tried Associated symptoms: urinary retention   Associated symptoms: no abdominal pain, no diarrhea, no fever, no flank pain, no groin pain, no nausea, no penile redness, no penile swelling, no priapism, no urinary incontinence and no vomiting       Home Medications Prior to Admission medications   Medication Sig Start Date End Date Taking? Authorizing Provider  acetaminophen (TYLENOL) 325 MG tablet Take 2 tablets (650 mg total) by mouth every 4 (four) hours as needed for mild pain, moderate pain, fever or headache. 03/11/21   [name], MD  aspirin EC 81 MG tablet Take 81 mg by mouth daily.    [provider]  escitalopram (LEXAPRO) 20 MG tablet Take 20 mg by mouth daily. 07/11/17   [provider]  isosorbide mononitrate (IMDUR) 30 MG 24 hr tablet Take 30 mg by mouth daily. 04/28/20   [provider]  latanoprost (XALATAN) 0.005 % ophthalmic solution Place 1 drop into both eyes at bedtime.  07/27/17   [provider]  lisinopril (ZESTRIL) 40 MG tablet Take 40 mg by mouth daily. 02/01/20   [provider]  meclizine (ANTIVERT) 25 MG tablet Take 25 mg by mouth 3 (three) times daily as needed for dizziness.    [provider]  omeprazole (PRILOSEC) 40 MG capsule Take 1 capsule (40 mg total) by mouth daily. 03/25/21  04/24/21  [name], MD  pravastatin (PRAVACHOL) 20 MG tablet Take 20 mg by mouth daily. 04/28/20   [provider]  RHOPRESSA 0.02 % SOLN Place 1 drop into both eyes daily at 6 (six) AM. 11/15/18   [provider]      Allergies    Darunavir and Sulfa antibiotics    Review of Systems   Review of Systems  Unable to perform ROS: Dementia  Constitutional:  Negative for chills, fatigue and fever.  HENT:  Negative for congestion.   Respiratory:  Negative for cough, chest tightness and shortness of breath.   Cardiovascular:  Negative for chest pain.  Gastrointestinal:  Negative for abdominal pain, diarrhea, nausea and vomiting.  Genitourinary:  Positive for difficulty urinating (per facility but not per pt initially) and dysuria. Negative for bladder incontinence, decreased urine volume, flank pain, penile discharge, penile pain, penile swelling and urgency.  Musculoskeletal:  Negative for back pain.  Neurological:  Negative for headaches.  Psychiatric/Behavioral:  Negative for agitation.   All other systems reviewed and are negative.  Physical Exam Updated Vital Signs BP 122/66 (BP Location: Left Arm)    Pulse 88    Temp 98 F (36.7 C) (Oral)    Resp 16    SpO2 98%  Physical Exam Vitals and nursing note reviewed. Exam conducted with a chaperone present.  Constitutional:      General: He is not in acute distress.    Appearance: He is well-developed. He  is not ill-appearing, toxic-appearing or diaphoretic.  HENT:     Head: Normocephalic and atraumatic.     Mouth/Throat:     Mouth: Mucous membranes are moist.  Eyes:     Conjunctiva/sclera: Conjunctivae normal.  Cardiovascular:     Rate and Rhythm: Normal rate and regular rhythm.     Heart sounds: No murmur heard. Pulmonary:     Effort: Pulmonary effort is normal. No respiratory distress.     Breath sounds: Normal breath sounds. No wheezing, rhonchi or rales.  Chest:     Chest wall: No tenderness.  Abdominal:      General: There is no distension.     Palpations: Abdomen is soft.     Tenderness: There is no abdominal tenderness. There is no right CVA tenderness, left CVA tenderness, guarding or rebound.  Genitourinary:    Penis: Normal. No tenderness.      Testes:        Right: Tenderness not present.        Left: Tenderness not present.     Comments: Chaperone present, no tenderness in the scrotum or testicles.  No tenderness in the penis.  No gross hematuria or penile bleeding seen.  No abdominal or suprapubic tenderness. Musculoskeletal:        General: No swelling or tenderness.     Cervical back: Neck supple.     Right lower leg: No edema.     Left lower leg: No edema.  Skin:    General: Skin is warm and dry.     Capillary Refill: Capillary refill takes less than 2 seconds.     Findings: No erythema.  Neurological:     General: No focal deficit present.     Mental Status: He is alert.  Psychiatric:        Mood and Affect: Mood normal.    ED Results / Procedures / Treatments   Labs (all labs ordered are listed, but only abnormal results are displayed) Labs Reviewed  URINALYSIS, ROUTINE W REFLEX MICROSCOPIC - Abnormal; Notable for the following components:      Result Value   Color, Urine YELLOW (*)    APPearance CLEAR (*)    Hgb urine dipstick MODERATE (*)    Protein, ur TRACE (*)    Leukocytes,Ua TRACE (*)    Bacteria, UA RARE (*)    All other components within normal limits  BASIC METABOLIC PANEL - Abnormal; Notable for the following components:   Glucose, Bld 129 (*)    All other components within normal limits  CBC - Abnormal; Notable for the following components:   RBC 3.61 (*)    Hemoglobin 10.2 (*)    HCT 32.0 (*)    Platelets 416 (*)    All other components within normal limits  URINE CULTURE    EKG None  Radiology No results found.  Procedures Procedures    Medications Ordered in ED Medications  cephALEXin (KEFLEX) capsule 500 mg (has no  administration in time range)    ED Course/ Medical Decision Making/ A&P                           Medical Decision Making Amount and/or Complexity of Data Reviewed Labs: ordered.  Risk Prescription drug management.   Matthew Knight is a 81 y.o. male with a past medical history significant for dementia, diabetes, previous stroke, previous iliac artery aneurysm, GI bleeding, and previous urinary retention who presents from facility  for dysuria and report of urinary retention.  According to patient, he has had some burning when he urinates but denies any difficulty with urination itself.  He denies any abdominal pain.  Denies nausea, vomiting, fevers, chills, constipation, diarrhea, or other complaints.  He does not report any blood in his urine.  He does state he had a Foley catheter recently but does not remember when it was taken out.  Review of the chart myself shows that he had a Foley catheter replaced 4 days ago and was discharged back to [facility] with it still in place.  It is unclear how or when it was removed but according to facility report to nursing, patient was having retention today and due to the dysuria presents for evaluation.  Patient is denying any other complaints at this time and does feel that he needs to urinate but has not yet done so.  He does not member the last time he urinated.  On exam, abdomen is nontender.  It is not distended.  Lungs are clear and chest is nontender.  Chaperone present in GU exam revealed nontender scrotum or testicles.  Penis nontender.  No gross hematuria seen.  As patient reports he is able to urinate, we will allow him to do so and will check urinalysis given his urinary symptoms.  With the report of retention, will check some blood work to look for AKI or other acute abnormality.  If patient is unable to urinate, will likely need Foley catheter replacement.  Per nursing, his bladder scan on arrival was about 200 cc of urine.  We  will await blood results and urinalysis.  Patient was able to urinate without any difficulty and urinated over 200 cc of urine likely the entire amount.  Given his dysuria, his urine did show leukocytes and bacteria so I do want to treat for UTI.  Culture was sent.  I reviewed previous cultures and did not see any evidence of resistances so we will treat with Keflex initially.  We will give a dose here.  His kidney function was normal and CBC otherwise did not show leukocytosis.  No evidence of pyelonephritis or other concerning infection at this time.  As he was able to urinate, there was no evidence of acute retention and patient is having no complaints.  He would like to go home and I agree.  Patient be discharged back to his facility for PCP follow-up and treatment of his UTI.         Final Clinical Impression(s) / ED Diagnoses Final diagnoses:  Lower urinary tract infectious disease  Dysuria    Rx / DC Orders ED Discharge Orders          Ordered    cephALEXin (KEFLEX) 500 MG capsule  4 times daily        04/08/21 2046            Clinical Impression: 1. Lower urinary tract infectious disease   2. Dysuria     Disposition: Discharge  Condition: Good  I have discussed the results, Dx and Tx plan with the pt(& family if present). He/she/they expressed understanding and agree(s) with the plan. Discharge instructions discussed at great length. Strict return precautions discussed and pt &/or family have verbalized understanding of the instructions. No further questions at time of discharge.    New Prescriptions   CEPHALEXIN (KEFLEX) 500 MG CAPSULE    Take 1 capsule (500 mg total) by mouth 4 (four) times daily for 10  days.    Follow Up: [name], MD [place] [id number] [facility] [address] [id number] mc [place]         Bertrice Leder, Gwenyth Allegra, MD 04/08/21 2046

## 2021-04-08 NOTE — ED Notes (Signed)
Able to void in urinal with assistance from nurse tech, 229ml amber urine out.  Patient did have some urine dribbling after he stated he was done urinating and complained of a burning sensation.

## 2021-04-08 NOTE — ED Triage Notes (Signed)
Per EMS-coming from [facility] retention since yesterday-facility unable to place catheter-sent to ED for eval

## 2021-04-10 LAB — URINE CULTURE: Culture: <10000 — AB

## 2021-07-01 ENCOUNTER — Other Ambulatory Visit: Payer: Self-pay

## 2021-07-01 ENCOUNTER — Inpatient Hospital Stay (HOSPITAL_COMMUNITY)
Admission: EM | Admit: 2021-07-01 | Discharge: 2021-07-04 | DRG: 379 | Disposition: A | Discharge status: Home Health | Payer: Medicare Other | Attending: Family Medicine | Admitting: Family Medicine

## 2021-07-01 ENCOUNTER — Encounter (HOSPITAL_COMMUNITY): Payer: Self-pay

## 2021-07-01 DIAGNOSIS — D500 Iron deficiency anemia secondary to blood loss (chronic): Secondary | ICD-10-CM | POA: Diagnosis present

## 2021-07-01 DIAGNOSIS — K2970 Gastritis, unspecified, without bleeding: Secondary | ICD-10-CM | POA: Diagnosis present

## 2021-07-01 DIAGNOSIS — K922 Gastrointestinal hemorrhage, unspecified: Secondary | ICD-10-CM | POA: Diagnosis not present

## 2021-07-01 DIAGNOSIS — K625 Hemorrhage of anus and rectum: Secondary | ICD-10-CM | POA: Diagnosis not present

## 2021-07-01 DIAGNOSIS — K921 Melena: Principal | ICD-10-CM | POA: Diagnosis present

## 2021-07-01 DIAGNOSIS — E119 Type 2 diabetes mellitus without complications: Secondary | ICD-10-CM | POA: Diagnosis present

## 2021-07-01 DIAGNOSIS — Z79899 Other long term (current) drug therapy: Secondary | ICD-10-CM

## 2021-07-01 DIAGNOSIS — I10 Essential (primary) hypertension: Secondary | ICD-10-CM | POA: Diagnosis present

## 2021-07-01 DIAGNOSIS — E785 Hyperlipidemia, unspecified: Secondary | ICD-10-CM | POA: Diagnosis present

## 2021-07-01 DIAGNOSIS — Z86010 Personal history of colonic polyps: Secondary | ICD-10-CM

## 2021-07-01 DIAGNOSIS — Z7982 Long term (current) use of aspirin: Secondary | ICD-10-CM

## 2021-07-01 DIAGNOSIS — F0390 Unspecified dementia without behavioral disturbance: Secondary | ICD-10-CM | POA: Diagnosis present

## 2021-07-01 DIAGNOSIS — Z8673 Personal history of transient ischemic attack (TIA), and cerebral infarction without residual deficits: Secondary | ICD-10-CM

## 2021-07-01 DIAGNOSIS — Z8719 Personal history of other diseases of the digestive system: Secondary | ICD-10-CM

## 2021-07-01 DIAGNOSIS — G459 Transient cerebral ischemic attack, unspecified: Secondary | ICD-10-CM | POA: Diagnosis present

## 2021-07-01 DIAGNOSIS — Z8249 Family history of ischemic heart disease and other diseases of the circulatory system: Secondary | ICD-10-CM

## 2021-07-01 DIAGNOSIS — Z833 Family history of diabetes mellitus: Secondary | ICD-10-CM

## 2021-07-01 DIAGNOSIS — Z66 Do not resuscitate: Secondary | ICD-10-CM | POA: Diagnosis present

## 2021-07-01 DIAGNOSIS — F17290 Nicotine dependence, other tobacco product, uncomplicated: Secondary | ICD-10-CM | POA: Diagnosis present

## 2021-07-01 DIAGNOSIS — Z882 Allergy status to sulfonamides status: Secondary | ICD-10-CM

## 2021-07-01 DIAGNOSIS — Z8619 Personal history of other infectious and parasitic diseases: Secondary | ICD-10-CM

## 2021-07-01 DIAGNOSIS — Z888 Allergy status to other drugs, medicaments and biological substances status: Secondary | ICD-10-CM

## 2021-07-01 DIAGNOSIS — K449 Diaphragmatic hernia without obstruction or gangrene: Secondary | ICD-10-CM | POA: Diagnosis present

## 2021-07-01 DIAGNOSIS — K219 Gastro-esophageal reflux disease without esophagitis: Secondary | ICD-10-CM | POA: Diagnosis present

## 2021-07-01 DIAGNOSIS — N400 Enlarged prostate without lower urinary tract symptoms: Secondary | ICD-10-CM

## 2021-07-01 DIAGNOSIS — H9190 Unspecified hearing loss, unspecified ear: Secondary | ICD-10-CM | POA: Diagnosis present

## 2021-07-01 LAB — COMPREHENSIVE METABOLIC PANEL
ALT: 16 U/L (ref 0–44)
AST: 18 U/L (ref 15–41)
Albumin: 3.7 g/dL (ref 3.5–5.0)
Alkaline Phosphatase: 66 U/L (ref 38–126)
Anion gap: 7 (ref 5–15)
BUN: 14 mg/dL (ref 8–23)
CO2: 23 mmol/L (ref 22–32)
Calcium: 8.9 mg/dL (ref 8.9–10.3)
Chloride: 111 mmol/L (ref 98–111)
Creatinine, Ser: 0.99 mg/dL (ref 0.61–1.24)
GFR, Estimated: >60 mL/min (ref >60)
Glucose, Bld: 109 mg/dL — ABNORMAL HIGH (ref 70–99)
Potassium: 3.8 mmol/L (ref 3.5–5.1)
Sodium: 141 mmol/L (ref 135–145)
Total Bilirubin: 0.6 mg/dL (ref 0.3–1.2)
Total Protein: 6.9 g/dL (ref 6.5–8.1)

## 2021-07-01 LAB — CBC
HCT: 28.9 % — ABNORMAL LOW (ref 39.0–52.0)
Hemoglobin: 8.7 g/dL — ABNORMAL LOW (ref 13.0–17.0)
MCH: 22.1 pg — ABNORMAL LOW (ref 26.0–34.0)
MCHC: 30.1 g/dL (ref 30.0–36.0)
MCV: 73.5 fL — ABNORMAL LOW (ref 80.0–100.0)
Platelets: 392 10*3/uL (ref 150–400)
RBC: 3.93 MIL/uL — ABNORMAL LOW (ref 4.22–5.81)
RDW: 16.3 % — ABNORMAL HIGH (ref 11.5–15.5)
WBC: 9.7 10*3/uL (ref 4.0–10.5)
nRBC: 0 % (ref 0.0–0.2)

## 2021-07-01 LAB — PROTIME-INR
INR: 1.1 (ref 0.8–1.2)
Prothrombin Time: 13.9 seconds (ref 11.4–15.2)

## 2021-07-01 LAB — HEMOGLOBIN AND HEMATOCRIT, BLOOD
HCT: 28.4 % — ABNORMAL LOW (ref 39.0–52.0)
Hemoglobin: 8.4 g/dL — ABNORMAL LOW (ref 13.0–17.0)

## 2021-07-01 MED ORDER — FOLIC ACID 1 MG PO TABS
0.5000 mg | ORAL_TABLET | Freq: Every day | ORAL | Status: DC
  Administered 2021-07-02 – 2021-07-04 (×3): 0.5 mg via ORAL
  Filled 2021-07-01 (×3): qty 1

## 2021-07-01 MED ORDER — LATANOPROST 0.005 % OP SOLN
1.0000 [drp] | Freq: Every day | OPHTHALMIC | Status: DC
  Administered 2021-07-01 – 2021-07-03 (×3): 1 [drp] via OPHTHALMIC
  Filled 2021-07-01: qty 2.5

## 2021-07-01 MED ORDER — MECLIZINE HCL 25 MG PO TABS
25.0000 mg | ORAL_TABLET | Freq: Three times a day (TID) | ORAL | Status: DC | PRN
  Administered 2021-07-01: 25 mg via ORAL
  Filled 2021-07-01: qty 1

## 2021-07-01 MED ORDER — ISOSORBIDE MONONITRATE ER 30 MG PO TB24
30.0000 mg | ORAL_TABLET | Freq: Every day | ORAL | Status: DC
  Administered 2021-07-02 – 2021-07-04 (×3): 30 mg via ORAL
  Filled 2021-07-01 (×3): qty 1

## 2021-07-01 MED ORDER — ESCITALOPRAM OXALATE 20 MG PO TABS
20.0000 mg | ORAL_TABLET | Freq: Every day | ORAL | Status: DC
Start: 2021-07-01 — End: 2021-07-04
  Administered 2021-07-01 – 2021-07-04 (×4): 20 mg via ORAL
  Filled 2021-07-01 (×4): qty 1

## 2021-07-01 MED ORDER — PANTOPRAZOLE SODIUM 40 MG IV SOLR
40.0000 mg | INTRAVENOUS | Status: DC
Start: 2021-07-01 — End: 2021-07-04
  Administered 2021-07-01 – 2021-07-03 (×3): 40 mg via INTRAVENOUS
  Filled 2021-07-01 (×3): qty 10

## 2021-07-01 MED ORDER — PRAVASTATIN SODIUM 20 MG PO TABS
20.0000 mg | ORAL_TABLET | Freq: Every day | ORAL | Status: DC
Start: 2021-07-01 — End: 2021-07-04
  Administered 2021-07-01 – 2021-07-04 (×4): 20 mg via ORAL
  Filled 2021-07-01 (×4): qty 1

## 2021-07-01 MED ORDER — NETARSUDIL DIMESYLATE 0.02 % OP SOLN: 1.0000 [drp] | Freq: Every day | OPHTHALMIC | Status: DC

## 2021-07-01 MED ORDER — LISINOPRIL 10 MG PO TABS
10.0000 mg | ORAL_TABLET | Freq: Every day | ORAL | Status: DC
  Administered 2021-07-02 – 2021-07-04 (×3): 10 mg via ORAL
  Filled 2021-07-01 (×3): qty 1

## 2021-07-01 NOTE — H&P (Addendum)
?History and Physical  ? ? ?Patient: Matthew Knight EGB:[id number] DOB: 1941/02/25 ?DOA: 07/01/2021 ?DOS: the patient was seen and examined on 07/01/2021 ?PCP: [name], MD  ?Patient coming from: SNF ? ?Chief Complaint:  ?Chief Complaint  ?Patient presents with  ? Rectal Bleeding  ? ?HPI: Matthew Knight is a 81 y.o. male with medical history significant of dementia, TIA, HTN, HLD. Presenting with rectal bleeding. History per daughter. The patient has assistance aides from the [place]. They came by to check on him this morning. When they were helping him change clothes, they noticed that he had bloody stool and blood in the toilet. They became concerned and called for EMS. Of note, she reports that the patient had his DAPT held in January of this year d/t GIB. He was instructed to resume his ASA only about 3 weeks ago. Today is the first time that anyone has noticed bleeding since resuming his ASA.  ? ?Review of Systems: unable to review all systems due to the inability of the patient to answer questions. ?Past Medical History:  ?Diagnosis Date  ? Dementia ([place])   ? Diabetes mellitus without complication ([place])   ? Hearing loss   ? Hepatitis   ? Small vessel disease ([place])   ? Stroke [facility])   ? ?Past Surgical History:  ?Procedure Laterality Date  ? APPENDECTOMY    ? COLONOSCOPY WITH PROPOFOL N/A 03/24/2021  ? Procedure: COLONOSCOPY WITH PROPOFOL;  Surgeon: Mauri Pole, MD;  Location: [facility];  Service: Endoscopy;  Laterality: N/A;  ? ESOPHAGOGASTRODUODENOSCOPY (EGD) WITH PROPOFOL N/A 03/24/2021  ? Procedure: ESOPHAGOGASTRODUODENOSCOPY (EGD) WITH PROPOFOL;  Surgeon: Mauri Pole, MD;  Location: [facility];  Service: Endoscopy;  Laterality: N/A;  ? HEMOSTASIS CLIP PLACEMENT  03/24/2021  ? Procedure: HEMOSTASIS CLIP PLACEMENT;  Surgeon: Mauri Pole, MD;  Location: [facility];  Service: Endoscopy;;  ? HOT HEMOSTASIS N/A 03/24/2021  ? Procedure: HOT HEMOSTASIS (ARGON PLASMA  COAGULATION/BICAP);  Surgeon: Mauri Pole, MD;  Location: [facility];  Service: Endoscopy;  Laterality: N/A;  ? POLYPECTOMY  03/24/2021  ? Procedure: POLYPECTOMY;  Surgeon: Mauri Pole, MD;  Location: [facility] ENDOSCOPY;  Service: Endoscopy;;  ? ?Social History:  reports that he has been smoking pipe. He has never used smokeless tobacco. He reports that he does not drink alcohol and does not use drugs. ? ?Allergies  ?Allergen Reactions  ? Darunavir Other (See Comments)  ?  Other reaction(s): Eruption of skin  ? Sulfa Antibiotics   ?  Other reaction(s): Other (See Comments) ?States made him crazy  ? ? ?Family History  ?Problem Relation Age of Onset  ? Diabetes Mother   ? Heart disease Father   ? Memory loss Paternal Grandfather   ? ? ?Prior to Admission medications   ?Medication Sig Start Date End Date Taking? Authorizing Provider  ?acetaminophen (TYLENOL) 325 MG tablet Take 2 tablets (650 mg total) by mouth every 4 (four) hours as needed for mild pain, moderate pain, fever or headache. 03/11/21   [name], MD  ?aspirin EC 81 MG tablet Take 81 mg by mouth daily.    [provider]  ?escitalopram (LEXAPRO) 20 MG tablet Take 20 mg by mouth daily. 07/11/17   [provider]  ?isosorbide mononitrate (IMDUR) 30 MG 24 hr tablet Take 30 mg by mouth daily. 04/28/20   [provider]  ?latanoprost (XALATAN) 0.005 % ophthalmic solution Place 1 drop into both eyes at bedtime.  07/27/17   [provider]  ?lisinopril (ZESTRIL) 40 MG tablet Take 40 mg by mouth daily. 02/01/20   [provider]  ?meclizine (ANTIVERT) 25 MG tablet Take 25 mg by mouth 3 (three) times daily as needed for dizziness.    [provider]  ?omeprazole (PRILOSEC) 40 MG capsule Take 1 capsule (40 mg total) by mouth daily. 03/25/21 04/24/21  [name], MD  ?pravastatin (PRAVACHOL) 20 MG tablet Take 20 mg by mouth daily. 04/28/20   [provider]  ?RHOPRESSA 0.02 % SOLN Place 1 drop  into both eyes daily at 6 (six) AM. 11/15/18   [provider]  ? ? ?Physical Exam: ?Vitals:  ? 07/01/21 1133 07/01/21 1134 07/01/21 1230 07/01/21 1300  ?BP:   (!) 147/69 113/62  ?Pulse:   79 69  ?Resp:   12 18  ?Temp: (!) 97.4 ?F (36.3 ?C)     ?TempSrc: Oral     ?SpO2:   94% 95%  ?Height:  6' (1.829 m)    ? ?General: 81 y.o. male resting in bed in NAD ?Eyes: PERRL, normal sclera ?ENMT: Nares patent w/o discharge, orophaynx clear, dentition normal, ears w/o discharge/lesions/ulcers ?Neck: Supple, trachea midline ?Cardiovascular: RRR, +S1, S2, no m/g/r, equal pulses throughout ?Respiratory: CTABL, no w/r/r, normal WOB ?GI: BS+, NDNT, no masses noted, no organomegaly noted ?MSK: No e/c/c ?Neuro: A&O x name only, no focal deficits but exam limited d/t patient's difficulty w/ following instructions ?Psyc: pleasantly demented ? ?Data Reviewed: ? ?Hgb  8.7 ?MCV  73.5 ? ?Assessment and Plan: ?No notes have been filed under this hospital service. ?Service: Hospitalist ?GIB ?    - place in obs, tele ?    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    - [facility] consulted, appreciate assistance ?    - Hgb baseline is closer to 10; he is 8.7 today ?    - q6h H&H, transfuse for Hgb < 7 ?    - NPO until eval'd by GI ? ?Dementia ?    - continue home regimen when confirmed and off NPO status ?    - delirium precautions ? ?GERD ?    - protonix ? ?HLD ?    - continue home regimen when confirmed and off NPO status ?    ?Hx of TIA ?    - previously on ASA and plavix, but then reduced to ASA only d/t recent GIB ?    - ASA held today d/t GIB; continue statin when off NPO status ? ?BPH ?    - resume home regimen when confirmed and off NPO status ? ?HTN ?    - resume home regimen when confirmed and off NPO status ? ?Advance Care Planning:   Code Status: DNR confirmed w/ dtr by phone ? ?Consults: EDP spoke with [facility] ? ?Family Communication: w/ daughter by  phone ? ?Severity of Illness: ?The appropriate patient status for this patient is OBSERVATION. Observation status is judged to be reasonable and necessary in order to provide the required intensity of service to ensure the patient's safety. The patient's presenting symptoms, physical exam findings, and initial radiographic and laboratory data in the context of their medical condition is felt to place them at decreased risk for further clinical deterioration. Furthermore, it is anticipated that the patient will be medically stable for discharge from the hospital within 2 midnights of admission.  ? ?Author: ?Jonnie Finner, DO ?07/01/2021 1:54  PM ? ?For on call review [email or web address].  ?

## 2021-07-01 NOTE — ED Notes (Signed)
Spoke with daughter. Daughter would like an update once a plan is in progress for the patient. ?

## 2021-07-01 NOTE — ED Notes (Signed)
Unable to obtain full set of orthostatic vitals. Pt c/o pain and dizziness when he was sitting on the side of the bed. Pt requested to lay back down. ?

## 2021-07-01 NOTE — ED Notes (Signed)
Fecal occult grossly positive. Order dc'ed per verbal order from [facility] MD ?

## 2021-07-01 NOTE — ED Triage Notes (Signed)
Pt BIBA from [facility], noted to have 500-659m blood loss in toilet and on floor. Somewhat pale. Denies CP, SHOB, dizziness, nausea. No complaints. Mild LLQ distention without guarding or tenderness. Hx dementia, mentation at baseline ? ?152/76 ?HR 80s ?CBG 105 ?100% RA ? RR 16 ?

## 2021-07-01 NOTE — Consult Note (Signed)
Referring Provider: TRH ?Primary Care Physician:  [name], MD ?Primary Gastroenterologist:  Althia Forts ? ?Reason for Consultation:  GI bleed ? ?HPI: Matthew Knight is a 81 y.o. male with medical history significant of dementia, TIA, HTN, HLD. Presenting with rectal bleeding.  ? ?History per chart review, patient unable to provide history due to dementia, no family present. The patient has assistance aides from the [place], when they were helping him change clothes this morning they noticed that he had bloody stool and blood in the toilet. He was then transported to the hospital via EMS.  ? ?He was hospitalized 03/19/2021 for similar rectal bleeding.  ? ?03/24/21 Colonoscopy: Ablation of 7 AVMs.  Multiple colon polyps, 4 large, largest 25 mm, were hot snared/resected/retrieved and polypectomy sites clipped.  Smaller 3 to 9 mm polyps throughout colon were not removed.  Nonbleeding int/ext rrhoids.   ? ?03/24/21 EGD: Small HH.  Gastritis.  Duodenal erythema.  No active bleeding or stigmata of recent bleeding.  No specimens collected.  Protonix 40 mg daily or equivalent recommended. ? ?Patient had his Plavix held and was recommended to start Protonix 40 mg once daily.  He was instructed to resume his ASA only about 3 weeks ago. Today is the first time that anyone has noticed bleeding since resuming his ASA.  ? ?Past Medical History:  ?Diagnosis Date  ? Dementia ([place])   ? Diabetes mellitus without complication ([place])   ? Hearing loss   ? Hepatitis   ? Small vessel disease ([place])   ? Stroke [facility])   ? ? ?Past Surgical History:  ?Procedure Laterality Date  ? APPENDECTOMY    ? COLONOSCOPY WITH PROPOFOL N/A 03/24/2021  ? Procedure: COLONOSCOPY WITH PROPOFOL;  Surgeon: Mauri Pole, MD;  Location: [facility];  Service: Endoscopy;  Laterality: N/A;  ? ESOPHAGOGASTRODUODENOSCOPY (EGD) WITH PROPOFOL N/A 03/24/2021  ? Procedure: ESOPHAGOGASTRODUODENOSCOPY (EGD) WITH PROPOFOL;  Surgeon: Mauri Pole, MD;  Location:  [facility];  Service: Endoscopy;  Laterality: N/A;  ? HEMOSTASIS CLIP PLACEMENT  03/24/2021  ? Procedure: HEMOSTASIS CLIP PLACEMENT;  Surgeon: Mauri Pole, MD;  Location: [facility];  Service: Endoscopy;;  ? HOT HEMOSTASIS N/A 03/24/2021  ? Procedure: HOT HEMOSTASIS (ARGON PLASMA COAGULATION/BICAP);  Surgeon: Mauri Pole, MD;  Location: [facility] ENDOSCOPY;  Service: Endoscopy;  Laterality: N/A;  ? POLYPECTOMY  03/24/2021  ? Procedure: POLYPECTOMY;  Surgeon: Mauri Pole, MD;  Location: [facility] ENDOSCOPY;  Service: Endoscopy;;  ? ? ?Prior to Admission medications   ?Medication Sig Start Date End Date Taking? Authorizing Provider  ?acetaminophen (TYLENOL) 325 MG tablet Take 2 tablets (650 mg total) by mouth every 4 (four) hours as needed for mild pain, moderate pain, fever or headache. 03/11/21  Yes [name], MD  ?aspirin EC 81 MG tablet Take 81 mg by mouth daily.   Yes [provider]  ?escitalopram (LEXAPRO) 20 MG tablet Take 20 mg by mouth daily. 07/11/17  Yes [provider]  ?folic acid (FOLVITE) 606 MCG tablet Take 400 mcg by mouth daily.   Yes [provider]  ?isosorbide mononitrate (IMDUR) 30 MG 24 hr tablet Take 30 mg by mouth daily. 04/28/20  Yes [provider]  ?latanoprost (XALATAN) 0.005 % ophthalmic solution Place 1 drop into both eyes at bedtime.  07/27/17  Yes [provider]  ?lisinopril (ZESTRIL) 10 MG tablet Take 10 mg by mouth daily. 02/01/20  Yes [provider]  ?meclizine (ANTIVERT) 25 MG tablet Take 25 mg by  mouth 3 (three) times daily as needed for dizziness.   Yes [provider]  ?omeprazole (PRILOSEC) 40 MG capsule Take 1 capsule (40 mg total) by mouth daily. 03/25/21 07/01/21 Yes Pahwani, Einar Grad, MD  ?pravastatin (PRAVACHOL) 20 MG tablet Take 20 mg by mouth daily. 04/28/20  Yes [provider]  ?RHOPRESSA 0.02 % SOLN Place 1 drop into both eyes daily at 6 (six) AM. 11/15/18  Yes [provider]   ?silodosin (RAPAFLO) 8 MG CAPS capsule Take 8 mg by mouth daily. 06/13/21  Yes [provider]  ? ? ?Scheduled Meds: ? pantoprazole (PROTONIX) IV  40 mg Intravenous Q24H  ? ?Continuous Infusions: ?PRN Meds:. ? ?Allergies as of 07/01/2021 - Review Complete 07/01/2021  ?Allergen Reaction Noted  ? Darunavir Other (See Comments) 09/19/2017  ? Exelon [rivastigmine] Other (See Comments) 07/01/2021  ? Sulfa antibiotics  04/23/2015  ? ? ?Family History  ?Problem Relation Age of Onset  ? Diabetes Mother   ? Heart disease Father   ? Memory loss Paternal Grandfather   ? ? ?Social History  ? ?Socioeconomic History  ? Marital status: Married  ?  Spouse name: Not on file  ? Number of children: Not on file  ? Years of education: Not on file  ? Highest education level: Not on file  ?Occupational History  ? Not on file  ?Tobacco Use  ? Smoking status: Every Day  ?  Types: Pipe  ? Smokeless tobacco: Never  ?Vaping Use  ? Vaping Use: Never used  ?Substance and Sexual Activity  ? Alcohol use: No  ?  Alcohol/week: 0.0 standard drinks  ? Drug use: No  ? Sexual activity: Not on file  ?Other Topics Concern  ? Not on file  ?Social History Narrative  ? Not on file  ? ?Social Determinants of Health  ? ?Financial Resource Strain: Not on file  ?Food Insecurity: Not on file  ?Transportation Needs: Not on file  ?Physical Activity: Not on file  ?Stress: Not on file  ?Social Connections: Not on file  ?Intimate Partner Violence: Not on file  ? ? ?Review of Systems: Review of Systems  ?Unable to perform ROS: Dementia   ? ?Physical Exam:Physical Exam ?Constitutional:   ?   Appearance: He is normal weight.  ?HENT:  ?   Head: Normocephalic and atraumatic.  ?   Right Ear: External ear normal.  ?   Left Ear: External ear normal.  ?   Nose: Nose normal.  ?   Mouth/Throat:  ?   Mouth: Mucous membranes are moist.  ?Eyes:  ?   Pupils: Pupils are equal, round, and reactive to light.  ?   Comments: Conjunctival pallor  ?Cardiovascular:  ?   Rate and  Rhythm: Normal rate and regular rhythm.  ?   Pulses: Normal pulses.  ?   Heart sounds: Normal heart sounds.  ?Pulmonary:  ?   Effort: Pulmonary effort is normal.  ?   Breath sounds: Normal breath sounds.  ?Abdominal:  ?   General: Abdomen is flat. Bowel sounds are normal. There is no distension.  ?   Palpations: Abdomen is soft. There is no mass.  ?   Tenderness: There is no abdominal tenderness. There is no guarding or rebound.  ?   Hernia: No hernia is present.  ?Musculoskeletal:     ?   General: Normal range of motion.  ?   Cervical back: Normal range of motion.  ?Skin: ?   General: Skin is warm  and dry.  ?   Coloration: Skin is pale.  ?Neurological:  ?   Mental Status: He is alert. He is disoriented.  ?  ?Vital signs: ?Vitals:  ? 07/01/21 1230 07/01/21 1300  ?BP: (!) 147/69 113/62  ?Pulse: 79 69  ?Resp: 12 18  ?Temp:    ?SpO2: 94% 95%  ? ?  ? ?GI:  ?Lab Results: ?Recent Labs  ?  07/01/21 ?1159  ?WBC 9.7  ?HGB 8.7*  ?HCT 28.9*  ?PLT 392  ? ?BMET ?Recent Labs  ?  07/01/21 ?1159  ?NA 141  ?K 3.8  ?CL 111  ?CO2 23  ?GLUCOSE 109*  ?BUN 14  ?CREATININE 0.99  ?CALCIUM 8.9  ? ?LFT ?Recent Labs  ?  07/01/21 ?1159  ?PROT 6.9  ?ALBUMIN 3.7  ?AST 18  ?ALT 16  ?ALKPHOS 66  ?BILITOT 0.6  ? ?PT/INR ?Recent Labs  ?  07/01/21 ?1159  ?LABPROT 13.9  ?INR 1.1  ? ? ? ?Studies/Results: ?No results found. ? ?Impression: ?GI bleed ?Anemia ? ?03/24/21 Colonoscopy: Ablation of 7 AVMs.  Multiple colon polyps, 4 large, largest 25 mm, were hot snared/resected/retrieved and polypectomy sites clipped.  Smaller 3 to 9 mm polyps throughout colon were not removed.  Nonbleeding int/ext rrhoids.   ? ?03/24/21 EGD: Small HH.  Gastritis.  Duodenal erythema.  No active bleeding or stigmata of recent bleeding.  No specimens collected.  Protonix 40 mg daily or equivalent recommended. ? ?Previous multiple colonic AVMs, multiple polypectomies, previously seen hemorrhoids.  More likely lower GI bleed.  Previous EGD with no signs of bleeding, no reported  melena, no elevation of BUN to creatinine ratio upper GI bleed less likely. ? ?07/01/2021 hemoglobin 8.7, baseline around 10. ? ?Plan: ?Continue supportive care ?Continue to monitor hemoglobin we will transfuse if

## 2021-07-01 NOTE — ED Provider Notes (Signed)
?[name] DEPT ?Provider Note ? ? ?CSN: [id number] ?Arrival date & time: 07/01/21  1120 ? ?LEVEL 5 CAVEAT - DEMENTIA  ? ?History ? ?Chief Complaint  ?Patient presents with  ? Rectal Bleeding  ? ? ?Matthew Knight is a 81 y.o. male. ? ?HPI ?81 year old male presents from [facility] with rectal bleeding.  History is limited as EMS is no longer present and the patient has dementia.  The nurse reports that he was noted to lose around 500 cc of blood in the toilet and on the floor.  He denies any complaints specifically and seems to be at his mental baseline.  Further history is pretty limited at this point. ? ?Home Medications ?Prior to Admission medications   ?Medication Sig Start Date End Date Taking? Authorizing Provider  ?acetaminophen (TYLENOL) 325 MG tablet Take 2 tablets (650 mg total) by mouth every 4 (four) hours as needed for mild pain, moderate pain, fever or headache. 03/11/21  Yes [name], MD  ?aspirin EC 81 MG tablet Take 81 mg by mouth daily.   Yes [provider]  ?escitalopram (LEXAPRO) 20 MG tablet Take 20 mg by mouth daily. 07/11/17  Yes [provider]  ?folic acid (FOLVITE) 254 MCG tablet Take 400 mcg by mouth daily.   Yes [provider]  ?isosorbide mononitrate (IMDUR) 30 MG 24 hr tablet Take 30 mg by mouth daily. 04/28/20  Yes [provider]  ?latanoprost (XALATAN) 0.005 % ophthalmic solution Place 1 drop into both eyes at bedtime.  07/27/17  Yes [provider]  ?lisinopril (ZESTRIL) 10 MG tablet Take 10 mg by mouth daily. 02/01/20  Yes [provider]  ?meclizine (ANTIVERT) 25 MG tablet Take 25 mg by mouth 3 (three) times daily as needed for dizziness.   Yes [provider]  ?omeprazole (PRILOSEC) 40 MG capsule Take 1 capsule (40 mg total) by mouth daily. 03/25/21 07/01/21 Yes Pahwani, Einar Grad, MD  ?pravastatin (PRAVACHOL) 20 MG tablet Take 20 mg by mouth daily. 04/28/20  Yes [provider]  ?RHOPRESSA 0.02 % SOLN Place 1 drop into both eyes daily at 6 (six) AM. 11/15/18  Yes [provider]  ?silodosin (RAPAFLO) 8 MG CAPS capsule Take 8 mg by mouth daily. 06/13/21  Yes [provider]  ?   ? ?Allergies    ?Darunavir, Exelon [rivastigmine], and Sulfa antibiotics   ? ?Review of Systems   ?Review of Systems  ?Unable to perform ROS: Dementia  ? ?Physical Exam ?Updated Vital Signs ?BP 114/61 (BP Location: Left Arm)   Pulse 79   Temp 98.3 ?F (36.8 ?C)   Resp 16   Ht 6' (1.829 m)   SpO2 100%   BMI 29.84 kg/m?  ?Physical Exam ?Vitals and nursing note reviewed. Exam conducted with a chaperone present.  ?Constitutional:   ?   Appearance: He is well-developed.  ?HENT:  ?   Head: Normocephalic and atraumatic.  ?Cardiovascular:  ?   Rate and Rhythm: Normal rate and regular rhythm.  ?   Heart sounds: Normal heart sounds.  ?Pulmonary:  ?   Effort: Pulmonary effort is normal.  ?   Breath sounds: Normal breath sounds.  ?Abdominal:  ?   Palpations: Abdomen is soft.  ?   Tenderness: There is no abdominal tenderness.  ?Genitourinary: ?   Comments: Dark red blood on digital rectal exam. No obvious masses. ?Skin: ?   General: Skin is warm and dry.  ?Neurological:  ?  Mental Status: He is alert.  ? ? ?ED Results / Procedures / Treatments   ?Labs ?(all labs ordered are listed, but only abnormal results are displayed) ?Labs Reviewed  ?COMPREHENSIVE METABOLIC PANEL - Abnormal; Notable for the following components:  ?    Result Value  ? Glucose, Bld 109 (*)   ? All other components within normal limits  ?CBC - Abnormal; Notable for the following components:  ? RBC 3.93 (*)   ? Hemoglobin 8.7 (*)   ? HCT 28.9 (*)   ? MCV 73.5 (*)   ? MCH 22.1 (*)   ? RDW 16.3 (*)   ? All other components within normal limits  ?PROTIME-INR  ?HEMOGLOBIN AND HEMATOCRIT, BLOOD  ?HEMOGLOBIN AND HEMATOCRIT, BLOOD  ?TYPE AND SCREEN  ? ? ?EKG ?None ? ?Radiology ?No results found. ? ?Procedures ?Procedures  ? ? ?Medications  Ordered in ED ?Medications  ?pantoprazole (PROTONIX) injection 40 mg (has no administration in time range)  ? ? ?ED Course/ Medical Decision Making/ A&P ?  ?                        ?Medical Decision Making ?Amount and/or Complexity of Data Reviewed ?Labs: ordered. ? ?Risk ?Decision regarding hospitalization. ? ? ?Patient presents with recurrent GI bleeding.  Suspect lower source like last time.  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  Had to be treated.  At this point, he does have blood on rectal exam but no other bleeding per rectum.  Hemoglobin is a little lower than when he left at 8-1/2 versus 10.  At this point, I think he will need repeat admission and so I discussed with [facility] gastroenterology and the hospitalist, Dr. Marylyn Ishihara.  Updated his daughter.  ? ? ? ? ? ? ? ?Final Clinical Impression(s) / ED Diagnoses ?Final diagnoses:  ?Acute GI bleeding  ? ? ?Rx / DC Orders ?ED Discharge Orders   ? ? None  ? ?  ? ? ?  ?Sherwood Gambler, MD ?07/01/21 1516 ? ?

## 2021-07-02 DIAGNOSIS — K2970 Gastritis, unspecified, without bleeding: Secondary | ICD-10-CM | POA: Diagnosis present

## 2021-07-02 DIAGNOSIS — Z8719 Personal history of other diseases of the digestive system: Secondary | ICD-10-CM | POA: Diagnosis not present

## 2021-07-02 DIAGNOSIS — E119 Type 2 diabetes mellitus without complications: Secondary | ICD-10-CM | POA: Diagnosis present

## 2021-07-02 DIAGNOSIS — G459 Transient cerebral ischemic attack, unspecified: Secondary | ICD-10-CM | POA: Diagnosis not present

## 2021-07-02 DIAGNOSIS — W19XXXA Unspecified fall, initial encounter: Secondary | ICD-10-CM | POA: Diagnosis not present

## 2021-07-02 DIAGNOSIS — Z66 Do not resuscitate: Secondary | ICD-10-CM | POA: Diagnosis present

## 2021-07-02 DIAGNOSIS — Z7982 Long term (current) use of aspirin: Secondary | ICD-10-CM | POA: Diagnosis not present

## 2021-07-02 DIAGNOSIS — F0390 Unspecified dementia without behavioral disturbance: Secondary | ICD-10-CM | POA: Diagnosis present

## 2021-07-02 DIAGNOSIS — F17290 Nicotine dependence, other tobacco product, uncomplicated: Secondary | ICD-10-CM | POA: Diagnosis present

## 2021-07-02 DIAGNOSIS — D500 Iron deficiency anemia secondary to blood loss (chronic): Secondary | ICD-10-CM | POA: Diagnosis present

## 2021-07-02 DIAGNOSIS — Z8249 Family history of ischemic heart disease and other diseases of the circulatory system: Secondary | ICD-10-CM | POA: Diagnosis not present

## 2021-07-02 DIAGNOSIS — K922 Gastrointestinal hemorrhage, unspecified: Secondary | ICD-10-CM | POA: Diagnosis not present

## 2021-07-02 DIAGNOSIS — I10 Essential (primary) hypertension: Secondary | ICD-10-CM

## 2021-07-02 DIAGNOSIS — N400 Enlarged prostate without lower urinary tract symptoms: Secondary | ICD-10-CM | POA: Diagnosis present

## 2021-07-02 DIAGNOSIS — K219 Gastro-esophageal reflux disease without esophagitis: Secondary | ICD-10-CM | POA: Diagnosis present

## 2021-07-02 DIAGNOSIS — Z86010 Personal history of colonic polyps: Secondary | ICD-10-CM | POA: Diagnosis not present

## 2021-07-02 DIAGNOSIS — Y92009 Unspecified place in unspecified non-institutional (private) residence as the place of occurrence of the external cause: Secondary | ICD-10-CM | POA: Diagnosis not present

## 2021-07-02 DIAGNOSIS — H9190 Unspecified hearing loss, unspecified ear: Secondary | ICD-10-CM | POA: Diagnosis present

## 2021-07-02 DIAGNOSIS — Z79899 Other long term (current) drug therapy: Secondary | ICD-10-CM | POA: Diagnosis not present

## 2021-07-02 DIAGNOSIS — N401 Enlarged prostate with lower urinary tract symptoms: Secondary | ICD-10-CM | POA: Diagnosis not present

## 2021-07-02 DIAGNOSIS — K625 Hemorrhage of anus and rectum: Secondary | ICD-10-CM

## 2021-07-02 DIAGNOSIS — R195 Other fecal abnormalities: Secondary | ICD-10-CM | POA: Diagnosis not present

## 2021-07-02 DIAGNOSIS — Z882 Allergy status to sulfonamides status: Secondary | ICD-10-CM | POA: Diagnosis not present

## 2021-07-02 DIAGNOSIS — Z8673 Personal history of transient ischemic attack (TIA), and cerebral infarction without residual deficits: Secondary | ICD-10-CM | POA: Diagnosis not present

## 2021-07-02 DIAGNOSIS — K449 Diaphragmatic hernia without obstruction or gangrene: Secondary | ICD-10-CM | POA: Diagnosis present

## 2021-07-02 DIAGNOSIS — D369 Benign neoplasm, unspecified site: Secondary | ICD-10-CM | POA: Diagnosis not present

## 2021-07-02 DIAGNOSIS — D649 Anemia, unspecified: Secondary | ICD-10-CM | POA: Diagnosis not present

## 2021-07-02 DIAGNOSIS — E785 Hyperlipidemia, unspecified: Secondary | ICD-10-CM | POA: Diagnosis present

## 2021-07-02 DIAGNOSIS — K921 Melena: Secondary | ICD-10-CM | POA: Diagnosis present

## 2021-07-02 DIAGNOSIS — Z888 Allergy status to other drugs, medicaments and biological substances status: Secondary | ICD-10-CM | POA: Diagnosis not present

## 2021-07-02 DIAGNOSIS — Z833 Family history of diabetes mellitus: Secondary | ICD-10-CM | POA: Diagnosis not present

## 2021-07-02 DIAGNOSIS — K5520 Angiodysplasia of colon without hemorrhage: Secondary | ICD-10-CM | POA: Diagnosis not present

## 2021-07-02 DIAGNOSIS — Z8619 Personal history of other infectious and parasitic diseases: Secondary | ICD-10-CM | POA: Diagnosis not present

## 2021-07-02 LAB — CBC
HCT: 26.2 % — ABNORMAL LOW (ref 39.0–52.0)
HCT: 26.6 % — ABNORMAL LOW (ref 39.0–52.0)
Hemoglobin: 7.6 g/dL — ABNORMAL LOW (ref 13.0–17.0)
Hemoglobin: 7.9 g/dL — ABNORMAL LOW (ref 13.0–17.0)
MCH: 21.7 pg — ABNORMAL LOW (ref 26.0–34.0)
MCH: 22.9 pg — ABNORMAL LOW (ref 26.0–34.0)
MCHC: 29 g/dL — ABNORMAL LOW (ref 30.0–36.0)
MCHC: 29.7 g/dL — ABNORMAL LOW (ref 30.0–36.0)
MCV: 74.9 fL — ABNORMAL LOW (ref 80.0–100.0)
MCV: 77.1 fL — ABNORMAL LOW (ref 80.0–100.0)
Platelets: 370 10*3/uL (ref 150–400)
Platelets: 339 10*3/uL (ref 150–400)
RBC: 3.5 MIL/uL — ABNORMAL LOW (ref 4.22–5.81)
RBC: 3.45 MIL/uL — ABNORMAL LOW (ref 4.22–5.81)
RDW: 16.5 % — ABNORMAL HIGH (ref 11.5–15.5)
RDW: 18 % — ABNORMAL HIGH (ref 11.5–15.5)
WBC: 9.3 10*3/uL (ref 4.0–10.5)
WBC: 8.1 10*3/uL (ref 4.0–10.5)
nRBC: 0 % (ref 0.0–0.2)
nRBC: 0 % (ref 0.0–0.2)

## 2021-07-02 LAB — HEMOGLOBIN AND HEMATOCRIT, BLOOD
HCT: 26.2 % — ABNORMAL LOW (ref 39.0–52.0)
Hemoglobin: 7.7 g/dL — ABNORMAL LOW (ref 13.0–17.0)

## 2021-07-02 LAB — COMPREHENSIVE METABOLIC PANEL
ALT: 15 U/L (ref 0–44)
AST: 17 U/L (ref 15–41)
Albumin: 3.5 g/dL (ref 3.5–5.0)
Alkaline Phosphatase: 62 U/L (ref 38–126)
Anion gap: 6 (ref 5–15)
BUN: 14 mg/dL (ref 8–23)
CO2: 23 mmol/L (ref 22–32)
Calcium: 8.7 mg/dL — ABNORMAL LOW (ref 8.9–10.3)
Chloride: 110 mmol/L (ref 98–111)
Creatinine, Ser: 1.12 mg/dL (ref 0.61–1.24)
GFR, Estimated: >60 mL/min (ref >60)
Glucose, Bld: 105 mg/dL — ABNORMAL HIGH (ref 70–99)
Potassium: 3.4 mmol/L — ABNORMAL LOW (ref 3.5–5.1)
Sodium: 139 mmol/L (ref 135–145)
Total Bilirubin: 0.6 mg/dL (ref 0.3–1.2)
Total Protein: 6.6 g/dL (ref 6.5–8.1)

## 2021-07-02 LAB — PREPARE RBC (CROSSMATCH)

## 2021-07-02 MED ORDER — POTASSIUM CHLORIDE CRYS ER 20 MEQ PO TBCR
40.0000 meq | EXTENDED_RELEASE_TABLET | Freq: Once | ORAL | Status: AC
  Administered 2021-07-02: 40 meq via ORAL
  Filled 2021-07-02: qty 2

## 2021-07-02 MED ORDER — SODIUM CHLORIDE 0.9% IV SOLUTION: Freq: Once | INTRAVENOUS | Status: AC

## 2021-07-02 NOTE — Progress Notes (Signed)
[facility] ? ?PROGRESS NOTE ? ?Matthew Knight:[id number] DOB: 07-02-1940 DOA: 07/01/2021 ?PCP: [name], MD ? ? ?Brief HPI:   ?81 year old male with a history of dementia, TIA, hypertension, hyperlipidemia presented with rectal bleeding.  Patient has assistance aids from the [place], they came by to check on him this morning and while they were helping him change close that noticed that he had bloody stool and blood in the toilet.  They became concerned and called EMS.  Patient has had his DAPT held in January of this year due to GI bleed.  He was instructed to resume aspirin only about 3 weeks ago. ? ? ? ?Subjective  ? ?Patient seen and examined, no further bleeding episodes in the hospital. ? ? Assessment/Plan:  ? ? 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 ?Anemia ?-Secondary to rectal bleeding as above ?-Hemoglobin dropped to 7.6 this morning ?-1 unit PRBC ordered by GI ?-Follow CBC in a.m. ? ?Hyperlipidemia ?-Continue pravastatin ? ?Hypertension ?-Blood pressure is stable ?-Continue lisinopril ? ?History of TIA ?-Patient was previously on aspirin and Plavix but then Plavix was stopped and restarted on aspirin due to recent GI bleed ?-Currently aspirin on hold ? ? ? ? ? ?Medications ? ?  ? escitalopram  20 mg Oral Daily  ? folic acid  0.5 mg Oral Daily  ? isosorbide mononitrate  30 mg Oral Daily  ? latanoprost  1 drop Both Eyes QHS  ? lisinopril  10 mg Oral Daily  ? Netarsudil Dimesylate  1 drop Both Eyes Daily  ? pantoprazole (PROTONIX) IV  40 mg Intravenous Q24H  ? pravastatin  20 mg Oral Daily  ? ? ? Data Reviewed:  ? ?CBG: ? ?No results for input(s): GLUCAP in the last 168 hours. ? ?SpO2: 96 %  ? ? ?Vitals:  ? 07/01/21 1922 07/01/21 2300 07/02/21 1216  07/02/21 1231  ?BP: (!) 123/56 115/60 (!) 145/66 117/64  ?Pulse: 89 86 68 69  ?Resp:  '20 18 16  '$ ?Temp:  98.3 ?F (36.8 ?C) 97.9 ?F (36.6 ?C) 98.2 ?F (36.8 ?C)  ?TempSrc:  Oral Oral Oral  ?SpO2:  96%  96%  ?Height:      ? ? ? ? ?Data Reviewed: ? ?Basic Metabolic Panel: ?Recent Labs  ?Lab 07/01/21 ?1159 07/02/21 ?0007  ?NA 141 139  ?K 3.8 3.4*  ?CL 111 110  ?CO2 23 23  ?GLUCOSE 109* 105*  ?BUN 14 14  ?CREATININE 0.99 1.12  ?CALCIUM 8.9 8.7*  ? ? ?CBC: ?Recent Labs  ?Lab 07/01/21 ?1159 07/01/21 ?1758 07/02/21 ?0007  ?WBC 9.7  --  9.3  ?HGB 8.7* 8.4* 7.6*  7.7*  ?HCT 28.9* 28.4* 26.2*  26.2*  ?MCV 73.5*  --  74.9*  ?PLT 392  --  370  ? ? ?LFT ?Recent Labs  ?Lab 07/01/21 ?1159 07/02/21 ?0007  ?AST 18 17  ?ALT 16 15  ?ALKPHOS 66 62  ?BILITOT 0.6 0.6  ?PROT 6.9 6.6  ?ALBUMIN 3.7 3.5  ? ?  ?Antibiotics: ?Anti-infectives (From admission, onward)  ? ? None  ? ?  ? ? ? ?DVT prophylaxis: SCDs ? ?Code Status: Full code ? ?Family Communication: No family at bedside ? ? ?CONSULTS  ? ? ?Objective  ? ? ?Physical Examination: ? ? ?General-appears in no acute  distress ?Heart-S1-S2, regular, no murmur auscultated ?Lungs-clear to auscultation bilaterally, no wheezing or crackles auscultated ?Abdomen-soft, nontender, no organomegaly ?Extremities-no edema in the lower extremities ?Neuro-alert, oriented x3, no focal deficit noted ? ?Status is: Inpatient: Rectal bleeding ? ? ? ?  ? ? ? ? ? ?Matthew Knight ?  ?[facility] ?If 7PM-7AM, please contact night-coverage at [email or web address], ?Office  [phone] ? ? ?07/02/2021, 2:20 PM  LOS: 0 days  ? ? ? ? ? ? ? ? ? ? ?  ?

## 2021-07-02 NOTE — TOC Initial Note (Signed)
Transition of Care (TOC) - Initial/Assessment Note  ? ? ?Patient Details  ?Name: Matthew Knight ?MRN: [id number] ?Date of Birth: 02-17-1941 ? ?Transition of Care (TOC) CM/SW Contact:    ?Tawanna Cooler, RN ?Phone Number: ?07/02/2021, 11:31 AM ? ?Clinical Narrative:                 ? ?Spoke with patient's daughter and POA, Overton Boggus, via phone.  She confirms that patient lives at [facility] independent living.  States he moved there about a month ago.  He has home health aides through the [place].  The aides go to his apt and help him throughout the day, seems they visit in the mornings, around lunch, and again in the evening.  They make sure he takes him medicine and also make sure he eats.  Someone also comes in twice a week to help him bathe.   ?TOC will continue to follow for discharge needs.  ? ? ?Expected Discharge Plan: Home/Self Care ?Barriers to Discharge: Continued Medical Work up ? ? ?Patient Goals and CMS Choice ?Patient states their goals for this hospitalization and ongoing recovery are:: return home ?  ?  ? ?Expected Discharge Plan and Services ?Expected Discharge Plan: Home/Self Care ?  ?  ?  ?Living arrangements for the past 2 months: [place] ([place]) ?                ?  ?Prior Living Arrangements/Services ?Living arrangements for the past 2 months: [place] ([place]) ?  ?Patient language and need for interpreter reviewed:: Yes ?Do you feel safe going back to the place where you live?: Yes      ?Need for Family Participation in Patient Care: Yes (Comment) ?Care giver support system in place?: Yes (comment) ?Current home services: Homehealth aide ?Criminal Activity/Legal Involvement Pertinent to Current Situation/Hospitalization: No - Comment as needed ? ?Activities of Daily Living ?Home Assistive Devices/Equipment: Hearing aid (bilateral hearing aides) ?ADL Screening (condition at time of admission) ?Patient's cognitive ability  adequate to safely complete daily activities?: No ?Is the patient deaf or have difficulty hearing?: Yes ?Does the patient have difficulty seeing, even when wearing glasses/contacts?: No ?Does the patient have difficulty concentrating, remembering, or making decisions?: Yes (hx dementia per chart) ?Patient able to express need for assistance with ADLs?: Yes ?Does the patient have difficulty dressing or bathing?: Yes ?Independently performs ADLs?: No ?Communication: Independent ?Dressing (OT): Needs assistance ?Is this a change from baseline?: Pre-admission baseline ?Grooming: Independent ?Feeding: Independent ?Bathing: Needs assistance ?Is this a change from baseline?: Pre-admission baseline ?Toileting: Needs assistance ?Is this a change from baseline?: Pre-admission baseline ?In/Out Bed: Needs assistance ?Is this a change from baseline?: Pre-admission baseline ?Walks in Home: Needs assistance ?Is this a change from baseline?: Pre-admission baseline ?Does the patient have difficulty walking or climbing stairs?: Yes ?Weakness of Legs: Both ?Weakness of Arms/Hands: Both ? ?Emotional Assessment ?  ?Alcohol / Substance Use: Not Applicable ?Psych Involvement: No (comment) ? ?Admission diagnosis:  GIB (gastrointestinal bleeding) [K92.2] ?Acute GI bleeding [K92.2] ?Patient Active Problem List  ? Diagnosis Date Noted  ? GIB (gastrointestinal bleeding) 07/01/2021  ? BPH (benign prostatic hyperplasia) 07/01/2021  ? HLD (hyperlipidemia) 07/01/2021  ? Iron deficiency anemia due to chronic blood loss   ? Symptomatic anemia   ? GI bleed 03/19/2021  ? Leukocytosis 03/19/2021  ? Renal insufficiency 03/19/2021  ? Fall 03/19/2021  ? Heme positive stool   ? Acute blood loss anemia   ?  Platelet inhibition due to Plavix   ? Orthostatic hypotension 03/10/2021  ? Acute left-sided weakness 03/09/2021  ? Closed fracture distal radius and ulna, left, sequela 03/09/2021  ? TIA (transient ischemic attack) 03/09/2021  ? Abnormal ECG 04/28/2020   ? Snoring 04/28/2020  ? Anxiety 11/22/2018  ? Dementia without behavioral disturbance ([place]) 11/22/2018  ? Depression 11/22/2018  ? Exposure to Agent [facility] 11/22/2018  ? Small vessel disease, 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  ? Dizziness 10/24/2018  ? Benign essential HTN 11/11/2017  ? Bradycardia 11/11/2017  ? Near syncope 11/10/2017  ? Onychomycosis 04/19/2017  ? Memory loss 01/22/2017  ? 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  ? 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 Hypertension, benign 03/28/2015  ? Screening for colon cancer 12/03/2013  ? 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 Tobacco use disorder 07/17/2012  ? Vitamin D deficiency 01/14/2012  ? Sudden visual loss 08/17/2010  ? Elevated prostate specific antigen (PSA) 01/07/2010  ? Chest pain 02/10/2009  ? Skin sensation disturbance 02/10/2009  ? Hepatitis 01/23/2009  ? ?PCP:  [name], MD ?Pharmacy:   ?[facility] - [address] ?[place] ?[place] [address] ?Phone: [phone] Fax: [phone] ? ?

## 2021-07-02 NOTE — Progress Notes (Signed)
[facility] Progress Note ? ?Matthew Knight 81 y.o. Nov 10, 1940 ? ?CC:  GI bleed ? ? ?Subjective: ?Seen laying in bed comfortably.  Unable to obtain ROS and history at this time due to dementia.  Patient is in good spirits. ? ?ROS : Review of Systems  ?Unable to perform ROS: Dementia   ? ? ?Objective: ?Vital signs in last 24 hours: ?Vitals:  ? 07/01/21 1922 07/01/21 2300  ?BP: (!) 123/56 115/60  ?Pulse: 89 86  ?Resp:  20  ?Temp:  98.3 ?F (36.8 ?C)  ?SpO2:  96%  ? ? ?Physical Exam: ? ?General:  Alert, cooperative, no distress, appears stated age  ?Head:  Normocephalic, without obvious abnormality, atraumatic  ?Eyes:  Anicteric sclera, EOM's intact  ?Lungs:   Clear to auscultation bilaterally, respirations unlabored  ?Heart:  Regular rate and rhythm, S1, S2 normal  ?Abdomen:   Soft, non-tender, bowel sounds active all four quadrants,  no masses,   ?Extremities: Extremities normal, atraumatic, no  edema  ?Pulses: 2+ and symmetric  ? ? ?Lab Results: ?Recent Labs  ?  07/01/21 ?1159 07/02/21 ?0007  ?NA 141 139  ?K 3.8 3.4*  ?CL 111 110  ?CO2 23 23  ?GLUCOSE 109* 105*  ?BUN 14 14  ?CREATININE 0.99 1.12  ?CALCIUM 8.9 8.7*  ? ?Recent Labs  ?  07/01/21 ?1159 07/02/21 ?0007  ?AST 18 17  ?ALT 16 15  ?ALKPHOS 66 62  ?BILITOT 0.6 0.6  ?PROT 6.9 6.6  ?ALBUMIN 3.7 3.5  ? ?Recent Labs  ?  07/01/21 ?1159 07/01/21 ?1758 07/02/21 ?0007  ?WBC 9.7  --  9.3  ?HGB 8.7* 8.4* 7.6*  7.7*  ?HCT 28.9* 28.4* 26.2*  26.2*  ?MCV 73.5*  --  74.9*  ?PLT 392  --  370  ? ?Recent Labs  ?  07/01/21 ?1159  ?LABPROT 13.9  ?INR 1.1  ? ? ? ? ?Assessment ?GI bleed ?Anemia ?  ?03/24/21 Colonoscopy: Ablation of 7 AVMs.  Multiple colon polyps, 4 large, largest 25 mm, were hot snared/resected/retrieved and polypectomy sites clipped.  Smaller 3 to 9 mm polyps throughout colon were not removed.  Nonbleeding int/ext rrhoids.   ?  ?03/24/21 EGD: Small HH.  Gastritis.  Duodenal erythema.  No active bleeding or stigmata of recent bleeding.  No specimens  collected.  Protonix 40 mg daily or equivalent recommended. ?  ?Previous multiple colonic AVMs, multiple polypectomies, previously seen hemorrhoids.  More likely lower GI bleed.  Previous EGD with no signs of bleeding, no reported melena, no elevation of BUN to creatinine ratio upper GI bleed less likely. ?  ?07/01/2021 hemoglobin 8.7, baseline around 10.  Today hemoglobin is 7.6, will continue to monitor. ? ? ?Plan: ?Continue supportive care ?Continue to monitor hemoglobin we will transfuse if less than 7 ?Possible lower GI bleed,will evaluate for further bleeding during admission if brisk bleeding may consider GI bleeding scan.  If bleeding does not resolve hemoglobin drops may consider repeat colonoscopy Friday. ?Continue IV Protonix 40 mg ?Start clear liquid diet ?Repeat CBC, CMP tomorrow. ?[facility] will follow ? ?Charlott Rakes PA-C ?07/02/2021, 10:14 AM ? ?Contact #  [phone]  ?

## 2021-07-03 DIAGNOSIS — K922 Gastrointestinal hemorrhage, unspecified: Secondary | ICD-10-CM | POA: Diagnosis not present

## 2021-07-03 DIAGNOSIS — K625 Hemorrhage of anus and rectum: Secondary | ICD-10-CM | POA: Diagnosis not present

## 2021-07-03 DIAGNOSIS — I10 Essential (primary) hypertension: Secondary | ICD-10-CM | POA: Diagnosis not present

## 2021-07-03 LAB — URINALYSIS, ROUTINE W REFLEX MICROSCOPIC
Bilirubin Urine: NEGATIVE
Glucose, UA: NEGATIVE mg/dL
Hgb urine dipstick: NEGATIVE
Ketones, ur: NEGATIVE mg/dL
Leukocytes,Ua: NEGATIVE
Nitrite: NEGATIVE
Protein, ur: NEGATIVE mg/dL
Specific Gravity, Urine: 1.009 (ref 1.005–1.030)
pH: 5 (ref 5.0–8.0)

## 2021-07-03 LAB — CBC WITH DIFFERENTIAL/PLATELET
Abs Immature Granulocytes: UNDETERMINED 10*3/uL (ref 0.00–0.07)
Band Neutrophils: UNDETERMINED %
Basophils Absolute: UNDETERMINED 10*3/uL (ref 0.0–0.1)
Basophils Relative: UNDETERMINED %
Blasts: UNDETERMINED %
Eosinophils Absolute: UNDETERMINED 10*3/uL (ref 0.0–0.5)
Eosinophils Relative: UNDETERMINED %
HCT: 31.3 % — ABNORMAL LOW (ref 39.0–52.0)
Hemoglobin: 9.3 g/dL — ABNORMAL LOW (ref 13.0–17.0)
Immature Granulocytes: UNDETERMINED %
Lymphocytes Relative: UNDETERMINED %
Lymphs Abs: UNDETERMINED 10*3/uL (ref 0.7–4.0)
MCH: 22.5 pg — ABNORMAL LOW (ref 26.0–34.0)
MCHC: 29.7 g/dL — ABNORMAL LOW (ref 30.0–36.0)
MCV: 75.8 fL — ABNORMAL LOW (ref 80.0–100.0)
Metamyelocytes Relative: UNDETERMINED %
Monocytes Absolute: UNDETERMINED 10*3/uL (ref 0.1–1.0)
Monocytes Relative: UNDETERMINED %
Myelocytes: UNDETERMINED %
Neutro Abs: UNDETERMINED 10*3/uL (ref 1.7–7.7)
Neutrophils Relative %: UNDETERMINED %
Other: UNDETERMINED %
Platelets: 403 10*3/uL — ABNORMAL HIGH (ref 150–400)
Promyelocytes Relative: UNDETERMINED %
RBC Morphology: UNDETERMINED
RBC: 4.13 MIL/uL — ABNORMAL LOW (ref 4.22–5.81)
RDW: 17.7 % — ABNORMAL HIGH (ref 11.5–15.5)
Smear Review: UNDETERMINED
WBC Morphology: UNDETERMINED
WBC: 8.8 10*3/uL (ref 4.0–10.5)
nRBC: 0 % (ref 0.0–0.2)
nRBC: UNDETERMINED /100 WBC

## 2021-07-03 LAB — BPAM RBC
Blood Product Expiration Date: 202305252359
ISSUE DATE / TIME: 202304271209
Unit Type and Rh: 5100

## 2021-07-03 LAB — BASIC METABOLIC PANEL
Anion gap: 6 (ref 5–15)
BUN: 10 mg/dL (ref 8–23)
CO2: 24 mmol/L (ref 22–32)
Calcium: 8.8 mg/dL — ABNORMAL LOW (ref 8.9–10.3)
Chloride: 111 mmol/L (ref 98–111)
Creatinine, Ser: 1.08 mg/dL (ref 0.61–1.24)
GFR, Estimated: >60 mL/min (ref >60)
Glucose, Bld: 91 mg/dL (ref 70–99)
Potassium: 3.7 mmol/L (ref 3.5–5.1)
Sodium: 141 mmol/L (ref 135–145)

## 2021-07-03 LAB — TYPE AND SCREEN
ABO/RH(D): O POS
Antibody Screen: NEGATIVE
Unit division: 0

## 2021-07-03 MED ORDER — TAMSULOSIN HCL 0.4 MG PO CAPS
0.4000 mg | ORAL_CAPSULE | Freq: Every day | ORAL | Status: DC
  Administered 2021-07-03 – 2021-07-04 (×2): 0.4 mg via ORAL
  Filled 2021-07-03 (×2): qty 1

## 2021-07-03 NOTE — Progress Notes (Signed)
[facility] ? ?PROGRESS NOTE ? ?Matthew Knight FBP:[id number] DOB: 08-22-1940 DOA: 07/01/2021 ?PCP: Matthew Limbo, MD ? ? ?Brief HPI:   ?81 year old male with a history of dementia, TIA, hypertension, hyperlipidemia presented with rectal bleeding.  Patient has assistance aids from the [place], they came by to check on him this morning and while they were helping him change close that noticed that he had bloody stool and blood in the toilet.  They became concerned and called EMS.  Patient has had his DAPT held in January of this year due to GI bleed.  He was instructed to resume aspirin only about 3 weeks ago. ? ? ? ?Subjective  ? ?Patient seen and examined, denies any complaints.  Hemoglobin is up to 9.3 today. ? ? Assessment/Plan:  ? ? 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 ?Anemia ?-Secondary to rectal bleeding as above ?-Hemoglobin dropped to 7.6 yesterday ?-S/p 1 unit PRBC ?-Hemoglobin this morning is 9.3 ? ? ?Hyperlipidemia ?-Continue pravastatin ? ?Hypertension ?-Blood pressure is stable ?-Continue lisinopril ? ?History of TIA ?-Patient was previously on aspirin and Plavix but then Plavix was stopped and restarted on aspirin due to recent GI bleed ?-Currently aspirin on hold ? ? ? ? ? ?Medications ? ?  ? escitalopram  20 mg Oral Daily  ? folic acid  0.5 mg Oral Daily  ? isosorbide mononitrate  30 mg Oral Daily  ? latanoprost  1 drop Both Eyes QHS  ? lisinopril  10 mg Oral Daily  ? Netarsudil Dimesylate  1 drop Both Eyes Daily  ? pantoprazole (PROTONIX) IV  40 mg Intravenous Q24H  ? pravastatin  20 mg Oral Daily  ? ? ? Data Reviewed:  ? ?CBG: ? ?No results for input(s): GLUCAP in the last 168 hours. ? ?SpO2: 100 %  ? ? ?Vitals:  ? 07/02/21 1545 07/02/21 1948  07/02/21 2215 07/03/21 8242  ?BP: (!) 135/52 119/60  (!) 155/58  ?Pulse: 61 63  66  ?Resp: '18 18  20  '$ ?Temp: 98.1 ?F (36.7 ?C) 97.6 ?F (36.4 ?C)  97.6 ?F (36.4 ?C)  ?TempSrc: Oral Oral  Oral  ?SpO2:  97%  100%  ?Weight:   96.6 kg   ?Height:   6' (1.829 m)   ? ? ? ? ?Data Reviewed: ? ?Basic Metabolic Panel: ?Recent Labs  ?Lab 07/01/21 ?1159 07/02/21 ?0007 07/03/21 ?3536  ?NA 141 139 141  ?K 3.8 3.4* 3.7  ?CL 111 110 111  ?CO2 '23 23 24  '$ ?GLUCOSE 109* 105* 91  ?BUN '14 14 10  '$ ?CREATININE 0.99 1.12 1.08  ?CALCIUM 8.9 8.7* 8.8*  ? ? ?CBC: ?Recent Labs  ?Lab 07/01/21 ?1159 07/01/21 ?1758 07/02/21 ?0007 07/02/21 ?1646 07/03/21 ?0741  ?WBC 9.7  --  9.3 8.1 8.8  ?NEUTROABS  --   --   --   --  PENDING  ?HGB 8.7* 8.4* 7.6*  7.7* 7.9* 9.3*  ?HCT 28.9* 28.4* 26.2*  26.2* 26.6* 31.3*  ?MCV 73.5*  --  74.9* 77.1* 75.8*  ?PLT 392  --  370 339 403*  ? ? ?LFT ?Recent Labs  ?Lab 07/01/21 ?1159 07/02/21 ?0007  ?AST 18 17  ?ALT 16 15  ?ALKPHOS 66 62  ?BILITOT 0.6 0.6  ?PROT 6.9 6.6  ?ALBUMIN 3.7 3.5  ? ?  ?  Antibiotics: ?Anti-infectives (From admission, onward)  ? ? None  ? ?  ? ? ? ?DVT prophylaxis: SCDs ? ?Code Status: Full code ? ?Family Communication: No family at bedside ? ? ?CONSULTS  ? ? ?Objective  ? ? ?Physical Examination: ? ? ?General-appears in no acute distress ?Heart-S1-S2, regular, no murmur auscultated ?Lungs-clear to auscultation bilaterally, no wheezing or crackles auscultated ?Abdomen-soft, nontender, no organomegaly ?Extremities-no edema in the lower extremities ?Neuro-alert, oriented x3, no focal deficit noted ? ?Status is: Inpatient: Rectal bleeding ? ? ? ?  ? ? ? ? ? ?Matthew Knight ?  ?[facility] ?If 7PM-7AM, please contact night-coverage at [email or web address], ?Office  [phone] ? ? ?07/03/2021, 9:20 AM  LOS: 1 day  ? ? ? ? ? ? ? ? ? ? ?  ?

## 2021-07-03 NOTE — Progress Notes (Signed)
[facility] Progress Note ? ?Rayshad Jenetta Downer Randon 81 y.o. [birth year] ? ?CC:  GI bleed ? ? ?Subjective: ?Seen laying in bed comfortably.  Unable to obtain ROS and history at this time due to dementia.  Patient is in good spirits. He remains confused. ? ?ROS : Review of Systems  ?Unable to perform ROS: Dementia  ? ? ? ?Objective: ?Vital signs in last 24 hours: ?Vitals:  ? 07/02/21 1948 07/03/21 0634  ?BP: 119/60 (!) 155/58  ?Pulse: 63 66  ?Resp: 18 20  ?Temp: 97.6 ?F (36.4 ?C) 97.6 ?F (36.4 ?C)  ?SpO2: 97% 100%  ? ? ?Physical Exam: ? ?General:  Alert, cooperative, no distress, appears stated age  ?Head:  Normocephalic, without obvious abnormality, atraumatic  ?Eyes:  Anicteric sclera, EOM's intact  ?Lungs:   Clear to auscultation bilaterally, respirations unlabored  ?Heart:  Regular rate and rhythm, S1, S2 normal  ?Abdomen:   Soft, non-tender, bowel sounds active all four quadrants,  no masses,   ?Extremities: Extremities normal, atraumatic, no  edema  ?Pulses: 2+ and symmetric  ? ? ?Lab Results: ?Recent Labs  ?  07/02/21 ?0007 07/03/21 ?8250  ?NA 139 141  ?K 3.4* 3.7  ?CL 110 111  ?CO2 23 24  ?GLUCOSE 105* 91  ?BUN 14 10  ?CREATININE 1.12 1.08  ?CALCIUM 8.7* 8.8*  ? ?Recent Labs  ?  07/01/21 ?1159 07/02/21 ?0007  ?AST 18 17  ?ALT 16 15  ?ALKPHOS 66 62  ?BILITOT 0.6 0.6  ?PROT 6.9 6.6  ?ALBUMIN 3.7 3.5  ? ?Recent Labs  ?  07/02/21 ?1646 07/03/21 ?0741  ?WBC 8.1 8.8  ?NEUTROABS  --  PENDING  ?HGB 7.9* 9.3*  ?HCT 26.6* 31.3*  ?MCV 77.1* 75.8*  ?PLT 339 403*  ? ?Recent Labs  ?  07/01/21 ?1159  ?LABPROT 13.9  ?INR 1.1  ? ? ? ? ?Assessment ?GI bleed ?Anemia ?  ?03/24/21 Colonoscopy: Ablation of 7 AVMs.  Multiple colon polyps, 4 large, largest 25 mm, were hot snared/resected/retrieved and polypectomy sites clipped.  Smaller 3 to 9 mm polyps throughout colon were not removed.  Nonbleeding int/ext rrhoids.   ?  ?03/24/21 EGD: Small HH.  Gastritis.  Duodenal erythema.  No active bleeding or stigmata of recent bleeding.   No specimens collected.  Protonix 40 mg daily or equivalent recommended. ?  ?Previous multiple colonic AVMs, multiple polypectomies, previously seen hemorrhoids.  More likely lower GI bleed.  Previous EGD with no signs of bleeding, no reported melena, no elevation of BUN to creatinine ratio upper GI bleed less likely. More likely lower GI bleed.   ?  ?07/01/2021 hemoglobin 8.7, baseline around 10.  Today hemoglobin is 9.3 after 1 unit pRBC, will continue to monitor. ? ? ?Plan: ?Continue supportive care ?Continue to monitor hemoglobin, transfuse if less than 7 ?Continue IV Protonix 40 mg ?Continue full liquid diet ?[facility] will sign off. ? ?Charlott Rakes PA-C ?07/03/2021, 11:17 AM ? ?Contact #  [phone]  ?

## 2021-07-03 NOTE — Plan of Care (Signed)

## 2021-07-04 ENCOUNTER — Inpatient Hospital Stay (HOSPITAL_COMMUNITY)
Admission: EM | Admit: 2021-07-04 | Discharge: 2021-07-11 | DRG: 378 | Disposition: A | Discharge status: Home Health | Payer: Medicare Other | Source: Skilled Nursing Facility | Attending: Internal Medicine | Admitting: Internal Medicine

## 2021-07-04 ENCOUNTER — Other Ambulatory Visit: Payer: Self-pay

## 2021-07-04 ENCOUNTER — Encounter (HOSPITAL_COMMUNITY): Payer: Self-pay | Admitting: Emergency Medicine

## 2021-07-04 ENCOUNTER — Emergency Department (HOSPITAL_COMMUNITY): Payer: Medicare Other

## 2021-07-04 DIAGNOSIS — F0393 Unspecified dementia, unspecified severity, with mood disturbance: Secondary | ICD-10-CM | POA: Diagnosis present

## 2021-07-04 DIAGNOSIS — I951 Orthostatic hypotension: Secondary | ICD-10-CM | POA: Diagnosis present

## 2021-07-04 DIAGNOSIS — K5521 Angiodysplasia of colon with hemorrhage: Principal | ICD-10-CM | POA: Diagnosis present

## 2021-07-04 DIAGNOSIS — K635 Polyp of colon: Secondary | ICD-10-CM | POA: Diagnosis present

## 2021-07-04 DIAGNOSIS — Z8673 Personal history of transient ischemic attack (TIA), and cerebral infarction without residual deficits: Secondary | ICD-10-CM

## 2021-07-04 DIAGNOSIS — K5520 Angiodysplasia of colon without hemorrhage: Secondary | ICD-10-CM | POA: Diagnosis present

## 2021-07-04 DIAGNOSIS — N400 Enlarged prostate without lower urinary tract symptoms: Secondary | ICD-10-CM | POA: Diagnosis present

## 2021-07-04 DIAGNOSIS — F0394 Unspecified dementia, unspecified severity, with anxiety: Secondary | ICD-10-CM | POA: Diagnosis present

## 2021-07-04 DIAGNOSIS — Z888 Allergy status to other drugs, medicaments and biological substances status: Secondary | ICD-10-CM

## 2021-07-04 DIAGNOSIS — W19XXXA Unspecified fall, initial encounter: Secondary | ICD-10-CM

## 2021-07-04 DIAGNOSIS — Z882 Allergy status to sulfonamides status: Secondary | ICD-10-CM

## 2021-07-04 DIAGNOSIS — K640 First degree hemorrhoids: Secondary | ICD-10-CM | POA: Diagnosis present

## 2021-07-04 DIAGNOSIS — E876 Hypokalemia: Secondary | ICD-10-CM | POA: Diagnosis not present

## 2021-07-04 DIAGNOSIS — F0390 Unspecified dementia without behavioral disturbance: Secondary | ICD-10-CM

## 2021-07-04 DIAGNOSIS — D72829 Elevated white blood cell count, unspecified: Secondary | ICD-10-CM | POA: Diagnosis not present

## 2021-07-04 DIAGNOSIS — E119 Type 2 diabetes mellitus without complications: Secondary | ICD-10-CM | POA: Diagnosis present

## 2021-07-04 DIAGNOSIS — Z8249 Family history of ischemic heart disease and other diseases of the circulatory system: Secondary | ICD-10-CM

## 2021-07-04 DIAGNOSIS — R195 Other fecal abnormalities: Secondary | ICD-10-CM | POA: Diagnosis present

## 2021-07-04 DIAGNOSIS — D62 Acute posthemorrhagic anemia: Secondary | ICD-10-CM | POA: Diagnosis present

## 2021-07-04 DIAGNOSIS — Z6828 Body mass index (BMI) 28.0-28.9, adult: Secondary | ICD-10-CM

## 2021-07-04 DIAGNOSIS — K625 Hemorrhage of anus and rectum: Secondary | ICD-10-CM | POA: Diagnosis present

## 2021-07-04 DIAGNOSIS — R55 Syncope and collapse: Secondary | ICD-10-CM | POA: Diagnosis not present

## 2021-07-04 DIAGNOSIS — R3911 Hesitancy of micturition: Secondary | ICD-10-CM

## 2021-07-04 DIAGNOSIS — Y92009 Unspecified place in unspecified non-institutional (private) residence as the place of occurrence of the external cause: Secondary | ICD-10-CM

## 2021-07-04 DIAGNOSIS — E785 Hyperlipidemia, unspecified: Secondary | ICD-10-CM | POA: Diagnosis present

## 2021-07-04 DIAGNOSIS — E782 Mixed hyperlipidemia: Secondary | ICD-10-CM

## 2021-07-04 DIAGNOSIS — I10 Essential (primary) hypertension: Secondary | ICD-10-CM | POA: Diagnosis present

## 2021-07-04 DIAGNOSIS — H9190 Unspecified hearing loss, unspecified ear: Secondary | ICD-10-CM | POA: Diagnosis present

## 2021-07-04 DIAGNOSIS — D649 Anemia, unspecified: Principal | ICD-10-CM | POA: Diagnosis present

## 2021-07-04 DIAGNOSIS — I5042 Chronic combined systolic (congestive) and diastolic (congestive) heart failure: Secondary | ICD-10-CM | POA: Diagnosis present

## 2021-07-04 DIAGNOSIS — N401 Enlarged prostate with lower urinary tract symptoms: Secondary | ICD-10-CM | POA: Diagnosis not present

## 2021-07-04 DIAGNOSIS — I110 Hypertensive heart disease with heart failure: Secondary | ICD-10-CM | POA: Diagnosis present

## 2021-07-04 DIAGNOSIS — F17290 Nicotine dependence, other tobacco product, uncomplicated: Secondary | ICD-10-CM | POA: Diagnosis present

## 2021-07-04 DIAGNOSIS — H409 Unspecified glaucoma: Secondary | ICD-10-CM | POA: Diagnosis present

## 2021-07-04 DIAGNOSIS — D75839 Thrombocytosis, unspecified: Secondary | ICD-10-CM | POA: Diagnosis not present

## 2021-07-04 DIAGNOSIS — Z833 Family history of diabetes mellitus: Secondary | ICD-10-CM

## 2021-07-04 DIAGNOSIS — E663 Overweight: Secondary | ICD-10-CM | POA: Diagnosis present

## 2021-07-04 DIAGNOSIS — K219 Gastro-esophageal reflux disease without esophagitis: Secondary | ICD-10-CM | POA: Diagnosis present

## 2021-07-04 DIAGNOSIS — Z66 Do not resuscitate: Secondary | ICD-10-CM | POA: Diagnosis present

## 2021-07-04 DIAGNOSIS — K922 Gastrointestinal hemorrhage, unspecified: Secondary | ICD-10-CM | POA: Diagnosis not present

## 2021-07-04 DIAGNOSIS — Z79899 Other long term (current) drug therapy: Secondary | ICD-10-CM

## 2021-07-04 LAB — CBC
HCT: 29.9 % — ABNORMAL LOW (ref 39.0–52.0)
HCT: 27.4 % — ABNORMAL LOW (ref 39.0–52.0)
Hemoglobin: 9.2 g/dL — ABNORMAL LOW (ref 13.0–17.0)
Hemoglobin: 8.4 g/dL — ABNORMAL LOW (ref 13.0–17.0)
MCH: 23.1 pg — ABNORMAL LOW (ref 26.0–34.0)
MCH: 22.8 pg — ABNORMAL LOW (ref 26.0–34.0)
MCHC: 30.8 g/dL (ref 30.0–36.0)
MCHC: 30.7 g/dL (ref 30.0–36.0)
MCV: 75.1 fL — ABNORMAL LOW (ref 80.0–100.0)
MCV: 74.5 fL — ABNORMAL LOW (ref 80.0–100.0)
Platelets: 430 10*3/uL — ABNORMAL HIGH (ref 150–400)
Platelets: 411 10*3/uL — ABNORMAL HIGH (ref 150–400)
RBC: 3.98 MIL/uL — ABNORMAL LOW (ref 4.22–5.81)
RBC: 3.68 MIL/uL — ABNORMAL LOW (ref 4.22–5.81)
RDW: 17.8 % — ABNORMAL HIGH (ref 11.5–15.5)
RDW: 17.7 % — ABNORMAL HIGH (ref 11.5–15.5)
WBC: 10.2 10*3/uL (ref 4.0–10.5)
WBC: 9.1 10*3/uL (ref 4.0–10.5)
nRBC: 0 % (ref 0.0–0.2)
nRBC: 0 % (ref 0.0–0.2)

## 2021-07-04 LAB — BASIC METABOLIC PANEL
Anion gap: 8 (ref 5–15)
BUN: 9 mg/dL (ref 8–23)
CO2: 27 mmol/L (ref 22–32)
Calcium: 9.1 mg/dL (ref 8.9–10.3)
Chloride: 105 mmol/L (ref 98–111)
Creatinine, Ser: 1.21 mg/dL (ref 0.61–1.24)
GFR, Estimated: >60 mL/min (ref >60)
Glucose, Bld: 122 mg/dL — ABNORMAL HIGH (ref 70–99)
Potassium: 3.6 mmol/L (ref 3.5–5.1)
Sodium: 140 mmol/L (ref 135–145)

## 2021-07-04 LAB — URINALYSIS, ROUTINE W REFLEX MICROSCOPIC
Bilirubin Urine: NEGATIVE
Glucose, UA: NEGATIVE mg/dL
Hgb urine dipstick: NEGATIVE
Ketones, ur: NEGATIVE mg/dL
Leukocytes,Ua: NEGATIVE
Nitrite: NEGATIVE
Protein, ur: NEGATIVE mg/dL
Specific Gravity, Urine: 1.015 (ref 1.005–1.030)
pH: 5 (ref 5.0–8.0)

## 2021-07-04 LAB — POC OCCULT BLOOD, ED: Fecal Occult Bld: POSITIVE — AB

## 2021-07-04 MED ORDER — SODIUM CHLORIDE 0.9% FLUSH
3.0000 mL | Freq: Once | INTRAVENOUS | Status: AC
  Administered 2021-07-04: 3 mL via INTRAVENOUS

## 2021-07-04 MED ORDER — POLYETHYLENE GLYCOL 3350 17 G PO PACK: 17.0000 g | PACK | Freq: Every day | ORAL | 0 refills | Status: AC | PRN

## 2021-07-04 MED ORDER — LACTATED RINGERS IV BOLUS
500.0000 mL | Freq: Once | INTRAVENOUS | Status: AC
Start: 2021-07-04 — End: 2021-07-05
  Administered 2021-07-04: 500 mL via INTRAVENOUS

## 2021-07-04 NOTE — Subjective & Objective (Signed)
Discharged from [facility] for symptomatic anemia lives in independent living  ?Had another fall he does not know ho he fell ?Had hx of Lower Gi bleed ?Was on Plavix for TIA was held ?Was only on Aspirin had another bleed ?Now not on aspirin now ? ? ?

## 2021-07-04 NOTE — Assessment & Plan Note (Signed)
Persistent.  Appreciate [facility] consult ?

## 2021-07-04 NOTE — ED Notes (Signed)
Pt has dementia and remains in triage. ?

## 2021-07-04 NOTE — ED Provider Notes (Addendum)
?[facility] ?Provider Note ? ? ?CSN: [id number] ?Arrival date & time: 07/04/21  1758 ? ?  ? ?History ? ?Chief Complaint  ?Patient presents with  ? Fall  ? ? ?Matthew Knight is a 81 y.o. male. ? ? ?Fall ? ?Patient is 81 year old male with a history of anemia secondary to lower GI bleed who presents to the emergency department after syncopal episode.  Per daughter patient was recently admitted to [facility] long for symptomatic anemia.  He received a unit of blood and after his reassuring labs he was discharged today.  He lives at an independent living facility.  He had unwitnessed fall.  Reported that he fell on a concrete.  Patient does not remember the incident.  He endorses loss of consciousness.  He reports he passed out.  Denies mechanical fall.  He is not sure if he is having GI bleed.  He has not had a bowel movement.  He currently denies chest pain or shortness of breath.  He denies head pain or neck pain.  Denies any extremity pain.  Otherwise no other complaints. ?  ? ?Home Medications ?Prior to Admission medications   ?Medication Sig Start Date End Date Taking? Authorizing Provider  ?acetaminophen (TYLENOL) 325 MG tablet Take 2 tablets (650 mg total) by mouth every 4 (four) hours as needed for mild pain, moderate pain, fever or headache. 03/11/21  Yes [name], MD  ?escitalopram (LEXAPRO) 20 MG tablet Take 20 mg by mouth daily. 07/11/17  Yes [provider]  ?folic acid (FOLVITE) 829 MCG tablet Take 400 mcg by mouth daily.   Yes [provider]  ?isosorbide mononitrate (IMDUR) 30 MG 24 hr tablet Take 30 mg by mouth daily. 04/28/20  Yes [provider]  ?latanoprost (XALATAN) 0.005 % ophthalmic solution Place 1 drop into both eyes at bedtime.  07/27/17  Yes [provider]  ?lisinopril (ZESTRIL) 10 MG tablet Take 10 mg by mouth daily. 02/01/20  Yes [provider]  ?meclizine (ANTIVERT) 25 MG tablet Take 25 mg by mouth  daily.   Yes [provider]  ?omeprazole (PRILOSEC) 40 MG capsule Take 1 capsule (40 mg total) by mouth daily. 03/25/21 07/04/21 Yes Pahwani, Einar Grad, MD  ?polyethylene glycol (MIRALAX) 17 g packet Take 17 g by mouth daily as needed for moderate constipation. 07/04/21  Yes [name], MD  ?pravastatin (PRAVACHOL) 20 MG tablet Take 20 mg by mouth daily. 04/28/20  Yes [provider]  ?RHOPRESSA 0.02 % SOLN Place 1 drop into both eyes daily. 11/15/18  Yes [provider]  ?silodosin (RAPAFLO) 8 MG CAPS capsule Take 8 mg by mouth daily. 06/13/21  Yes [provider]  ?   ? ?Allergies    ?Darunavir, Exelon [rivastigmine], and Sulfa antibiotics   ? ?Review of Systems   ?Review of Systems ? ?Physical Exam ?Updated Vital Signs ?BP (!) 108/56   Pulse 66   Temp 97.8 ?F (36.6 ?C) (Oral)   Resp 14   SpO2 97%  ?Physical Exam ?Vitals and nursing note reviewed.  ?Constitutional:   ?   General: He is not in acute distress. ?   Appearance: He is well-developed.  ?   Comments: Tired appearing  ?HENT:  ?   Head: Normocephalic and atraumatic.  ?Eyes:  ?   Conjunctiva/sclera: Conjunctivae normal.  ?Cardiovascular:  ?   Rate and Rhythm: Normal rate and regular rhythm.  ?   Heart sounds: No murmur heard. ?Pulmonary:  ?  Effort: Pulmonary effort is normal. No respiratory distress.  ?   Breath sounds: Normal breath sounds.  ?Abdominal:  ?   Palpations: Abdomen is soft.  ?   Tenderness: There is no abdominal tenderness. There is no guarding or rebound.  ?Musculoskeletal:     ?   General: No swelling or tenderness.  ?   Cervical back: Neck supple.  ?Skin: ?   General: Skin is warm and dry.  ?   Capillary Refill: Capillary refill takes less than 2 seconds.  ?Neurological:  ?   General: No focal deficit present.  ?   Mental Status: He is alert and oriented to person, place, and time.  ?   Sensory: No sensory deficit.  ?Psychiatric:     ?   Mood and Affect: Mood normal.  ? ? ?ED Results / Procedures /  Treatments   ?Labs ?(all labs ordered are listed, but only abnormal results are displayed) ?Labs Reviewed  ?BASIC METABOLIC PANEL - Abnormal; Notable for the following components:  ?    Result Value  ? Glucose, Bld 122 (*)   ? All other components within normal limits  ?CBC - Abnormal; Notable for the following components:  ? RBC 3.68 (*)   ? Hemoglobin 8.4 (*)   ? HCT 27.4 (*)   ? MCV 74.5 (*)   ? MCH 22.8 (*)   ? RDW 17.7 (*)   ? Platelets 411 (*)   ? All other components within normal limits  ?POC OCCULT BLOOD, ED - Abnormal; Notable for the following components:  ? Fecal Occult Bld POSITIVE (*)   ? All other components within normal limits  ?URINALYSIS, ROUTINE W REFLEX MICROSCOPIC  ?CK  ?CREATININE, URINE, RANDOM  ?HEPATIC FUNCTION PANEL  ?MAGNESIUM  ?PHOSPHORUS  ?PREALBUMIN  ?PROTIME-INR  ?TSH  ?URINALYSIS, COMPLETE (UACMP) WITH MICROSCOPIC  ?CBC  ?CBC  ?CBC  ?CBC  ?CBC  ?TYPE AND SCREEN  ?TROPONIN I (HIGH SENSITIVITY)  ? ? ?EKG ?EKG Interpretation ? ?Date/Time:  Saturday July 04 2021 18:16:00 EDT ?Ventricular Rate:  60 ?PR Interval:  168 ?QRS Duration: 102 ?QT Interval:  450 ?QTC Calculation: 450 ?R Axis:   99 ?Text Interpretation: Normal sinus rhythm Rightward axis Borderline ECG When compared with ECG of 19-Mar-2021 06:18, PREVIOUS ECG IS PRESENT Confirmed by Noemi Chapel [phone]) on 07/04/2021 9:57:20 PM ? ?Radiology ?CT Head Wo Contrast ? ?Result Date: 07/04/2021 ?CLINICAL DATA:  Un witnessed fall with headaches and neck pain, initial encounter EXAM: CT HEAD WITHOUT CONTRAST CT CERVICAL SPINE WITHOUT CONTRAST TECHNIQUE: Multidetector CT imaging of the head and cervical spine was performed following the standard protocol without intravenous contrast. Multiplanar CT image reconstructions of the cervical spine were also generated. RADIATION DOSE REDUCTION: This exam was performed according to the departmental dose-optimization program which includes automated exposure control, adjustment of the mA and/or kV  according to patient size and/or use of iterative reconstruction technique. COMPARISON:  03/19/2021 FINDINGS: CT HEAD FINDINGS Brain: No evidence of acute infarction, hemorrhage, hydrocephalus, extra-axial collection or mass lesion/mass effect. Mild atrophic changes and chronic white matter ischemic changes are noted similar to that seen on prior exam. Vascular: No hyperdense vessel or unexpected calcification. Skull: Normal. Negative for fracture or focal lesion. Sinuses/Orbits: No acute finding. Other: None. CT CERVICAL SPINE FINDINGS Alignment: Stable loss of the normal cervical lordosis is seen. Mild anterolisthesis of C4 on C5 and C5 on C6 is noted. Skull base and vertebrae: Multilevel osteophytic changes are seen. No acute fracture or acute  facet abnormality is noted. Multilevel facet hypertrophic changes are seen. The odontoid is within normal limits. Soft tissues and spinal canal: Surrounding soft tissue structures demonstrate bilateral thyroid hypodensities better visualized than on the prior exam. The largest of these lies on the left measuring up to 19 mm. Upper chest: Visualized lung apices are within normal limits. Other: None IMPRESSION: CT of the head: Chronic atrophic and ischemic changes without acute abnormality. CT of the cervical spine: Multilevel degenerative change without acute abnormality. Bilateral hypodense nodules within the thyroid measuring up to 19 mm. Recommend nonemergent thyroid US (ref: J Am Coll Radiol. 2015 Feb;12(2): 143-50). Electronically Signed   By: Inez Catalina M.D.   On: 07/04/2021 23:05  ? ?CT Cervical Spine Wo Contrast ? ?Result Date: 07/04/2021 ?CLINICAL DATA:  Un witnessed fall with headaches and neck pain, initial encounter EXAM: CT HEAD WITHOUT CONTRAST CT CERVICAL SPINE WITHOUT CONTRAST TECHNIQUE: Multidetector CT imaging of the head and cervical spine was performed following the standard protocol without intravenous contrast. Multiplanar CT image reconstructions of  the cervical spine were also generated. RADIATION DOSE REDUCTION: This exam was performed according to the departmental dose-optimization program which includes automated exposure control, adjustment of the mA and/or kV

## 2021-07-04 NOTE — Evaluation (Signed)
Physical Therapy Evaluation ?Patient Details ?Name: Matthew Knight ?MRN: [id number] ?DOB: 1940/10/15 ?Today's Date: 07/04/2021 ? ?History of Present Illness ? 81 year old male with a history of dementia, TIA, hypertension, hyperlipidemia presented with rectal bleeding.  Patient has assistance aids from the [place],  ?Clinical Impression ? Pt admitted with above diagnosis.  Pt currently with functional limitations due to the deficits listed below (see PT Problem List). Pt will benefit from skilled PT to increase their independence and safety with mobility to allow discharge to the venue listed below.  Pt reports living in 2 story house, but per CM/SW note lives at [facility].  He was able to ambulate without AD with one small LOB that he self corrected.  Recommend HHPT to assess how he does in his own environment. ?   ?   ? ?Recommendations for follow up therapy are one component of a multi-disciplinary discharge planning process, led by the attending physician.  Recommendations may be updated based on patient status, additional functional criteria and insurance authorization. ? ?Follow Up Recommendations Home health PT ? ?  ?Assistance Recommended at Discharge Intermittent Supervision/Assistance  ?Patient can return home with the following ? Direct supervision/assist for medications management;Direct supervision/assist for financial management;Assistance with cooking/housework ? ?  ?Equipment Recommendations None recommended by PT  ?Recommendations for Other Services ?    ?  ?Functional Status Assessment    ? ?  ?Precautions / Restrictions    ? ?  ? ?Mobility ? Bed Mobility ?Overal bed mobility: Independent ?  ?  ?  ?  ?  ?  ?General bed mobility comments: no  A needed ?  ? ?Transfers ?Overall transfer level: Needs assistance ?Equipment used: None ?Transfers: Sit to/from Stand ?Sit to Stand: Supervision ?  ?  ?  ?  ?  ?  ?  ? ?Ambulation/Gait ?Ambulation/Gait assistance: Min guard ?Gait Distance  (Feet): 120 Feet (plus 250) ?Assistive device: Rolling walker (2 wheels), None ?Gait Pattern/deviations: Step-through pattern ?  ?  ?  ?General Gait Details: Amb 76' with a RW and then without for remaining 70'. Sitting rest break to check o2 96% HR 107. Then ambulated 250' without AD and one small LOB at end when he turned his head quickly. ? ?Stairs ?  ?  ?  ?  ?  ? ?Wheelchair Mobility ?  ? ?Modified Rankin (Stroke Patients Only) ?  ? ?  ? ?Balance Overall balance assessment: Needs assistance ?  ?  ?  ?  ?  ?Standing balance-Leahy Scale: Fair ?  ?  ?  ?  ?  ?  ?  ?  ?  ?  ?  ?  ?   ? ? ? ?Pertinent Vitals/Pain Pain Assessment ?Pain Assessment: No/denies pain  ? ? ?Home Living Family/patient expects to be discharged to:: Private residence (at [place]) ?Living Arrangements: Alone ?  ?  ?  ?  ?  ?  ?  ?  ?Additional Comments: Per chart he is living at [facility], but pt stated he lived in a 2 story house with his dog.  ?  ?Prior Function Prior Level of Function : Patient poor historian/Family not available ?  ?  ?  ?  ?  ?  ?Mobility Comments: States he ambulated without assistance ?  ?  ? ? ?Hand Dominance  ?   ? ?  ?Extremity/Trunk Assessment  ? Upper Extremity Assessment ?Upper Extremity Assessment: Overall WFL for tasks assessed ?  ? ?  Lower Extremity Assessment ?Lower Extremity Assessment: Overall WFL for tasks assessed ?  ? ?   ?Communication  ? Communication: HOH  ?Cognition   ?  ?Overall Cognitive Status: No family/caregiver present to determine baseline cognitive functioning ?Area of Impairment: Problem solving, Memory ?  ?  ?  ?  ?  ?  ?  ?  ?  ?  ?Memory: Decreased short-term memory ?  ?  ?  ?Problem Solving: Decreased initiation ?General Comments: Pt is very HOH and so hard to determine at times if this what was limiting him vs cognition.  He did state he lived in a 2 level home. Per SW/CM note he moved into [facility] about 2 months ago. ?  ?  ? ?   ?General Comments   ? ?  ?Exercises    ? ?Assessment/Plan  ?  ?PT Assessment Patient needs continued PT services  ?PT Problem List Decreased balance;Decreased strength;Decreased mobility;Decreased cognition ? ?   ?  ?PT Treatment Interventions DME instruction;Gait training;Functional mobility training;Therapeutic exercise;Therapeutic activities;Balance training   ? ?PT Goals (Current goals can be found in the Care Plan section)  ?Acute Rehab PT Goals ?Patient Stated Goal: none stated ?PT Goal Formulation: With patient ?Time For Goal Achievement: 07/18/21 ?Potential to Achieve Goals: Good ? ?  ?Frequency Min 3X/week ?  ? ? ?Co-evaluation   ?  ?  ?  ?  ? ? ?  ?AM-PAC PT "6 Clicks" Mobility  ?Outcome Measure Help needed turning from your back to your side while in a flat bed without using bedrails?: None ?Help needed moving from lying on your back to sitting on the side of a flat bed without using bedrails?: None ?Help needed moving to and from a bed to a chair (including a wheelchair)?: A Little ?Help needed standing up from a chair using your arms (e.g., wheelchair or bedside chair)?: A Little ?Help needed to walk in hospital room?: A Little ?Help needed climbing 3-5 steps with a railing? : A Lot ?6 Click Score: 19 ? ?  ?End of Session Equipment Utilized During Treatment: Gait belt ?Activity Tolerance: Patient tolerated treatment well ?Patient left: in bed;with call bell/phone within reach;with bed alarm set ?Nurse Communication: Mobility status ?PT Visit Diagnosis: Unsteadiness on feet (R26.81);Muscle weakness (generalized) (M62.81) ?  ? ?Time: 1497-0263 ?PT Time Calculation (min) (ACUTE ONLY): 24 min ? ? ?Charges:   PT Evaluation ?$PT Eval Moderate Complexity: 1 Mod ?PT Treatments ?$Gait Training: 8-22 mins ?  ?   ? ? ?Santiago Glad L. Tamala Julian, PT ? ?07/04/2021 ? ? ?Galen Manila ?07/04/2021, 1:13 PM ? ?

## 2021-07-04 NOTE — ED Notes (Signed)
PT off unit to CT.

## 2021-07-04 NOTE — ED Triage Notes (Addendum)
Pt to triage via [place] from [facility].  Discharged from [place] today and 30 min after arriving back at facility he had unwitnessed fall.  No obvious injury.  C/o L sided facial pain and L rib pain.  No blood thinners.  Hx of dementia.  C-collar in place on arrival. ?

## 2021-07-04 NOTE — Assessment & Plan Note (Signed)
Patient has a fall unsure how that happened could be secondary to progressive anemia.  Continue to monitor transfuse as needed for hemoglobin below 7 or rapid dropping ?

## 2021-07-04 NOTE — TOC Transition Note (Signed)
Transition of Care (TOC) - CM/SW Discharge Note ? ? ?Patient Details  ?Name: Matthew Knight ?MRN: [id number] ?Date of Birth: Jun 19, 1940 ? ?Transition of Care (TOC) CM/SW Contact:  ?Ross Ludwig, LCSW ?Phone Number: ?07/04/2021, 3:43 PM ? ? ?Clinical Narrative:    ? ?Patient lives at [facility] and currently being seen by [employer].  CSW faxed [place] orders to [place] at [place] so she is aware that patient is discharging today. ? ?Final next level of care: [place] ?Barriers to Discharge: Barriers Resolved ? ? ?Patient Goals and CMS Choice ?Patient states their goals for this hospitalization and ongoing recovery are:: To return back to his Indep Living at [facility] ?CMS Medicare.gov Compare Post Acute Care list provided to:: Patient Represenative (must comment) ?Choice offered to / list presented to : Adult Children ? ?Discharge Placement ?  ?           ?  ?  ?  ?  ? ?Discharge Plan and Services ?  ?  ?           ?  ?  ?  ?  ?  ?HH Arranged: PT ?[place] Agency: Other - See comment Secondary school teacher) ?Date HH Agency Contacted: 07/04/21 ?Time [place]: 7253 ?Representative spoke with at [place]: Rollene Fare ? ?Social Determinants of Health (SDOH) Interventions ?  ? ? ?Readmission Risk Interventions ?   ? View : No data to display.  ?  ?  ?  ? ? ? ? ? ?

## 2021-07-04 NOTE — Progress Notes (Signed)
AVS printed out and educational teaching completed with pt daughter, via telephone.  ?

## 2021-07-04 NOTE — Progress Notes (Signed)
[facility] ? ?PROGRESS NOTE ? ?Matthew Knight QVZ:[id number] DOB: 07-10-1940 DOA: 07/01/2021 ?PCP: Matthew Limbo, MD ? ? ?Brief HPI:   ?81 year old male with a history of dementia, TIA, hypertension, hyperlipidemia presented with rectal bleeding.  Patient has assistance aids from the [place], they came by to check on him this morning and while they were helping him change close that noticed that he had bloody stool and blood in the toilet.  They became concerned and called EMS.  Patient has had his DAPT held in January of this year due to GI bleed.  He was instructed to resume aspirin only about 3 weeks ago. ? ? ? ?Subjective  ? ?Patient seen and examined, was started on Flomax yesterday for BPH ? ? Assessment/Plan:  ? ? 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 ?Anemia ?-Secondary to rectal bleeding as above ?-Hemoglobin dropped to 7.6 yesterday ?-S/p 1 unit PRBC ?-Hemoglobin is stable at 9.2 ? ?BPH ?-Patient complained of difficulty in urination ?-Started on Flomax 0.4 mg daily ?-Symptoms have improved ? ?Hyperlipidemia ?-Continue pravastatin ? ?Hypertension ?-Blood pressure is stable ?-Continue lisinopril ? ?History of TIA ?-Patient was previously on aspirin and Plavix but then Plavix was stopped and restarted on aspirin due to recent GI bleed ?-Currently aspirin on hold ? ?PT evaluation ordered to see if patient needs to go to skilled nursing facility for rehab ? ? ? ?Medications ? ?  ? escitalopram  20 mg Oral Daily  ? folic acid  0.5 mg Oral Daily  ? isosorbide mononitrate  30 mg Oral Daily  ? latanoprost  1 drop Both Eyes QHS  ? lisinopril  10 mg Oral Daily  ? Netarsudil Dimesylate  1 drop Both Eyes Daily  ? pantoprazole (PROTONIX) IV  40 mg Intravenous Q24H  ?  pravastatin  20 mg Oral Daily  ? tamsulosin  0.4 mg Oral Daily  ? ? ? Data Reviewed:  ? ?CBG: ? ?No results for input(s): GLUCAP in the last 168 hours. ? ?SpO2: 98 %  ? ? ?Vitals:  ? 07/03/21 1412 07/03/21 2014 07/03/21 2101 07/04/21 0514  ?BP: (!) 150/70 (!) 108/46 (!) 112/53 (!) 154/66  ?Pulse: 62 67 64 74  ?Resp: (!) '22 18  18  '$ ?Temp: 97.6 ?F (36.4 ?C) (!) 97.3 ?F (36.3 ?C)  (!) 97.4 ?F (36.3 ?C)  ?TempSrc: Oral Oral  Oral  ?SpO2: 98% 96%  98%  ?Weight:      ?Height:      ? ? ? ? ?Data Reviewed: ? ?Basic Metabolic Panel: ?Recent Labs  ?Lab 07/01/21 ?1159 07/02/21 ?0007 07/03/21 ?3295  ?NA 141 139 141  ?K 3.8 3.4* 3.7  ?CL 111 110 111  ?CO2 '23 23 24  '$ ?GLUCOSE 109* 105* 91  ?BUN '14 14 10  '$ ?CREATININE 0.99 1.12 1.08  ?CALCIUM 8.9 8.7* 8.8*  ? ? ?CBC: ?Recent Labs  ?Lab 07/01/21 ?1159 07/01/21 ?1758 07/02/21 ?0007 07/02/21 ?1646 07/03/21 ?1884 07/04/21 ?0920  ?WBC 9.7  --  9.3 8.1 8.8 10.2  ?NEUTROABS  --   --   --   --  QUANTITY NOT SUFFICIENT, UNABLE TO PERFORM TEST  --   ?HGB 8.7* 8.4* 7.6*  7.7* 7.9* 9.3* 9.2*  ?HCT 28.9* 28.4* 26.2*  26.2* 26.6* 31.3* 29.9*  ?MCV 73.5*  --  74.9* 77.1* 75.8* 75.1*  ?PLT 392  --  370 339 403* 430*  ? ? ?LFT ?Recent Labs  ?Lab 07/01/21 ?1159 07/02/21 ?0007  ?AST 18 17  ?ALT 16 15  ?ALKPHOS 66 62  ?BILITOT 0.6 0.6  ?PROT 6.9 6.6  ?ALBUMIN 3.7 3.5  ? ?  ?Antibiotics: ?Anti-infectives (From admission, onward)  ? ? None  ? ?  ? ? ? ?DVT prophylaxis: SCDs ? ?Code Status: Full code ? ?Family Communication: No family at bedside ? ? ?CONSULTS  ? ? ?Objective  ? ? ?Physical Examination: ? ? ?General-appears in no acute distress ?Heart-S1-S2, regular, no murmur auscultated ?Lungs-clear to auscultation bilaterally, no wheezing or crackles auscultated ?Abdomen-soft, nontender, no organomegaly ?Extremities-no edema in the lower extremities ?Neuro-alert, oriented x3, no focal deficit noted ? ?Status is: Inpatient: Rectal bleeding ? ? ? ?  ? ? ? ? ? ?Matthew Knight ?  ?[facility] ?If  7PM-7AM, please contact night-coverage at [email or web address], ?Office  [phone] ? ? ?07/04/2021, 11:33 AM  LOS: 2 days  ? ? ? ? ? ? ? ? ? ? ?  ?

## 2021-07-04 NOTE — Assessment & Plan Note (Signed)
Possible source of recurrent rectal bleeding. ?

## 2021-07-04 NOTE — Assessment & Plan Note (Signed)
Monitor for any sundowing ?

## 2021-07-04 NOTE — Discharge Summary (Signed)
?Physician Discharge Summary ?  ?Patient: Matthew Knight MRN: [id number] DOB: Jul 25, 1940  ?Admit date:     07/01/2021  ?Discharge date: 07/04/21  ?Discharge Physician: [name]  ? ?PCP: [name], MD  ? ?Recommendations at discharge:  ? ?Hold aspirin for now ?Check CBC in 1 week at PCP office ?Discussed with PCP regarding restarting aspirin, as per GI he can restart aspirin in  1 week ? ?Discharge Diagnoses: ?Principal Problem: ?  GIB (gastrointestinal bleeding) ?Active Problems: ?  TIA (transient ischemic attack) ?  Benign essential HTN ?  Dementia without behavioral disturbance ([place]) ?  BPH (benign prostatic hyperplasia) ?  HLD (hyperlipidemia) ?  Rectal bleeding ? ?Resolved Problems: ?  * No resolved hospital problems. * ? ?Hospital Course: ?81 year old male with a history of dementia, TIA, hypertension, hyperlipidemia presented with rectal bleeding.  Patient has assistance aids from the [place], they came by to check on him this morning and while they were helping him change close that noticed that he had bloody stool and blood in the toilet.  They became concerned and called EMS.  Patient has had his DAPT held in January of this year due to GI bleed.  He was instructed to resume aspirin only about 3 weeks ago. ?  ? ?Assessment and Plan: ? ?Rectal bleeding ?-Patient presented with rectal bleeding ?-No further episodes in the hospital ?-He has history of colonic angiectasia's that were treated with argon plasma coagulation in January of this year ?-[facility] consulted; they have signed off ?-Recommend to hold aspirin for 1 week and restarted in 1 week ?-Colonoscopy if patient continues to have bleeding ?  ?Anemia ?-Secondary to rectal bleeding as above ?-Hemoglobin dropped to 7.6 yesterday ?-S/p 1 unit PRBC ?-Hemoglobin is stable at 9.2 ?  ?BPH ?-Patient complained of difficulty in urination ?-Started on Flomax 0.4 mg daily ?-Patient takes Rapaflo at home ?  ?Hyperlipidemia ?-Continue pravastatin ?   ?Hypertension ?-Blood pressure is stable ?-Continue lisinopril ?  ?History of TIA ?-Patient was previously on aspirin and Plavix but then Plavix was stopped and restarted on aspirin due to recent GI bleed ?-Currently aspirin on hold ?  ?PT evaluation ordered; he will need home health PT ?  ?  ? ?  ? ? ?Consultants: Gastroenterology ?Procedures performed:  ?Disposition: Home ?Diet recommendation:  ?Discharge Diet Orders (From admission, onward)  ? ?  Start     Ordered  ? 07/04/21 0000  Diet - low sodium heart healthy       ? 07/04/21 1347  ? ?  ?  ? ?  ? ?Regular diet ?DISCHARGE MEDICATION: ?Allergies as of 07/04/2021   ? ?   Reactions  ? Darunavir Other (See Comments)  ? Other reaction(s): Eruption of skin  ? Exelon [rivastigmine] Other (See Comments)  ? Brand name only for the patch, otherwise he will have a violent reaction.  ? Sulfa Antibiotics   ? Other reaction(s): Other (See Comments) ?States made him crazy  ? ?  ? ?  ?Medication List  ?  ? ?STOP taking these medications   ? ?aspirin EC 81 MG tablet ?  ? ?  ? ?TAKE these medications   ? ?acetaminophen 325 MG tablet ?Commonly known as: TYLENOL ?Take 2 tablets (650 mg total) by mouth every 4 (four) hours as needed for mild pain, moderate pain, fever or headache. ?  ?escitalopram 20 MG tablet ?Commonly known as: LEXAPRO ?Take 20 mg by mouth daily. ?  ?folic acid 998 MCG  tablet ?Commonly known as: FOLVITE ?Take 400 mcg by mouth daily. ?  ?isosorbide mononitrate 30 MG 24 hr tablet ?Commonly known as: IMDUR ?Take 30 mg by mouth daily. ?  ?latanoprost 0.005 % ophthalmic solution ?Commonly known as: XALATAN ?Place 1 drop into both eyes at bedtime. ?  ?lisinopril 10 MG tablet ?Commonly known as: ZESTRIL ?Take 10 mg by mouth daily. ?  ?meclizine 25 MG tablet ?Commonly known as: ANTIVERT ?Take 25 mg by mouth 3 (three) times daily as needed for dizziness. ?  ?omeprazole 40 MG capsule ?Commonly known as: PRILOSEC ?Take 1 capsule (40 mg total) by mouth daily. ?   ?polyethylene glycol 17 g packet ?Commonly known as: MiraLax ?Take 17 g by mouth daily as needed for moderate constipation. ?  ?pravastatin 20 MG tablet ?Commonly known as: PRAVACHOL ?Take 20 mg by mouth daily. ?  ?Rhopressa 0.02 % Soln ?Generic drug: Netarsudil Dimesylate ?Place 1 drop into both eyes daily at 6 (six) AM. ?  ?silodosin 8 MG Caps capsule ?Commonly known as: RAPAFLO ?Take 8 mg by mouth daily. ?  ? ?  ? ? ?Discharge Exam: ?Filed Weights  ? 07/02/21 2215  ?Weight: 96.6 kg  ? ?General-appears in no acute distress ?Heart-S1-S2, regular, no murmur auscultated ?Lungs-clear to auscultation bilaterally, no wheezing or crackles auscultated ?Abdomen-soft, nontender, no organomegaly ?Extremities-no edema in the lower extremities ?Neuro-alert, oriented x3, no focal deficit noted ? ?Condition at discharge: good ? ?The results of significant diagnostics from this hospitalization (including imaging, microbiology, ancillary and laboratory) are listed below for reference.  ? ?Imaging Studies: ?No results found. ? ?Microbiology: ?Results for orders placed or performed during the hospital encounter of 04/08/21  ?Urine Culture     Status: Abnormal  ? Collection Time: 04/08/21  6:12 PM  ? Specimen: Urine, Clean Catch  ?Result Value Ref Range Status  ? Specimen Description   Final  ?  URINE, CLEAN CATCH ?Performed at [facility], [place] [address], [place], [place] [address] ?  ? Special Requests   Final  ?  NONE ?Performed at [facility], [place] [address], [place], [place] [address] ?  ? Culture (A)  Final  ?  <10,000 COLONIES/mL INSIGNIFICANT GROWTH ?Performed at [facility], [place] [address], [place],  [address] ?  ? Report Status 04/10/2021 FINAL  Final  ? ? ?Labs: ?CBC: ?Recent Labs  ?Lab 07/01/21 ?1159 07/01/21 ?1758 07/02/21 ?0007 07/02/21 ?1646 07/03/21 ?2947 07/04/21 ?0920  ?WBC 9.7  --  9.3 8.1 8.8 10.2  ?NEUTROABS  --   --   --   --  QUANTITY NOT SUFFICIENT, UNABLE  TO PERFORM TEST  --   ?HGB 8.7* 8.4* 7.6*  7.7* 7.9* 9.3* 9.2*  ?HCT 28.9* 28.4* 26.2*  26.2* 26.6* 31.3* 29.9*  ?MCV 73.5*  --  74.9* 77.1* 75.8* 75.1*  ?PLT 392  --  370 339 403* 430*  ? ?Basic Metabolic Panel: ?Recent Labs  ?Lab 07/01/21 ?1159 07/02/21 ?0007 07/03/21 ?6546  ?NA 141 139 141  ?K 3.8 3.4* 3.7  ?CL 111 110 111  ?CO2 '23 23 24  '$ ?GLUCOSE 109* 105* 91  ?BUN '14 14 10  '$ ?CREATININE 0.99 1.12 1.08  ?CALCIUM 8.9 8.7* 8.8*  ? ?Liver Function Tests: ?Recent Labs  ?Lab 07/01/21 ?1159 07/02/21 ?0007  ?AST 18 17  ?ALT 16 15  ?ALKPHOS 66 62  ?BILITOT 0.6 0.6  ?PROT 6.9 6.6  ?ALBUMIN 3.7 3.5  ? ?CBG: ?No results for input(s): GLUCAP in  the last 168 hours. ? ?Discharge time spent: greater than 30 minutes. ? ?Signed: ?[name], MD ?[facility] ?07/04/2021 ?

## 2021-07-04 NOTE — Assessment & Plan Note (Signed)
Recently admitted for possibly rectal bleeding and resultant symptomatic anemia.  Just discharged to independent living now hemoglobin trended down slightly.  Will reconsult [facility] may need colonoscopy ?

## 2021-07-04 NOTE — H&P (Signed)
? ? ?[name]:[id number] DOB: 12-03-1940 DOA: 07/04/2021 ? ?  ?PCP: [name], MD   ?Outpatient Specialists:  ?  ?[facility] ?  ? ?Patient arrived to ER on 07/04/21 at [place] ?Referred by Attending Toy Baker, MD ? ? ?Patient coming from:   ?  ?From facility  independent living ? ?Chief Complaint:   ?Chief Complaint  ?Patient presents with  ? Fall  ? ? ?HPI: ?Matthew Knight is a 81 y.o. male with medical history significant of dementia, TIA, HTN, HLD, AVM, rectal bleeding ?  ? ?Presented with   fall ?Discharged from [facility] for symptomatic anemia lives in independent living  ?Had another fall he does not know ho he fell ?Had hx of [place] bleed ?Was on Plavix for TIA was held ?Was only on Aspirin had another bleed ?Now not on aspirin now ? ? Pt only has been discharged for few hours ?  ?Regarding pertinent Chronic problems:   ? ? Hyperlipidemia -  on statins  pravachol ?Lipid Panel  ?   ?Component Value Date/Time  ? CHOL 161 03/09/2021 0700  ? TRIG 114 03/09/2021 0700  ? HDL 42 03/09/2021 0700  ? CHOLHDL 3.8 03/09/2021 0700  ? VLDL 23 03/09/2021 0700  ? [place] 96 03/09/2021 0700  ? ? ? HTN on Imdur, lisinopril ? ? chronic CHF diastolic/systolic/ combined - last echoJan 2023 ?EF 60 to 65% G1 DDx ?  ? Hx of TIA -  with/out residual deficits on statin aspirin and Plavix on hold ?  ? BPH - on Flomax,   ?   Chronic anemia - baseline hg Hemoglobin & Hematocrit  ?Recent Labs  ?  07/03/21 ?0741 07/04/21 ?0920 07/04/21 ?1813  ?HGB 9.3* 9.2* 8.4*  ? ?Dementia ? ?While in ER: ?  ? ?Hemoglobin noted to be slightly down imaging showing no evidence of head bleed ? ? ? ?CT HEADChronic atrophic and ischemic changes without acute ?abnormality. ? ?CXR -  ?Ordered  ?  ? ?Following Medications were ordered in ER: ?Medications  ?sodium chloride flush (NS) 0.9 % injection 3 mL (3 mLs Intravenous Given 07/04/21 2250)  ?lactated ringers bolus 500 mL (500 mLs Intravenous New Bag/Given 07/04/21 2309)  ?   ?_______________________________________________________ ?ER Provider Called:  [facility]   Dr. Michail Sermon ?  ?  ?ED Triage Vitals [07/04/21 1812]  ?Enc Vitals Group  ?   BP 127/62  ?   Pulse Rate 63  ?   Resp 16  ?   Temp 97.8 ?F (36.6 ?C)  ?   Temp Source Oral  ?   SpO2 97 %  ?   Weight   ?   Height   ?   Head Circumference   ?   Peak Flow   ?   Pain Score   ?   Pain Loc   ?   Pain Edu?   ?   Excl. in [place]?   ?EPPI(95)@    ? _________________________________________ ?Significant initial  Findings: ?Abnormal Labs Reviewed  ?BASIC METABOLIC PANEL - Abnormal; Notable for the following components:  ?    Result Value  ? Glucose, Bld 122 (*)   ? All other components within normal limits  ?CBC - Abnormal; Notable for the following components:  ? RBC 3.68 (*)   ? Hemoglobin 8.4 (*)   ? HCT 27.4 (*)   ? MCV 74.5 (*)   ? MCH 22.8 (*)   ? RDW 17.7 (*)   ?  Platelets 411 (*)   ? All other components within normal limits  ?POC OCCULT BLOOD, ED - Abnormal; Notable for the following components:  ? Fecal Occult Bld POSITIVE (*)   ? All other components within normal limits  ? ?  ?_________________________ ?Troponin  ordered ?ECG: Ordered ?Personally reviewed by me showing: ?HR : 60 ?Rhythm: Normal sinus rhythm ?Rightward axis ?Borderline ECG ?QTC 450 ?  ?The recent clinical data is shown below. ?Vitals:  ? 07/04/21 1812 07/04/21 2232  ?BP: 127/62 (!) 108/56  ?Pulse: 63 66  ?Resp: 16 14  ?Temp: 97.8 ?F (36.6 ?C)   ?TempSrc: Oral   ?SpO2: 97% 97%  ? ?  ?WBC ? ?   ?Component Value Date/Time  ? WBC 9.1 07/04/2021 1813  ? LYMPHSABS QUANTITY NOT SUFFICIENT, UNABLE TO PERFORM TEST 07/03/2021 0741  ? MONOABS QUANTITY NOT SUFFICIENT, UNABLE TO PERFORM TEST 07/03/2021 0741  ? EOSABS QUANTITY NOT SUFFICIENT, UNABLE TO PERFORM TEST 07/03/2021 0741  ? BASOSABS QUANTITY NOT SUFFICIENT, UNABLE TO PERFORM TEST 07/03/2021 0741  ?  ? ? UA   no evidence of UTI   ?  ?Urine analysis: ?   ?Component Value Date/Time  ? [place] YELLOW 07/04/2021 2048  ?  APPEARANCEUR CLEAR 07/04/2021 2048  ? LABSPEC 1.015 07/04/2021 2048  ? PHURINE 5.0 07/04/2021 2048  ? GLUCOSEU NEGATIVE 07/04/2021 2048  ? [place] NEGATIVE 07/04/2021 2048  ? [place] NEGATIVE 07/04/2021 2048  ? [place] NEGATIVE 07/04/2021 2048  ? PROTEINUR NEGATIVE 07/04/2021 2048  ? NITRITE NEGATIVE 07/04/2021 2048  ? LEUKOCYTESUR NEGATIVE 07/04/2021 2048  ? ? ?Results for orders placed or performed during the hospital encounter of 04/08/21  ?Urine Culture     Status: Abnormal  ? Collection Time: 04/08/21  6:12 PM  ? Specimen: Urine, Clean Catch  ?Result Value Ref Range Status  ? Specimen Description   Final  ?  URINE, CLEAN CATCH ?Performed at [facility], [place] [address], [place], [place] [address] ?  ? Special Requests   Final  ?  NONE ?Performed at [facility], [place] [address], [place], [place] [address] ?  ? Culture (A)  Final  ?  <10,000 COLONIES/mL INSIGNIFICANT GROWTH ?Performed at [facility], [place] [address] [place], [place] [address] ?  ? Report Status 04/10/2021 FINAL  Final  ? ? ? ?_______________________________________________ ?Hospitalist was called for admission for fall and symptomatic anemia ? ? ?The following Work up has been ordered so far: ? ?Orders Placed This Encounter  ?Procedures  ? CT Head Wo Contrast  ? CT Cervical Spine Wo Contrast  ? Basic metabolic panel  ? CBC  ? Urinalysis, Routine w reflex microscopic  ? CK  ? Creatinine, urine, random  ? Hepatic function panel  ? Magnesium  ? Phosphorus  ? Prealbumin  ? Protime-INR  ? TSH  ? Urinalysis, Complete w Microscopic Urine, Clean Catch  ? CBC  ? Document Height and Actual Weight  ? Cardiac monitoring  ? [place] IV, Maintain IV access  ? Orthostatic vital signs  ? Cardiac monitoring  ? Consult for [place] Admission  ? Pulse oximetry, continuous  ? POC occult blood, ED  ? ED EKG  ? EKG 12-Lead  ? Type and screen [place]  ? Place in observation (patient's  expected length of stay will be less than 2 midnights)  ?  ? ?OTHER Significant initial  Findings: ? ?labs showing: ? ?  ?Recent Labs  ?Lab  07/01/21 ?1159 07/02/21 ?0007 07/03/21 ?0962 07/04/21 ?1813  ?NA 141 139 141 140  ?K 3.8 3.4* 3.7 3.6  ?CO2 '23 23 24 27  '$ ?GLUCOSE 109* 105* 91 122*  ?BUN '14 14 10 9  '$ ?CREATININE 0.99 1.12 1.08 1.21  ?CALCIUM 8.9 8.7* 8.8* 9.1  ? ? ?Cr   stable,    ?Lab Results  ?Component Value Date  ? CREATININE 1.21 07/04/2021  ? CREATININE 1.08 07/03/2021  ? CREATININE 1.12 07/02/2021  ? ? ?Recent Labs  ?Lab 07/01/21 ?1159 07/02/21 ?0007  ?AST 18 17  ?ALT 16 15  ?ALKPHOS 66 62  ?BILITOT 0.6 0.6  ?PROT 6.9 6.6  ?ALBUMIN 3.7 3.5  ? ?Lab Results  ?Component Value Date  ? CALCIUM 9.1 07/04/2021  ? ?    ?Plt: ?Lab Results  ?Component Value Date  ? PLT 411 (H) 07/04/2021  ?  ?   ?Recent Labs  ?Lab 07/02/21 ?0007 07/02/21 ?1646 07/03/21 ?0741 07/04/21 ?0920 07/04/21 ?1813  ?WBC 9.3 8.1 8.8 10.2 9.1  ?NEUTROABS  --   --  QUANTITY NOT SUFFICIENT, UNABLE TO PERFORM TEST  --   --   ?HGB 7.6*  7.7* 7.9* 9.3* 9.2* 8.4*  ?HCT 26.2*  26.2* 26.6* 31.3* 29.9* 27.4*  ?MCV 74.9* 77.1* 75.8* 75.1* 74.5*  ?PLT 370 339 403* 430* 411*  ? ? ?HG/HCT * stable,  Down *Up from baseline see below ?   ?Component Value Date/Time  ? HGB 8.4 (L) 07/04/2021 1813  ? HCT 27.4 (L) 07/04/2021 1813  ? MCV 74.5 (L) 07/04/2021 1813  ? ? ? ? ?No results for input(s): LIPASE, AMYLASE in the last 168 hours. ?No results for input(s): AMMONIA in the last 168 hours. ?  ? ?Cardiac Panel (last 3 results) ?No results for input(s): CKTOTAL, CKMB, TROPONINI, RELINDX in the last 72 hours. ? .car ?BNP (last 3 results) ?No results for input(s): BNP in the last 8760 hours. ?  ? ?DM  labs:  ?HbA1C: ?Recent Labs  ?  03/09/21 ?0700  ?HGBA1C 5.8*  ? ?  ?  ?CBG (last 3)  ?No results for input(s): GLUCAP in the last 72 hours. ?  ? ?    ?Cultures: ?   ?Component Value Date/Time  ? SDES  04/08/2021 1812  ?  URINE, CLEAN CATCH ?Performed at [facility], [place] [address], [place], [place] [address] ?  ? SPECREQUEST  04/08/2021 1812  ?  NONE ?Performed at [facility], [place] [address] [place], [place] [address] ?  ? CULT (A) 02/01/2

## 2021-07-04 NOTE — Assessment & Plan Note (Signed)
Restart Pravastatin ?

## 2021-07-04 NOTE — Assessment & Plan Note (Signed)
Downtrending blood pressures will hold blood pressure medications for tonight ?Allow permissive hypertension ?

## 2021-07-04 NOTE — Assessment & Plan Note (Signed)
Aspirin and Plavix on hold given recurrent GI bleeding.  Continue statin ?

## 2021-07-04 NOTE — Assessment & Plan Note (Signed)
PT OT assessment prior to discharge °

## 2021-07-05 ENCOUNTER — Encounter (HOSPITAL_COMMUNITY)
Admission: EM | Disposition: A | Discharge status: Home Health | Payer: Self-pay | Source: Skilled Nursing Facility | Attending: Internal Medicine

## 2021-07-05 ENCOUNTER — Observation Stay (HOSPITAL_COMMUNITY): Payer: Medicare Other

## 2021-07-05 DIAGNOSIS — F17290 Nicotine dependence, other tobacco product, uncomplicated: Secondary | ICD-10-CM | POA: Diagnosis present

## 2021-07-05 DIAGNOSIS — F0393 Unspecified dementia, unspecified severity, with mood disturbance: Secondary | ICD-10-CM | POA: Diagnosis present

## 2021-07-05 DIAGNOSIS — D62 Acute posthemorrhagic anemia: Secondary | ICD-10-CM | POA: Diagnosis present

## 2021-07-05 DIAGNOSIS — I5042 Chronic combined systolic (congestive) and diastolic (congestive) heart failure: Secondary | ICD-10-CM | POA: Diagnosis present

## 2021-07-05 DIAGNOSIS — E876 Hypokalemia: Secondary | ICD-10-CM | POA: Diagnosis not present

## 2021-07-05 DIAGNOSIS — K5521 Angiodysplasia of colon with hemorrhage: Secondary | ICD-10-CM | POA: Diagnosis present

## 2021-07-05 DIAGNOSIS — D649 Anemia, unspecified: Secondary | ICD-10-CM | POA: Diagnosis not present

## 2021-07-05 DIAGNOSIS — K640 First degree hemorrhoids: Secondary | ICD-10-CM | POA: Diagnosis present

## 2021-07-05 DIAGNOSIS — Z8249 Family history of ischemic heart disease and other diseases of the circulatory system: Secondary | ICD-10-CM | POA: Diagnosis not present

## 2021-07-05 DIAGNOSIS — F0390 Unspecified dementia without behavioral disturbance: Secondary | ICD-10-CM | POA: Diagnosis not present

## 2021-07-05 DIAGNOSIS — D72829 Elevated white blood cell count, unspecified: Secondary | ICD-10-CM | POA: Diagnosis not present

## 2021-07-05 DIAGNOSIS — H9190 Unspecified hearing loss, unspecified ear: Secondary | ICD-10-CM | POA: Diagnosis present

## 2021-07-05 DIAGNOSIS — E785 Hyperlipidemia, unspecified: Secondary | ICD-10-CM | POA: Diagnosis present

## 2021-07-05 DIAGNOSIS — H409 Unspecified glaucoma: Secondary | ICD-10-CM | POA: Diagnosis present

## 2021-07-05 DIAGNOSIS — I951 Orthostatic hypotension: Secondary | ICD-10-CM | POA: Diagnosis present

## 2021-07-05 DIAGNOSIS — Z66 Do not resuscitate: Secondary | ICD-10-CM | POA: Diagnosis present

## 2021-07-05 DIAGNOSIS — K5520 Angiodysplasia of colon without hemorrhage: Secondary | ICD-10-CM | POA: Diagnosis not present

## 2021-07-05 DIAGNOSIS — E663 Overweight: Secondary | ICD-10-CM | POA: Diagnosis present

## 2021-07-05 DIAGNOSIS — E119 Type 2 diabetes mellitus without complications: Secondary | ICD-10-CM | POA: Diagnosis present

## 2021-07-05 DIAGNOSIS — Z6828 Body mass index (BMI) 28.0-28.9, adult: Secondary | ICD-10-CM | POA: Diagnosis not present

## 2021-07-05 DIAGNOSIS — K219 Gastro-esophageal reflux disease without esophagitis: Secondary | ICD-10-CM | POA: Diagnosis present

## 2021-07-05 DIAGNOSIS — Z833 Family history of diabetes mellitus: Secondary | ICD-10-CM | POA: Diagnosis not present

## 2021-07-05 DIAGNOSIS — R55 Syncope and collapse: Secondary | ICD-10-CM | POA: Diagnosis present

## 2021-07-05 DIAGNOSIS — I10 Essential (primary) hypertension: Secondary | ICD-10-CM | POA: Diagnosis not present

## 2021-07-05 DIAGNOSIS — K635 Polyp of colon: Secondary | ICD-10-CM | POA: Diagnosis present

## 2021-07-05 DIAGNOSIS — N400 Enlarged prostate without lower urinary tract symptoms: Secondary | ICD-10-CM | POA: Diagnosis present

## 2021-07-05 DIAGNOSIS — D75839 Thrombocytosis, unspecified: Secondary | ICD-10-CM | POA: Diagnosis not present

## 2021-07-05 DIAGNOSIS — I110 Hypertensive heart disease with heart failure: Secondary | ICD-10-CM | POA: Diagnosis present

## 2021-07-05 DIAGNOSIS — F0394 Unspecified dementia, unspecified severity, with anxiety: Secondary | ICD-10-CM | POA: Diagnosis present

## 2021-07-05 DIAGNOSIS — D490 Neoplasm of unspecified behavior of digestive system: Secondary | ICD-10-CM | POA: Diagnosis not present

## 2021-07-05 HISTORY — PX: GIVENS CAPSULE STUDY: SHX5432

## 2021-07-05 LAB — PHOSPHORUS: Phosphorus: 4.2 mg/dL (ref 2.5–4.6)

## 2021-07-05 LAB — CBC
HCT: 28.6 % — ABNORMAL LOW (ref 39.0–52.0)
HCT: 25.8 % — ABNORMAL LOW (ref 39.0–52.0)
HCT: 27.1 % — ABNORMAL LOW (ref 39.0–52.0)
HCT: 28.4 % — ABNORMAL LOW (ref 39.0–52.0)
Hemoglobin: 8.9 g/dL — ABNORMAL LOW (ref 13.0–17.0)
Hemoglobin: 7.8 g/dL — ABNORMAL LOW (ref 13.0–17.0)
Hemoglobin: 8.4 g/dL — ABNORMAL LOW (ref 13.0–17.0)
Hemoglobin: 8.7 g/dL — ABNORMAL LOW (ref 13.0–17.0)
MCH: 23.1 pg — ABNORMAL LOW (ref 26.0–34.0)
MCH: 22.7 pg — ABNORMAL LOW (ref 26.0–34.0)
MCH: 22.8 pg — ABNORMAL LOW (ref 26.0–34.0)
MCH: 22.8 pg — ABNORMAL LOW (ref 26.0–34.0)
MCHC: 31.1 g/dL (ref 30.0–36.0)
MCHC: 30.2 g/dL (ref 30.0–36.0)
MCHC: 31 g/dL (ref 30.0–36.0)
MCHC: 30.6 g/dL (ref 30.0–36.0)
MCV: 74.1 fL — ABNORMAL LOW (ref 80.0–100.0)
MCV: 75 fL — ABNORMAL LOW (ref 80.0–100.0)
MCV: 73.6 fL — ABNORMAL LOW (ref 80.0–100.0)
MCV: 74.3 fL — ABNORMAL LOW (ref 80.0–100.0)
Platelets: 403 10*3/uL — ABNORMAL HIGH (ref 150–400)
Platelets: 417 10*3/uL — ABNORMAL HIGH (ref 150–400)
Platelets: 431 10*3/uL — ABNORMAL HIGH (ref 150–400)
Platelets: 443 10*3/uL — ABNORMAL HIGH (ref 150–400)
RBC: 3.86 MIL/uL — ABNORMAL LOW (ref 4.22–5.81)
RBC: 3.44 MIL/uL — ABNORMAL LOW (ref 4.22–5.81)
RBC: 3.68 MIL/uL — ABNORMAL LOW (ref 4.22–5.81)
RBC: 3.82 MIL/uL — ABNORMAL LOW (ref 4.22–5.81)
RDW: 17.8 % — ABNORMAL HIGH (ref 11.5–15.5)
RDW: 17.9 % — ABNORMAL HIGH (ref 11.5–15.5)
RDW: 17.9 % — ABNORMAL HIGH (ref 11.5–15.5)
RDW: 17.9 % — ABNORMAL HIGH (ref 11.5–15.5)
WBC: 9.5 10*3/uL (ref 4.0–10.5)
WBC: 8.9 10*3/uL (ref 4.0–10.5)
WBC: 8.6 10*3/uL (ref 4.0–10.5)
WBC: 9 10*3/uL (ref 4.0–10.5)
nRBC: 0 % (ref 0.0–0.2)
nRBC: 0 % (ref 0.0–0.2)
nRBC: 0 % (ref 0.0–0.2)
nRBC: 0 % (ref 0.0–0.2)

## 2021-07-05 LAB — COMPREHENSIVE METABOLIC PANEL
ALT: 14 U/L (ref 0–44)
AST: 14 U/L — ABNORMAL LOW (ref 15–41)
Albumin: 3 g/dL — ABNORMAL LOW (ref 3.5–5.0)
Alkaline Phosphatase: 50 U/L (ref 38–126)
Anion gap: 5 (ref 5–15)
BUN: 10 mg/dL (ref 8–23)
CO2: 26 mmol/L (ref 22–32)
Calcium: 8.4 mg/dL — ABNORMAL LOW (ref 8.9–10.3)
Chloride: 106 mmol/L (ref 98–111)
Creatinine, Ser: 1.14 mg/dL (ref 0.61–1.24)
GFR, Estimated: >60 mL/min (ref >60)
Glucose, Bld: 103 mg/dL — ABNORMAL HIGH (ref 70–99)
Potassium: 3.5 mmol/L (ref 3.5–5.1)
Sodium: 137 mmol/L (ref 135–145)
Total Bilirubin: 0.5 mg/dL (ref 0.3–1.2)
Total Protein: 5.6 g/dL — ABNORMAL LOW (ref 6.5–8.1)

## 2021-07-05 LAB — RETICULOCYTES
Immature Retic Fract: 30 % — ABNORMAL HIGH (ref 2.3–15.9)
RBC.: 3.79 MIL/uL — ABNORMAL LOW (ref 4.22–5.81)
Retic Count, Absolute: 62.5 10*3/uL (ref 19.0–186.0)
Retic Ct Pct: 1.7 % (ref 0.4–3.1)

## 2021-07-05 LAB — HEPATIC FUNCTION PANEL
ALT: 14 U/L (ref 0–44)
AST: 16 U/L (ref 15–41)
Albumin: 3.3 g/dL — ABNORMAL LOW (ref 3.5–5.0)
Alkaline Phosphatase: 57 U/L (ref 38–126)
Bilirubin, Direct: 0.1 mg/dL (ref 0.0–0.2)
Indirect Bilirubin: 0.3 mg/dL (ref 0.3–0.9)
Total Bilirubin: 0.4 mg/dL (ref 0.3–1.2)
Total Protein: 6.3 g/dL — ABNORMAL LOW (ref 6.5–8.1)

## 2021-07-05 LAB — TYPE AND SCREEN
ABO/RH(D): O POS
Antibody Screen: NEGATIVE

## 2021-07-05 LAB — TROPONIN I (HIGH SENSITIVITY): Troponin I (High Sensitivity): 7 ng/L (ref <18)

## 2021-07-05 LAB — URINALYSIS, COMPLETE (UACMP) WITH MICROSCOPIC
Bilirubin Urine: NEGATIVE
Glucose, UA: NEGATIVE mg/dL
Hgb urine dipstick: NEGATIVE
Ketones, ur: NEGATIVE mg/dL
Leukocytes,Ua: NEGATIVE
Nitrite: NEGATIVE
Protein, ur: NEGATIVE mg/dL
Specific Gravity, Urine: 1.017 (ref 1.005–1.030)
pH: 5 (ref 5.0–8.0)

## 2021-07-05 LAB — IRON AND TIBC
Iron: 13 ug/dL — ABNORMAL LOW (ref 45–182)
Saturation Ratios: 3 % — ABNORMAL LOW (ref 17.9–39.5)
TIBC: 511 ug/dL — ABNORMAL HIGH (ref 250–450)
UIBC: 498 ug/dL

## 2021-07-05 LAB — PROTIME-INR
INR: 1 (ref 0.8–1.2)
Prothrombin Time: 13.5 seconds (ref 11.4–15.2)

## 2021-07-05 LAB — VITAMIN B12: Vitamin B-12: 472 pg/mL (ref 180–914)

## 2021-07-05 LAB — CK: Total CK: 58 U/L (ref 49–397)

## 2021-07-05 LAB — TSH: TSH: 0.85 u[IU]/mL (ref 0.350–4.500)

## 2021-07-05 LAB — MAGNESIUM: Magnesium: 2.1 mg/dL (ref 1.7–2.4)

## 2021-07-05 LAB — FERRITIN: Ferritin: 4 ng/mL — ABNORMAL LOW (ref 24–336)

## 2021-07-05 LAB — PREALBUMIN: Prealbumin: 18 mg/dL (ref 18–38)

## 2021-07-05 LAB — FOLATE: Folate: 21.8 ng/mL (ref >5.9)

## 2021-07-05 SURGERY — IMAGING PROCEDURE, GI TRACT, INTRALUMINAL, VIA CAPSULE
Anesthesia: LOCAL

## 2021-07-05 MED ORDER — DOCUSATE SODIUM 100 MG PO CAPS
100.0000 mg | ORAL_CAPSULE | Freq: Two times a day (BID) | ORAL | Status: DC
  Administered 2021-07-05 – 2021-07-11 (×11): 100 mg via ORAL
  Filled 2021-07-05 (×12): qty 1

## 2021-07-05 MED ORDER — SODIUM CHLORIDE 0.9 % IV SOLN
75.0000 mL/h | INTRAVENOUS | Status: AC
  Administered 2021-07-05: 75 mL/h via INTRAVENOUS

## 2021-07-05 MED ORDER — ESCITALOPRAM OXALATE 10 MG PO TABS
20.0000 mg | ORAL_TABLET | Freq: Every day | ORAL | Status: DC
  Administered 2021-07-05 – 2021-07-11 (×7): 20 mg via ORAL
  Filled 2021-07-05 (×7): qty 2

## 2021-07-05 MED ORDER — HYDROCODONE-ACETAMINOPHEN 5-325 MG PO TABS
1.0000 | ORAL_TABLET | ORAL | Status: DC | PRN
  Administered 2021-07-08: 2 via ORAL
  Filled 2021-07-05 (×2): qty 2

## 2021-07-05 MED ORDER — LISINOPRIL 10 MG PO TABS
10.0000 mg | ORAL_TABLET | Freq: Every day | ORAL | Status: DC
  Administered 2021-07-06 – 2021-07-11 (×6): 10 mg via ORAL
  Filled 2021-07-05 (×6): qty 1

## 2021-07-05 MED ORDER — MECLIZINE HCL 25 MG PO TABS
25.0000 mg | ORAL_TABLET | Freq: Every day | ORAL | Status: DC
  Administered 2021-07-05 – 2021-07-11 (×7): 25 mg via ORAL
  Filled 2021-07-05 (×8): qty 1

## 2021-07-05 MED ORDER — SODIUM CHLORIDE 0.9 % IV BOLUS
500.0000 mL | Freq: Once | INTRAVENOUS | Status: AC
  Administered 2021-07-05: 500 mL via INTRAVENOUS

## 2021-07-05 MED ORDER — LATANOPROST 0.005 % OP SOLN
1.0000 [drp] | Freq: Every day | OPHTHALMIC | Status: DC
  Administered 2021-07-05 – 2021-07-10 (×6): 1 [drp] via OPHTHALMIC
  Filled 2021-07-05: qty 2.5

## 2021-07-05 MED ORDER — PANTOPRAZOLE SODIUM 40 MG PO TBEC
40.0000 mg | DELAYED_RELEASE_TABLET | Freq: Every day | ORAL | Status: DC
  Administered 2021-07-05 – 2021-07-11 (×7): 40 mg via ORAL
  Filled 2021-07-05 (×7): qty 1

## 2021-07-05 MED ORDER — ACETAMINOPHEN 325 MG PO TABS
650.0000 mg | ORAL_TABLET | Freq: Four times a day (QID) | ORAL | Status: DC | PRN
  Administered 2021-07-08: 650 mg via ORAL
  Filled 2021-07-05: qty 2

## 2021-07-05 MED ORDER — TAMSULOSIN HCL 0.4 MG PO CAPS
0.4000 mg | ORAL_CAPSULE | Freq: Every day | ORAL | Status: DC
Start: 2021-07-05 — End: 2021-07-11
  Administered 2021-07-05 – 2021-07-10 (×6): 0.4 mg via ORAL
  Filled 2021-07-05 (×6): qty 1

## 2021-07-05 MED ORDER — ACETAMINOPHEN 650 MG RE SUPP: 650.0000 mg | Freq: Four times a day (QID) | RECTAL | Status: DC | PRN

## 2021-07-05 MED ORDER — PRAVASTATIN SODIUM 40 MG PO TABS
20.0000 mg | ORAL_TABLET | Freq: Every day | ORAL | Status: DC
  Administered 2021-07-05 – 2021-07-10 (×6): 20 mg via ORAL
  Filled 2021-07-05 (×6): qty 1

## 2021-07-05 SURGICAL SUPPLY — 1 items: TOWEL COTTON PACK 4EA (MISCELLANEOUS) ×4 IMPLANT

## 2021-07-05 NOTE — ED Notes (Signed)
ED TO INPATIENT HANDOFF REPORT ? ?ED Nurse Name and Phone #:  ? ?S ?Name/Age/Gender ?Matthew Knight ?81 y.o. ?male ?Room/Bed: 040C/040C ? ?Code Status ?  Code Status: DNR ? ?Home/SNF/Other ?Home ?Patient oriented to: self, place, time, and situation ?Is this baseline? Yes  ? ?Triage Complete: Triage complete  ?Chief Complaint ?Symptomatic anemia [D64.9] ? ?Triage Note ?Pt to triage via [place] from [facility].  Discharged from [place] today and 30 min after arriving back at facility he had unwitnessed fall.  No obvious injury.  C/o L sided facial pain and L rib pain.  No blood thinners.  Hx of dementia.  C-collar in place on arrival.  ? ?Allergies ?Allergies  ?Allergen Reactions  ? Darunavir Other (See Comments)  ?  Other reaction(s): Eruption of skin  ? Exelon [Rivastigmine] Other (See Comments)  ?  Brand name only for the patch, otherwise he will have a violent reaction.  ? Sulfa Antibiotics   ?  Other reaction(s): Other (See Comments) ?States made him crazy  ? ? ?Level of Care/Admitting Diagnosis ?ED Disposition   ? ? ED Disposition  ?Admit  ? Condition  ?--  ? Comment  ?Hospital Area: [facility] [[id number]] ? Level of Care: Telemetry Medical [104] ? May place patient in observation at [facility] or [place] if equivalent level of care is available:: No ? Covid Evaluation: Asymptomatic - no recent exposure (last 10 days) testing not required ? Diagnosis: Symptomatic anemia [[id number]] ? Admitting Physician: Toy Baker [[id number]] ? Attending Physician: Toy Baker [[id number]] ?  ?  ? ?  ? ? ?B ?Medical/Surgery History ?Past Medical History:  ?Diagnosis Date  ? Dementia ([place])   ? Diabetes mellitus without complication ([place])   ? Hearing loss   ? Hepatitis   ? Small vessel disease ([place])   ? Stroke [facility])   ? ?Past Surgical History:  ?Procedure Laterality Date  ? APPENDECTOMY    ? COLONOSCOPY WITH PROPOFOL N/A 03/24/2021  ? Procedure: COLONOSCOPY WITH PROPOFOL;  Surgeon: Mauri Pole, MD;  Location: [place] ENDOSCOPY;  Service: Endoscopy;  Laterality: N/A;  ? ESOPHAGOGASTRODUODENOSCOPY (EGD) WITH PROPOFOL N/A 03/24/2021  ? Procedure: ESOPHAGOGASTRODUODENOSCOPY (EGD) WITH PROPOFOL;  Surgeon: Mauri Pole, MD;  Location: [place] ENDOSCOPY;  Service: Endoscopy;  Laterality: N/A;  ? HEMOSTASIS CLIP PLACEMENT  03/24/2021  ? Procedure: HEMOSTASIS CLIP PLACEMENT;  Surgeon: Mauri Pole, MD;  Location: [place];  Service: Endoscopy;;  ? HOT HEMOSTASIS N/A 03/24/2021  ? Procedure: HOT HEMOSTASIS (ARGON PLASMA COAGULATION/BICAP);  Surgeon: Mauri Pole, MD;  Location: [facility] ENDOSCOPY;  Service: Endoscopy;  Laterality: N/A;  ? POLYPECTOMY  03/24/2021  ? Procedure: POLYPECTOMY;  Surgeon: Mauri Pole, MD;  Location: [place] ENDOSCOPY;  Service: Endoscopy;;  ?  ? ?A ?IV Location/Drains/Wounds ?Patient Lines/Drains/Airways Status   ? ? Active Line/Drains/Airways   ? ? Name Placement date Placement time Site Days  ? Peripheral IV 07/04/21 20 G Left Antecubital 07/04/21  2232  Antecubital  1  ? External Urinary Catheter 07/01/21  1510  --  4  ? ?  ?  ? ?  ? ? ?Intake/Output Last 24 hours ?No intake or output data in the 24 hours ending 07/05/21 0503 ? ?Labs/Imaging ?Results for orders placed or performed during the hospital encounter of 07/04/21 (from the past 48 hour(s))  ?Basic metabolic panel     Status: Abnormal  ? Collection Time: 07/04/21  6:13 PM  ?Result Value Ref Range  ? Sodium  140 135 - 145 mmol/L  ? Potassium 3.6 3.5 - 5.1 mmol/L  ? Chloride 105 98 - 111 mmol/L  ? CO2 27 22 - 32 mmol/L  ? Glucose, Bld 122 (H) 70 - 99 mg/dL  ?  Comment: Glucose reference range applies only to samples taken after fasting for at least 8 hours.  ? BUN 9 8 - 23 mg/dL  ? Creatinine, Ser 1.21 0.61 - 1.24 mg/dL  ? Calcium 9.1 8.9 - 10.3 mg/dL  ? GFR, Estimated >60 >60 mL/min  ?  Comment: (NOTE) ?Calculated using the CKD-EPI Creatinine Equation (2021) ?  ? Anion gap 8 5 - 15  ?  Comment: Performed at  [facility], [place] [address], [place], [place] [address]  ?CBC     Status: Abnormal  ? Collection Time: 07/04/21  6:13 PM  ?Result Value Ref Range  ? WBC 9.1 4.0 - 10.5 K/uL  ? RBC 3.68 (L) 4.22 - 5.81 MIL/uL  ? Hemoglobin 8.4 (L) 13.0 - 17.0 g/dL  ?  Comment: Reticulocyte Hemoglobin testing ?may be clinically indicated, ?consider ordering this additional ?test [id number] ?  ? HCT 27.4 (L) 39.0 - 52.0 %  ? MCV 74.5 (L) 80.0 - 100.0 fL  ? MCH 22.8 (L) 26.0 - 34.0 pg  ? MCHC 30.7 30.0 - 36.0 g/dL  ? RDW 17.7 (H) 11.5 - 15.5 %  ? Platelets 411 (H) 150 - 400 K/uL  ? nRBC 0.0 0.0 - 0.2 %  ?  Comment: Performed at [facility], [place] [address], [place], [place] [address]  ?Urinalysis, Routine w reflex microscopic Urine, Clean Catch     Status: None  ? Collection Time: 07/04/21  8:48 PM  ?Result Value Ref Range  ? Color, Urine YELLOW YELLOW  ? APPearance CLEAR CLEAR  ? Specific Gravity, Urine 1.015 1.005 - 1.030  ? pH 5.0 5.0 - 8.0  ? Glucose, UA NEGATIVE NEGATIVE mg/dL  ? Hgb urine dipstick NEGATIVE NEGATIVE  ? Bilirubin Urine NEGATIVE NEGATIVE  ? Ketones, ur NEGATIVE NEGATIVE mg/dL  ? Protein, ur NEGATIVE NEGATIVE mg/dL  ? Nitrite NEGATIVE NEGATIVE  ? Leukocytes,Ua NEGATIVE NEGATIVE  ?  Comment: Performed at [facility], [place] [address], [place], [place] [address]  ?POC occult blood, ED     Status: Abnormal  ? Collection Time: 07/04/21 10:23 PM  ?Result Value Ref Range  ? Fecal Occult Bld POSITIVE (A) NEGATIVE  ?CK     Status: None  ? Collection Time: 07/05/21  1:22 AM  ?Result Value Ref Range  ? Total CK 58 49 - 397 U/L  ?  Comment: Performed at [facility], [place] [address] [place], [place] [address]  ?Hepatic function panel     Status: Abnormal  ? Collection Time: 07/05/21  1:22 AM  ?Result Value Ref Range  ? Total Protein 6.3 (L) 6.5 - 8.1 g/dL  ? Albumin 3.3 (L) 3.5 - 5.0 g/dL  ? AST 16 15 - 41 U/L  ? ALT 14 0 - 44 U/L  ? Alkaline Phosphatase 57 38 - 126 U/L  ? Total Bilirubin 0.4 0.3 - 1.2  mg/dL  ? Bilirubin, Direct 0.1 0.0 - 0.2 mg/dL  ? Indirect Bilirubin 0.3 0.3 - 0.9 mg/dL  ?  Comment: Performed at [facility], [place] [address] [place], [place] [address]  ?Magnesium     Status: None  ? Collection Time: 07/05/21  1:22 AM  ?Result Value Ref Range  ?  Magnesium 2.1 1.7 - 2.4 mg/dL  ?  Comment: Performed at [facility], [place] [address] [place], [place] [address]  ?Phosphorus     Status: None  ? Collection Time: 07/05/21  1:22 AM  ?Result Value Ref Range  ? Phosphorus 4.2 2.5 - 4.6 mg/dL  ?  Comment: Performed at [facility], [place] [address] [place] [address], [place], [place] [address]  ?Protime-INR     Status: None  ? Collection Time: 07/05/21  1:22 AM  ?Result Value Ref Range  ? Prothrombin Time 13.5 11.4 - 15.2 seconds  ? INR 1.0 0.8 - 1.2  ?  Comment: (NOTE) ?INR goal varies based on device and disease states. ?Performed at [facility], [place] [address], [place], [place] ?[address] ?  ?Troponin I (High Sensitivity)     Status: None  ? Collection Time: 07/05/21  1:22 AM  ?Result Value Ref Range  ? Troponin I (High Sensitivity) 7 <18 ng/L  ?  Comment: (NOTE) ?Elevated high sensitivity troponin I (hsTnI) values and significant  ?changes across serial measurements may suggest ACS but many other  ?chronic and acute conditions are known to elevate hsTnI results.  ?Refer to the "Links" section for chest pain algorithms and additional  ?guidance. ?Performed at [facility], [place] [address] [place], [place] ?[address] ?  ?TSH     Status: None  ? Collection Time: 07/05/21  1:22 AM  ?Result Value Ref Range  ? TSH 0.850 0.350 - 4.500 uIU/mL  ?  Comment: Performed by a 3rd Generation assay with a functional sensitivity of <=0.01 uIU/mL. ?Performed at [facility], [place] [address], [place], [place] [address] ?  ?CBC     Status: Abnormal  ? Collection Time: 07/05/21  1:22 AM  ?Result Value Ref Range  ? WBC 9.5 4.0 - 10.5 K/uL  ? RBC 3.86 (L) 4.22 - 5.81 MIL/uL  ? Hemoglobin 8.9 (L) 13.0 - 17.0  g/dL  ?  Comment: Reticulocyte Hemoglobin testing ?may be clinically indicated, ?consider ordering this additional ?test [id number] ?  ? HCT 28.6 (L) 39.0 - 52.0 %  ? MCV 74.1 (L) 80.0 - 100.0 fL  ? MCH 23.1 (L) 2

## 2021-07-05 NOTE — Progress Notes (Signed)
SLP Cancellation Note ? ?Patient Details ?Name: DAISHAWN LAUF ?MRN: [id number] ?DOB: [birth year] ? ? ?Cancelled treatment:       Reason Eval/Treat Not Completed: Medical issues which prohibited therapy (Per RN, MD wishes for patient to be NPO until cleared by GI.) ? ?Lubna Stegeman MA, CCC-SLP ? ?Krystl Wickware Meryl ?07/05/2021, 11:41 AM ?

## 2021-07-05 NOTE — Evaluation (Signed)
Physical Therapy Evaluation ?Patient Details ?Name: Matthew Knight ?MRN: [id number] ?DOB: 29-Nov-1940 ?Today's Date: 07/05/2021 ? ?History of Present Illness ? The pt is an 81 yo male presenting 4/29 after sustaining an unwitnessed fall (presumed syncopal episode) ~30 min after being d/c from [facility] ED on 4/29. Pt presented to [facility] ED 4/26-4/29 for GI bleed with hemoglobin to low of 7.6. Plan for capsule endoscopy 5/1. PMH includes: dementia, DM II, hearing loss, and stroke ?  ?Clinical Impression ? Pt in bed upon arrival of PT, agreeable to evaluation at this time. The pt is a poor historian and no family is present at time of evaluation, but per chart, the pt is from [facility] independent living where he has aides through [place] to assist with ADLs. The pt now presents with limitations in functional mobility, endurance, dynamic stability, safety awareness, and activity tolerance due to above dx, and will continue to benefit from skilled PT to address these deficits. The pt was able to complete transitions well without significant assist, but did have slow but constant drop in BP with each change in position with lowest BP after hallway ambulation. (From 144/67 in standing to 119/68 after walking 75 ft). The pt denied sx at all times, will benefit from continued skilled PT acutely to progress endurance and stability. Given recent events and changes in BP with position changes, would recommend short stint of increased supervision/assistance for OOB mobility, if this is unable to be arranged, would need SNF for continued rehab and increased availability of assistance.  ?   ? VITALS:  ?- supine in bed- BP: 137/77 (95); HR: 86bpm ?- sitting EOB - BP: 160/71 (94); HR: 98bpm ?- standing - BP: 144/67 (88); HR: 100bpm ?- standing after 3 min - BP: 124/68 (87); HR: 109bpm ?- standing post ambulation - BP: 119/68 (80); HR: 125bpm ?- sitting EOB - 130/70 (88); HR: 105bpm ? ?  ? ?Recommendations for follow up therapy are one  component of a multi-disciplinary discharge planning process, led by the attending physician.  Recommendations may be updated based on patient status, additional functional criteria and insurance authorization. ? ?Follow Up Recommendations Home health PT (vs SNF if assistance is not available) ? ?  ?Assistance Recommended at Discharge Frequent or constant Supervision/Assistance  ?Patient can return home with the following ? A little help with walking and/or transfers;A little help with bathing/dressing/bathroom;Direct supervision/assist for medications management;Direct supervision/assist for financial management;Assist for transportation;Help with stairs or ramp for entrance;Assistance with cooking/housework ? ?  ?Equipment Recommendations Rolling walker (2 wheels)  ?Recommendations for Other Services ?    ?  ?Functional Status Assessment Patient has had a recent decline in their functional status and demonstrates the ability to make significant improvements in function in a reasonable and predictable amount of time.  ? ?  ?Precautions / Restrictions Precautions ?Precautions: Fall ?Precaution Comments: pt with multiple recent falls, watch orthostatics ?Restrictions ?Weight Bearing Restrictions: No  ? ?  ? ?Mobility ? Bed Mobility ?Overal bed mobility: Independent ?  ?  ?  ?  ?  ?  ?General bed mobility comments: no assist, but repeated cues to complete the task ?  ? ?Transfers ?Overall transfer level: Needs assistance ?Equipment used: Rolling walker (2 wheels) ?Transfers: Sit to/from Stand ?Sit to Stand: Min guard ?  ?  ?  ?  ?  ?General transfer comment: minG for safety, pt able to maintain static stance without UE support ?  ? ?Ambulation/Gait ?Ambulation/Gait assistance: Min guard ?Gait Distance (  Feet): 75 Feet ?Assistive device: Rolling walker (2 wheels), None ?Gait Pattern/deviations: Step-through pattern ?Gait velocity: decreased ?Gait velocity interpretation: 1.31 - 2.62 ft/sec, indicative of limited  community ambulator ?  ?General Gait Details: able to manage with RW with good stability, HR to 125bpm with gait, upon return to room BP with continued drop. pt denies sx ? ?  ? ?Balance Overall balance assessment: Needs assistance ?Sitting-balance support: No upper extremity supported, Feet supported ?Sitting balance-Leahy Scale: Good ?  ?  ?Standing balance support: Bilateral upper extremity supported, During functional activity ?Standing balance-Leahy Scale: Fair ?Standing balance comment: able to static stand without assist, BUEsupport for gait ?  ?  ?  ?  ?  ?  ?  ?  ?  ?  ?  ?   ? ? ? ?Pertinent Vitals/Pain Pain Assessment ?Pain Assessment: No/denies pain  ? ? ?Home Living Family/patient expects to be discharged to:: Private residence ?Living Arrangements: Alone ?Available Help at Discharge: Family;Available PRN/intermittently ?  ?  ?  ?  ?  ?  ?  ?Additional Comments: pt from [facility] independent living, has aides from [place] that come to assist with ADLs, pt reports he lives with his wife and dog in a house, not independent living  ?  ?Prior Function Prior Level of Function : Patient poor historian/Family not available ?  ?  ?  ?  ?  ?  ?Mobility Comments: States he ambulated without assistance ?ADLs Comments: pt states no assist needed, per chart has aides through [place] that come to assist with ADLs ?  ? ? ?Hand Dominance  ? Dominant Hand: Right ? ?  ?Extremity/Trunk Assessment  ? Upper Extremity Assessment ?Upper Extremity Assessment: Overall WFL for tasks assessed ?  ? ?Lower Extremity Assessment ?Lower Extremity Assessment: Overall WFL for tasks assessed ?  ? ?Cervical / Trunk Assessment ?Cervical / Trunk Assessment: Normal  ?Communication  ? Communication: HOH  ?Cognition Arousal/Alertness: Awake/alert ?Behavior During Therapy: [facility] for tasks assessed/performed ?Overall Cognitive Status: History of cognitive impairments - at baseline ?  ?  ?  ?  ?  ?  ?  ?  ?  ?  ?  ?  ?  ?  ?  ?  ?General Comments: pt  with known dementia, poor short term memory in session but able to follow simple cues. very HOH ?  ?  ? ?  ?General Comments General comments (skin integrity, edema, etc.): BP in supine: 137/77 (95), BP in sitting: 160/71 (94); BP in standing: 144/67 (88); BP standing after 3 min: 124/68 (87); BP after ambulation: 119/68 (80) ? ?  ?   ? ?Assessment/Plan  ?  ?PT Assessment Patient needs continued PT services  ?PT Problem List Decreased balance;Decreased strength;Decreased mobility;Decreased cognition ? ?   ?  ?PT Treatment Interventions DME instruction;Gait training;Functional mobility training;Therapeutic exercise;Therapeutic activities;Balance training   ? ?PT Goals (Current goals can be found in the Care Plan section)  ?Acute Rehab PT Goals ?Patient Stated Goal: to get home to his dog ?PT Goal Formulation: With patient ?Time For Goal Achievement: 07/19/21 ?Potential to Achieve Goals: Good ? ?  ?Frequency Min 3X/week ?  ? ? ?   ?AM-PAC PT "6 Clicks" Mobility  ?Outcome Measure Help needed turning from your back to your side while in a flat bed without using bedrails?: None ?Help needed moving from lying on your back to sitting on the side of a flat bed without using bedrails?: A Little ?Help needed moving to and  from a bed to a chair (including a wheelchair)?: A Little ?Help needed standing up from a chair using your arms (e.g., wheelchair or bedside chair)?: A Little ?Help needed to walk in hospital room?: A Little ?Help needed climbing 3-5 steps with a railing? : A Little ?6 Click Score: 19 ? ?  ?End of Session Equipment Utilized During Treatment: Gait belt ?Activity Tolerance: Patient tolerated treatment well ?Patient left: in chair;with call bell/phone within reach;with chair alarm set ?Nurse Communication: Mobility status ?PT Visit Diagnosis: Unsteadiness on feet (R26.81);Muscle weakness (generalized) (M62.81) ?  ? ?Time: 1580-6386 ?PT Time Calculation (min) (ACUTE ONLY): 27 min ? ? ?Charges:   PT  Evaluation ?$PT Eval Low Complexity: 1 Low ?PT Treatments ?$Therapeutic Exercise: 8-22 mins ?  ?   ? ? ?[place] Carbo, PT, DPT  ? ?Acute Rehabilitation Department ?Pager #: [phone] - [phone] ? ?Sandra Cockayne ?07/05/2021, 2:10 PM

## 2021-07-05 NOTE — Consult Note (Signed)
Referring Provider: Dr. Roel Cluck ?Primary Care Physician:  [name], MD ?Primary Gastroenterologist:  Althia Forts ? ?Reason for Consultation:  Anemia ? ?HPI: Matthew Knight is a 81 y.o. male recently admitted last week and discharged yesterday for a GI bleed with a bloody stool and it was managed conservatively and he was discharged yesterday to his assisted living. Sent back last evening following a fall and his Hgb noted to be 8.4 (9.2 at discharge). He had a GI bleed back in January and had a colonoscopy/EGD by Dr. 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 EGD showed gastritis. His Plavix was held at that time and not restarted. His daughter reports he was put back on Aspirin recently. No history of rectal bleeding or black stools. Patient unable to give me any history. He is very hard of hearing but able to tell me his DOB, place, and name. Spoke with his daughter by phone. ? ?Past Medical History:  ?Diagnosis Date  ? Dementia ([place])   ? Diabetes mellitus without complication ([place])   ? Hearing loss   ? Hepatitis   ? Small vessel disease ([place])   ? Stroke [facility])   ? ? ?Past Surgical History:  ?Procedure Laterality Date  ? APPENDECTOMY    ? COLONOSCOPY WITH PROPOFOL N/A 03/24/2021  ? Procedure: COLONOSCOPY WITH PROPOFOL;  Surgeon: Mauri Pole, MD;  Location: [place] ENDOSCOPY;  Service: Endoscopy;  Laterality: N/A;  ? ESOPHAGOGASTRODUODENOSCOPY (EGD) WITH PROPOFOL N/A 03/24/2021  ? Procedure: ESOPHAGOGASTRODUODENOSCOPY (EGD) WITH PROPOFOL;  Surgeon: Mauri Pole, MD;  Location: [place] ENDOSCOPY;  Service: Endoscopy;  Laterality: N/A;  ? HEMOSTASIS CLIP PLACEMENT  03/24/2021  ? Procedure: HEMOSTASIS CLIP PLACEMENT;  Surgeon: Mauri Pole, MD;  Location: [place];  Service: Endoscopy;;  ? HOT HEMOSTASIS N/A 03/24/2021  ? Procedure: HOT HEMOSTASIS (ARGON PLASMA COAGULATION/BICAP);  Surgeon: Mauri Pole, MD;   Location: [facility] ENDOSCOPY;  Service: Endoscopy;  Laterality: N/A;  ? POLYPECTOMY  03/24/2021  ? Procedure: POLYPECTOMY;  Surgeon: Mauri Pole, MD;  Location: [facility] ENDOSCOPY;  Service: Endoscopy;;  ? ? ?Prior to Admission medications   ?Medication Sig Start Date End Date Taking? Authorizing Provider  ?acetaminophen (TYLENOL) 325 MG tablet Take 2 tablets (650 mg total) by mouth every 4 (four) hours as needed for mild pain, moderate pain, fever or headache. 03/11/21  Yes [name], MD  ?escitalopram (LEXAPRO) 20 MG tablet Take 20 mg by mouth daily. 07/11/17  Yes [provider]  ?folic acid (FOLVITE) 629 MCG tablet Take 400 mcg by mouth daily.   Yes [provider]  ?isosorbide mononitrate (IMDUR) 30 MG 24 hr tablet Take 30 mg by mouth daily. 04/28/20  Yes [provider]  ?latanoprost (XALATAN) 0.005 % ophthalmic solution Place 1 drop into both eyes at bedtime.  07/27/17  Yes [provider]  ?lisinopril (ZESTRIL) 10 MG tablet Take 10 mg by mouth daily. 02/01/20  Yes [provider]  ?meclizine (ANTIVERT) 25 MG tablet Take 25 mg by mouth daily.   Yes [provider]  ?omeprazole (PRILOSEC) 40 MG capsule Take 1 capsule (40 mg total) by mouth daily. 03/25/21 07/04/21 Yes Pahwani, Einar Grad, MD  ?polyethylene glycol (MIRALAX) 17 g packet Take 17 g by mouth daily as needed for moderate constipation. 07/04/21  Yes [name], MD  ?pravastatin (PRAVACHOL) 20 MG tablet Take 20 mg by mouth daily. 04/28/20  Yes [provider]  ?RHOPRESSA 0.02 %  SOLN Place 1 drop into both eyes daily. 11/15/18  Yes [provider]  ?silodosin (RAPAFLO) 8 MG CAPS capsule Take 8 mg by mouth daily. 06/13/21  Yes [provider]  ? ? ?Scheduled Meds: ? docusate sodium  100 mg Oral BID  ? escitalopram  20 mg Oral Daily  ? latanoprost  1 drop Both Eyes QHS  ? meclizine  25 mg Oral Daily  ? pantoprazole  40 mg Oral Daily  ? pravastatin  20 mg Oral q1800  ? ?Continuous Infusions: ?  sodium chloride 75 mL/hr (07/05/21 0410)  ? ?PRN Meds:.acetaminophen **OR** acetaminophen, HYDROcodone-acetaminophen ? ?Allergies as of 07/04/2021 - Review Complete 07/04/2021  ?Allergen Reaction Noted  ? Darunavir Other (See Comments) 09/19/2017  ? Exelon [rivastigmine] Other (See Comments) 07/01/2021  ? Sulfa antibiotics  04/23/2015  ? ? ?Family History  ?Problem Relation Age of Onset  ? Diabetes Mother   ? Heart disease Father   ? Memory loss Paternal Grandfather   ? ? ?Social History  ? ?Socioeconomic History  ? Marital status: Married  ?  Spouse name: Not on file  ? Number of children: Not on file  ? Years of education: Not on file  ? Highest education level: Not on file  ?Occupational History  ? Not on file  ?Tobacco Use  ? Smoking status: Every Day  ?  Types: Pipe  ? Smokeless tobacco: Never  ?Vaping Use  ? Vaping Use: Never used  ?Substance and Sexual Activity  ? Alcohol use: No  ?  Alcohol/week: 0.0 standard drinks  ? Drug use: No  ? Sexual activity: Not on file  ?Other Topics Concern  ? Not on file  ?Social History Narrative  ? Not on file  ? ?Social Determinants of Health  ? ?Financial Resource Strain: Not on file  ?Food Insecurity: Not on file  ?Transportation Needs: Not on file  ?Physical Activity: Not on file  ?Stress: Not on file  ?Social Connections: Not on file  ?Intimate Partner Violence: Not on file  ? ? ?Review of Systems: All negative except as stated above in HPI. ? ?Physical Exam: ?Vital signs: ?Vitals:  ? 07/05/21 0800 07/05/21 1047  ?BP: (!) 117/48 134/62  ?Pulse: (!) 59 61  ?Resp:  17  ?Temp:  98.3 ?F (36.8 ?C)  ?SpO2: 97% 96%  ? ?  ?General:   Lethargic, hard of hearing, oriented X 3, well-nourished, no acute distress, pleasant  ?Head: normocephalic, atraumatic ?Eyes: anicteric sclera ?ENT: oropharynx clear ?Neck: supple, nontender ?Lungs:  Clear throughout to auscultation.   No wheezes, crackles, or rhonchi. No acute distress. ?Heart:  Regular rate and rhythm; no murmurs, clicks, rubs,   or gallops. ?Abdomen: epigastric tenderness with guarding, soft, nondistended, +BS  ?Rectal:  Deferred ?Ext: no edema ? ?GI:  ?Lab Results: ?Recent Labs  ?  07/05/21 ?0122 07/05/21 ?1478 07/05/21 ?1051  ?WBC 9.5 8.9 8.6  ?HGB 8.9* 7.8* 8.4*  ?HCT 28.6* 25.8* 27.1*  ?PLT 403* 417* 431*  ? ?BMET ?Recent Labs  ?  07/03/21 ?0741 07/04/21 ?2956 07/05/21 ?0428  ?NA 141 140 137  ?K 3.7 3.6 3.5  ?CL 111 105 106  ?CO2 '24 27 26  '$ ?GLUCOSE 91 122* 103*  ?BUN '10 9 10  '$ ?CREATININE 1.08 1.21 1.14  ?CALCIUM 8.8* 9.1 8.4*  ? ?LFT ?Recent Labs  ?  07/05/21 ?0122 07/05/21 ?2130  ?PROT 6.3* 5.6*  ?ALBUMIN 3.3* 3.0*  ?AST 16 14*  ?ALT 14 14  ?ALKPHOS 57 50  ?BILITOT  0.4 0.5  ?BILIDIR 0.1  --   ?IBILI 0.3  --   ? ?PT/INR ?Recent Labs  ?  07/05/21 ?0122  ?LABPROT 13.5  ?INR 1.0  ? ? ? ?Studies/Results: ?CT Head Wo Contrast ? ?Result Date: 07/04/2021 ?CLINICAL DATA:  Un witnessed fall with headaches and neck pain, initial encounter EXAM: CT HEAD WITHOUT CONTRAST CT CERVICAL SPINE WITHOUT CONTRAST TECHNIQUE: Multidetector CT imaging of the head and cervical spine was performed following the standard protocol without intravenous contrast. Multiplanar CT image reconstructions of the cervical spine were also generated. RADIATION DOSE REDUCTION: This exam was performed according to the departmental dose-optimization program which includes automated exposure control, adjustment of the mA and/or kV according to patient size and/or use of iterative reconstruction technique. COMPARISON:  03/19/2021 FINDINGS: CT HEAD FINDINGS Brain: No evidence of acute infarction, hemorrhage, hydrocephalus, extra-axial collection or mass lesion/mass effect. Mild atrophic changes and chronic white matter ischemic changes are noted similar to that seen on prior exam. Vascular: No hyperdense vessel or unexpected calcification. Skull: Normal. Negative for fracture or focal lesion. Sinuses/Orbits: No acute finding. Other: None. CT CERVICAL SPINE FINDINGS Alignment:  Stable loss of the normal cervical lordosis is seen. Mild anterolisthesis of C4 on C5 and C5 on C6 is noted. Skull base and vertebrae: Multilevel osteophytic changes are seen. No acute fracture or acute facet abn

## 2021-07-05 NOTE — Progress Notes (Signed)
PROGRESS NOTE    Matthew Knight  ONG:[id number] DOB: 01-Apr-1940 DOA: 07/04/2021 PCP: Tracey Harries, MD    Brief Narrative:   Matthew Knight is an 81 year old male with past medical history significant for dementia, history of TIA, HTN, HLD, BPH, anxiety/depression, colonic AVMs who presented to [facility] ED on 4/29 following unwitnessed fall after return to SNF.  Patient was complaining of left-sided facial pain and left rib pain.  EMS was activated and brought to the ED for further evaluation.  Patient was recently discharged same day from [facility] long hospital in which he was hospitalized from 4 12/26 through 4/29 for GI bleeding that was treated conservatively.  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  Treated conservatively with receiving 1 unit PRBC.  Hemoglobin at time of discharge was 9.2.  On ED arrival same day in the evening was 8.4.  FOBT positive.  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 --[facility] gastroenterology following, appreciate assistance --Hgb 8.4>8.9>7.8>8.4 --Undergoing video capsule endoscopy today  Unwitnessed fall From [facility] independent living. --PT/OT evaluation  Essential hypertension --Lisinopril 10 mg p.o. daily --Hold home Imdur 30 mg p.o. daily  Hyperlipidemia --Pravastatin 20 mg p.o. daily  Anxiety/depression: --Lexapro 20 mg p.o. daily  BPH On Rapaflo at home. --Tamsulosin 0.4 mg p.o. daily  GERD --Protonix 40 mg p.o. daily  History of TIA Had been previously on aspirin and Plavix but has been discontinued due to recurrent GI bleed episodes.   DVT prophylaxis: SCDs Start: 07/05/21 0315    Code Status: DNR Family Communication: No family present at bedside this morning  Disposition Plan:  Level of care:  Telemetry Medical Status is: Observation The patient remains OBS appropriate and will d/c before 2 midnights.    Consultants:  [facility] gastroenterology  Procedures:  Capsule endoscopy: Pending  Antimicrobials:  None   Subjective: Patient seen examined bedside, resting comfortably.  Sleeping but easily arousable.  States "I am tired and trying to get rest".  No other specific complaints this morning.  Denies headache, no chest pain, no shortness of breath, no abdominal pain.  No acute events overnight per nurse staff.  Objective: Vitals:   07/05/21 0200 07/05/21 0506 07/05/21 0800 07/05/21 1047  BP: (!) 118/56 (!) 133/49 (!) 117/48 134/62  Pulse: 85 72 (!) 59 61  Resp:  16  17  Temp:  97.9 F (36.6 C)  98.3 F (36.8 C)  TempSrc:  Oral  Oral  SpO2: 94% 96% 97% 96%  Weight:    95.4 kg  Height:    6' (1.829 m)   No intake or output data in the 24 hours ending 07/05/21 1345 Filed Weights   07/05/21 1047  Weight: 95.4 kg    Examination:  Physical Exam: GEN: NAD, alert, wd/wn HEENT: NCAT, PERRL, EOMI, sclera clear, MMM PULM: CTAB w/o wheezes/crackles, normal respiratory effort, on room air CV: RRR w/o M/G/R GI: abd soft, NTND, NABS, no R/G/M MSK: no peripheral edema, muscle strength globally intact 5/5 bilateral upper/lower extremities NEURO: CN II-XII intact, no focal deficits, sensation to light touch intact PSYCH: normal mood/affect Integumentary: dry/intact, no rashes or wounds    Data Reviewed: I have personally reviewed following labs and imaging studies  CBC: Recent Labs  Lab 07/03/21 0741 07/04/21 0920 07/04/21 1813 07/05/21 0122  07/05/21 0428 07/05/21 1051  WBC 8.8 10.2 9.1 9.5 8.9 8.6  NEUTROABS QUANTITY NOT SUFFICIENT, UNABLE TO PERFORM TEST  --   --   --   --   --   HGB 9.3* 9.2* 8.4* 8.9* 7.8* 8.4*  HCT 31.3* 29.9* 27.4* 28.6* 25.8* 27.1*  MCV 75.8* 75.1* 74.5* 74.1* 75.0* 73.6*  PLT 403* 430* 411* 403* 417* 431*   Basic Metabolic  Panel: Recent Labs  Lab 07/01/21 1159 07/02/21 0007 07/03/21 0741 07/04/21 1813 07/05/21 0122 07/05/21 0428  NA 141 139 141 140  --  137  K 3.8 3.4* 3.7 3.6  --  3.5  CL 111 110 111 105  --  106  CO2 23 23 24 27   --  26  GLUCOSE 109* 105* 91 122*  --  103*  BUN 14 14 10 9   --  10  CREATININE 0.99 1.12 1.08 1.21  --  1.14  CALCIUM 8.9 8.7* 8.8* 9.1  --  8.4*  MG  --   --   --   --  2.1  --   PHOS  --   --   --   --  4.2  --    GFR: Estimated Creatinine Clearance: 61.9 mL/min (by C-G formula based on SCr of 1.14 mg/dL). Liver Function Tests: Recent Labs  Lab 07/01/21 1159 07/02/21 0007 07/05/21 0122 07/05/21 0428  AST 18 17 16  14*  ALT 16 15 14 14   ALKPHOS 66 62 57 50  BILITOT 0.6 0.6 0.4 0.5  PROT 6.9 6.6 6.3* 5.6*  ALBUMIN 3.7 3.5 3.3* 3.0*   No results for input(s): LIPASE, AMYLASE in the last 168 hours. No results for input(s): AMMONIA in the last 168 hours. Coagulation Profile: Recent Labs  Lab 07/01/21 1159 07/05/21 0122  INR 1.1 1.0   Cardiac Enzymes: Recent Labs  Lab 07/05/21 0122  CKTOTAL 58   BNP (last 3 results) No results for input(s): PROBNP in the last 8760 hours. HbA1C: No results for input(s): HGBA1C in the last 72 hours. CBG: No results for input(s): GLUCAP in the last 168 hours. Lipid Profile: No results for input(s): CHOL, HDL, LDLCALC, TRIG, CHOLHDL, LDLDIRECT in the last 72 hours. Thyroid Function Tests: Recent Labs    07/05/21 0122  TSH 0.850   Anemia Panel: Recent Labs    07/05/21 1051  VITAMINB12 472  FOLATE 21.8  FERRITIN 4*  TIBC 511*  IRON 13*   Sepsis Labs: No results for input(s): PROCALCITON, LATICACIDVEN in the last 168 hours.  No results found for this or any previous visit (from the past 240 hour(s)).       Radiology Studies: CT Head Wo Contrast  Result Date: 07/04/2021 CLINICAL DATA:  Un witnessed fall with headaches and neck pain, initial encounter EXAM: CT HEAD WITHOUT CONTRAST CT CERVICAL SPINE  WITHOUT CONTRAST TECHNIQUE: Multidetector CT imaging of the head and cervical spine was performed following the standard protocol without intravenous contrast. Multiplanar CT image reconstructions of the cervical spine were also generated. RADIATION DOSE REDUCTION: This exam was performed according to the departmental dose-optimization program which includes automated exposure control, adjustment of the mA and/or kV according to patient size and/or use of iterative reconstruction technique. COMPARISON:  03/19/2021 FINDINGS: CT HEAD FINDINGS Brain: No evidence of acute infarction, hemorrhage, hydrocephalus, extra-axial collection or mass lesion/mass effect. Mild atrophic changes and chronic white matter ischemic changes are noted similar to that seen on prior exam. Vascular: No hyperdense vessel or unexpected calcification. Skull: Normal.  Negative for fracture or focal lesion. Sinuses/Orbits: No acute finding. Other: None. CT CERVICAL SPINE FINDINGS Alignment: Stable loss of the normal cervical lordosis is seen. Mild anterolisthesis of C4 on C5 and C5 on C6 is noted. Skull base and vertebrae: Multilevel osteophytic changes are seen. No acute fracture or acute facet abnormality is noted. Multilevel facet hypertrophic changes are seen. The odontoid is within normal limits. Soft tissues and spinal canal: Surrounding soft tissue structures demonstrate bilateral thyroid hypodensities better visualized than on the prior exam. The largest of these lies on the left measuring up to 19 mm. Upper chest: Visualized lung apices are within normal limits. Other: None IMPRESSION: CT of the head: Chronic atrophic and ischemic changes without acute abnormality. CT of the cervical spine: Multilevel degenerative change without acute abnormality. Bilateral hypodense nodules within the thyroid measuring up to 19 mm. Recommend nonemergent thyroid US (ref: J Am Coll Radiol. 2015 Feb;12(2): 143-50). Electronically Signed   By: Alcide Clever  M.D.   On: 07/04/2021 23:05   CT Cervical Spine Wo Contrast  Result Date: 07/04/2021 CLINICAL DATA:  Un witnessed fall with headaches and neck pain, initial encounter EXAM: CT HEAD WITHOUT CONTRAST CT CERVICAL SPINE WITHOUT CONTRAST TECHNIQUE: Multidetector CT imaging of the head and cervical spine was performed following the standard protocol without intravenous contrast. Multiplanar CT image reconstructions of the cervical spine were also generated. RADIATION DOSE REDUCTION: This exam was performed according to the departmental dose-optimization program which includes automated exposure control, adjustment of the mA and/or kV according to patient size and/or use of iterative reconstruction technique. COMPARISON:  03/19/2021 FINDINGS: CT HEAD FINDINGS Brain: No evidence of acute infarction, hemorrhage, hydrocephalus, extra-axial collection or mass lesion/mass effect. Mild atrophic changes and chronic white matter ischemic changes are noted similar to that seen on prior exam. Vascular: No hyperdense vessel or unexpected calcification. Skull: Normal. Negative for fracture or focal lesion. Sinuses/Orbits: No acute finding. Other: None. CT CERVICAL SPINE FINDINGS Alignment: Stable loss of the normal cervical lordosis is seen. Mild anterolisthesis of C4 on C5 and C5 on C6 is noted. Skull base and vertebrae: Multilevel osteophytic changes are seen. No acute fracture or acute facet abnormality is noted. Multilevel facet hypertrophic changes are seen. The odontoid is within normal limits. Soft tissues and spinal canal: Surrounding soft tissue structures demonstrate bilateral thyroid hypodensities better visualized than on the prior exam. The largest of these lies on the left measuring up to 19 mm. Upper chest: Visualized lung apices are within normal limits. Other: None IMPRESSION: CT of the head: Chronic atrophic and ischemic changes without acute abnormality. CT of the cervical spine: Multilevel degenerative change  without acute abnormality. Bilateral hypodense nodules within the thyroid measuring up to 19 mm. Recommend nonemergent thyroid US (ref: J Am Coll Radiol. 2015 Feb;12(2): 143-50). Electronically Signed   By: Alcide Clever M.D.   On: 07/04/2021 23:05   DG CHEST PORT 1 VIEW  Result Date: 07/05/2021 CLINICAL DATA:  Unwitnessed fall. EXAM: PORTABLE CHEST 1 VIEW COMPARISON:  None. FINDINGS: The heart size and mediastinal contours are stable. Atherosclerotic calcification of the aorta is noted. No consolidation, effusion, or pneumothorax. Stable calcified pleural plaques are noted bilaterally. Right shoulder fixation hardware is noted. IMPRESSION: Stable chest with no acute process. Electronically Signed   By: Thornell Sartorius M.D.   On: 07/05/2021 00:57        Scheduled Meds:  docusate sodium  100 mg Oral BID   escitalopram  20 mg Oral Daily  latanoprost  1 drop Both Eyes QHS   meclizine  25 mg Oral Daily   pantoprazole  40 mg Oral Daily   pravastatin  20 mg Oral q1800   Continuous Infusions:   LOS: 0 days    Time spent: 52 minutes spent on chart review, discussion with nursing staff, consultants, updating family and interview/physical exam; more than 50% of that time was spent in counseling and/or coordination of care.    Alvira Philips [place], DO [facility] Available via Epic secure chat 7am-7pm After these hours, please refer to coverage provider listed on [email or web address] 07/05/2021, 1:45 PM

## 2021-07-06 ENCOUNTER — Encounter (HOSPITAL_COMMUNITY): Payer: Self-pay | Admitting: Gastroenterology

## 2021-07-06 DIAGNOSIS — D649 Anemia, unspecified: Secondary | ICD-10-CM | POA: Diagnosis not present

## 2021-07-06 LAB — BASIC METABOLIC PANEL
Anion gap: 8 (ref 5–15)
BUN: 10 mg/dL (ref 8–23)
CO2: 25 mmol/L (ref 22–32)
Calcium: 8.6 mg/dL — ABNORMAL LOW (ref 8.9–10.3)
Chloride: 105 mmol/L (ref 98–111)
Creatinine, Ser: 1.24 mg/dL (ref 0.61–1.24)
GFR, Estimated: 59 mL/min — ABNORMAL LOW (ref >60)
Glucose, Bld: 102 mg/dL — ABNORMAL HIGH (ref 70–99)
Potassium: 3.5 mmol/L (ref 3.5–5.1)
Sodium: 138 mmol/L (ref 135–145)

## 2021-07-06 LAB — CBC
HCT: 25.8 % — ABNORMAL LOW (ref 39.0–52.0)
Hemoglobin: 8 g/dL — ABNORMAL LOW (ref 13.0–17.0)
MCH: 23 pg — ABNORMAL LOW (ref 26.0–34.0)
MCHC: 31 g/dL (ref 30.0–36.0)
MCV: 74.1 fL — ABNORMAL LOW (ref 80.0–100.0)
Platelets: 399 10*3/uL (ref 150–400)
RBC: 3.48 MIL/uL — ABNORMAL LOW (ref 4.22–5.81)
RDW: 17.8 % — ABNORMAL HIGH (ref 11.5–15.5)
WBC: 7.8 10*3/uL (ref 4.0–10.5)
nRBC: 0 % (ref 0.0–0.2)

## 2021-07-06 LAB — HEMOGLOBIN AND HEMATOCRIT, BLOOD
HCT: 27.6 % — ABNORMAL LOW (ref 39.0–52.0)
Hemoglobin: 8.4 g/dL — ABNORMAL LOW (ref 13.0–17.0)

## 2021-07-06 LAB — MAGNESIUM: Magnesium: 2.1 mg/dL (ref 1.7–2.4)

## 2021-07-06 MED ORDER — SODIUM CHLORIDE 0.9 % IV SOLN: INTRAVENOUS | Status: AC

## 2021-07-06 MED ORDER — SODIUM CHLORIDE 0.9 % IV SOLN
250.0000 mg | Freq: Every day | INTRAVENOUS | Status: AC
  Administered 2021-07-06 – 2021-07-09 (×4): 250 mg via INTRAVENOUS
  Filled 2021-07-06 (×4): qty 20

## 2021-07-06 MED ORDER — POLYETHYLENE GLYCOL 3350 17 G PO PACK
17.0000 g | PACK | Freq: Once | ORAL | Status: AC
Start: 2021-07-06 — End: 2021-07-06
  Administered 2021-07-06: 17 g via ORAL

## 2021-07-06 MED ORDER — ADULT MULTIVITAMIN W/MINERALS CH
1.0000 | ORAL_TABLET | Freq: Every day | ORAL | Status: DC
  Administered 2021-07-06 – 2021-07-11 (×6): 1 via ORAL
  Filled 2021-07-06 (×6): qty 1

## 2021-07-06 MED ORDER — BISACODYL 5 MG PO TBEC
10.0000 mg | DELAYED_RELEASE_TABLET | Freq: Once | ORAL | Status: AC
  Administered 2021-07-06: 10 mg via ORAL

## 2021-07-06 MED ORDER — SODIUM CHLORIDE 0.9 % IV BOLUS
1000.0000 mL | Freq: Once | INTRAVENOUS | Status: AC
Start: 2021-07-06 — End: 2021-07-07
  Administered 2021-07-06: 1000 mL via INTRAVENOUS

## 2021-07-06 MED ORDER — BOOST / RESOURCE BREEZE PO LIQD CUSTOM
1.0000 | Freq: Three times a day (TID) | ORAL | Status: DC
  Administered 2021-07-06 – 2021-07-11 (×11): 1 via ORAL

## 2021-07-06 MED ORDER — SODIUM CHLORIDE 0.9 % IV SOLN: INTRAVENOUS | Status: DC

## 2021-07-06 NOTE — Evaluation (Signed)
Occupational Therapy Evaluation ?Patient Details ?Name: Matthew Knight ?MRN: [id number] ?DOB: [birth year] ?Today's Date: 07/06/2021 ? ? ?History of Present Illness The pt is an 81 yo male presenting 4/29 after sustaining an unwitnessed fall (presumed syncopal episode) ~30 min after being d/c from [facility] ED on 4/29. Pt presented to [facility] ED 4/26-4/29 for GI bleed with hemoglobin to low of 7.6. Plan for capsule endoscopy 5/1. PMH includes: dementia, DM II, hearing loss, and stroke  ? ?Clinical Impression ?  ?PTA, pt from [place], reports ambulatory without AD though per chart, has had recent falls. Pt poor historian, so unsure of true ADL assist levels. Pt presents with deficits in cognition, dynamic balance, and endurance. Overall, pt requires Supervision for UB ADL, min guard for LB ADL and min guard for short mobility using RW and no AD. Pt denies lightheadedness but noted with BP drop in standing, increasing HR and increasing SOB with activity. Though pt able to complete tasks without much physical assistance, pt's cognitive deficits may be appropriate for ALF transition for med mgmt assist, meal assist and increased ADL support. If increased support unable to be provided at ILF at DC, may need to consider SNF rehab. Will continue to follow acutely and update DC recs as appropriate.  ? ?BP supine: 159/67 HR 80s ?BP sitting EOB: 166/82 ?BP with initial standing: 133/56, HR 100s ?BP during activity malfunction, HR 120s ?BP after return to bed: 152/71  ?   ? ?Recommendations for follow up therapy are one component of a multi-disciplinary discharge planning process, led by the attending physician.  Recommendations may be updated based on patient status, additional functional criteria and insurance authorization.  ? ?Follow Up Recommendations ? Home health OT (vs SNF rehab if increased support unable to be provided. Rec transition to ALF as long term plan due to cognitive deficits)  ?  ?Assistance Recommended at  Discharge Frequent or constant Supervision/Assistance  ?Patient can return home with the following A little help with bathing/dressing/bathroom;Assistance with cooking/housework;Direct supervision/assist for medications management;Direct supervision/assist for financial management;Assist for transportation ? ?  ?Functional Status Assessment ? Patient has had a recent decline in their functional status and demonstrates the ability to make significant improvements in function in a reasonable and predictable amount of time.  ?Equipment Recommendations ? None recommended by OT  ?  ?Recommendations for Other Services   ? ? ?  ?Precautions / Restrictions Precautions ?Precautions: Fall ?Precaution Comments: pt with multiple recent falls, watch orthostatics ?Restrictions ?Weight Bearing Restrictions: No  ? ?  ? ?Mobility Bed Mobility ?Overal bed mobility: Modified Independent ?  ?  ?  ?  ?  ?  ?  ?  ? ?Transfers ?Overall transfer level: Needs assistance ?Equipment used: Rolling walker (2 wheels), None ?Transfers: Sit to/from Stand ?Sit to Stand: Min guard ?  ?  ?  ?  ?  ?General transfer comment: minG for safety, pt able to maintain static stance without UE support ?  ? ?  ?Balance Overall balance assessment: Needs assistance ?Sitting-balance support: No upper extremity supported, Feet supported ?Sitting balance-Leahy Scale: Fair ?Sitting balance - Comments: LOB when donning socks ?  ?Standing balance support: No upper extremity supported, During functional activity ?Standing balance-Leahy Scale: Fair ?Standing balance comment: able to mobilize with RW and without short distances ?  ?  ?  ?  ?  ?  ?  ?  ?  ?  ?  ?   ? ?ADL either performed or assessed with clinical  judgement  ? ?ADL Overall ADL's : Needs assistance/impaired ?Eating/Feeding: Independent;Sitting ?  ?Grooming: Supervision/safety;Standing;Oral care;Brushing hair ?Grooming Details (indicate cue type and reason): able to stand without UE support and reach  overhead to comb hair ?Upper Body Bathing: Supervision/ safety;Sitting ?  ?Lower Body Bathing: Min guard;Sitting/lateral leans;Sit to/from stand ?Lower Body Bathing Details (indicate cue type and reason): urine incontinence, able to perform peri hygiene in standing without UE support ?Upper Body Dressing : Supervision/safety;Sitting ?Upper Body Dressing Details (indicate cue type and reason): cues for line mgmt ?Lower Body Dressing: Min guard;Sit to/from stand;Sitting/lateral leans ?Lower Body Dressing Details (indicate cue type and reason): able to don socks with figure four position though one LOB sititng EOB completing task ?Toilet Transfer: Min guard;Ambulation ?  ?Toileting- Clothing Manipulation and Hygiene: Min guard;Sitting/lateral lean;Sit to/from stand ?  ?  ?  ?Functional mobility during ADLs: Min guard ?General ADL Comments: Once on feet, pt able to ambulate with RW and without AD without LOB though LOB noted with dynamic sitting tasks. Pt with noted cognitive deficits, asking "Where am I?" during session, likely needs increased support/supervision due to cognition rather than physical deficits  ? ? ? ?Vision Baseline Vision/History: 1 Wears glasses ?Ability to See in Adequate Light: 1 Impaired ?Patient Visual Report: No change from baseline ?Vision Assessment?: No apparent visual deficits  ?   ?Perception   ?  ?Praxis   ?  ? ?Pertinent Vitals/Pain Pain Assessment ?Pain Assessment: No/denies pain  ? ? ? ?Hand Dominance Right ?  ?Extremity/Trunk Assessment Upper Extremity Assessment ?Upper Extremity Assessment: Overall WFL for tasks assessed ?  ?Lower Extremity Assessment ?Lower Extremity Assessment: Defer to PT evaluation ?  ?Cervical / Trunk Assessment ?Cervical / Trunk Assessment: Normal ?  ?Communication Communication ?Communication: HOH ?  ?Cognition Arousal/Alertness: Awake/alert ?Behavior During Therapy: [facility] for tasks assessed/performed ?Overall Cognitive Status: History of cognitive impairments -  at baseline ?Area of Impairment: Problem solving, Memory, Orientation, Awareness, Following commands ?  ?  ?  ?  ?  ?  ?  ?  ?Orientation Level: Disoriented to, Place, Time ?  ?Memory: Decreased short-term memory ?Following Commands: Follows one step commands with increased time, Follows one step commands consistently ?  ?Awareness: Emergent ?Problem Solving: Requires verbal cues ?General Comments: pt with known dementia, poor short term memory in session but able to follow simple cues. pt able to recount being given PO capsule for GI intervention but asked "where am I during sesion?" ?  ?  ?General Comments    ? ?  ?Exercises   ?  ?Shoulder Instructions    ? ? ?Home Living Family/patient expects to be discharged to:: Other (Comment) (ILF) ?Living Arrangements: Alone ?Available Help at Discharge: Family;Available PRN/intermittently ?  ?  ?  ?  ?  ?  ?  ?  ?  ?  ?  ?  ?  ?  ?Additional Comments: pt from [facility] independent living, has aides from [place] that come to assist with ADLs. ?  ? ?  ?Prior Functioning/Environment Prior Level of Function : Patient poor historian/Family not available ?  ?  ?  ?  ?  ?  ?Mobility Comments: States he ambulated without assistance and without AD ?ADLs Comments: pt states no assist needed, Per PT eval, has aides through [place] that come to assist with ADLs. reports unsure if he has medication assist. reports he goes to a dining hall for meals or makes simple meals - prefers dining room meals ?  ? ?  ?  ?  OT Problem List: Decreased activity tolerance;Impaired balance (sitting and/or standing);Decreased cognition;Decreased knowledge of use of DME or AE ?  ?   ?OT Treatment/Interventions: Self-care/ADL training;Therapeutic exercise;Energy conservation;DME and/or AE instruction;Therapeutic activities;Patient/family education  ?  ?OT Goals(Current goals can be found in the care plan section) Acute Rehab OT Goals ?Patient Stated Goal: feel better soon, have some breakfast ?OT Goal  Formulation: With patient ?Time For Goal Achievement: 07/20/21 ?Potential to Achieve Goals: Good ?ADL Goals ?Pt Will Transfer to Toilet: with modified independence;ambulating ?Additional ADL Goal #1: Pt to complete pil

## 2021-07-06 NOTE — TOC Initial Note (Signed)
Transition of Care (TOC) - Initial/Assessment Note  ? ? ?Patient Details  ?Name: Matthew Knight ?MRN: [id number] ?Date of Birth: 1941-01-29 ? ?Transition of Care [facility]) CM/SW Contact:    ?Joanne Chars, LCSW ?Phone Number: ?07/06/2021, 11:15 AM ? ?Clinical Narrative:   Pt with dementia, hard of hearing, CSW spoke with him briefly in room.  CSW then spoke with  daughter Seth Bake.  Pt at [facility] independent living with [facility] aides from the [place] in place to assist multiple times each day.  Falls have been more recent issue and, per daughter, have been worse with the bleeding issues. Moved to [facility] from home earlier this year.  VA support is only available if he stays in independent living and stops if he goes to assisted living.   Daughter is open to suggestions for plan moving forward.           ? ? ?Expected Discharge Plan: Home/Self Care ([facility] independent living) ?Barriers to Discharge: Continued Medical Work up ? ? ?Patient Goals and CMS Choice ?  ?  ?  ? ?Expected Discharge Plan and Services ?Expected Discharge Plan: Home/Self Care ([facility] independent living) ?In-house Referral: Clinical Social Work ?  ?Post Acute Care Choice:  (TBD) ?Living arrangements for the past 2 months: [place] ?                ?  ?  ?  ?  ?  ?  ?  ?  ?  ?  ? ?Prior Living Arrangements/Services ?Living arrangements for the past 2 months: [place] ?Lives with:: Self ?Patient language and need for interpreter reviewed:: Yes ?       ?Need for Family Participation in Patient Care: Yes (Comment) ?Care giver support system in place?: Yes (comment) ?Current home services: Homehealth aide (through [place]) ?Criminal Activity/Legal Involvement Pertinent to Current Situation/Hospitalization: No - Comment as needed ? ?Activities of Daily Living ?Home Assistive Devices/Equipment: Hearing aid ?ADL Screening (condition at time of admission) ?Patient's cognitive ability adequate to safely  complete daily activities?: No ?Is the patient deaf or have difficulty hearing?: Yes ?Does the patient have difficulty seeing, even when wearing glasses/contacts?: No ?Does the patient have difficulty concentrating, remembering, or making decisions?: Yes ?Patient able to express need for assistance with ADLs?: Yes ?Does the patient have difficulty dressing or bathing?: No ?Independently performs ADLs?: No ?Communication: Independent ?Dressing (OT): Needs assistance ?Is this a change from baseline?: Pre-admission baseline ?Grooming: Independent ?Feeding: Independent ?Bathing: Needs assistance ?Is this a change from baseline?: Pre-admission baseline ?Toileting: Needs assistance ?Is this a change from baseline?: Pre-admission baseline ?In/Out Bed: Needs assistance ?Is this a change from baseline?: Pre-admission baseline ?Walks in Home: Needs assistance ?Is this a change from baseline?: Pre-admission baseline ?Does the patient have difficulty walking or climbing stairs?: Yes ?Weakness of Legs: Both ?Weakness of Arms/Hands: None ? ?Permission Sought/Granted ?Permission sought to share information with : Family Supports ?Permission granted to share information with : Yes, Verbal Permission Granted ? Share Information with NAME: daughter Seth Bake, son Ovid Curd ?   ?   ?   ? ?Emotional Assessment ?Appearance:: Appears stated age ?Attitude/Demeanor/Rapport: Engaged ?Affect (typically observed): Appropriate ?Orientation: : Oriented to Self, Oriented to Place, Oriented to  Time, Oriented to Situation ?Alcohol / Substance Use: Not Applicable ?Psych Involvement: No (comment) ? ?Admission diagnosis:  Fall, initial encounter [W19.XXXA] ?Symptomatic anemia [D64.9] ?Patient Active Problem List  ? Diagnosis Date Noted  ? Rectal bleeding 07/02/2021  ?  GIB (gastrointestinal bleeding) 07/01/2021  ? BPH (benign prostatic hyperplasia) 07/01/2021  ? HLD (hyperlipidemia) 07/01/2021  ? Iron deficiency anemia due to chronic blood loss   ?  Symptomatic anemia   ? GI bleed 03/19/2021  ? Leukocytosis 03/19/2021  ? Renal insufficiency 03/19/2021  ? Fall at home, initial encounter 03/19/2021  ? Heme positive stool   ? Acute blood loss anemia   ? Platelet inhibition due to Plavix   ? Orthostatic hypotension 03/10/2021  ? Acute left-sided weakness 03/09/2021  ? Closed fracture distal radius and ulna, left, sequela 03/09/2021  ? History of TIA (transient ischemic attack) 03/09/2021  ? Abnormal ECG 04/28/2020  ? Snoring 04/28/2020  ? Anxiety 11/22/2018  ? Dementia without behavioral disturbance ([place]) 11/22/2018  ? Depression 11/22/2018  ? Exposure to [facility] 11/22/2018  ? Small vessel disease, 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  ? Dizziness 10/24/2018  ? Benign essential HTN 11/11/2017  ? Bradycardia 11/11/2017  ? Near syncope 11/10/2017  ? Onychomycosis 04/19/2017  ? Memory loss 01/22/2017  ? 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  ? 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 Hypertension, benign 03/28/2015  ? Screening for colon cancer 12/03/2013  ? 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 Tobacco use disorder 07/17/2012  ? Vitamin D deficiency 01/14/2012  ? Sudden visual loss 08/17/2010  ? Elevated prostate specific antigen (PSA) 01/07/2010  ? Chest pain 02/10/2009  ? Skin sensation disturbance 02/10/2009  ? Hepatitis 01/23/2009  ? ?PCP:  [name], MD ?Pharmacy:   ?[facility] - [address] ?[place] ?[place] [address] ?Phone: [phone] Fax: [phone] ? ? ? ? ?Social Determinants of Health (SDOH) Interventions ?  ? ?Readmission Risk Interventions ?   ? View : No data to display.  ?  ?  ?  ? ? ? ?

## 2021-07-06 NOTE — Plan of Care (Signed)
  Problem: Education: Goal: Knowledge of General Education information will improve Description Including pain rating scale, medication(s)/side effects and non-pharmacologic comfort measures Outcome: Progressing   

## 2021-07-06 NOTE — Progress Notes (Addendum)
[facility] Progress Note ? ?Matthew Knight 81 y.o. [birth year] ? ? ?Subjective: ?Feels ok. Denies abdominal pain. ? ?Objective: ?Vital signs: ?Vitals:  ? 07/06/21 0458 07/06/21 0736  ?BP: (!) 133/55 (!) 153/76  ?Pulse: 67 64  ?Resp: 13 14  ?Temp: 98.5 ?F (36.9 ?C) 98.6 ?F (37 ?C)  ?SpO2: 100% 97%  ? ? ?Physical Exam: ?Gen: lethargic, elderly, no acute distress  ?HEENT: anicteric sclera ?CV: RRR ?Chest: CTA B ?Abd: soft, nontender, nondistended, +BS ?Ext: no edema ? ?Lab Results: ?Recent Labs  ?  07/05/21 ?0122 07/05/21 ?9476 07/06/21 ?5465  ?NA  --  137 138  ?K  --  3.5 3.5  ?CL  --  106 105  ?CO2  --  26 25  ?GLUCOSE  --  103* 102*  ?BUN  --  10 10  ?CREATININE  --  1.14 1.24  ?CALCIUM  --  8.4* 8.6*  ?MG 2.1  --  2.1  ?PHOS 4.2  --   --   ? ?Recent Labs  ?  07/05/21 ?0122 07/05/21 ?0354  ?AST 16 14*  ?ALT 14 14  ?ALKPHOS 57 50  ?BILITOT 0.4 0.5  ?PROT 6.3* 5.6*  ?ALBUMIN 3.3* 3.0*  ? ?Recent Labs  ?  07/05/21 ?1727 07/06/21 ?6568  ?WBC 9.0 7.8  ?HGB 8.7* 8.0*  ?HCT 28.4* 25.8*  ?MCV 74.3* 74.1*  ?PLT 443* 399  ? ? ? ? ?Assessment/Plan: ?Obscure overt GI bleed - capsule endoscopy showed active bleeding in the colon suspect due to AVMs. No small bowel AVMs seen. Colonoscopy prep today and repeat colonoscopy tomorrow. Discussed with daughter and she is agreeable to the colonoscopy. ? ? ?[name] ?07/06/2021, 3:18 PM ? ?Questions please call [phone] Patient ID: Matthew Knight, male   DOB: February 06, 1941, 81 y.o.   MRN: [id number] ? ?

## 2021-07-06 NOTE — Progress Notes (Signed)
Mobility Specialist Progress Note  ? ? 07/06/21 1100  ?Mobility  ?Activity Transferred from bed to chair  ?Level of Assistance Contact guard assist, steadying assist  ?Assistive Device Front wheel walker  ?Distance Ambulated (ft) 5 ft  ?Activity Response Tolerated well  ?$Mobility charge 1 Mobility  ? ?Pt received in bed after voiding and sheets soiled. No complaints. Left with call bell in reach and chair alarm on. Will f/u this pm as schedule permits.  ? ?Hildred Alamin ?Mobility Specialist  ?Primary: 5N M.S. Phone: [phone] ?Secondary: 6N M.S. Phone: [phone] ?  ?

## 2021-07-06 NOTE — Evaluation (Signed)
Clinical/Bedside Swallow Evaluation ?Patient Details  ?Name: Matthew Knight ?MRN: [id number] ?Date of Birth: 12/05/1940 ? ?Today's Date: 07/06/2021 ?Time: SLP Start Time (ACUTE ONLY): 1130 SLP Stop Time (ACUTE ONLY): 1135 ?SLP Time Calculation (min) (ACUTE ONLY): 5 min ? ?Past Medical History:  ?Past Medical History:  ?Diagnosis Date  ? Dementia ([place])   ? Diabetes mellitus without complication ([place])   ? Hearing loss   ? Hepatitis   ? Small vessel disease ([place])   ? Stroke [facility])   ? ?Past Surgical History:  ?Past Surgical History:  ?Procedure Laterality Date  ? APPENDECTOMY    ? COLONOSCOPY WITH PROPOFOL N/A 03/24/2021  ? Procedure: COLONOSCOPY WITH PROPOFOL;  Surgeon: Mauri Pole, MD;  Location: [facility];  Service: Endoscopy;  Laterality: N/A;  ? ESOPHAGOGASTRODUODENOSCOPY (EGD) WITH PROPOFOL N/A 03/24/2021  ? Procedure: ESOPHAGOGASTRODUODENOSCOPY (EGD) WITH PROPOFOL;  Surgeon: Mauri Pole, MD;  Location: [facility];  Service: Endoscopy;  Laterality: N/A;  ? GIVENS CAPSULE STUDY N/A 07/05/2021  ? Procedure: GIVENS CAPSULE STUDY;  Surgeon: Wilford Corner, MD;  Location: [facility];  Service: Gastroenterology;  Laterality: N/A;  ? HEMOSTASIS CLIP PLACEMENT  03/24/2021  ? Procedure: HEMOSTASIS CLIP PLACEMENT;  Surgeon: Mauri Pole, MD;  Location: [facility];  Service: Endoscopy;;  ? HOT HEMOSTASIS N/A 03/24/2021  ? Procedure: HOT HEMOSTASIS (ARGON PLASMA COAGULATION/BICAP);  Surgeon: Mauri Pole, MD;  Location[facility] END[facility];  Service: Endoscopy;  Laterality: N/A;  ? POLYPECTOMY  03/24/2021  ? Procedure: POLYPECTOMY;  Surgeon: Mauri Pole, MD;  Location: [place];  Service: Endoscopy;;  ? ?HPI:  ?Matthew Knight is an 81 year old male who presented to [facility] ED on 4/29 following unwitnessed fall after return to SNF ~30 minutes after discharge from [facility].  Patient was complaining of left-sided facial pain and left rib pain. Pt was hospitalized at [facility] from 4/26-4/29 for  GI bleeding that was treated conservatively.  Ongoing concern for GIB this admission, with GI assessment pending. Pt with past medical history significant for dementia, history of TIA, HTN, HLD, BPH, anxiety/depression, colonic AVMs.  ?  ?Assessment / Plan / Recommendation  ?Clinical Impression ? Pt tolerated thin liquid by cup and straw with no clinical s/s of aspiration.  GI now planning colonoscopy next date and placed on clear liquid diet (no red dye) pending evaluation.   Pt appears safe to continue clear liquid diet at this time.  No other consistencies were offered due to planned colonoscopy.  SLP will assess more advanced consistencies once cleared by GI. ? ?Recommend continuing clear liquid diet.  ? ?SLP Visit Diagnosis: Dysphagia, unspecified (R13.10) ?   ?Aspiration Risk ? No limitations  ?  ?Diet Recommendation Thin liquid  ? ?Liquid Administration via: Cup;Straw ?Medication Administration: Whole meds with liquid ?Supervision: Patient able to self feed ?Compensations: Slow rate;Small sips/bites ?Postural Changes: Seated upright at 90 degrees  ?  ?Other  Recommendations Oral Care Recommendations: Oral care BID   ? ?Recommendations for follow up therapy are one component of a multi-disciplinary discharge planning process, led by the attending physician.  Recommendations may be updated based on patient status, additional functional criteria and insurance authorization. ? ?Follow up Recommendations No SLP follow up  ? ? ?  ?Assistance Recommended at Discharge Intermittent Supervision/Assistance  ?Functional Status Assessment Patient has had a recent decline in their functional status and demonstrates the ability to make significant improvements in function in a reasonable and predictable amount of time.  ?Frequency and Duration min 2x/week  ?2  weeks ?  ?   ? ?Prognosis Prognosis for Safe Diet Advancement: Good  ? ?  ? ?Swallow Study   ?General HPI: Matthew Knight is an 81 year old male who presented  to [facility] ED on 4/29 following unwitnessed fall after return to SNF ~30 minutes after discharge from [facility].  Patient was complaining of left-sided facial pain and left rib pain. Pt was hospitalized at [facility] from 4/26-4/29 for GI bleeding that was treated conservatively.  Ongoing concern for GIB this admission, with GI assessment pending. Pt with past medical history significant for dementia, history of TIA, HTN, HLD, BPH, anxiety/depression, colonic AVMs. ?Type of Study: Bedside Swallow Evaluation ?Previous Swallow Assessment: none ?Diet Prior to this Study: Thin liquids ?Temperature Spikes Noted: No ?Respiratory Status: Room air ?History of Recent Intubation: No ?Behavior/Cognition: Alert;Cooperative;Pleasant mood ?Oral Cavity Assessment: Within Functional Limits ?Oral Care Completed by SLP: No ?Oral Cavity - Dentition: Adequate natural dentition ?Vision: Functional for self-feeding ?Patient Positioning: Upright in chair ?Baseline Vocal Quality: Normal ?Volitional Cough: Strong ?Volitional Swallow: Able to elicit  ?  ?Oral/Motor/Sensory Function Overall Oral Motor/Sensory Function: Within functional limits ?Facial ROM: Within Functional Limits ?Facial Symmetry: Within Functional Limits ?Lingual ROM: Within Functional Limits ?Lingual Symmetry: Within Functional Limits ?Lingual Strength: Within Functional Limits ?Velum: Within Functional Limits ?Mandible: Within Functional Limits   ?Ice Chips Ice chips: Not tested   ?Thin Liquid Thin Liquid: Within functional limits ?Presentation: Cup;Straw  ?  ?Nectar Thick Nectar Thick Liquid: Not tested   ?Honey Thick Honey Thick Liquid: Not tested   ?Puree Puree: Not tested   ?Solid ? ? ?  Solid: Not tested  ? ?  ? ?[name] E [name], MA, CCC-SLP ?Acute Rehabilitation Services ?Office: [phone] ?07/06/2021,11:46 AM ? ? ? ?

## 2021-07-06 NOTE — H&P (View-Only) (Signed)
[facility] Progress Note ? ?Matthew Knight 81 y.o. 1940-07-15 ? ? ?Subjective: ?Feels ok. Denies abdominal pain. ? ?Objective: ?Vital signs: ?Vitals:  ? 07/06/21 0458 07/06/21 0736  ?BP: (!) 133/55 (!) 153/76  ?Pulse: 67 64  ?Resp: 13 14  ?Temp: 98.5 ?F (36.9 ?C) 98.6 ?F (37 ?C)  ?SpO2: 100% 97%  ? ? ?Physical Exam: ?Gen: lethargic, elderly, no acute distress  ?HEENT: anicteric sclera ?CV: RRR ?Chest: CTA B ?Abd: soft, nontender, nondistended, +BS ?Ext: no edema ? ?Lab Results: ?Recent Labs  ?  07/05/21 ?0122 07/05/21 ?4709 07/06/21 ?6283  ?NA  --  137 138  ?K  --  3.5 3.5  ?CL  --  106 105  ?CO2  --  26 25  ?GLUCOSE  --  103* 102*  ?BUN  --  10 10  ?CREATININE  --  1.14 1.24  ?CALCIUM  --  8.4* 8.6*  ?MG 2.1  --  2.1  ?PHOS 4.2  --   --   ? ?Recent Labs  ?  07/05/21 ?0122 07/05/21 ?6629  ?AST 16 14*  ?ALT 14 14  ?ALKPHOS 57 50  ?BILITOT 0.4 0.5  ?PROT 6.3* 5.6*  ?ALBUMIN 3.3* 3.0*  ? ?Recent Labs  ?  07/05/21 ?1727 07/06/21 ?4765  ?WBC 9.0 7.8  ?HGB 8.7* 8.0*  ?HCT 28.4* 25.8*  ?MCV 74.3* 74.1*  ?PLT 443* 399  ? ? ? ? ?Assessment/Plan: ?Obscure overt GI bleed - capsule endoscopy showed active bleeding in the colon suspect due to AVMs. No small bowel AVMs seen. Colonoscopy prep today and repeat colonoscopy tomorrow. Discussed with daughter and she is agreeable to the colonoscopy. ? ? ?Matthew Knight ?07/06/2021, 3:18 PM ? ?Questions please call [phone] Patient ID: Matthew Knight, male   DOB: Dec 29, 1940, 81 y.o.   MRN: [id number] ? ?

## 2021-07-06 NOTE — Progress Notes (Signed)
Initial Nutrition Assessment ? ?DOCUMENTATION CODES:  ? ?Not applicable ? ?INTERVENTION:  ? ?Multivitamin w/ minerals daily ?Boost Breeze po TID, each supplement provides 250 kcal and 9 grams of protein ?Meal ordering with assist  ? ?NUTRITION DIAGNOSIS:  ? ?Increased nutrient needs related to acute illness as evidenced by estimated needs. ? ?GOAL:  ? ?Patient will meet greater than or equal to 90% of their needs ? ?MONITOR:  ? ?PO intake, Supplement acceptance, Diet advancement, Weight trends ? ?REASON FOR ASSESSMENT:  ? ?Consult ?Assessment of nutrition requirement/status ? ?ASSESSMENT:  ? ?81 y.o. male presented to the ED from an independent living facility after a fall. Discharged from [facility], found to have an acute GI bleed. PMH includes dementia, TIA, HTN, and hx of GI bleeds. Pt admitted with symptomatic anemia.  ? ?Per GI note, pt had a capsule endoscopy yesterday; currently awaiting results.  ? ?RN in room at time of RD visit. Pt noted to be hard or hearing and confused at times.  ? ?Pt states that he is hungry and would like something to eat. Denies any nausea or vomiting.  ? ?Pt denies any recent weight loss. Per EMR, pt has had a 4% weight loss within 2 months. This is not clinically significant for time frame.  ? ?RD to order pt lunch and supplements; discussed plan with RN.  ? ?Medications reviewed and include: Colace, Protonix, IV ferric gluconate  ?Labs reviewed. ? ?NUTRITION - FOCUSED PHYSICAL EXAM: ? ?Flowsheet Row Most Recent Value  ?Orbital Region Mild depletion  ?Upper Arm Region Mild depletion  ?Thoracic and Lumbar Region No depletion  ?Buccal Region Mild depletion  ?Temple Region No depletion  ?Clavicle Bone Region No depletion  ?Clavicle and Acromion Bone Region No depletion  ?Scapular Bone Region No depletion  ?Dorsal Hand No depletion  ?Patellar Region No depletion  ?Anterior Thigh Region No depletion  ?Posterior Calf Region No depletion  ?Edema (RD Assessment) None  ?Hair Reviewed   ?Eyes Reviewed  ?Mouth Reviewed  ?Skin Reviewed  ?Nails Reviewed  ? ?Diet Order:   ?Diet Order   ? ?       ?  Diet clear liquid Room service appropriate? Yes; Fluid consistency: Thin  Diet effective now       ?  ? ?  ?  ? ?  ? ? ?EDUCATION NEEDS:  ? ?Not appropriate for education at this time ? ?Skin:  Skin Assessment: Reviewed RN Assessment ? ?Last BM:  Prior to Admission ? ?Height:  ? ?Ht Readings from Last 1 Encounters:  ?07/05/21 6' (1.829 m)  ? ? ?Weight:  ? ?Wt Readings from Last 1 Encounters:  ?07/05/21 95.4 kg  ? ? ?Ideal Body Weight:  80.9 kg ? ?BMI:  Body mass index is 28.52 kg/m?. ? ?Estimated Nutritional Needs:  ? ?Kcal:  2000-2200 ? ?Protein:  100-115 grams ? ?Fluid:  >/= 2 L ? ? ? ?Hermina Barters RD, LDN ?Clinical Dietitian ?See AMiON for contact information.  ? ?

## 2021-07-06 NOTE — Progress Notes (Signed)
?PROGRESS NOTE ? ? ? ?Matthew Knight  ERD:[id number] DOB: [birth year] DOA: 07/04/2021 ?PCP: [name], MD  ? ? ?Brief Narrative:  ? ?Matthew Knight is an 81 year old male with past medical history significant for dementia, history of TIA, HTN, HLD, BPH, anxiety/depression, colonic AVMs who presented to [facility] ED on 4/29 following unwitnessed fall after return to SNF.  Patient was complaining of left-sided facial pain and left rib pain.  EMS was activated and brought to the ED for further evaluation. ? ?Patient was recently discharged same day from [facility] in which he was hospitalized from 4 12/26 through 4/29 for GI bleeding that was treated conservatively. ? ?Assessment & Plan: ?  ? 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  Treated conservatively with receiving 1 unit PRBC.  Hemoglobin at time of discharge was 9.2.  On ED arrival same day in the evening was 8.4.  FOBT positive.  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 ?[facility] following, appreciate assistance ?--Hgb 8.4>8.9>7.8>8.4>8.8>7.8>8.4>8.7>8.0 ?--Underwent video capsule endoscopy yesterday, awaiting results ?--Await further recommendations per GI ?--H/H q12h ? ?Unwitnessed fall ?Physical deconditioning ?Orthostatic hypotension ?From [facility] independent living. Evaluated by PT/OT with recommendations of return to IDL if has 24-hour support versus SNF placement.  Given patient with lack of social support and recurrent hospitalization less than 30 minutes from discharge, likely reasonable for attempt at short-term rehabilitation. ?--Repeat IV fluid bolus today ?--Orthostatic vital signs daily ?--TOC for SNF placement ? ?Essential hypertension ?--Lisinopril 10 mg p.o. daily ?--Hold home Imdur 30 mg p.o.  daily ? ?Hyperlipidemia ?--Pravastatin 20 mg p.o. daily ? ?Anxiety/depression: ?--Lexapro 20 mg p.o. daily ? ?BPH ?On Rapaflo at home. ?--Tamsulosin 0.4 mg p.o. daily ? ?GERD ?--Protonix 40 mg p.o. daily ? ?History of TIA ?Had been previously on aspirin and Plavix but has been discontinued due to recurrent GI bleed episodes. ? ? ?DVT prophylaxis: SCDs Start: 07/05/21 0315 ? ?  Code Status: DNR ?Family Communication: No family present at bedside this morning ? ?Disposition Plan:  ?Level of care: Telemetry Medical ?Status is: Inpatient ?Remains inpatient appropriate because: Awaiting results of capsule endoscopy and further recommendations from GI, also will likely need SNF placement, TOC aware ? ? ? ?Consultants:  ?[facility] ? ?Procedures:  ?Capsule endoscopy: Pending ? ?Antimicrobials:  ?None ? ? ?Subjective: ?Patient seen examined bedside, resting comfortably.  Pleasantly confused.  States "feels great".  No other specific complaints or concerns at this time. Awaiting results of capsule endoscopy.  No other specific questions or concerns this morning.  Denies headache, no chest pain, no shortness of breath, no abdominal pain.  No acute events overnight per nurse staff. ? ?Objective: ?Vitals:  ? 07/05/21 1047 07/05/21 2045 07/06/21 0458 07/06/21 0736  ?BP: 134/62 (!) 130/57 (!) 133/55 (!) 153/76  ?Pulse: 61 76 67 64  ?Resp: '17 18 13 14  '$ ?Temp: 98.3 ?F (36.8 ?C) 98.1 ?F (36.7 ?C) 98.5 ?F (36.9 ?C) 98.6 ?F (37 ?C)  ?TempSrc: Oral Oral Oral Oral  ?SpO2: 96% 96% 100% 97%  ?Weight: 95.4 kg     ?Height: 6' (1.829 m)     ? ? ?Intake/Output Summary (Last 24 hours) at 07/06/2021 1101 ?Last data filed at 07/05/2021 1500 ?Gross per 24 hour  ?Intake 812.5 ml  ?Output --  ?Net 812.5 ml  ? ?Filed  Weights  ? 07/05/21 1047  ?Weight: 95.4 kg  ? ? ?Examination: ? ?Physical Exam: ?GEN: NAD, alert, oriented to place  [facility] ), but not time (2003) or person [facility]) wd/wn ?HEENT: NCAT, PERRL, EOMI,  sclera clear, MMM ?PULM: CTAB w/o wheezes/crackles, normal respiratory effort, on room air ?CV: RRR w/o M/G/R ?GI: abd soft, NTND, NABS, no R/G/M ?MSK: no peripheral edema, muscle strength globally intact 5/5 bilateral upper/lower extremities ?NEURO: CN II-XII intact, no focal deficits, sensation to light touch intact ?PSYCH: normal mood/affect ?Integumentary: dry/intact, no rashes or wounds ? ? ? ?Data Reviewed: I have personally reviewed following labs and imaging studies ? ?CBC: ?Recent Labs  ?Lab 07/03/21 ?0741 07/04/21 ?0920 07/05/21 ?0122 07/05/21 ?9518 07/05/21 ?1051 07/05/21 ?1727 07/06/21 ?8416  ?WBC 8.8   < > 9.5 8.9 8.6 9.0 7.8  ?NEUTROABS QUANTITY NOT SUFFICIENT, UNABLE TO PERFORM TEST  --   --   --   --   --   --   ?HGB 9.3*   < > 8.9* 7.8* 8.4* 8.7* 8.0*  ?HCT 31.3*   < > 28.6* 25.8* 27.1* 28.4* 25.8*  ?MCV 75.8*   < > 74.1* 75.0* 73.6* 74.3* 74.1*  ?PLT 403*   < > 403* 417* 431* 443* 399  ? < > = values in this interval not displayed.  ? ?Basic Metabolic Panel: ?Recent Labs  ?Lab 07/02/21 ?0007 07/03/21 ?6063 07/04/21 ?1813 07/05/21 ?0122 07/05/21 ?0160 07/06/21 ?1093  ?NA 139 141 140  --  137 138  ?K 3.4* 3.7 3.6  --  3.5 3.5  ?CL 110 111 105  --  106 105  ?CO2 '23 24 27  '$ --  26 25  ?GLUCOSE 105* 91 122*  --  103* 102*  ?BUN '14 10 9  '$ --  10 10  ?CREATININE 1.12 1.08 1.21  --  1.14 1.24  ?CALCIUM 8.7* 8.8* 9.1  --  8.4* 8.6*  ?MG  --   --   --  2.1  --  2.1  ?PHOS  --   --   --  4.2  --   --   ? ?GFR: ?Estimated Creatinine Clearance: 56.9 mL/min (by C-G formula based on SCr of 1.24 mg/dL). ?Liver Function Tests: ?Recent Labs  ?Lab 07/01/21 ?1159 07/02/21 ?0007 07/05/21 ?0122 07/05/21 ?2355  ?AST '18 17 16 '$ 14*  ?ALT '16 15 14 14  '$ ?ALKPHOS 66 62 57 50  ?BILITOT 0.6 0.6 0.4 0.5  ?PROT 6.9 6.6 6.3* 5.6*  ?ALBUMIN 3.7 3.5 3.3* 3.0*  ? ?No results for input(s): LIPASE, AMYLASE in the last 168 hours. ?No results for input(s): AMMONIA in the last 168 hours. ?Coagulation Profile: ?Recent Labs  ?Lab 07/01/21 ?1159  07/05/21 ?0122  ?INR 1.1 1.0  ? ?Cardiac Enzymes: ?Recent Labs  ?Lab 07/05/21 ?0122  ?CKTOTAL 58  ? ?BNP (last 3 results) ?No results for input(s): PROBNP in the last 8760 hours. ?HbA1C: ?No results for input(s): HGBA1C in the last 72 hours. ?CBG: ?No results for input(s): GLUCAP in the last 168 hours. ?Lipid Profile: ?No results for input(s): CHOL, HDL, LDLCALC, TRIG, CHOLHDL, LDLDIRECT in the last 72 hours. ?Thyroid Function Tests: ?Recent Labs  ?  07/05/21 ?0122  ?TSH 0.850  ? ?Anemia Panel: ?Recent Labs  ?  07/05/21 ?1051  ?VITAMINB12 472  ?FOLATE 21.8  ?FERRITIN 4*  ?TIBC 511*  ?IRON 13*  ?RETICCTPCT 1.7  ? ?Sepsis Labs: ?No results for input(s): PROCALCITON, LATICACIDVEN in the last 168 hours. ? ?No results found  for this or any previous visit (from the past 240 hour(s)).  ? ? ? ? ? ?Radiology Studies: ?CT Head Wo Contrast ? ?Result Date: 07/04/2021 ?CLINICAL DATA:  Un witnessed fall with headaches and neck pain, initial encounter EXAM: CT HEAD WITHOUT CONTRAST CT CERVICAL SPINE WITHOUT CONTRAST TECHNIQUE: Multidetector CT imaging of the head and cervical spine was performed following the standard protocol without intravenous contrast. Multiplanar CT image reconstructions of the cervical spine were also generated. RADIATION DOSE REDUCTION: This exam was performed according to the departmental dose-optimization program which includes automated exposure control, adjustment of the mA and/or kV according to patient size and/or use of iterative reconstruction technique. COMPARISON:  03/19/2021 FINDINGS: CT HEAD FINDINGS Brain: No evidence of acute infarction, hemorrhage, hydrocephalus, extra-axial collection or mass lesion/mass effect. Mild atrophic changes and chronic white matter ischemic changes are noted similar to that seen on prior exam. Vascular: No hyperdense vessel or unexpected calcification. Skull: Normal. Negative for fracture or focal lesion. Sinuses/Orbits: No acute finding. Other: None. CT CERVICAL  SPINE FINDINGS Alignment: Stable loss of the normal cervical lordosis is seen. Mild anterolisthesis of C4 on C5 and C5 on C6 is noted. Skull base and vertebrae: Multilevel osteophytic changes are seen. No acute fracture or

## 2021-07-06 NOTE — Progress Notes (Signed)
SLP Cancellation Note ? ?Patient Details ?Name: AUDIEL SCHEIBER ?MRN: [id number] ?DOB: [birth year] ? ? ?Cancelled treatment:        Attempted to see pt for swallowing evaluation.  Pt remains NPO with plans for capsule endoscopy with GI this date (per GI note).  SLP will reattempt swallowing assessment once cleared by GI. ? ? ?Shacara Cozine E Lihanna Biever, MA, CCC-SLP ?Acute Rehabilitation Services ?Office: [phone] ?07/06/2021, 9:58 AM ?

## 2021-07-07 ENCOUNTER — Other Ambulatory Visit (HOSPITAL_COMMUNITY): Payer: Self-pay

## 2021-07-07 ENCOUNTER — Encounter (HOSPITAL_COMMUNITY)
Admission: EM | Disposition: A | Discharge status: Home Health | Payer: Self-pay | Source: Skilled Nursing Facility | Attending: Internal Medicine

## 2021-07-07 DIAGNOSIS — D649 Anemia, unspecified: Secondary | ICD-10-CM | POA: Diagnosis not present

## 2021-07-07 LAB — BASIC METABOLIC PANEL
Anion gap: 6 (ref 5–15)
BUN: 7 mg/dL — ABNORMAL LOW (ref 8–23)
CO2: 25 mmol/L (ref 22–32)
Calcium: 8.5 mg/dL — ABNORMAL LOW (ref 8.9–10.3)
Chloride: 108 mmol/L (ref 98–111)
Creatinine, Ser: 1.16 mg/dL (ref 0.61–1.24)
GFR, Estimated: >60 mL/min (ref >60)
Glucose, Bld: 94 mg/dL (ref 70–99)
Potassium: 3.3 mmol/L — ABNORMAL LOW (ref 3.5–5.1)
Sodium: 139 mmol/L (ref 135–145)

## 2021-07-07 LAB — CBC
HCT: 27.6 % — ABNORMAL LOW (ref 39.0–52.0)
Hemoglobin: 8.2 g/dL — ABNORMAL LOW (ref 13.0–17.0)
MCH: 22.1 pg — ABNORMAL LOW (ref 26.0–34.0)
MCHC: 29.7 g/dL — ABNORMAL LOW (ref 30.0–36.0)
MCV: 74.4 fL — ABNORMAL LOW (ref 80.0–100.0)
Platelets: 447 10*3/uL — ABNORMAL HIGH (ref 150–400)
RBC: 3.71 MIL/uL — ABNORMAL LOW (ref 4.22–5.81)
RDW: 17.8 % — ABNORMAL HIGH (ref 11.5–15.5)
WBC: 7 10*3/uL (ref 4.0–10.5)
nRBC: 0 % (ref 0.0–0.2)

## 2021-07-07 SURGERY — COLONOSCOPY WITH PROPOFOL
Anesthesia: Monitor Anesthesia Care

## 2021-07-07 MED ORDER — POLYETHYLENE GLYCOL 3350 17 GM/SCOOP PO POWD
1.0000 | Freq: Once | ORAL | Status: AC
Start: 2021-07-07 — End: 2021-07-07
  Administered 2021-07-07: 255 g via ORAL
  Filled 2021-07-07: qty 255

## 2021-07-07 MED ORDER — POTASSIUM CHLORIDE 10 MEQ/100ML IV SOLN
10.0000 meq | INTRAVENOUS | Status: AC
  Administered 2021-07-07 (×3): 10 meq via INTRAVENOUS
  Filled 2021-07-07 (×3): qty 100

## 2021-07-07 NOTE — Progress Notes (Signed)
Patient had no BM's throughout the night and does not recall drinking prep. Night nurse also stated patient was did not have prep to drink. Notified  Endo and MD.  Will postpone procedure until tomorrow. OK to restart clear liquids and will be NPO again tonight.  Daughter notified, pleased it was caught and father did not have anesthesia if not properly prepped.   ?

## 2021-07-07 NOTE — Progress Notes (Signed)
Physical Therapy Treatment ?Patient Details ?Name: Matthew Knight ?MRN: [id number] ?DOB: 1940-07-03 ?Today's Date: 07/07/2021 ? ? ?History of Present Illness The pt is an 81 yo male presenting 4/29 after sustaining an unwitnessed fall (presumed syncopal episode) ~30 min after being d/c from [facility] ED on 4/29. Pt presented to [facility] ED 4/26-4/29 for GI bleed with hemoglobin to low of 7.6. Plan for capsule endoscopy 5/1. PMH includes: dementia, DM II, hearing loss, and stroke ? ?  ?PT Comments  ? ? Pt received OOB in chair and agreeable to session with continued progress towards acute goals. Pt able to come to standing and ambulate without AD with fair stability, no LOB but tendency to drift to L, pt able to self-correct in all instances. Pt without c/o dizziness/nausea this session with BP dropping upon standing but recovering with ambulation, pt asymptomatic throughout. Current plan remains appropriate to address deficits and maximize functional independence and decrease caregiver burden. Pt continues to benefit from skilled PT services to progress toward functional mobility goals.  ? ?Seated in recliner BP - 144/67  HR: 65bpm ?Standing BP - 127/71 HR: 98bpm ?Standing after 45' ambulation BP- 147/63 HR:110 bpm ?  ?Recommendations for follow up therapy are one component of a multi-disciplinary discharge planning process, led by the attending physician.  Recommendations may be updated based on patient status, additional functional criteria and insurance authorization. ? ?Follow Up Recommendations ? Home health PT ?  ?  ?Assistance Recommended at Discharge Frequent or constant Supervision/Assistance  ?Patient can return home with the following A little help with walking and/or transfers;A little help with bathing/dressing/bathroom;Direct supervision/assist for medications management;Direct supervision/assist for financial management;Assist for transportation;Help with stairs or ramp for entrance;Assistance with  cooking/housework ?  ?Equipment Recommendations ? Rolling walker (2 wheels)  ?  ?Recommendations for Other Services   ? ? ?  ?Precautions / Restrictions Precautions ?Precautions: Fall ?Precaution Comments: pt with multiple recent falls, watch orthostatics ?Restrictions ?Weight Bearing Restrictions: No  ?  ? ?Mobility ? Bed Mobility ?Overal bed mobility: Modified Independent ?  ?  ?  ?  ?  ?  ?General bed mobility comments: pt OOB on arrival ?  ? ?Transfers ?Overall transfer level: Needs assistance ?Equipment used: Rolling walker (2 wheels), None ?Transfers: Sit to/from Stand ?Sit to Stand: Min guard ?  ?  ?  ?  ?  ?General transfer comment: minG for safety, pt able to maintain static stance without UE support ?  ? ?Ambulation/Gait ?Ambulation/Gait assistance: Min guard ?Gait Distance (Feet): 90 Feet ?Assistive device: None ?Gait Pattern/deviations: Step-through pattern ?Gait velocity: decreased ?  ?  ?General Gait Details: fair stability without AD, some general unsteadiness, tendency for drifting L, HR to 110bpm with gait. ? ? ?Stairs ?  ?  ?  ?  ?  ? ? ?Wheelchair Mobility ?  ? ?Modified Rankin (Stroke Patients Only) ?  ? ? ?  ?Balance Overall balance assessment: Needs assistance ?Sitting-balance support: No upper extremity supported, Feet supported ?Sitting balance-Leahy Scale: Fair ?Sitting balance - Comments: LOB when donning socks ?  ?Standing balance support: No upper extremity supported, During functional activity ?Standing balance-Leahy Scale: Fair ?Standing balance comment: able to mobilize with RW and without short distances ?  ?  ?  ?  ?  ?  ?  ?  ?  ?  ?  ?  ? ?  ?Cognition Arousal/Alertness: Awake/alert ?Behavior During Therapy: [facility] for tasks assessed/performed ?Overall Cognitive Status: History of cognitive impairments - at baseline ?Area of  Impairment: Problem solving, Memory, Orientation, Awareness, Following commands ?  ?  ?  ?  ?  ?  ?  ?  ?Orientation Level: Disoriented to, Place, Time ?   ?Memory: Decreased short-term memory ?Following Commands: Follows one step commands with increased time, Follows one step commands consistently ?  ?Awareness: Emergent ?Problem Solving: Requires verbal cues ?General Comments: pt with known dementia, poor short term memory in session but able to follow simple cues. ?  ?  ? ?  ?Exercises   ? ?  ?General Comments   ?  ?  ? ?Pertinent Vitals/Pain Pain Assessment ?Pain Assessment: No/denies pain  ? ? ?Home Living   ?  ?  ?  ?  ?  ?  ?  ?  ?  ?   ?  ?Prior Function    ?  ?  ?   ? ?PT Goals (current goals can now be found in the care plan section) Acute Rehab PT Goals ?PT Goal Formulation: With patient ?Time For Goal Achievement: 07/19/21 ? ?  ?Frequency ? ? ? Min 3X/week ? ? ? ?  ?PT Plan    ? ? ?Co-evaluation   ?  ?  ?  ?  ? ?  ?AM-PAC PT "6 Clicks" Mobility   ?Outcome Measure ? Help needed turning from your back to your side while in a flat bed without using bedrails?: None ?Help needed moving from lying on your back to sitting on the side of a flat bed without using bedrails?: A Little ?Help needed moving to and from a bed to a chair (including a wheelchair)?: A Little ?Help needed standing up from a chair using your arms (e.g., wheelchair or bedside chair)?: A Little ?Help needed to walk in hospital room?: A Little ?Help needed climbing 3-5 steps with a railing? : A Little ?6 Click Score: 19 ? ?  ?End of Session Equipment Utilized During Treatment: Gait belt ?Activity Tolerance: Patient tolerated treatment well ?Patient left: in chair;with call bell/phone within reach;with chair alarm set ?Nurse Communication: Mobility status ?PT Visit Diagnosis: Unsteadiness on feet (R26.81);Muscle weakness (generalized) (M62.81) ?  ? ? ?Time: 8453-6468 ?PT Time Calculation (min) (ACUTE ONLY): 25 min ? ?Charges:  $Gait Training: 23-37 mins          ?          ? ?Matthew Knight. PTA ?Acute Rehabilitation Services ?Office: [phone] ? ? ? ?Matthew Knight Matthew Knight ?07/07/2021, 11:23 AM ? ?

## 2021-07-07 NOTE — Progress Notes (Addendum)
Mobility Specialist Progress Note  ? ? 07/07/21 1510  ?Mobility  ?Activity Transferred from chair to bed  ?Level of Assistance Contact guard assist, steadying assist  ?Assistive Device Front wheel walker  ?Distance Ambulated (ft) 5 ft  ?Activity Response Tolerated well  ?$Mobility charge 1 Mobility  ? ?Pt received and declining further ambulation. C/o some dizziness upon standing. Left in bed with call bell in reach and bed alarm on.  ? ?Hildred Alamin ?Mobility Specialist  ?Primary: 5N M.S. Phone: [phone] ?Secondary: 6N M.S. Phone: [phone] ?  ?

## 2021-07-07 NOTE — Plan of Care (Signed)
  Problem: Clinical Measurements: Goal: Will remain free from infection Outcome: Progressing   Problem: Activity: Goal: Risk for activity intolerance will decrease Outcome: Progressing   Problem: Pain Managment: Goal: General experience of comfort will improve Outcome: Progressing   

## 2021-07-07 NOTE — Progress Notes (Addendum)
?PROGRESS NOTE ? ? ? ?Matthew Knight  QPR:[id number] DOB: 06/25/1940 DOA: 07/04/2021 ?PCP: [name], MD  ? ? ?Brief Narrative:  ? ?Matthew Knight is an 81 year old male with past medical history significant for dementia, history of TIA, HTN, HLD, BPH, anxiety/depression, colonic AVMs who presented to [facility] ED on 4/29 following unwitnessed fall after return to SNF.  Patient was complaining of left-sided facial pain and left rib pain.  EMS was activated and brought to the ED for further evaluation. ? ?Patient was recently discharged same day from [facility] hospital in which he was hospitalized from 4 12/26 through 4/29 for GI bleeding that was treated conservatively. ? ?Assessment & Plan: ?  ? ?Acute on chronic blood loss anemia likely secondary to GIB/colonic AVM ?Patient presenting to ED from SNF after discharge same day from [facility] for GI bleeding.  Treated conservatively with receiving 1 unit PRBC.  Hemoglobin at time of discharge was 9.2.  On ED arrival same day in the evening was 8.4.  FOBT positive.  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 ?[facility] following, appreciate assistance ?--Hgb 8.4>8.9>7.8>8.4>8.8>7.8>8.4>8.7>8.0>8.4>8.2 ?--Underwent video capsule endoscopy 4/30 with results of acute bleeding within the colon likely secondary to AVM ?--Pending colonoscopy, now delayed to 5/3 because did not receive prep last night ?--CBC daily ? ?Unwitnessed fall ?Physical deconditioning ?Orthostatic hypotension ?From [facility] independent living. Evaluated by PT/OT with recommendations of return to IDL if has 24-hour support versus SNF placement.  ?--Orthostatic vital signs daily ?--Likely to return to independent living per daughter as patient has aides available from [place] ? ?Hypokalemia ?Potassium 3.3, will replete. ?--Repeat electrolytes in a.m. to include magnesium ? ?Essential  hypertension ?--Lisinopril 10 mg p.o. daily ?--Hold home Imdur 30 mg p.o. daily ? ?Hyperlipidemia ?--Pravastatin 20 mg p.o. daily ? ?Anxiety/depression: ?--Lexapro 20 mg p.o. daily ? ?BPH ?On Rapaflo at home. ?--Tamsulosin 0.4 mg p.o. daily ? ?GERD ?--Protonix 40 mg p.o. daily ? ?History of TIA ?Had been previously on aspirin and Plavix but has been discontinued due to recurrent GI bleed episodes. ? ? ?DVT prophylaxis: SCDs Start: 07/05/21 0315 ? ?  Code Status: DNR ?Family Communication: No family present at bedside this morning ? ?Disposition Plan:  ?Level of care: Telemetry Medical ?Status is: Inpatient ?Remains inpatient appropriate because: Pending colonoscopy, now delayed until tomorrow as patient did not receive prep overnight, anticipate likely discharge back to [facility] with likely home health ? ? ? ?Consultants:  ?[facility] ? ?Procedures:  ?Capsule endoscopy: Active bleeding: Likely secondary to AVM ?Colonoscopy pending for 5/3 ? ?Antimicrobials:  ?None ? ? ?Subjective: ?Patient seen examined bedside, resting comfortably.  Sleeping but easily arousable.  Pleasantly confused.  No complaints this morning.  Capsule endoscopy with active bleeding: Likely secondary to AVM and was planned for colonoscopy today but unfortunately did not receive colon prep overnight and now colonoscopy will be delayed until tomorrow. No other specific questions or concerns this morning.  Denies headache, no chest pain, no shortness of breath, no abdominal pain.  No acute events overnight per nursing staff. ? ?Objective: ?Vitals:  ? 07/06/21 1934 07/07/21 0221 07/07/21 0800 07/07/21 0812  ?BP:  (!) 117/54 (!) 150/45 (!) 135/45  ?Pulse:  66 (!) 58   ?Resp:  20    ?Temp: 98.3 ?F (36.8 ?C) (!) 97.4 ?F (36.3 ?C) 98 ?F (36.7 ?C)   ?TempSrc:  Oral Oral   ?SpO2:  93% 98%   ?  Weight:      ?Height:      ? ?No intake or output data in the 24 hours ending 07/07/21 1042 ? ?Filed Weights  ? 07/05/21 1047  ?Weight: 95.4 kg   ? ? ?Examination: ? ?Physical Exam: ?GEN: NAD, alert, oriented to place  [facility]), but not time (2003) or person ([employer] [place]) elderly in appearance ?HEENT: NCAT, PERRL, EOMI, sclera clear, MMM ?PULM: CTAB w/o wheezes/crackles, normal respiratory effort, on room air ?CV: RRR w/o M/G/R ?GI: abd soft, NTND, NABS, no R/G/M ?MSK: no peripheral edema, muscle strength globally intact 5/5 bilateral upper/lower extremities ?NEURO: CN II-XII intact, no focal deficits, sensation to light touch intact ?PSYCH: normal mood/affect ?Integumentary: dry/intact, no rashes or wounds ? ? ? ?Data Reviewed: I have personally reviewed following labs and imaging studies ? ?CBC: ?Recent Labs  ?Lab 07/03/21 ?0741 07/04/21 ?0920 07/05/21 ?0428 07/05/21 ?1051 07/05/21 ?1727 07/06/21 ?8657 07/06/21 ?1629 07/07/21 ?0152  ?WBC 8.8   < > 8.9 8.6 9.0 7.8  --  7.0  ?NEUTROABS QUANTITY NOT SUFFICIENT, UNABLE TO PERFORM TEST  --   --   --   --   --   --   --   ?HGB 9.3*   < > 7.8* 8.4* 8.7* 8.0* 8.4* 8.2*  ?HCT 31.3*   < > 25.8* 27.1* 28.4* 25.8* 27.6* 27.6*  ?MCV 75.8*   < > 75.0* 73.6* 74.3* 74.1*  --  74.4*  ?PLT 403*   < > 417* 431* 443* 399  --  447*  ? < > = values in this interval not displayed.  ? ?Basic Metabolic Panel: ?Recent Labs  ?Lab 07/03/21 ?0741 07/04/21 ?1813 07/05/21 ?0122 07/05/21 ?8469 07/06/21 ?6295 07/07/21 ?0152  ?NA 141 140  --  137 138 139  ?K 3.7 3.6  --  3.5 3.5 3.3*  ?CL 111 105  --  106 105 108  ?CO2 24 27  --  '26 25 25  '$ ?GLUCOSE 91 122*  --  103* 102* 94  ?BUN 10 9  --  10 10 7*  ?CREATININE 1.08 1.21  --  1.14 1.24 1.16  ?CALCIUM 8.8* 9.1  --  8.4* 8.6* 8.5*  ?MG  --   --  2.1  --  2.1  --   ?PHOS  --   --  4.2  --   --   --   ? ?GFR: ?Estimated Creatinine Clearance: 60.8 mL/min (by C-G formula based on SCr of 1.16 mg/dL). ?Liver Function Tests: ?Recent Labs  ?Lab 07/01/21 ?1159 07/02/21 ?0007 07/05/21 ?0122 07/05/21 ?2841  ?AST '18 17 16 '$ 14*  ?ALT '16 15 14 14  '$ ?ALKPHOS 66 62 57 50  ?BILITOT 0.6 0.6 0.4  0.5  ?PROT 6.9 6.6 6.3* 5.6*  ?ALBUMIN 3.7 3.5 3.3* 3.0*  ? ?No results for input(s): LIPASE, AMYLASE in the last 168 hours. ?No results for input(s): AMMONIA in the last 168 hours. ?Coagulation Profile: ?Recent Labs  ?Lab 07/01/21 ?1159 07/05/21 ?0122  ?INR 1.1 1.0  ? ?Cardiac Enzymes: ?Recent Labs  ?Lab 07/05/21 ?0122  ?CKTOTAL 58  ? ?BNP (last 3 results) ?No results for input(s): PROBNP in the last 8760 hours. ?HbA1C: ?No results for input(s): HGBA1C in the last 72 hours. ?CBG: ?No results for input(s): GLUCAP in the last 168 hours. ?Lipid Profile: ?No results for input(s): CHOL, HDL, LDLCALC, TRIG, CHOLHDL, LDLDIRECT in the last 72 hours. ?Thyroid Function Tests: ?Recent Labs  ?  07/05/21 ?0122  ?TSH 0.850  ? ?Anemia Panel: ?  Recent Labs  ?  07/05/21 ?1051  ?VITAMINB12 472  ?FOLATE 21.8  ?FERRITIN 4*  ?TIBC 511*  ?IRON 13*  ?RETICCTPCT 1.7  ? ?Sepsis Labs: ?No results for input(s): PROCALCITON, LATICACIDVEN in the last 168 hours. ? ?No results found for this or any previous visit (from the past 240 hour(s)).  ? ? ? ? ? ?Radiology Studies: ?No results found. ? ? ? ? ? ?Scheduled Meds: ? docusate sodium  100 mg Oral BID  ? escitalopram  20 mg Oral Daily  ? feeding supplement  1 Container Oral TID BM  ? latanoprost  1 drop Both Eyes QHS  ? lisinopril  10 mg Oral Daily  ? meclizine  25 mg Oral Daily  ? multivitamin with minerals  1 tablet Oral Daily  ? pantoprazole  40 mg Oral Daily  ? polyethylene glycol powder  1 Container Oral Once  ? pravastatin  20 mg Oral q1800  ? tamsulosin  0.4 mg Oral QPC supper  ? ?Continuous Infusions: ? sodium chloride Stopped (07/07/21 0811)  ? sodium chloride    ? ferric gluconate (FERRLECIT) IVPB 250 mg (07/06/21 1610)  ? potassium chloride 10 mEq (07/07/21 1025)  ? ? ? LOS: 2 days  ? ? ?Time spent: 47 minutes spent on chart review, discussion with nursing staff, consultants, updating family and interview/physical exam; more than 50% of that time was spent in counseling and/or  coordination of care. ? ? ? ?Capone Schwinn J [place] DO ?[facility] ?Available via Epic secure chat 7am-7pm ?After these hours, please refer to coverage provider listed on [email or web address] ?07/07/2021, 10:42 AM  ? ?

## 2021-07-07 NOTE — Progress Notes (Signed)
Speech Language Pathology Treatment: Dysphagia  ?Patient Details ?Name: SUDEEP SCHEIBEL ?MRN: [id number] ?DOB: 1940-11-15 ?Today's Date: 07/07/2021 ?Time: 1995-7900 ?SLP Time Calculation (min) (ACUTE ONLY): 10 min ? ?Assessment / Plan / Recommendation ?Clinical Impression ? Pt is on a clear liquid diet due to GI issues. He was repositioned upright in bed and independently consumed multiple and sequential straw sips water and Sprite. Pt stated he was hungry and would like breakfast. No concerns of aspiration during session and swallow/respiratory reciprocity was normal throughout. He will be NPO after procedure tomorrow. Pt has all his natural dentition and do not suspect he will have difficulty masticating or oral holding a regular texture diet when approved by MD. He has no history of dysphagia. ST will sign off at this time.  ?  ?HPI HPI: HARBERT FITTERER is an 81 year old male who presented to [facility] ED on 4/29 following unwitnessed fall after return to SNF ~30 minutes after discharge from [facility].  Patient was complaining of left-sided facial pain and left rib pain. Pt was hospitalized at [facility] from 4/26-4/29 for GI bleeding that was treated conservatively.  Ongoing concern for GIB this admission, with GI assessment pending. Pt with past medical history significant for dementia, history of TIA, HTN, HLD, BPH, anxiety/depression, colonic AVMs. ?  ?   ?SLP Plan ? All goals met;Discharge SLP treatment due to (comment) ? ?  ?  ?Recommendations for follow up therapy are one component of a multi-disciplinary discharge planning process, led by the attending physician.  Recommendations may be updated based on patient status, additional functional criteria and insurance authorization. ?  ? ?Recommendations  ?Diet recommendations: Thin liquid;Other(comment) (clear liquids per MD) ?Liquids provided via: Straw;Cup ?Medication Administration: Whole meds with liquid ?Supervision: Patient able to self feed ?Compensations: Slow  rate;Small sips/bites ?Postural Changes and/or Swallow Maneuvers: Seated upright 90 degrees  ?   ?    ?   ? ? ? ? Oral Care Recommendations: Oral care BID ?Follow Up Recommendations: No SLP follow up ?Assistance recommended at discharge: Intermittent Supervision/Assistance ?SLP Visit Diagnosis: Dysphagia, unspecified (R13.10) ?Plan: All goals met;Discharge SLP treatment due to (comment) ? ? ? ? ?  ?  ? ? ?Houston Siren ? ?07/07/2021, 9:32 AM ?

## 2021-07-08 ENCOUNTER — Encounter (HOSPITAL_COMMUNITY)
Admission: EM | Disposition: A | Discharge status: Home Health | Payer: Self-pay | Source: Skilled Nursing Facility | Attending: Internal Medicine

## 2021-07-08 ENCOUNTER — Inpatient Hospital Stay (HOSPITAL_COMMUNITY): Payer: Medicare Other | Admitting: Certified Registered Nurse Anesthetist

## 2021-07-08 DIAGNOSIS — K635 Polyp of colon: Secondary | ICD-10-CM

## 2021-07-08 DIAGNOSIS — E785 Hyperlipidemia, unspecified: Secondary | ICD-10-CM

## 2021-07-08 DIAGNOSIS — D490 Neoplasm of unspecified behavior of digestive system: Secondary | ICD-10-CM

## 2021-07-08 DIAGNOSIS — K640 First degree hemorrhoids: Secondary | ICD-10-CM

## 2021-07-08 DIAGNOSIS — K5521 Angiodysplasia of colon with hemorrhage: Secondary | ICD-10-CM

## 2021-07-08 HISTORY — PX: POLYPECTOMY: SHX5525

## 2021-07-08 HISTORY — PX: HOT HEMOSTASIS: SHX5433

## 2021-07-08 HISTORY — PX: BIOPSY: SHX5522

## 2021-07-08 HISTORY — PX: COLONOSCOPY WITH PROPOFOL: SHX5780

## 2021-07-08 LAB — CBC
HCT: 27 % — ABNORMAL LOW (ref 39.0–52.0)
Hemoglobin: 8.2 g/dL — ABNORMAL LOW (ref 13.0–17.0)
MCH: 22.4 pg — ABNORMAL LOW (ref 26.0–34.0)
MCHC: 30.4 g/dL (ref 30.0–36.0)
MCV: 73.8 fL — ABNORMAL LOW (ref 80.0–100.0)
Platelets: 456 10*3/uL — ABNORMAL HIGH (ref 150–400)
RBC: 3.66 MIL/uL — ABNORMAL LOW (ref 4.22–5.81)
RDW: 17.8 % — ABNORMAL HIGH (ref 11.5–15.5)
WBC: 7.7 10*3/uL (ref 4.0–10.5)
nRBC: 0 % (ref 0.0–0.2)

## 2021-07-08 LAB — BASIC METABOLIC PANEL
Anion gap: 4 — ABNORMAL LOW (ref 5–15)
BUN: 6 mg/dL — ABNORMAL LOW (ref 8–23)
CO2: 25 mmol/L (ref 22–32)
Calcium: 8.8 mg/dL — ABNORMAL LOW (ref 8.9–10.3)
Chloride: 111 mmol/L (ref 98–111)
Creatinine, Ser: 1.13 mg/dL (ref 0.61–1.24)
GFR, Estimated: >60 mL/min (ref >60)
Glucose, Bld: 96 mg/dL (ref 70–99)
Potassium: 4 mmol/L (ref 3.5–5.1)
Sodium: 140 mmol/L (ref 135–145)

## 2021-07-08 LAB — GLUCOSE, CAPILLARY: Glucose-Capillary: 121 mg/dL — ABNORMAL HIGH (ref 70–99)

## 2021-07-08 LAB — MAGNESIUM: Magnesium: 2.3 mg/dL (ref 1.7–2.4)

## 2021-07-08 SURGERY — COLONOSCOPY WITH PROPOFOL
Anesthesia: Monitor Anesthesia Care

## 2021-07-08 MED ORDER — LACTATED RINGERS IV SOLN
INTRAVENOUS | Status: AC | PRN
Start: 2021-07-08 — End: 2021-07-08
  Administered 2021-07-08: 1000 mL via INTRAVENOUS

## 2021-07-08 MED ORDER — PROPOFOL 10 MG/ML IV BOLUS
INTRAVENOUS | Status: DC | PRN
  Administered 2021-07-08: 10 mg via INTRAVENOUS
  Administered 2021-07-08: 20 mg via INTRAVENOUS

## 2021-07-08 MED ORDER — LIDOCAINE 2% (20 MG/ML) 5 ML SYRINGE
INTRAMUSCULAR | Status: DC | PRN
  Administered 2021-07-08: 40 mg via INTRAVENOUS

## 2021-07-08 MED ORDER — PROPOFOL 500 MG/50ML IV EMUL
INTRAVENOUS | Status: DC | PRN
  Administered 2021-07-08: 150 ug/kg/min via INTRAVENOUS

## 2021-07-08 MED ORDER — SODIUM CHLORIDE 0.9 % IV SOLN: INTRAVENOUS | Status: DC

## 2021-07-08 MED ORDER — SODIUM CHLORIDE 0.9 % IV BOLUS
500.0000 mL | Freq: Once | INTRAVENOUS | Status: AC
  Administered 2021-07-08: 500 mL via INTRAVENOUS

## 2021-07-08 MED ORDER — SODIUM CHLORIDE 0.9 % IV BOLUS
500.0000 mL | Freq: Once | INTRAVENOUS | Status: AC
Start: 2021-07-08 — End: 2021-07-08
  Administered 2021-07-08: 500 mL via INTRAVENOUS

## 2021-07-08 SURGICAL SUPPLY — 22 items

## 2021-07-08 NOTE — Anesthesia Procedure Notes (Signed)
Procedure Name: [place] ?Date/Time: 07/08/2021 1:10 PM ?Performed by: Leonor Liv, CRNA ?Pre-anesthesia Checklist: Patient identified, Emergency Drugs available, Suction available, Patient being monitored and Timeout performed ?Patient Re-evaluated:Patient Re-evaluated prior to induction ?Oxygen Delivery Method: Simple face mask ?Placement Confirmation: positive ETCO2 ?Dental Injury: Teeth and Oropharynx as per pre-operative assessment  ? ? ? ? ?

## 2021-07-08 NOTE — Hospital Course (Addendum)
The patient is a 81 year old overweight Caucasian male with past medical history significant for but not limited to dementia, history of TIA, hypertension, hyperlipidemia, BPH, history of anxiety and depression, colonic AVMs as well as other comorbidities presented to [facility] ED on 07/05/2018 following an unwitnessed fall after his return to SNF.  He is complaining about left-sided facial pain and left-sided rib pain.  EMS was activated and brought to the ED for further evaluation.  He was recently discharged from [facility] in which he was hospitalized from 07/01/2021 until 07/04/2021 for GI bleeding and treated conservatively after receiving 1 unit PRBCs.  Hemoglobin at the time of discharge was 9.2 and on the ED of the same arrival to the same evening it was 8.4.  FOBT was positive.  He has a history of colonic AVMs and [facility] was consulted and he underwent a video capsule endoscopy on 07/05/2021 with results of acute bleeding within the colon likely secondary to AVMs.  He is now going for colonoscopy and his colonoscopy had to be delayed until 07/08/2021 because he did not receive his prep the night before last.  Hemoglobin/hematocrit has stabilized and is now 8.2/27.0.  Will await further GI recommendations and evaluation as he is going for colonoscopy. ? ?Colonoscopy done and he had a 10 mm polyp in the sigmoid colon that was removed with a hot snare as well as a 50 mm polyp in the sigmoid colon last pedunculated removed with a hot snare as well as a large polypoid lesion found in the proximal ascending colon with no bleeding present.  Biopsies were taken with a cold forceps histology and there is multiple medium sized patchy annular dysplastic lesions with typical arborization were found in the transverse colon that were treated with argon plasma and 1 area of extensive angiodysplastic tissue that was not ablated due to the size involved area.  There is also 5 sessile and semisessile and  semipedunculated nonbleeding polyps found in the ascending colon.  Patient had internal hemorrhoids found on retroflexion. ? ?Patient's hemoglobin is stable today and he had adenomatous polyp in the proximal colon that needs to be removed with surgery or EMR at tertiary care center and GI to be discussed with the family if they will pursue this.  Overnight his WBC significantly worsened and went to 26.6 will need to continue to monitor closely but it is improving and likely reactive and trending down. GI Cleared the patient for D/C and he is medically stable to D/C Home with Home Health PT/OT.  ?

## 2021-07-08 NOTE — Op Note (Signed)
[facility] ?Patient Name: Matthew Knight ?Procedure Date : 07/08/2021 ?MRN: [id number] ?Attending MD: [name] , MD ?Date of Birth: 1940-05-22 ?CSN: [id number] ?Age: 81 ?Admit Type: Inpatient ?Procedure:                Colonoscopy ?Indications:              Last colonoscopy within the past few months,  ?                          Gastrointestinal bleeding ?Providers:                [name], MD, William Dalton,  ?                          Technician, Jeanella Cara, RN, Charlean Merl  ?                          Purcell Nails, Technician ?Referring MD:             hospital team ?Medicines:                Propofol per Anesthesia, Monitored Anesthesia Care ?Complications:            No immediate complications. ?Estimated Blood Loss:     Estimated blood loss was minimal. ?Procedure:                Pre-Anesthesia Assessment: ?                          - Prior to the procedure, a History and Physical  ?                          was performed, and patient medications and  ?                          allergies were reviewed. The patient's tolerance of  ?                          previous anesthesia was also reviewed. The risks  ?                          and benefits of the procedure and the sedation  ?                          options and risks were discussed with the patient.  ?                          All questions were answered, and informed consent  ?                          was obtained. Prior Anticoagulants: The patient has  ?                          taken no previous anticoagulant or antiplatelet  ?                          agents.  ASA Grade Assessment: III - A patient with  ?                          severe systemic disease. After reviewing the risks  ?                          and benefits, the patient was deemed in  ?                          satisfactory condition to undergo the procedure. ?                          After obtaining informed consent, the colonoscope  ?                           was passed under direct vision. Throughout the  ?                          procedure, the patient's blood pressure, pulse, and  ?                          oxygen saturations were monitored continuously. The  ?                          [id number]-HQ190TL ([id number]) Olympus peds colonoscope was  ?                          introduced through the anus and advanced to the the  ?                          cecum, identified by appendiceal orifice and  ?                          ileocecal valve. The colonoscopy was extremely  ?                          difficult due to poor bowel prep with stool  ?                          present, significant looping and a tortuous colon.  ?                          Successful completion of the procedure was aided by  ?                          straightening and shortening the scope to obtain  ?                          bowel loop reduction, using scope torsion, applying  ?                          abdominal pressure and lavage. The patient  ?  tolerated the procedure fairly well. The quality of  ?                          the bowel preparation was poor. The ileocecal  ?                          valve, appendiceal orifice, and rectum were  ?                          photographed. ?Scope In: 1:19:23 PM ?Scope Out: 1:50:46 PM ?Scope Withdrawal Time: 0 hours 14 minutes 53 seconds  ?Total Procedure Duration: 0 hours 31 minutes 23 seconds  ?Findings: ?     The perianal and digital rectal examinations were normal. ?     A 10 mm polyp was found in the sigmoid colon. The polyp was  ?     semi-pedunculated. The polyp was removed with a hot snare. Resection and  ?     retrieval were complete. Estimated blood loss: none. ?     A 15 mm polyp was found in the sigmoid colon. The polyp was  ?     pedunculated. The polyp was removed with a hot snare. Resection and  ?     retrieval were complete. Estimated blood loss: none. ?     A large polypoid lesion was found in the  proximal ascending colon. The  ?     lesion was polypoid. No bleeding was present. Biopsies were taken with a  ?     cold forceps for histology. Estimated blood loss was minimal. ?     Multiple medium-sized patchy angiodysplastic lesions with typical  ?     arborization were found in the transverse colon, in the ascending colon  ?     and in the cecum. Fulguration to ablate the lesion to prevent bleeding  ?     by argon plasma was successful. ?     One area of extensive angiodysplastic tissue was not ablated due to the  ?     size of the involved area and concern for a possible complication from  ?     fulguration in the proximal colon. ?     Five sessile and semi-sessile and semi-pedunculated, non-bleeding polyps  ?     were found in the ascending colon. The polyps were small in size.  ?     Polypectomy was not attempted due to inadequate bowel preparation and  ?     poor endoscopic visualization. ?     Hemoclip noted in transverse colon (attached to mucosa from previous  ?     polypectomy that has healed). ?     Internal hemorrhoids were found during retroflexion. The hemorrhoids  ?     were medium-sized and Grade I (internal hemorrhoids that do not  ?     prolapse). ?     Copious quantities of liquid semi-liquid semi-solid stool was found in  ?     the sigmoid colon and in the descending colon, precluding visualization. ?Impression:               - Preparation of the colon was poor. ?                          - One 10 mm polyp in the sigmoid  colon, removed  ?                          with a hot snare. Resected and retrieved. ?                          - One 15 mm polyp in the sigmoid colon, removed  ?                          with a hot snare. Resected and retrieved. ?                          - Rule out malignancy, polypoid lesion in the  ?                          proximal ascending colon. Biopsied. ?                          - Multiple colonic angiodysplastic lesions. Treated  ?                          with  argon plasma coagulation (APC). ?                          - Five small, non-bleeding polyps in the ascending  ?                          colon. Resection not attempted. ?                          - Internal hemorrhoids. ?                          - Stool in the sigmoid colon and in the descending  ?                          colon. ?Recommendation:           - Cardiac diet. ?                          - Await pathology results. ?                          - Repeat colonoscopy for surveillance based on  ?                          pathology results. ?                          - Iron supplementation. ?Procedure Code(s):        --- Professional --- ?                          [phone], 75, Colonoscopy, flexible; with control of  ?  bleeding, any method ?                          45385, Colonoscopy, flexible; with removal of  ?                          tumor(s), polyp(s), or other lesion(s) by snare  ?                          technique ?                          45380, 59, Colonoscopy, flexible; with biopsy,  ?                          single or multiple ?Diagnosis Code(s):        --- Professional --- ?                          K92.2, Gastrointestinal hemorrhage, unspecified ?                          K55.20, Angiodysplasia of colon without hemorrhage ?                          D49.0, Neoplasm of unspecified behavior of  ?                          digestive system ?                          K63.5, Polyp of colon ?                          K64.0, First degree hemorrhoids ?CPT copyright 2019 American Medical Association. All rights reserved. ?The codes documented in this report are preliminary and upon coder review may  ?be revised to meet current compliance requirements. ?[name], MD ?07/08/2021 7:38:29 PM ?This report has been signed electronically. ?Number of Addenda: 0 ?

## 2021-07-08 NOTE — Progress Notes (Signed)
Patient refused to finish his bowel prep. This RN tried multiple times to encourage him but he keep saying "that all I can drink". On-call notified but no order given. Patient had 4 large BM. We continue to monitor. ?

## 2021-07-08 NOTE — Progress Notes (Signed)
Physical Therapy Treatment ?Patient Details ?Name: Matthew Knight ?MRN: [id number] ?DOB: [birth year] ?Today's Date: 07/08/2021 ? ? ?History of Present Illness The pt is an 81 yo male presenting 4/29 after sustaining an unwitnessed fall (presumed syncopal episode) ~30 min after being d/c from [facility] ED on 4/29. Pt presented to [facility] ED 4/26-4/29 for GI bleed with hemoglobin to low of 7.6. Plan for capsule endoscopy 5/1. PMH includes: dementia, DM II, hearing loss, and stroke ? ?  ?PT Comments  ? ? Pt seen for session focused on ambulation safety with RW. Pt able to demonstrate increased stability, safety, ability to navigate obstacles in hall and increased endurance with RW use. Pt verbalized understanding of benefits to RW use for safety in light of recent fall history. Pt VSS throughout, slight increase in HR but pt asymptomatic throughout. Pt continues to benefit from skilled PT services to progress toward functional mobility goals.  ? ?HR at rest 71 bpm ?HR max with ambulation 121 bpm ?  ?Recommendations for follow up therapy are one component of a multi-disciplinary discharge planning process, led by the attending physician.  Recommendations may be updated based on patient status, additional functional criteria and insurance authorization. ? ?Follow Up Recommendations ? Home health PT ?  ?  ?Assistance Recommended at Discharge Frequent or constant Supervision/Assistance  ?Patient can return home with the following A little help with walking and/or transfers;A little help with bathing/dressing/bathroom;Direct supervision/assist for medications management;Direct supervision/assist for financial management;Assist for transportation;Help with stairs or ramp for entrance;Assistance with cooking/housework ?  ?Equipment Recommendations ? Rolling walker (2 wheels)  ?  ?Recommendations for Other Services   ? ? ?  ?Precautions / Restrictions Precautions ?Precautions: Fall ?Precaution Comments: pt with multiple recent falls,  watch orthostatics ?Restrictions ?Weight Bearing Restrictions: No  ?  ? ?Mobility ? Bed Mobility ?Overal bed mobility: Modified Independent ?  ?  ?  ?  ?  ?  ?General bed mobility comments: pt OOB on arrival ?  ? ?Transfers ?Overall transfer level: Needs assistance ?Equipment used: Rolling walker (2 wheels), None ?Transfers: Sit to/from Stand ?Sit to Stand: Min guard ?  ?  ?  ?  ?  ?General transfer comment: minG for safety, pt able to maintain static stance without UE support ?  ? ?Ambulation/Gait ?Ambulation/Gait assistance: Min guard, Supervision ?Gait Distance (Feet): 200 Feet ?Assistive device: Rolling walker (2 wheels) ?Gait Pattern/deviations: Step-through pattern ?Gait velocity: slightly ?  ?  ?General Gait Details: noted great improvement with gait on RW, increased stability, increased ability to navitgate obstacles in hall, no drfiting and no LOB ? ? ?Stairs ?  ?  ?  ?  ?  ? ? ?Wheelchair Mobility ?  ? ?Modified Rankin (Stroke Patients Only) ?  ? ? ?  ?Balance Overall balance assessment: Needs assistance ?Sitting-balance support: No upper extremity supported, Feet supported ?Sitting balance-Leahy Scale: Fair ?Sitting balance - Comments: LOB when donning socks ?  ?Standing balance support: No upper extremity supported, During functional activity ?Standing balance-Leahy Scale: Fair ?Standing balance comment: able to mobilize with RW and without short distances ?  ?  ?  ?  ?  ?  ?  ?  ?  ?  ?  ?  ? ?  ?Cognition Arousal/Alertness: Awake/alert ?Behavior During Therapy: [facility] for tasks assessed/performed ?Overall Cognitive Status: History of cognitive impairments - at baseline ?Area of Impairment: Problem solving, Memory, Orientation, Awareness, Following commands ?  ?  ?  ?  ?  ?  ?  ?  ?  Orientation Level: Disoriented to, Place, Time ?  ?Memory: Decreased short-term memory ?Following Commands: Follows one step commands with increased time, Follows one step commands consistently ?  ?Awareness: Emergent ?Problem  Solving: Requires verbal cues ?General Comments: pt with known dementia, poor short term memory in session but able to follow simple cues. ?  ?  ? ?  ?Exercises   ? ?  ?General Comments General comments (skin integrity, edema, etc.): VSS on RA, HR max during ambultaion 121 bpm, 71bpm at rest ?  ?  ? ?Pertinent Vitals/Pain Pain Assessment ?Pain Assessment: No/denies pain  ? ? ?Home Living   ?  ?  ?  ?  ?  ?  ?  ?  ?  ?   ?  ?Prior Function    ?  ?  ?   ? ?PT Goals (current goals can now be found in the care plan section) Acute Rehab PT Goals ?Patient Stated Goal: to get home to his dog ?PT Goal Formulation: With patient ?Time For Goal Achievement: 07/19/21 ? ?  ?Frequency ? ? ? Min 3X/week ? ? ? ?  ?PT Plan    ? ? ?Co-evaluation   ?  ?  ?  ?  ? ?  ?AM-PAC PT "6 Clicks" Mobility   ?Outcome Measure ? Help needed turning from your back to your side while in a flat bed without using bedrails?: None ?Help needed moving from lying on your back to sitting on the side of a flat bed without using bedrails?: A Little ?Help needed moving to and from a bed to a chair (including a wheelchair)?: A Little ?Help needed standing up from a chair using your arms (e.g., wheelchair or bedside chair)?: A Little ?Help needed to walk in hospital room?: A Little ?Help needed climbing 3-5 steps with a railing? : A Little ?6 Click Score: 19 ? ?  ?End of Session Equipment Utilized During Treatment: Gait belt ?Activity Tolerance: Patient tolerated treatment well ?Patient left: in chair;with call bell/phone within reach;with chair alarm set ?Nurse Communication: Mobility status ?PT Visit Diagnosis: Unsteadiness on feet (R26.81);Muscle weakness (generalized) (M62.81) ?  ? ? ?Time: 6578-4696 ?PT Time Calculation (min) (ACUTE ONLY): 14 min ? ?Charges:  $Gait Training: 8-22 mins          ?          ? ?Audry Riles. PTA ?Acute Rehabilitation Services ?Office: [phone] ? ? ? ?Betsey Holiday Maritza Goldsborough ?07/08/2021, 10:12 AM ? ?

## 2021-07-08 NOTE — Plan of Care (Signed)
  Problem: Education: Goal: Knowledge of General Education information will improve Description Including pain rating scale, medication(s)/side effects and non-pharmacologic comfort measures Outcome: Progressing   

## 2021-07-08 NOTE — Anesthesia Preprocedure Evaluation (Addendum)
Anesthesia Evaluation  ?Patient identified by MRN, date of birth, ID band ?Patient awake and Patient confused ? ? ? ?Reviewed: ?Allergy & Precautions, NPO status , Patient's Chart, lab work & pertinent test results ? ?History of Anesthesia Complications ?Negative for: history of anesthetic complications ? ?Airway ?Mallampati: III ? ?TM Distance: >3 FB ?Neck ROM: Full ? ? ? Dental ?no notable dental hx. ? ?  ?Pulmonary ?Current Smoker and Patient abstained from smoking.,  ?  ?Pulmonary exam normal ? ? ? ? ? ? ? Cardiovascular ?hypertension, Pt. on medications ?Normal cardiovascular exam ? ? ?  ?Neuro/Psych ?Anxiety Depression Dementia CVA   ? GI/Hepatic ?Neg liver ROS, GERD  Medicated and Controlled,GIB ?  ?Endo/Other  ?diabetes ? Renal/GU ?Renal InsufficiencyRenal disease  ?negative genitourinary ?  ?Musculoskeletal ?negative musculoskeletal ROS ?(+)  ? Abdominal ?  ?Peds ? Hematology ? ?(+) Blood dyscrasia (Hgb 8.2), anemia ,   ?Anesthesia Other Findings ?Day of surgery medications reviewed with patient. ? Reproductive/Obstetrics ?negative OB ROS ? ?  ? ? ? ? ? ? ? ? ? ? ? ? ? ?  ?  ? ? ? ? ? ? ? ?Anesthesia Physical ?Anesthesia Plan ? ?ASA: 3 ? ?Anesthesia Plan: MAC  ? ?Post-op Pain Management: Minimal or no pain anticipated  ? ?Induction:  ? ?PONV Risk Score and Plan: 0 and Treatment may vary due to age or medical condition and Propofol infusion ? ?Airway Management Planned: Natural Airway and Simple Face Mask ? ?Additional Equipment: None ? ?Intra-op Plan:  ? ?Post-operative Plan:  ? ?Informed Consent: I have reviewed the patients History and Physical, chart, labs and discussed the procedure including the risks, benefits and alternatives for the proposed anesthesia with the patient or authorized representative who has indicated his/her understanding and acceptance.  ? ?Patient has DNR.  ?Discussed DNR with power of attorney, Suspend DNR and Discussed DNR with patient. ?   ?Consent reviewed with POA ? ?Plan Discussed with: CRNA ? ?Anesthesia Plan Comments: (Consent reviewed with patient's daughter via phone. She would like to suspend DNR during procedure. Daiva Huge, MD)  ? ? ? ? ? ?Anesthesia Quick Evaluation ? ?

## 2021-07-08 NOTE — Transfer of Care (Signed)
Immediate Anesthesia Transfer of Care Note ? ?Patient: ALYX GEE ? ?Procedure(s) Performed: COLONOSCOPY WITH PROPOFOL ?POLYPECTOMY ?HOT HEMOSTASIS (ARGON PLASMA COAGULATION/BICAP) ?BIOPSY ? ?Patient Location: Endoscopy Unit ? ?Anesthesia Type:MAC ? ?Level of Consciousness: drowsy ? ?Airway & Oxygen Therapy: Patient Spontanous Breathing and Patient connected to face mask oxygen ? ?Post-op Assessment: Report given to RN and Post -op Vital signs reviewed and stable ? ?Post vital signs: Reviewed and stable ? ?Last Vitals:  ?Vitals Value Taken Time  ?BP 122/40 07/08/21 1359  ?Temp    ?Pulse 73 07/08/21 1400  ?Resp 20 07/08/21 1400  ?SpO2 90 % 07/08/21 1400  ?Vitals shown include unvalidated device data. ? ?Last Pain:  ?Vitals:  ? 07/08/21 1359  ?TempSrc:   ?PainSc: Asleep  ?   ? ?Patients Stated Pain Goal: 0 (07/05/21 2330) ? ?Complications: No notable events documented. ?

## 2021-07-08 NOTE — Anesthesia Postprocedure Evaluation (Signed)
Anesthesia Post Note ? ?Patient: NIRAJ KUDRNA ? ?Procedure(s) Performed: COLONOSCOPY WITH PROPOFOL ?POLYPECTOMY ?HOT HEMOSTASIS (ARGON PLASMA COAGULATION/BICAP) ?BIOPSY ? ?  ? ?Patient location during evaluation: PACU ?Anesthesia Type: MAC ?Level of consciousness: awake and alert ?Pain management: pain level controlled ?Vital Signs Assessment: post-procedure vital signs reviewed and stable ?Respiratory status: spontaneous breathing, nonlabored ventilation and respiratory function stable ?Cardiovascular status: blood pressure returned to baseline ?Postop Assessment: no apparent nausea or vomiting ?Anesthetic complications: no ? ? ?No notable events documented. ? ?Last Vitals:  ?Vitals:  ? 07/08/21 1100 07/08/21 1359  ?BP:  (!) 122/40  ?Pulse:  79  ?Resp:  19  ?Temp: 36.5 ?C   ?SpO2:  96%  ?  ?Last Pain:  ?Vitals:  ? 07/08/21 1359  ?TempSrc:   ?PainSc: Asleep  ? ? ?  ?  ?  ?  ?  ?  ? ?Marthenia Rolling ? ? ? ? ?

## 2021-07-08 NOTE — Progress Notes (Signed)
Occupational Therapy Treatment ?Patient Details ?Name: Matthew Knight ?MRN: [id number] ?DOB: May 07, 1940 ?Today's Date: 07/08/2021 ? ? ?History of present illness The pt is an 81 yo male presenting 4/29 after sustaining an unwitnessed fall (presumed syncopal episode) ~30 min after being d/c from [facility] ED on 4/29. Pt presented to [facility] ED 4/26-4/29 for GI bleed with hemoglobin to low of 7.6. Plan for capsule endoscopy 5/1. PMH includes: dementia, DM II, hearing loss, and stroke ?  ?OT comments ? Session focused on dynamic standing balance challenges to maximize safety when gathering ADL items at ILF due to history of falls. With min guard assist and no DME, pt able to access items reaching to floor, reaching overhead and over to shelving units without LOB. Noted SOB and associated increased HR to 120s with activity. Reinforced energy conservation strategies with emphasis on rest breaks when SOB noted.   ? ?Recommendations for follow up therapy are one component of a multi-disciplinary discharge planning process, led by the attending physician.  Recommendations may be updated based on patient status, additional functional criteria and insurance authorization. ?   ?Follow Up Recommendations ? Home health OT (recommend transition to ALF in future due to cognitive deficits)  ?  ?Assistance Recommended at Discharge Frequent or constant Supervision/Assistance  ?Patient can return home with the following ? A little help with bathing/dressing/bathroom;Assistance with cooking/housework;Direct supervision/assist for medications management;Direct supervision/assist for financial management;Assist for transportation ?  ?Equipment Recommendations ? None recommended by OT  ?  ?Recommendations for Other Services   ? ?  ?Precautions / Restrictions Precautions ?Precautions: Fall ?Precaution Comments: pt with multiple recent falls, watch orthostatics, monitor HR ?Restrictions ?Weight Bearing Restrictions: No  ? ? ?  ? ?Mobility Bed  Mobility ?  ?  ?  ?  ?  ?  ?  ?General bed mobility comments: up in chair ?  ? ?Transfers ?Overall transfer level: Needs assistance ?Equipment used: None ?Transfers: Sit to/from Stand ?Sit to Stand: Supervision ?  ?  ?  ?  ?  ?  ?  ?  ?Balance Overall balance assessment: Needs assistance ?Sitting-balance support: No upper extremity supported, Feet supported ?Sitting balance-Leahy Scale: Good ?  ?  ?Standing balance support: No upper extremity supported, During functional activity ?Standing balance-Leahy Scale: Fair ?Standing balance comment: able to mobilize without AD for short distances ?  ?  ?  ?  ?  ?  ?  ?  ?  ?  ?  ?   ? ?ADL either performed or assessed with clinical judgement  ? ?ADL Overall ADL's : Needs assistance/impaired ?  ?  ?  ?  ?  ?  ?  ?  ?  ?  ?  ?  ?  ?  ?  ?  ?  ?  ?Functional mobility during ADLs: Min guard ?General ADL Comments: Guided pt in dynamic balance challenge assessment gathering ADL items around room (from floor, reaching overhead and up to shelves) with no LOB noted. Pt notably SOB with these activities, able to self implement standing rest break. Reinforced energy conservation strategies ?  ? ?Extremity/Trunk Assessment Upper Extremity Assessment ?Upper Extremity Assessment: Overall WFL for tasks assessed ?  ?Lower Extremity Assessment ?Lower Extremity Assessment: Defer to PT evaluation ?  ?  ?  ? ?Vision   ?Vision Assessment?: No apparent visual deficits ?  ?Perception   ?  ?Praxis   ?  ? ?Cognition Arousal/Alertness: Awake/alert ?Behavior During Therapy: [facility] for tasks assessed/performed ?Overall Cognitive Status: History  of cognitive impairments - at baseline ?Area of Impairment: Problem solving, Memory, Orientation, Awareness, Following commands ?  ?  ?  ?  ?  ?  ?  ?  ?Orientation Level: Disoriented to, Place, Time ?  ?Memory: Decreased short-term memory ?Following Commands: Follows one step commands with increased time, Follows one step commands consistently ?  ?Awareness:  Emergent ?Problem Solving: Requires verbal cues ?General Comments: pt with known dementia, poor short term memory in session but able to follow simple cues. ?  ?  ?   ?Exercises   ? ?  ?Shoulder Instructions   ? ? ?  ?General Comments Hr up to 120s with activity, 2/4 DOE  ? ? ?Pertinent Vitals/ Pain       Pain Assessment ?Pain Assessment: No/denies pain ? ?Home Living   ?  ?  ?  ?  ?  ?  ?  ?  ?  ?  ?  ?  ?  ?  ?  ?  ?  ?  ? ?  ?Prior Functioning/Environment    ?  ?  ?  ?   ? ?Frequency ? Min 2X/week  ? ? ? ? ?  ?Progress Toward Goals ? ?OT Goals(current goals can now be found in the care plan section) ? Progress towards OT goals: Progressing toward goals ? ?Acute Rehab OT Goals ?Patient Stated Goal: avoid falls, go home soon ?OT Goal Formulation: With patient ?Time For Goal Achievement: 07/20/21 ?Potential to Achieve Goals: Good ?ADL Goals ?Pt Will Transfer to Toilet: with modified independence;ambulating ?Additional ADL Goal #1: Pt to complete pill box test for further functional cognitive assessment ?Additional ADL Goal #2: Pt to gather ADL items MOD I without LOB or safety concerns ?Additional ADL Goal #3: Pt to increase activity tolerance > 10 min with stable vitals during functional tasks  ?Plan Discharge plan remains appropriate   ? ?Co-evaluation ? ? ?   ?  ?  ?  ?  ? ?  ?AM-PAC OT "6 Clicks" Daily Activity     ?Outcome Measure ? ? Help from another person eating meals?: None ?Help from another person taking care of personal grooming?: A Little ?Help from another person toileting, which includes using toliet, bedpan, or urinal?: A Little ?Help from another person bathing (including washing, rinsing, drying)?: A Little ?Help from another person to put on and taking off regular upper body clothing?: A Little ?Help from another person to put on and taking off regular lower body clothing?: A Little ?6 Click Score: 19 ? ?  ?End of Session Equipment Utilized During Treatment: Gait belt ? ?OT Visit Diagnosis:  Unsteadiness on feet (R26.81);Other abnormalities of gait and mobility (R26.89);Other symptoms and signs involving cognitive function ?  ?Activity Tolerance Patient tolerated treatment well ?  ?Patient Left in bed;with call bell/phone within reach;with bed alarm set;Other (comment) (DO at bedside) ?  ?Nurse Communication   ?  ? ?   ? ?Time: 6160-7371 ?OT Time Calculation (min): 13 min ? ?Charges: OT General Charges ?$OT Visit: 1 Visit ?OT Treatments ?$Self Care/Home Management : 8-22 mins ? ?Malachy Chamber, OTR/L ?Acute Rehab Services ?Office: [phone]  ? ?Layla Maw ?07/08/2021, 10:35 AM ?

## 2021-07-08 NOTE — Progress Notes (Signed)
?PROGRESS NOTE ? ? ? ?Matthew Knight  PYK:[id number] DOB: 1941/01/29 DOA: 07/04/2021 ?PCP: [name], MD  ? ?Brief Narrative:  ?The patient is a 81 year old overweight Caucasian male with past medical history significant for but not limited to dementia, history of TIA, hypertension, hyperlipidemia, BPH, history of anxiety and depression, colonic AVMs as well as other comorbidities presented to [facility] ED on 07/05/2018 following an unwitnessed fall after his return to SNF.  He is complaining about left-sided facial pain and left-sided rib pain.  EMS was activated and brought to the ED for further evaluation.  He was recently discharged from [facility] in which he was hospitalized from 07/01/2021 until 07/04/2021 for GI bleeding and treated conservatively after receiving 1 unit PRBCs.  Hemoglobin at the time of discharge was 9.2 and on the ED of the same arrival to the same evening it was 8.4.  FOBT was positive.  He has a history of colonic AVMs and [facility] was consulted and he underwent a video capsule endoscopy on 07/05/2021 with results of acute bleeding within the colon likely secondary to AVMs.  He is now going for colonoscopy and his colonoscopy had to be delayed until 07/08/2021 because he did not receive his prep the night before last.  Hemoglobin/hematocrit has stabilized and is now 8.2/27.0.  Will await further GI recommendations and evaluation as he is going for colonoscopy.  ? ?Assessment and Plan: ? ?Acute on chronic blood loss anemia likely secondary to GIB/colonic AVM ?Patient presenting to ED from SNF after discharge same day from [facility] for GI bleeding.  Treated conservatively with receiving 1 unit PRBC.  Hemoglobin at time of discharge was 9.2.  On ED arrival same day in the evening was 8.4.  FOBT positive.  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 ?-Anemia panel done and showed an iron level of 13, U IBC  498, TIBC of 511, saturation ratios of 3%, ferritin level 4, folate level 21.8, and vitamin B12 470 ?-[facility] following, appreciate assistance ?-Hgb 8.4>8.9>7.8>8.4>8.8>7.8>8.4>8.7>8.0 and now Hgb/Hct is 8.4/27.6 -> 8.2/27.6 -> 8.2/27.0 ?-Underwent video capsule endoscopy 4/30 with results of acute bleeding within the colon likely secondary to AVM ?-Patient receiving iron supplementation and received ferric gluconate to 50 mg IV daily for 4 doses ?-Pending colonoscopy, now delayed today 5/3 because did not receive prep the night before last ?-CBC Daily ?  ?Unwitnessed Fall ?Physical Deconditioning ?Orthostatic Hypotension ?-From [facility] independent living. Evaluated by PT/OT with recommendations of return to IDL if has 24-hour support versus SNF placement.  ?-PT/OT recommending home Health PT/OT ?-C/w Orthostatic vital signs daily ?-Likely to return to independent living per daughter as patient has aides available from [place] ?  ?Hypokalemia ?-Potassium 4.0 now . ?-Continue to monitor and trend electrolytes and replete as necessary ?-Repeat CMP in a.m. ?  ?Essential Hypertension ?-Continue with Lisinopril 10 mg p.o. daily ?-Continue to Hold home Imdur 30 mg p.o. daily ?-Continue to monitor blood pressures per protocol ?-Last blood pressure reading was 122/40 ?  ?Hyperlipidemia ?-Continue Pravastatin 20 mg p.o. daily ? ?Thrombocytosis ?-Likely reactive and slightly worsened as platelet count went from 399 -> 447 -> 456 ?-Continue to monitor for signs and symptoms of bleeding ?-Repeat CBC in a.m. ?  ?BPH ?-On Rapaflo at home. ?-C/w Tamsulosin 0.4 mg p.o. daily ?  ?GERD ?-C/w Pantoprazole 40 mg p.o. daily ? ?Glaucoma ?-Continue with latanoprost 1 drop in both eyes nightly ? ?Depression  and Anxiety ?-Continue escitalopram 20 mg p.o. daily ?  ?History of TIA ?-Had been previously on aspirin and Plavix but has been discontinued due to recurrent GI bleed episodes. ?-PT/OT recommending Home Health  ? ?DVT  prophylaxis: SCDs Start: 07/05/21 0315 ? ?  Code Status: DNR ?Family Communication: No family present at bedside  ? ?Disposition Plan:  ?Level of care: Telemetry Medical ?Status is: Inpatient ?Remains inpatient appropriate because: Is getting a Colonoscopy today ?  ?Consultants:  [facility] ? ?Procedures:  ?Capsule endoscopy: Active bleeding: Likely secondary to AVM ?Colonoscopy pending for 5/3  ? ?Antimicrobials:  ?Anti-infectives (From admission, onward)  ? ? None  ? ?  ?  ?Subjective: ?Seen and examined at bedside and he states that he wants a "quarter pounder with large fries."  Denies any chest pain or shortness breath.  About to go for his colonoscopy later today.  No nausea or vomiting.  No other concerns or complaints at this time. ? ?Objective: ?Vitals:  ? 07/08/21 0748 07/08/21 1058 07/08/21 1100 07/08/21 1359  ?BP: (!) 130/40 (!) 160/57  (!) 122/40  ?Pulse: (!) 59 60  79  ?Resp: '17 17  19  '$ ?Temp: 98 ?F (36.7 ?C)  97.7 ?F (36.5 ?C)   ?TempSrc: Oral  Temporal   ?SpO2: 99% 100%  96%  ?Weight:  95.4 kg    ?Height:  6' (1.829 m)    ? ? ?Intake/Output Summary (Last 24 hours) at 07/08/2021 1404 ?Last data filed at 07/08/2021 1353 ?Gross per 24 hour  ?Intake 1896.04 ml  ?Output 800 ml  ?Net 1096.04 ml  ? ?Filed Weights  ? 07/05/21 1047 07/08/21 1058  ?Weight: 95.4 kg 95.4 kg  ? ?Examination: ?Physical Exam: ? ?Constitutional: Well-nourished, well-developed overweight Caucasian male who is pleasantly demented in no acute distress appears calm ?Respiratory: Diminished to auscultation bilaterally with coarse breath sounds, no wheezing, rales, rhonchi or crackles. Normal respiratory effort and patient is not tachypenic. No accessory muscle use.  ?Cardiovascular: RRR, no murmurs / rubs / gallops. S1 and S2 auscultated. No extremity edema.   ?Abdomen: Soft, non-tender, non-distended. Bowel sounds positive.  ?GU: Deferred. ?Musculoskeletal: No clubbing / cyanosis of digits/nails. No joint deformity upper and  lower extremities. ?Neurologic: CN 2-12 grossly intact with no focal deficits. Psychiatric: Normal judgment and insight. Alert and oriented x 3. Normal mood and appropriate affect.  ? ?Data Reviewed: I have personally reviewed following labs and imaging studies ? ?CBC: ?Recent Labs  ?Lab 07/03/21 ?0741 07/04/21 ?0920 07/05/21 ?1051 07/05/21 ?1727 07/06/21 ?6283 07/06/21 ?1629 07/07/21 ?0152 07/08/21 ?1517  ?WBC 8.8   < > 8.6 9.0 7.8  --  7.0 7.7  ?NEUTROABS QUANTITY NOT SUFFICIENT, UNABLE TO PERFORM TEST  --   --   --   --   --   --   --   ?HGB 9.3*   < > 8.4* 8.7* 8.0* 8.4* 8.2* 8.2*  ?HCT 31.3*   < > 27.1* 28.4* 25.8* 27.6* 27.6* 27.0*  ?MCV 75.8*   < > 73.6* 74.3* 74.1*  --  74.4* 73.8*  ?PLT 403*   < > 431* 443* 399  --  447* 456*  ? < > = values in this interval not displayed.  ? ?Basic Metabolic Panel: ?Recent Labs  ?Lab 07/04/21 ?1813 07/05/21 ?0122 07/05/21 ?6160 07/06/21 ?7371 07/07/21 ?0152 07/08/21 ?0626  ?NA 140  --  137 138 139 140  ?K 3.6  --  3.5 3.5 3.3* 4.0  ?CL 105  --  106  105 108 111  ?CO2 27  --  '26 25 25 25  '$ ?GLUCOSE 122*  --  103* 102* 94 96  ?BUN 9  --  10 10 7* 6*  ?CREATININE 1.21  --  1.14 1.24 1.16 1.13  ?CALCIUM 9.1  --  8.4* 8.6* 8.5* 8.8*  ?MG  --  2.1  --  2.1  --  2.3  ?PHOS  --  4.2  --   --   --   --   ? ?GFR: ?Estimated Creatinine Clearance: 62.5 mL/min (by C-G formula based on SCr of 1.13 mg/dL). ?Liver Function Tests: ?Recent Labs  ?Lab 07/02/21 ?0007 07/05/21 ?0122 07/05/21 ?7121  ?AST 17 16 14*  ?ALT '15 14 14  '$ ?ALKPHOS 62 57 50  ?BILITOT 0.6 0.4 0.5  ?PROT 6.6 6.3* 5.6*  ?ALBUMIN 3.5 3.3* 3.0*  ? ?No results for input(s): LIPASE, AMYLASE in the last 168 hours. ?No results for input(s): AMMONIA in the last 168 hours. ?Coagulation Profile: ?Recent Labs  ?Lab 07/05/21 ?0122  ?INR 1.0  ? ?Cardiac Enzymes: ?Recent Labs  ?Lab 07/05/21 ?0122  ?CKTOTAL 58  ? ?BNP (last 3 results) ?No results for input(s): PROBNP in the last 8760 hours. ?HbA1C: ?No results for input(s): HGBA1C in the  last 72 hours. ?CBG: ?No results for input(s): GLUCAP in the last 168 hours. ?Lipid Profile: ?No results for input(s): CHOL, HDL, LDLCALC, TRIG, CHOLHDL, LDLDIRECT in the last 72 hours. ?Thyroid Function

## 2021-07-08 NOTE — Interval H&P Note (Signed)
History and Physical Interval Note: ? ?07/08/2021 ?12:42 PM ? ?Matthew Knight  has presented today for surgery, with the diagnosis of gi bleed.  The various methods of treatment have been discussed with the patient and family. After consideration of risks, benefits and other options for treatment, the patient has consented to  Procedure(s): ?COLONOSCOPY WITH PROPOFOL (N/A) as a surgical intervention.  The patient's history has been reviewed, patient examined, no change in status, stable for surgery.  I have reviewed the patient's chart and labs.  Questions were answered to the patient's satisfaction.   ? ? ?[name] ? ? ?

## 2021-07-08 NOTE — Care Management Important Message (Signed)
Important Message ? ?Patient Details  ?Name: Matthew Knight ?MRN: [id number] ?Date of Birth: 08-Dec-1940 ? ? ?Medicare Important Message Given:  Yes ? ? ? ? ?Liani Caris ?07/08/2021, 3:46 PM ?

## 2021-07-08 NOTE — Progress Notes (Signed)
Pt HR 97-130 sustained for 10-15 notified Raiford Noble Latif DO  new order given.  ?

## 2021-07-09 DIAGNOSIS — D369 Benign neoplasm, unspecified site: Secondary | ICD-10-CM

## 2021-07-09 LAB — CBC WITH DIFFERENTIAL/PLATELET
Abs Immature Granulocytes: 0.28 10*3/uL — ABNORMAL HIGH (ref 0.00–0.07)
Abs Immature Granulocytes: 0.11 10*3/uL — ABNORMAL HIGH (ref 0.00–0.07)
Basophils Absolute: 0.1 10*3/uL (ref 0.0–0.1)
Basophils Absolute: 0.1 10*3/uL (ref 0.0–0.1)
Basophils Relative: 0 %
Basophils Relative: 1 %
Eosinophils Absolute: 0.1 10*3/uL (ref 0.0–0.5)
Eosinophils Absolute: 0.4 10*3/uL (ref 0.0–0.5)
Eosinophils Relative: 0 %
Eosinophils Relative: 2 %
HCT: 26 % — ABNORMAL LOW (ref 39.0–52.0)
HCT: 25.4 % — ABNORMAL LOW (ref 39.0–52.0)
Hemoglobin: 8.1 g/dL — ABNORMAL LOW (ref 13.0–17.0)
Hemoglobin: 7.5 g/dL — ABNORMAL LOW (ref 13.0–17.0)
Immature Granulocytes: 1 %
Immature Granulocytes: 1 %
Lymphocytes Relative: 8 %
Lymphocytes Relative: 12 %
Lymphs Abs: 2.1 10*3/uL (ref 0.7–4.0)
Lymphs Abs: 2.2 10*3/uL (ref 0.7–4.0)
MCH: 22.8 pg — ABNORMAL LOW (ref 26.0–34.0)
MCH: 22.2 pg — ABNORMAL LOW (ref 26.0–34.0)
MCHC: 31.2 g/dL (ref 30.0–36.0)
MCHC: 29.5 g/dL — ABNORMAL LOW (ref 30.0–36.0)
MCV: 73 fL — ABNORMAL LOW (ref 80.0–100.0)
MCV: 75.1 fL — ABNORMAL LOW (ref 80.0–100.0)
Monocytes Absolute: 1.4 10*3/uL — ABNORMAL HIGH (ref 0.1–1.0)
Monocytes Absolute: 1 10*3/uL (ref 0.1–1.0)
Monocytes Relative: 5 %
Monocytes Relative: 5 %
Neutro Abs: 22.7 10*3/uL — ABNORMAL HIGH (ref 1.7–7.7)
Neutro Abs: 15.1 10*3/uL — ABNORMAL HIGH (ref 1.7–7.7)
Neutrophils Relative %: 86 %
Neutrophils Relative %: 79 %
Platelets: 487 10*3/uL — ABNORMAL HIGH (ref 150–400)
Platelets: 436 10*3/uL — ABNORMAL HIGH (ref 150–400)
RBC: 3.56 MIL/uL — ABNORMAL LOW (ref 4.22–5.81)
RBC: 3.38 MIL/uL — ABNORMAL LOW (ref 4.22–5.81)
RDW: 18.5 % — ABNORMAL HIGH (ref 11.5–15.5)
RDW: 18.6 % — ABNORMAL HIGH (ref 11.5–15.5)
WBC: 26.6 10*3/uL — ABNORMAL HIGH (ref 4.0–10.5)
WBC: 18.9 10*3/uL — ABNORMAL HIGH (ref 4.0–10.5)
nRBC: 0 % (ref 0.0–0.2)
nRBC: 0 % (ref 0.0–0.2)

## 2021-07-09 LAB — COMPREHENSIVE METABOLIC PANEL
ALT: 19 U/L (ref 0–44)
AST: 23 U/L (ref 15–41)
Albumin: 3 g/dL — ABNORMAL LOW (ref 3.5–5.0)
Alkaline Phosphatase: 60 U/L (ref 38–126)
Anion gap: 7 (ref 5–15)
BUN: 8 mg/dL (ref 8–23)
CO2: 22 mmol/L (ref 22–32)
Calcium: 8.7 mg/dL — ABNORMAL LOW (ref 8.9–10.3)
Chloride: 110 mmol/L (ref 98–111)
Creatinine, Ser: 1.24 mg/dL (ref 0.61–1.24)
GFR, Estimated: 59 mL/min — ABNORMAL LOW (ref >60)
Glucose, Bld: 113 mg/dL — ABNORMAL HIGH (ref 70–99)
Potassium: 3.8 mmol/L (ref 3.5–5.1)
Sodium: 139 mmol/L (ref 135–145)
Total Bilirubin: 0.6 mg/dL (ref 0.3–1.2)
Total Protein: 5.5 g/dL — ABNORMAL LOW (ref 6.5–8.1)

## 2021-07-09 LAB — PHOSPHORUS: Phosphorus: 3.3 mg/dL (ref 2.5–4.6)

## 2021-07-09 LAB — MAGNESIUM: Magnesium: 2.1 mg/dL (ref 1.7–2.4)

## 2021-07-09 LAB — SURGICAL PATHOLOGY

## 2021-07-09 NOTE — Progress Notes (Signed)
Mobility Specialist Progress Note  ? ? 07/09/21 0838  ?Mobility  ?Activity Ambulated with assistance in hallway  ?Level of Assistance Contact guard assist, steadying assist  ?Assistive Device Front wheel walker  ?Distance Ambulated (ft) 160 ft  ?Activity Response Tolerated well  ?$Mobility charge 1 Mobility  ? ?Pt received in chair and agreeable. C/o feeling a little wobbly upon standing. No complaints on walk. Returned to chair with call bell in reach.   ? ?Hildred Alamin ?Mobility Specialist  ?Primary: 5N M.S. Phone: [phone] ?Secondary: 6N M.S. Phone: [phone] ?  ?

## 2021-07-09 NOTE — Plan of Care (Signed)

## 2021-07-09 NOTE — Progress Notes (Addendum)
[facility] Progress Note ? ?Matthew Knight 81 y.o. May 01, 1940 ? ? ?Subjective: ?Sleeping in chair. Denies abdominal pain. ? ?Objective: ?Vital signs: ?Vitals:  ? 07/09/21 0305 07/09/21 0738  ?BP: 135/63 (!) 149/58  ?Pulse: 80 65  ?Resp: 20 17  ?Temp: 99.1 ?F (37.3 ?C)   ?SpO2: 95% 99%  ? ? ?Physical Exam: ?Gen: elderly, hard of hearing, lethargic, no acute distress  ?HEENT: anicteric sclera ?CV: RRR ?Chest: CTA B ?Abd: soft, nontender, nondistended, +BS ?Ext: no edema ? ?Lab Results: ?Recent Labs  ?  07/08/21 ?7628 07/09/21 ?3151  ?NA 140 139  ?K 4.0 3.8  ?CL 111 110  ?CO2 25 22  ?GLUCOSE 96 113*  ?BUN 6* 8  ?CREATININE 1.13 1.24  ?CALCIUM 8.8* 8.7*  ?MG 2.3 2.1  ?PHOS  --  3.3  ? ?Recent Labs  ?  07/09/21 ?7616  ?AST 23  ?ALT 19  ?ALKPHOS 60  ?BILITOT 0.6  ?PROT 5.5*  ?ALBUMIN 3.0*  ? ?Recent Labs  ?  07/08/21 ?0737 07/09/21 ?1062  ?WBC 7.7 26.6*  ?NEUTROABS  --  22.7*  ?HGB 8.2* 8.1*  ?HCT 27.0* 26.0*  ?MCV 73.8* 73.0*  ?PLT 456* 487*  ? ? ? ? ?Assessment/Plan: ?Obscure overt GI bleed - Hgb stable. 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 Will discuss with family and provide an addendum after talking to his daughter. ? ? ?Matthew Knight ?07/09/2021, 11:38 AM ? ?Questions please call [phone] Patient ID: Matthew Knight, male   DOB: January 17, 1941, 81 y.o.   MRN: [id number] ? ?

## 2021-07-09 NOTE — Progress Notes (Signed)
?PROGRESS NOTE ? ? ? ?Matthew Knight  PQD:[id number] DOB: September 30, 1940 DOA: 07/04/2021 ?PCP: [name], MD  ? ?Brief Narrative:  ?The patient is a 81 year old overweight Caucasian male with past medical history significant for but not limited to dementia, history of TIA, hypertension, hyperlipidemia, BPH, history of anxiety and depression, colonic AVMs as well as other comorbidities presented to [facility] ED on 07/05/2018 following an unwitnessed fall after his return to SNF.  He is complaining about left-sided facial pain and left-sided rib pain.  EMS was activated and brought to the ED for further evaluation.  He was recently discharged from [facility] in which he was hospitalized from 07/01/2021 until 07/04/2021 for GI bleeding and treated conservatively after receiving 1 unit PRBCs.  Hemoglobin at the time of discharge was 9.2 and on the ED of the same arrival to the same evening it was 8.4.  FOBT was positive.  He has a history of colonic AVMs and [facility] was consulted and he underwent a video capsule endoscopy on 07/05/2021 with results of acute bleeding within the colon likely secondary to AVMs.  He is now going for colonoscopy and his colonoscopy had to be delayed until 07/08/2021 because he did not receive his prep the night before last.  Hemoglobin/hematocrit has stabilized and is now 8.2/27.0.  Will await further GI recommendations and evaluation as he is going for colonoscopy. ? ?Colonoscopy done and he had a 10 mm polyp in the sigmoid colon that was removed with a hot snare as well as a 50 mm polyp in the sigmoid colon last pedunculated removed with a hot snare as well as a large polypoid lesion found in the proximal ascending colon with no bleeding present.  Biopsies were taken with a cold forceps histology and there is multiple medium sized patchy annular dysplastic lesions with typical arborization were found in the transverse colon that were treated with argon plasma and 1 area of  extensive angiodysplastic tissue that was not ablated due to the size involved area.  There is also 5 sessile and semisessile and semipedunculated nonbleeding polyps found in the ascending colon.  Patient had internal hemorrhoids found on retroflexion. ? ?Patient's hemoglobin is stable today and he had adenomatous polyp in the proximal colon that needs to be removed with surgery or EMR at tertiary care center and GI to be discussed with the family if they will pursue this.  Overnight his WBC significantly worsened and went to 26.6 will need to continue to monitor closely  ? ?Assessment and Plan: ? ?Acute on chronic blood loss anemia likely secondary to GIB/colonic AVM ?Patient presenting to ED from SNF after discharge same day from [facility] for GI bleeding.  Treated conservatively with receiving 1 unit PRBC.  Hemoglobin at time of discharge was 9.2.  On ED arrival same day in the evening was 8.4.  FOBT positive.  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 ?-Anemia panel done and showed an iron level of 13, U IBC 498, TIBC of 511, saturation ratios of 3%, ferritin level 4, folate level 21.8, and vitamin B12 470 ?-[facility] following, appreciate assistance ?-Hgb 8.4>8.9>7.8>8.4>8.8>7.8>8.4>8.7>8.0 and now Hgb/Hct is 8.4/27.6 -> 8.2/27.6 -> 8.2/27.0 -> 8.1/26.0 -> 7.5/25.4 ?-Underwent video capsule endoscopy 4/30 with results of acute bleeding within the colon likely secondary to AVM ?-Patient receiving iron supplementation and received ferric gluconate to 50 mg IV daily for 4 doses ?-Colonoscopy done and showed he had  a 10 mm polyp in the sigmoid colon that was removed with a hot snare as well as a 15 mm polyp in the sigmoid colon last pedunculated removed with a hot snare as well as a large adenomatous polypoid lesion found in the proximal ascending colon with no bleeding present.  Biopsies were taken with a cold forceps histology  and there is multiple medium sized patchy annular dysplastic lesions with typical arborization were found in the transverse colon that were treated with argon plasma and 1 area of extensive angiodysplastic tissue that was not ablated due to the size involved area.  ?-There was also 5 sessile and semisessile and semipedunculated nonbleeding polyps found in the ascending colon.  Patient had internal hemorrhoids found on retroflexion. ?-GI felt he had an obscure overt GI bleed and for his adenomatous polyp in the proximal colon that would need to be removed with either surgery or EMR at a tertiary care center as an outpatient family wants to pursue this ?-GI to have further family discussion and talk to the daughter about this ?-Continue to monitor for signs and symptoms of bleeding and his hemoglobin did drop so we will need to continue to monitor closely ?-His diet was advanced to carb modified diet by the the gastroenterologist ?  ?Unwitnessed Fall ?Physical Deconditioning ?Orthostatic Hypotension ?-From [facility] independent living. Evaluated by PT/OT with recommendations of return to IDL if has 24-hour support versus SNF placement.  ?-PT/OT recommending home Health PT/OT ?-C/w Orthostatic vital signs daily ?-Likely to return to independent living per daughter as patient has aides available from [place] ?  ?Hypokalemia ?-Potassium 4.0 yesterday and today it is 3.8 ?-Continue to monitor and trend electrolytes and replete as necessary ?-Repeat CMP in a.m. ? ?Leukocytosis ?-Likely reactive in the setting of Colonoscopy and Polyp Removal/APC ?-WBC went from 7.7 -> 26.6 -> 18.9 ?-Avoid Abx at this Time as it is felt to be reactive ?-If continues to worsen will initiate infectious workup ?-Continue to Monitor and Trend ?-Repeat CBC in the AM ?  ?Essential Hypertension ?-Continue with Lisinopril 10 mg p.o. daily ?-Continue to Hold home Imdur 30 mg p.o. daily ?-Continue to monitor blood pressures per protocol ?-Last blood  pressure reading was 146/63 ?  ?Hyperlipidemia ?-Continue Pravastatin 20 mg p.o. daily ?  ?Thrombocytosis ?-Likely reactive and slightly worsened as platelet count went from 399 -> 447 -> 456 -> 487 -> 436 ?-Continue to monitor for signs and symptoms of bleeding ?-Repeat CBC in a.m. ?  ?BPH ?-On Rapaflo at home. ?-C/w Tamsulosin 0.4 mg p.o. daily ?  ?GERD ?-C/w Pantoprazole 40 mg p.o. daily ?  ?Glaucoma ?-Continue with latanoprost 1 drop in both eyes nightly ?  ?Depression and Anxiety ?-Continue escitalopram 20 mg p.o. daily ?  ?History of TIA ?-Had been previously on aspirin and Plavix but has been discontinued due to recurrent GI bleed episodes. ?-PT/OT recommending Home Health  ? ?DVT prophylaxis: SCDs Start: 07/05/21 0315 ? ?  Code Status: DNR ?Family Communication: No family present at bedside  ? ?Disposition Plan:  ?Level of care: Telemetry Medical ?Status is: Inpatient ?Remains inpatient appropriate because: Needs further clinical improvement and clearance by gastroenterology and discussion about his adenomatous polyp ?  ?Consultants:  ?Gastroenterology ? ?Procedures:  ?COLONOSCOPY ?Findings: ?     The perianal and digital rectal examinations were normal. ?     A 10 mm polyp was found in the sigmoid colon. The polyp was  ?     semi-pedunculated. The polyp was  removed with a hot snare. Resection and  ?     retrieval were complete. Estimated blood loss: none. ?     A 15 mm polyp was found in the sigmoid colon. The polyp was  ?     pedunculated. The polyp was removed with a hot snare. Resection and  ?     retrieval were complete. Estimated blood loss: none. ?     A large polypoid lesion was found in the proximal ascending colon. The  ?     lesion was polypoid. No bleeding was present. Biopsies were taken with a  ?     cold forceps for histology. Estimated blood loss was minimal. ?     Multiple medium-sized patchy angiodysplastic lesions with typical  ?     arborization were found in the transverse colon, in the  ascending colon  ?     and in the cecum. Fulguration to ablate the lesion to prevent bleeding  ?     by argon plasma was successful. ?     One area of extensive angiodysplastic tissue was not ablated due to

## 2021-07-10 ENCOUNTER — Other Ambulatory Visit (HOSPITAL_COMMUNITY): Payer: Self-pay

## 2021-07-10 ENCOUNTER — Encounter (HOSPITAL_COMMUNITY): Payer: Self-pay | Admitting: Gastroenterology

## 2021-07-10 LAB — CBC WITH DIFFERENTIAL/PLATELET
Abs Immature Granulocytes: 0.17 10*3/uL — ABNORMAL HIGH (ref 0.00–0.07)
Basophils Absolute: 0.1 10*3/uL (ref 0.0–0.1)
Basophils Relative: 1 %
Eosinophils Absolute: 0.5 10*3/uL (ref 0.0–0.5)
Eosinophils Relative: 3 %
HCT: 26.5 % — ABNORMAL LOW (ref 39.0–52.0)
Hemoglobin: 7.8 g/dL — ABNORMAL LOW (ref 13.0–17.0)
Immature Granulocytes: 1 %
Lymphocytes Relative: 13 %
Lymphs Abs: 2.1 10*3/uL (ref 0.7–4.0)
MCH: 22.2 pg — ABNORMAL LOW (ref 26.0–34.0)
MCHC: 29.4 g/dL — ABNORMAL LOW (ref 30.0–36.0)
MCV: 75.3 fL — ABNORMAL LOW (ref 80.0–100.0)
Monocytes Absolute: 1.1 10*3/uL — ABNORMAL HIGH (ref 0.1–1.0)
Monocytes Relative: 7 %
Neutro Abs: 12 10*3/uL — ABNORMAL HIGH (ref 1.7–7.7)
Neutrophils Relative %: 75 %
Platelets: 469 10*3/uL — ABNORMAL HIGH (ref 150–400)
RBC: 3.52 MIL/uL — ABNORMAL LOW (ref 4.22–5.81)
RDW: 19.5 % — ABNORMAL HIGH (ref 11.5–15.5)
WBC: 15.9 10*3/uL — ABNORMAL HIGH (ref 4.0–10.5)
nRBC: 0.1 % (ref 0.0–0.2)

## 2021-07-10 LAB — COMPREHENSIVE METABOLIC PANEL
ALT: 14 U/L (ref 0–44)
AST: 18 U/L (ref 15–41)
Albumin: 3 g/dL — ABNORMAL LOW (ref 3.5–5.0)
Alkaline Phosphatase: 62 U/L (ref 38–126)
Anion gap: 4 — ABNORMAL LOW (ref 5–15)
BUN: 8 mg/dL (ref 8–23)
CO2: 24 mmol/L (ref 22–32)
Calcium: 8.6 mg/dL — ABNORMAL LOW (ref 8.9–10.3)
Chloride: 110 mmol/L (ref 98–111)
Creatinine, Ser: 1.27 mg/dL — ABNORMAL HIGH (ref 0.61–1.24)
GFR, Estimated: 57 mL/min — ABNORMAL LOW (ref >60)
Glucose, Bld: 110 mg/dL — ABNORMAL HIGH (ref 70–99)
Potassium: 3.6 mmol/L (ref 3.5–5.1)
Sodium: 138 mmol/L (ref 135–145)
Total Bilirubin: 0.5 mg/dL (ref 0.3–1.2)
Total Protein: 5.7 g/dL — ABNORMAL LOW (ref 6.5–8.1)

## 2021-07-10 LAB — PHOSPHORUS: Phosphorus: 2.8 mg/dL (ref 2.5–4.6)

## 2021-07-10 LAB — MAGNESIUM: Magnesium: 2.1 mg/dL (ref 1.7–2.4)

## 2021-07-10 MED ORDER — PANTOPRAZOLE SODIUM 40 MG PO TBEC
40.0000 mg | DELAYED_RELEASE_TABLET | Freq: Every day | ORAL | 0 refills | Status: AC
  Filled 2021-07-10: qty 30, 30d supply, fill #0

## 2021-07-10 MED ORDER — ADULT MULTIVITAMIN W/MINERALS CH
1.0000 | ORAL_TABLET | Freq: Every day | ORAL | 0 refills | Status: DC
  Filled 2021-07-10: qty 30, 30d supply, fill #0

## 2021-07-10 MED ORDER — DSS 100 MG PO CAPS
100.0000 mg | ORAL_CAPSULE | Freq: Two times a day (BID) | ORAL | 0 refills | Status: DC
  Filled 2021-07-10: qty 10, 5d supply, fill #0

## 2021-07-10 NOTE — Progress Notes (Signed)
Patient ID: Matthew Knight, male   DOB: 01-18-1941, 81 y.o.   MRN: [id number] ? ?Discussed pathology results with his daughter, Seth Bake and recommended EMR for the proximal ascending colon polypoid lesion and if that is not possible surgery would be the next step. Recommend that he be referred to [facility] for a consultation to discuss the feasibility of EMR for him and she is agreeable to this plan. Our office will place referral for that. Ok to go home from GI standpoint. F/U with [place] to be scheduled pending [facility] recs. ?

## 2021-07-10 NOTE — Progress Notes (Signed)
Mobility Specialist Progress Note  ? ? 07/10/21 1154  ?Mobility  ?Activity Transferred from bed to chair  ?Level of Assistance Contact guard assist, steadying assist  ?Assistive Device Front wheel walker  ?Distance Ambulated (ft) 5 ft  ?Activity Response Tolerated fair  ?$Mobility charge 1 Mobility  ? ?Pt received in bed and agreeable. Needed constant cueing to wake-up. No complaints. Had void once in chair. Left with call bell in reach and alarm on.  ? ?Hildred Alamin ?Mobility Specialist  ?Primary: 5N M.S. Phone: [phone] ?Secondary: 6N M.S. Phone: [phone] ?  ?

## 2021-07-10 NOTE — Progress Notes (Signed)
Mobility Specialist Progress Note  ? ? 07/10/21 1529  ?Mobility  ?Activity Transferred from chair to bed  ?Level of Assistance Contact guard assist, steadying assist  ?Assistive Device Front wheel walker;None  ?Distance Ambulated (ft) 5 ft  ?Activity Response Tolerated well  ?$Mobility charge 1 Mobility  ? ?Pt received trying to stand up from chair d/t voiding on himself. Pt able to tolerate standing for pericare and getting back to bed with no complaints. Left with bed alarm on and call bell in reach. Pt back to sleep. ? ?Hildred Alamin ?Mobility Specialist  ?Primary: 5N M.S. Phone: [phone] ?Secondary: 6N M.S. Phone: [phone] ?  ?

## 2021-07-10 NOTE — Discharge Summary (Signed)
?Physician Discharge Summary ?  ?Patient: Matthew Knight MRN: [id number] DOB: [birth year]  ?Admit date:     07/04/2021  ?Discharge date: 07/10/21  ?Discharge Physician: Kerney Elbe  ? ?PCP: [name], MD  ? ?Recommendations at discharge:  ? ?Follow up with PCP within 1-2 weeks and repeat CBC,CMP,Mag, Phos within 1 week ?Follow up with Gastroenterology as an outpatient and have referral made to [facility] for EMR ? ?Discharge Diagnoses: ?Principal Problem: ?  Symptomatic anemia ?Active Problems: ?  History of TIA (transient ischemic attack) ?  Benign essential HTN ?  AVM (arteriovenous malformation) of colon, acquired ?  Dementia without behavioral disturbance ([place]) ?  Fall at home, initial encounter ?  Heme positive stool ?  HLD (hyperlipidemia) ?  Rectal bleeding ? ?Resolved Problems: ?  * No resolved hospital problems. * ? ?Hospital Course: ?The patient is a 81 year old overweight Caucasian male with past medical history significant for but not limited to dementia, history of TIA, hypertension, hyperlipidemia, BPH, history of anxiety and depression, colonic AVMs as well as other comorbidities presented to [facility] ED on 07/05/2018 following an unwitnessed fall after his return to SNF.  He is complaining about left-sided facial pain and left-sided rib pain.  EMS was activated and brought to the ED for further evaluation.  He was recently discharged from [facility] in which he was hospitalized from 07/01/2021 until 07/04/2021 for GI bleeding and treated conservatively after receiving 1 unit PRBCs.  Hemoglobin at the time of discharge was 9.2 and on the ED of the same arrival to the same evening it was 8.4.  FOBT was positive.  He has a history of colonic AVMs and [facility] was consulted and he underwent a video capsule endoscopy on 07/05/2021 with results of acute bleeding within the colon likely secondary to AVMs.  He is now going for colonoscopy and his colonoscopy had to be delayed until  07/08/2021 because he did not receive his prep the night before last.  Hemoglobin/hematocrit has stabilized and is now 8.2/27.0.  Will await further GI recommendations and evaluation as he is going for colonoscopy. ? ?Colonoscopy done and he had a 10 mm polyp in the sigmoid colon that was removed with a hot snare as well as a 50 mm polyp in the sigmoid colon last pedunculated removed with a hot snare as well as a large polypoid lesion found in the proximal ascending colon with no bleeding present.  Biopsies were taken with a cold forceps histology and there is multiple medium sized patchy annular dysplastic lesions with typical arborization were found in the transverse colon that were treated with argon plasma and 1 area of extensive angiodysplastic tissue that was not ablated due to the size involved area.  There is also 5 sessile and semisessile and semipedunculated nonbleeding polyps found in the ascending colon.  Patient had internal hemorrhoids found on retroflexion. ? ?Patient's hemoglobin is stable today and he had adenomatous polyp in the proximal colon that needs to be removed with surgery or EMR at tertiary care center and GI to be discussed with the family if they will pursue this.  Overnight his WBC significantly worsened and went to 26.6 will need to continue to monitor closely but it is improving and likely reactive and trending down. GI Cleared the patient for D/C and he is medically stable to D/C Home with Home Health PT/OT.  ? ?Assessment and Plan: ? ?Acute on chronic blood loss anemia likely secondary to GIB/colonic AVM ?Patient  presenting to ED from SNF after discharge same day from [facility] for GI bleeding.  Treated conservatively with receiving 1 unit PRBC.  Hemoglobin at time of discharge was 9.2.  On ED arrival same day in the evening was 8.4.  FOBT positive.  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 ?-Anemia panel done  and showed an iron level of 13, U IBC 498, TIBC of 511, saturation ratios of 3%, ferritin level 4, folate level 21.8, and vitamin B12 470 ?-[facility] following, appreciate assistance ?-Hgb 8.4>8.9>7.8>8.4>8.8>7.8>8.4>8.7>8.0 and now Hgb/Hct is 8.4/27.6 -> 8.2/27.6 -> 8.2/27.0 -> 8.1/26.0 -> 7.5/25.4 ?-Underwent video capsule endoscopy 4/30 with results of acute bleeding within the colon likely secondary to AVM ?-Patient receiving iron supplementation and received ferric gluconate to 50 mg IV daily for 4 doses ?-Colonoscopy done and showed he had a 10 mm polyp in the sigmoid colon that was removed with a hot snare as well as a 15 mm polyp in the sigmoid colon last pedunculated removed with a hot snare as well as a large adenomatous polypoid lesion found in the proximal ascending colon with no bleeding present.  Biopsies were taken with a cold forceps histology and there is multiple medium sized patchy annular dysplastic lesions with typical arborization were found in the transverse colon that were treated with argon plasma and 1 area of extensive angiodysplastic tissue that was not ablated due to the size involved area.  ?-There was also 5 sessile and semisessile and semipedunculated nonbleeding polyps found in the ascending colon.  Patient had internal hemorrhoids found on retroflexion. ?-GI felt he had an obscure overt GI bleed and for his adenomatous polyp in the proximal colon that would need to be removed with either surgery or EMR at a tertiary care center as an outpatient family wants to pursue this and they do ?-GI to have further family discussion and talk to the daughter about this and referral will be made outpatient  ?-Continue to monitor for signs and symptoms of bleeding and his hemoglobin did drop so we will need to continue to monitor closely ?-His diet was advanced to carb modified diet by the the gastroenterologist ?-GI Has cleared the patient for D/C  ?  ?Unwitnessed Fall ?Physical  Deconditioning ?Orthostatic Hypotension ?-From [facility] independent living. Evaluated by PT/OT with recommendations of return to IDL if has 24-hour support versus SNF placement.  ?-PT/OT recommending home Health PT/OT ?-C/w Orthostatic vital signs daily ?-Likely to return to independent living per daughter as patient has aides available from [place] ?  ?Hypokalemia ?-Potassium is 3.6 ?-Continue to monitor and trend electrolytes and replete as necessary ?-Repeat CMP within 1 week  ? ?Renal Insuffiencey  ?-Mild ?-Patient's BUN/Cr went from 6/1.13 -> 8/1.24 -> 8/1.27 ?-Contine to Monitor and Trend and Repeat CMP within 1 week  ?  ?Leukocytosis ?-Likely reactive in the setting of Colonoscopy and Polyp Removal/APC ?-WBC went from 7.7 -> 26.6 -> 18.9 -> 15.9 ?-Avoid Abx at this Time as it is felt to be reactive ?-If continues to worsen will initiate infectious workup ?-Continue to Monitor and Trend ?-Repeat CBC within 1 week  ?  ?Essential Hypertension ?-Continue with Lisinopril 10 mg p.o. daily ?-Continue to Hold home Imdur 30 mg p.o. daily ?-Continue to monitor blood pressures per protocol ?-Last blood pressure reading was 137/58 ?  ?Hyperlipidemia ?-Continue Pravastatin 20 mg p.o. daily ?  ?Thrombocytosis ?-Likely reactive and slightly worsened as platelet count went  from 399 -> 447 -> 456 -> 487 -> 436 -> 469 ?-Continue to monitor for signs and symptoms of bleeding ?-Repeat CBC within 1 week  ?  ?BPH ?-On Rapaflo at home. ?-C/w Tamsulosin 0.4 mg p.o. daily ?  ?GERD ?-C/w Pantoprazole 40 mg p.o. daily ?  ?Glaucoma ?-Continue with latanoprost 1 drop in both eyes nightly ?  ?Depression and Anxiety ?-Continue escitalopram 20 mg p.o. daily ?  ?History of TIA ?-Had been previously on aspirin and Plavix but has been discontinued due to recurrent GI bleed episodes. ?-PT/OT recommending Home Health  ? ?Nutrition Documentation   ? ?Flowsheet Row ED to Hosp-Admission (Current) from 07/04/2021 in [place]  ?Nutrition Problem Increased nutrient needs  ?Etiology acute illness  ?Nutrition Goal Patient will meet greater than or equal to 90% of their needs  ?Interventions [place], MVI  ? ?

## 2021-07-10 NOTE — Plan of Care (Signed)
  Problem: Activity: Goal: Risk for activity intolerance will decrease Outcome: Progressing   Problem: Nutrition: Goal: Adequate nutrition will be maintained Outcome: Progressing   

## 2021-07-10 NOTE — Progress Notes (Signed)
Physical Therapy Treatment ?Patient Details ?Name: Matthew Knight ?MRN: [id number] ?DOB: [birth year] ?Today's Date: 07/10/2021 ? ? ?History of Present Illness The pt is an 81 yo male presenting 4/29 after sustaining an unwitnessed fall (presumed syncopal episode) ~30 min after being d/c from [facility] ED on 4/29. Pt presented to [facility] ED 4/26-4/29 for GI bleed with hemoglobin to low of 7.6. Plan for capsule endoscopy 5/1. PMH includes: dementia, DM II, hearing loss, and stroke ? ?  ?PT Comments  ? ? Pt received asleep in bed but easily woken. Once awake pt agreeable to session with continued focus on safe mobility on RW and initiation of stair training. Pt able to ambulate from room to stairwell with RW with min guard for safety and ascend/descend full flight in stairwell with mod assist for cueing and steadying. Pt without LOB but noted substantial instability with step over step ascent, despite cues for step-to pattern. Unclear if inability to correct from cognition or due to very [place]. More stability with descent as pt able to demonstrate step to pattern. Pt agreeable to time up in chair at end of session. Pt continues to benefit from skilled PT services to progress toward functional mobility goals.  ?  ?Recommendations for follow up therapy are one component of a multi-disciplinary discharge planning process, led by the attending physician.  Recommendations may be updated based on patient status, additional functional criteria and insurance authorization. ? ?Follow Up Recommendations ? Home health PT ?  ?  ?Assistance Recommended at Discharge Frequent or constant Supervision/Assistance  ?Patient can return home with the following A little help with walking and/or transfers;A little help with bathing/dressing/bathroom;Direct supervision/assist for medications management;Direct supervision/assist for financial management;Assist for transportation;Help with stairs or ramp for entrance;Assistance with cooking/housework ?   ?Equipment Recommendations ? Rolling walker (2 wheels)  ?  ?Recommendations for Other Services   ? ? ?  ?Precautions / Restrictions Precautions ?Precautions: Fall ?Precaution Comments: pt with multiple recent falls, watch orthostatics, monitor HR ?Restrictions ?Weight Bearing Restrictions: No  ?  ? ?Mobility ? Bed Mobility ?Overal bed mobility: Modified Independent ?  ?  ?  ?  ?  ?  ?General bed mobility comments: up in chair ?  ? ?Transfers ?Overall transfer level: Needs assistance ?Equipment used: None ?Transfers: Sit to/from Stand ?Sit to Stand: Supervision ?  ?  ?  ?  ?  ?General transfer comment: minG for safety, pt able to maintain static stance without UE support ?  ? ?Ambulation/Gait ?Ambulation/Gait assistance: Min guard, Supervision ?Gait Distance (Feet): 120 Feet ?Assistive device: Rolling walker (2 wheels) ?Gait Pattern/deviations: Step-through pattern ?Gait velocity: slightly decr ?  ?  ?General Gait Details: noted great improvement with gait on RW, increased stability with RW but general unsteadiness still noted ? ? ?Stairs ?Stairs: Yes ?Stairs assistance: Mod assist ?Stair Management: One rail Right, Alternating pattern, Step to pattern, Forwards ?Number of Stairs: 12 (flight) ?General stair comments: alternating pattern up, cues for step to pattern for safety and energy conservation, difficulty correcting and redirecting attention once ascending, able to demonstrate better stabilty with step to pattern descending, very unsteady up, no LOB noted ? ? ?Wheelchair Mobility ?  ? ?Modified Rankin (Stroke Patients Only) ?  ? ? ?  ?Balance Overall balance assessment: Needs assistance ?Sitting-balance support: No upper extremity supported, Feet supported ?Sitting balance-Leahy Scale: Good ?Sitting balance - Comments: LOB when donning socks ?  ?Standing balance support: No upper extremity supported, During functional activity ?Standing balance-Leahy Scale: Fair ?Standing balance  comment: able to mobilize  without AD for short distances ?  ?  ?  ?  ?  ?  ?  ?  ?  ?  ?  ?  ? ?  ?Cognition Arousal/Alertness: Awake/alert ?Behavior During Therapy: [facility] for tasks assessed/performed ?Overall Cognitive Status: History of cognitive impairments - at baseline ?Area of Impairment: Problem solving, Memory, Orientation, Awareness, Following commands ?  ?  ?  ?  ?  ?  ?  ?  ?Orientation Level: Disoriented to, Place, Time ?  ?Memory: Decreased short-term memory ?Following Commands: Follows one step commands with increased time, Follows one step commands consistently ?  ?Awareness: Emergent ?Problem Solving: Requires verbal cues ?General Comments: pt with known dementia, poor short term memory in session but able to follow simple cues. ?  ?  ? ?  ?Exercises   ? ?  ?General Comments   ?  ?  ? ?Pertinent Vitals/Pain Pain Assessment ?Pain Assessment: No/denies pain  ? ? ?Home Living   ?  ?  ?  ?  ?  ?  ?  ?  ?  ?   ?  ?Prior Function    ?  ?  ?   ? ?PT Goals (current goals can now be found in the care plan section) Acute Rehab PT Goals ?Patient Stated Goal: to get home to his dog ?PT Goal Formulation: With patient ?Time For Goal Achievement: 07/19/21 ? ?  ?Frequency ? ? ? Min 3X/week ? ? ? ?  ?PT Plan    ? ? ?Co-evaluation   ?  ?  ?  ?  ? ?  ?AM-PAC PT "6 Clicks" Mobility   ?Outcome Measure ? Help needed turning from your back to your side while in a flat bed without using bedrails?: None ?Help needed moving from lying on your back to sitting on the side of a flat bed without using bedrails?: A Little ?Help needed moving to and from a bed to a chair (including a wheelchair)?: A Little ?Help needed standing up from a chair using your arms (e.g., wheelchair or bedside chair)?: A Little ?Help needed to walk in hospital room?: A Little ?Help needed climbing 3-5 steps with a railing? : A Lot ?6 Click Score: 18 ? ?  ?End of Session Equipment Utilized During Treatment: Gait belt ?Activity Tolerance: Patient tolerated treatment well ?Patient  left: in chair;with call bell/phone within reach;with chair alarm set ?Nurse Communication: Mobility status ?PT Visit Diagnosis: Unsteadiness on feet (R26.81);Muscle weakness (generalized) (M62.81) ?  ? ? ?Time: 3244-0102 ?PT Time Calculation (min) (ACUTE ONLY): 22 min ? ?Charges:  $Gait Training: 8-22 mins          ?          ? ?Audry Riles. PTA ?Acute Rehabilitation Services ?Office: [phone] ? ? ? ?Betsey Holiday Harlyn Rathmann ?07/10/2021, 2:52 PM ? ?

## 2021-07-11 MED ORDER — POTASSIUM CHLORIDE CRYS ER 20 MEQ PO TBCR
20.0000 meq | EXTENDED_RELEASE_TABLET | Freq: Once | ORAL | Status: AC
  Administered 2021-07-11: 20 meq via ORAL
  Filled 2021-07-11: qty 1

## 2021-07-11 NOTE — Discharge Summary (Signed)
?Physician Discharge Summary ?  ?Patient: Matthew Knight MRN: [id number] DOB: [birth year]  ?Admit date:     07/04/2021  ?Discharge date: 07/11/21  ?Discharge Physician: Kerney Elbe  ? ?PCP: [name], MD  ? ?Recommendations at discharge:  ? ?Follow up with PCP within 1-2 weeks and repeat CBC,CMP,Mag, Phos within 1 week ?Follow up with Gastroenterology as an outpatient and have referral made to [facility] for EMR ? ?Discharge Diagnoses: ?Principal Problem: ?  Symptomatic anemia ?Active Problems: ?  History of TIA (transient ischemic attack) ?  Benign essential HTN ?  AVM (arteriovenous malformation) of colon, acquired ?  Dementia without behavioral disturbance ([place]) ?  Fall at home, initial encounter ?  Heme positive stool ?  HLD (hyperlipidemia) ?  Rectal bleeding ? ?Resolved Problems: ?  * No resolved hospital problems. * ? ?Hospital Course: ?The patient is a 81 year old overweight Caucasian male with past medical history significant for but not limited to dementia, history of TIA, hypertension, hyperlipidemia, BPH, history of anxiety and depression, colonic AVMs as well as other comorbidities presented to [facility] ED on 07/05/2018 following an unwitnessed fall after his return to SNF.  He is complaining about left-sided facial pain and left-sided rib pain.  EMS was activated and brought to the ED for further evaluation.  He was recently discharged from [facility] in which he was hospitalized from 07/01/2021 until 07/04/2021 for GI bleeding and treated conservatively after receiving 1 unit PRBCs.  Hemoglobin at the time of discharge was 9.2 and on the ED of the same arrival to the same evening it was 8.4.  FOBT was positive.  He has a history of colonic AVMs and [facility] was consulted and he underwent a video capsule endoscopy on 07/05/2021 with results of acute bleeding within the colon likely secondary to AVMs.  He is now going for colonoscopy and his colonoscopy had to be delayed until  07/08/2021 because he did not receive his prep the night before last.  Hemoglobin/hematocrit has stabilized and is now 8.2/27.0.  Will await further GI recommendations and evaluation as he is going for colonoscopy. ? ?Colonoscopy done and he had a 10 mm polyp in the sigmoid colon that was removed with a hot snare as well as a 50 mm polyp in the sigmoid colon last pedunculated removed with a hot snare as well as a large polypoid lesion found in the proximal ascending colon with no bleeding present.  Biopsies were taken with a cold forceps histology and there is multiple medium sized patchy annular dysplastic lesions with typical arborization were found in the transverse colon that were treated with argon plasma and 1 area of extensive angiodysplastic tissue that was not ablated due to the size involved area.  There is also 5 sessile and semisessile and semipedunculated nonbleeding polyps found in the ascending colon.  Patient had internal hemorrhoids found on retroflexion. ? ?Patient's hemoglobin is stable today and he had adenomatous polyp in the proximal colon that needs to be removed with surgery or EMR at tertiary care center and GI to be discussed with the family if they will pursue this.  Overnight his WBC significantly worsened and went to 26.6 will need to continue to monitor closely but it is improving and likely reactive and trending down. GI Cleared the patient for D/C and he is medically stable to D/C Home with Home Health PT/OT.  ? ?ADDENDUM 07/11/21: Patient was deemed medically stable to be discharged yesterday and several attempts were made  to contact the daughter to inform her he was getting discharged. Son came to pick up the patient yesterday evening but for unclear reasons was told by someone (unclear who relayed this message) that he would not be ready for D/C until 10 am today and left the hospital without the patient. Again the patient remains medically stable for D/C and there have been no acute  changes overnight and will need to follow up with GI in the outpatient setting at [facility] for consideration of EMR and GI Dr. Michail Sermon will be arranging this.  ? ?Assessment and Plan: ? ?Acute on chronic blood loss anemia likely secondary to GIB/colonic AVM ?Patient presenting to ED from SNF after discharge same day from [facility] for GI bleeding.  Treated conservatively with receiving 1 unit PRBC.  Hemoglobin at time of discharge was 9.2.  On ED arrival same day in the evening was 8.4.  FOBT positive.  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 ?-Anemia panel done and showed an iron level of 13, U IBC 498, TIBC of 511, saturation ratios of 3%, ferritin level 4, folate level 21.8, and vitamin B12 470 ?-[facility] following, appreciate assistance ?-Hgb 8.4>8.9>7.8>8.4>8.8>7.8>8.4>8.7>8.0 and now Hgb/Hct is 8.4/27.6 -> 8.2/27.6 -> 8.2/27.0 -> 8.1/26.0 -> 7.5/25.4 on last check  ?-Underwent video capsule endoscopy 4/30 with results of acute bleeding within the colon likely secondary to AVM ?-Patient receiving iron supplementation and received ferric gluconate to 50 mg IV daily for 4 doses ?-Colonoscopy done and showed he had a 10 mm polyp in the sigmoid colon that was removed with a hot snare as well as a 15 mm polyp in the sigmoid colon last pedunculated removed with a hot snare as well as a large adenomatous polypoid lesion found in the proximal ascending colon with no bleeding present.  Biopsies were taken with a cold forceps histology and there is multiple medium sized patchy annular dysplastic lesions with typical arborization were found in the transverse colon that were treated with argon plasma and 1 area of extensive angiodysplastic tissue that was not ablated due to the size involved area.  ?-There was also 5 sessile and semisessile and semipedunculated nonbleeding polyps found in the ascending colon.  Patient had internal  hemorrhoids found on retroflexion. ?-GI felt he had an obscure overt GI bleed and for his adenomatous polyp in the proximal colon that would need to be removed with either surgery or EMR at a tertiary care center as an outpatient family wants to pursue this and they do ?-GI to have further family discussion and talk to the daughter about this and referral will be made outpatient  ?-Continue to monitor for signs and symptoms of bleeding and his hemoglobin did drop so we will need to continue to monitor closely ?-His diet was advanced to carb modified diet by the the gastroenterologist ?-GI Has cleared the patient for D/C  ?  ?Unwitnessed Fall ?Physical Deconditioning ?Orthostatic Hypotension ?-From [facility] independent living. Evaluated by PT/OT with recommendations of return to IDL if has 24-hour support versus SNF placement.  ?-PT/OT recommending home Health PT/OT ?-C/w Orthostatic vital signs daily ?-Likely to return to independent living per daughter as patient has aides available from [place] ?  ?Hypokalemia ?-Potassium is 3.6 ?-Continue to monitor and trend electrolytes and replete as necessary ?-Repeat CMP within 1 week  ? ?Renal Insuffiencey  ?-Mild ?-Patient's BUN/Cr went from 6/1.13 -> 8/1.24 -> 8/1.27 ?-Contine to Monitor  and Trend and Repeat CMP within 1 week  ?  ?Leukocytosis ?-Likely reactive in the setting of Colonoscopy and Polyp Removal/APC ?-WBC went from 7.7 -> 26.6 -> 18.9 -> 15.9 on last check  ?-Avoid Abx at this Time as it is felt to be reactive ?-If continues to worsen will initiate infectious workup ?-Continue to Monitor and Trend ?-Repeat CBC within 1 week  ?  ?Essential Hypertension ?-Continue with Lisinopril 10 mg p.o. daily ?-Continue to Hold home Imdur 30 mg p.o. daily ?-Continue to monitor blood pressures per protocol ?-Last blood pressure reading was 137/58 ?  ?Hyperlipidemia ?-Continue Pravastatin 20 mg p.o. daily ?  ?Thrombocytosis ?-Likely reactive and slightly worsened as platelet  count went from 399 -> 447 -> 456 -> 487 -> 436 -> 469 ?-Continue to monitor for signs and symptoms of bleeding ?-Repeat CBC within 1 week  ?  ?BPH ?-On Rapaflo at home. ?-C/w Tamsulosin 0.4 mg p.o. d

## 2021-07-11 NOTE — Progress Notes (Signed)
PIV removed. Discharge instructions completed. Patient and son verbalized understanding of medication regimen, follow up appointments and discharge instructions. Patient belongings gathered and packed to discharge. Son given TOC medications and wheeled to DC area by tech.  ? ?

## 2021-07-11 NOTE — TOC Transition Note (Addendum)
Transition of Care (TOC) - CM/SW Discharge Note ? ? ?Patient Details  ?Name: Matthew Knight ?MRN: [id number] ?Date of Birth: June 21, 1940 ? ?Transition of Care (TOC) CM/SW Contact:  ?Matthew Crews, RN ?Phone Number: [phone] ?07/11/2021, 9:39 AM ? ? ?Clinical Narrative:    ? ?Noted patient had DC order written yesterday. Spoke with patient's daughter, Matthew Knight, who stated she received a text yesterday afternoon that her dad was ready for DC, however, when her brother showed up to pick him up, he was told that patient would not be ready until after 10 am today. Her brother will pick him up later today. Nursing aware. DC summary and [place] orders for PT/OT faxed to [facility], Dentist for [employer]. Matthew Knight asked if patient needed more person care hours. Patient is followed by [facility] and currently gets 11 hours/week of personal care. [facility] to discuss if additional hours could be provided. No further TOC  needs identified at this time.  ? ?Update: Verified with attending that patient was ready.  ? ?Final next level of care: [place] ?Barriers to Discharge: No Barriers Identified ? ? ?Patient Goals and CMS Choice ?  ?  ?  ? ?Discharge Placement ?  ?           ?  ?  ?  ?  ? ?Discharge Plan and Services ?In-house Referral: Clinical Social Work ?  ?Post Acute Care Choice:  (TBD)          ?  ?  ?  ?  ?  ?HH Arranged: PT, OT ?[place] Agency: Other - See comment Secondary school teacher) ?Date HH Agency Contacted: 07/11/21 ?Time [place]: [phone] ?Representative spoke with at [place]: Rollene Fare (faxed [place] orders and DC summary) ? ?Social Determinants of Health (SDOH) Interventions ?  ? ? ?Readmission Risk Interventions ?   ? View : No data to display.  ?  ?  ?  ? ? ? ? ? ?

## 2021-07-11 NOTE — Progress Notes (Signed)
Picking up DC meds from [place] ? ?

## 2021-08-31 ENCOUNTER — Other Ambulatory Visit: Payer: Self-pay

## 2021-08-31 ENCOUNTER — Emergency Department (HOSPITAL_COMMUNITY)
Admission: EM | Admit: 2021-08-31 | Discharge: 2021-08-31 | Disposition: A | Discharge status: Home / Self Care | Payer: Medicare Other | Attending: Emergency Medicine | Admitting: Emergency Medicine

## 2021-08-31 ENCOUNTER — Emergency Department (HOSPITAL_COMMUNITY): Payer: Medicare Other

## 2021-08-31 ENCOUNTER — Encounter (HOSPITAL_COMMUNITY): Payer: Self-pay

## 2021-08-31 DIAGNOSIS — R410 Disorientation, unspecified: Secondary | ICD-10-CM | POA: Insufficient documentation

## 2021-08-31 DIAGNOSIS — M545 Low back pain, unspecified: Secondary | ICD-10-CM | POA: Insufficient documentation

## 2021-08-31 DIAGNOSIS — R1084 Generalized abdominal pain: Secondary | ICD-10-CM | POA: Insufficient documentation

## 2021-08-31 DIAGNOSIS — F0390 Unspecified dementia without behavioral disturbance: Secondary | ICD-10-CM | POA: Diagnosis not present

## 2021-08-31 LAB — CBC WITH DIFFERENTIAL/PLATELET
Abs Immature Granulocytes: 0.03 10*3/uL (ref 0.00–0.07)
Basophils Absolute: 0.1 10*3/uL (ref 0.0–0.1)
Basophils Relative: 1 %
Eosinophils Absolute: 0.3 10*3/uL (ref 0.0–0.5)
Eosinophils Relative: 3 %
HCT: 43.5 % (ref 39.0–52.0)
Hemoglobin: 13.3 g/dL (ref 13.0–17.0)
Immature Granulocytes: 0 %
Lymphocytes Relative: 23 %
Lymphs Abs: 1.9 10*3/uL (ref 0.7–4.0)
MCH: 25.3 pg — ABNORMAL LOW (ref 26.0–34.0)
MCHC: 30.6 g/dL (ref 30.0–36.0)
MCV: 82.7 fL (ref 80.0–100.0)
Monocytes Absolute: 0.7 10*3/uL (ref 0.1–1.0)
Monocytes Relative: 8 %
Neutro Abs: 5.4 10*3/uL (ref 1.7–7.7)
Neutrophils Relative %: 65 %
Platelets: 323 10*3/uL (ref 150–400)
RBC: 5.26 MIL/uL (ref 4.22–5.81)
RDW: 22.9 % — ABNORMAL HIGH (ref 11.5–15.5)
WBC: 8.4 10*3/uL (ref 4.0–10.5)
nRBC: 0 % (ref 0.0–0.2)

## 2021-08-31 LAB — COMPREHENSIVE METABOLIC PANEL
ALT: 22 U/L (ref 0–44)
AST: 22 U/L (ref 15–41)
Albumin: 4 g/dL (ref 3.5–5.0)
Alkaline Phosphatase: 72 U/L (ref 38–126)
Anion gap: 8 (ref 5–15)
BUN: 15 mg/dL (ref 8–23)
CO2: 24 mmol/L (ref 22–32)
Calcium: 9.5 mg/dL (ref 8.9–10.3)
Chloride: 109 mmol/L (ref 98–111)
Creatinine, Ser: 1.1 mg/dL (ref 0.61–1.24)
GFR, Estimated: >60 mL/min (ref >60)
Glucose, Bld: 96 mg/dL (ref 70–99)
Potassium: 4.1 mmol/L (ref 3.5–5.1)
Sodium: 141 mmol/L (ref 135–145)
Total Bilirubin: 0.7 mg/dL (ref 0.3–1.2)
Total Protein: 7.3 g/dL (ref 6.5–8.1)

## 2021-08-31 LAB — URINALYSIS, ROUTINE W REFLEX MICROSCOPIC
Bilirubin Urine: NEGATIVE
Glucose, UA: NEGATIVE mg/dL
Hgb urine dipstick: NEGATIVE
Ketones, ur: NEGATIVE mg/dL
Leukocytes,Ua: NEGATIVE
Nitrite: NEGATIVE
Protein, ur: NEGATIVE mg/dL
Specific Gravity, Urine: 1.028 (ref 1.005–1.030)
pH: 5 (ref 5.0–8.0)

## 2021-08-31 LAB — LIPASE, BLOOD: Lipase: 56 U/L — ABNORMAL HIGH (ref 11–51)

## 2021-08-31 LAB — TYPE AND SCREEN
ABO/RH(D): O POS
Antibody Screen: NEGATIVE

## 2021-08-31 LAB — TROPONIN I (HIGH SENSITIVITY)
Troponin I (High Sensitivity): 5 ng/L (ref <18)
Troponin I (High Sensitivity): 4 ng/L (ref <18)

## 2021-08-31 LAB — PROTIME-INR
INR: 1.1 (ref 0.8–1.2)
Prothrombin Time: 13.7 seconds (ref 11.4–15.2)

## 2021-08-31 LAB — LACTIC ACID, PLASMA: Lactic Acid, Venous: 1 mmol/L (ref 0.5–1.9)

## 2021-08-31 MED ORDER — OXYCODONE-ACETAMINOPHEN 5-325 MG PO TABS
1.0000 | ORAL_TABLET | Freq: Once | ORAL | Status: AC
  Administered 2021-08-31: 1 via ORAL
  Filled 2021-08-31: qty 1

## 2021-08-31 MED ORDER — HYDROMORPHONE HCL 1 MG/ML IJ SOLN
0.5000 mg | Freq: Once | INTRAMUSCULAR | Status: AC
  Administered 2021-08-31: 0.5 mg via INTRAVENOUS
  Filled 2021-08-31: qty 1

## 2021-08-31 MED ORDER — IOHEXOL 350 MG/ML SOLN
100.0000 mL | Freq: Once | INTRAVENOUS | Status: AC | PRN
  Administered 2021-08-31: 100 mL via INTRAVENOUS

## 2021-08-31 MED ORDER — SODIUM CHLORIDE (PF) 0.9 % IJ SOLN
INTRAMUSCULAR | Status: AC
Start: 2021-08-31 — End: 2021-08-31
  Filled 2021-08-31: qty 50

## 2021-08-31 MED ORDER — SODIUM CHLORIDE 0.9 % IV BOLUS
500.0000 mL | Freq: Once | INTRAVENOUS | Status: AC
  Administered 2021-08-31: 500 mL via INTRAVENOUS

## 2021-08-31 NOTE — ED Notes (Signed)
[facility] called for transport.  

## 2021-08-31 NOTE — ED Notes (Signed)
Report attempted x6 to [facility] at multiple different numbers. No answer.

## 2021-08-31 NOTE — ED Provider Notes (Signed)
[facility]-EMERGENCY DEPT Provider Note   CSN: [id number] Arrival date & time: 08/31/21  1001     History  Chief Complaint  Patient presents with   Back Pain    Matthew Knight is a 81 y.o. male.  Level 5 caveat secondary to dementia.  Patient is brought in by his independent living facility by ambulance for back pain.  Per EMS patient was screaming in pain and so the ambulance was called.  Patient endorses back pain severe.  He does not know when it started or why it is happening.  He does not know if he has fallen.  He denies any chest pain or shortness of breath.  He also denied abdominal pain although had pain with palpation.  He denies any urine symptoms.  The history is provided by the patient and the EMS personnel.  Back Pain Location:  Lumbar spine Pain severity:  Severe Onset quality:  Unable to specify Timing:  Unable to specify Progression:  Unable to specify Relieved by:  None tried Worsened by:  Nothing Ineffective treatments:  None tried      Home Medications Prior to Admission medications   Medication Sig Start Date End Date Taking? Authorizing Provider  acetaminophen (TYLENOL) 325 MG tablet Take 2 tablets (650 mg total) by mouth every 4 (four) hours as needed for mild pain, moderate pain, fever or headache. 03/11/21   Tyrone Nine, MD  Docusate Sodium (DSS) 100 MG CAPS Take 100 mg by mouth 2 (two) times daily. 07/10/21   Sheikh, Omair Latif, DO  escitalopram (LEXAPRO) 20 MG tablet Take 20 mg by mouth daily. 07/11/17   [provider]  folic acid (FOLVITE) 400 MCG tablet Take 400 mcg by mouth daily.    [provider]  isosorbide mononitrate (IMDUR) 30 MG 24 hr tablet Take 30 mg by mouth daily. 04/28/20   [provider]  latanoprost (XALATAN) 0.005 % ophthalmic solution Place 1 drop into both eyes at bedtime.  07/27/17   [provider]  lisinopril (ZESTRIL) 10 MG tablet Take 10 mg by mouth daily. 02/01/20    [provider]  meclizine (ANTIVERT) 25 MG tablet Take 25 mg by mouth daily.    [provider]  Multiple Vitamin (MULTIVITAMIN WITH MINERALS) TABS tablet Take 1 tablet by mouth daily. 07/11/21   Sheikh, Omair Latif, DO  pantoprazole (PROTONIX) 40 MG tablet Take 1 tablet (40 mg total) by mouth daily. 07/11/21   Marguerita Merles Latif, DO  polyethylene glycol (MIRALAX) 17 g packet Take 17 g by mouth daily as needed for moderate constipation. 07/04/21   Meredeth Ide, MD  pravastatin (PRAVACHOL) 20 MG tablet Take 20 mg by mouth daily. 04/28/20   [provider]  RHOPRESSA 0.02 % SOLN Place 1 drop into both eyes daily. 11/15/18   [provider]  silodosin (RAPAFLO) 8 MG CAPS capsule Take 8 mg by mouth daily. 06/13/21   [provider]      Allergies    Darunavir, Exelon [rivastigmine], and Sulfa antibiotics    Review of Systems   Review of Systems  Unable to perform ROS: Dementia  Musculoskeletal:  Positive for back pain.    Physical Exam Updated Vital Signs BP (!) 171/56   Pulse (!) 56   Temp 97.8 F (36.6 C) (Oral)   Resp 20   Ht 6' (1.829 m)   SpO2 100%   BMI 28.52 kg/m  Physical Exam Vitals and nursing note reviewed.  Constitutional:      General: He is in acute distress.     Appearance: Normal appearance. He is well-developed.  HENT:     Head: Normocephalic and atraumatic.  Eyes:     Conjunctiva/sclera: Conjunctivae normal.  Cardiovascular:     Rate and Rhythm: Normal rate and regular rhythm.     Heart sounds: No murmur heard. Pulmonary:     Effort: Pulmonary effort is normal. No respiratory distress.     Breath sounds: Normal breath sounds.  Abdominal:     Palpations: Abdomen is soft.     Tenderness: There is abdominal tenderness (diffuse). There is no guarding or rebound.  Musculoskeletal:        General: No deformity. Normal range of motion.     Cervical back: Neck supple. No tenderness.     Right lower leg: No edema.      Left lower leg: No edema.     Comments: He has diffuse tenderness over his lumbar back possibly worse on the right.  There is no overlying skin changes no step-offs appreciated.  Skin:    General: Skin is warm and dry.     Capillary Refill: Capillary refill takes less than 2 seconds.  Neurological:     General: No focal deficit present.     Mental Status: He is alert. He is disoriented.     Comments: Patient is a week.  He is following commands and able to move his arms and legs without any gross deficits.  Psychiatric:        Mood and Affect: Mood normal.     ED Results / Procedures / Treatments   Labs (all labs ordered are listed, but only abnormal results are displayed) Labs Reviewed  CBC WITH DIFFERENTIAL/PLATELET - Abnormal; Notable for the following components:      Result Value   MCH 25.3 (*)    RDW 22.9 (*)    All other components within normal limits  LIPASE, BLOOD - Abnormal; Notable for the following components:   Lipase 56 (*)    All other components within normal limits  COMPREHENSIVE METABOLIC PANEL  URINALYSIS, ROUTINE W REFLEX MICROSCOPIC  PROTIME-INR  LACTIC ACID, PLASMA  TYPE AND SCREEN  TROPONIN I (HIGH SENSITIVITY)  TROPONIN I (HIGH SENSITIVITY)    EKG EKG Interpretation  Date/Time:  Monday August 31 2021 10:34:44 EDT Ventricular Rate:  52 PR Interval:  180 QRS Duration: 114 QT Interval:  472 QTC Calculation: 439 R Axis:   99 Text Interpretation: Sinus rhythm Borderline intraventricular conduction delay No significant change since prior 4/23 Confirmed by Meridee Score [phone]) on 08/31/2021 10:38:28 AM  Radiology CT Angio Chest/Abd/Pel for Dissection W and/or W/WO  Result Date: 08/31/2021 CLINICAL DATA:  Abdominal pain, back pain EXAM: CT ANGIOGRAPHY CHEST, ABDOMEN AND PELVIS TECHNIQUE: Non-contrast CT of the chest was initially obtained. Multidetector CT imaging through the chest, abdomen and pelvis was performed using the standard protocol during  bolus administration of intravenous contrast. Multiplanar reconstructed images and MIPs were obtained and reviewed to evaluate the vascular anatomy. RADIATION DOSE REDUCTION: This exam was performed according to the departmental dose-optimization program which includes automated exposure control, adjustment of the mA and/or kV according to patient size and/or use of iterative reconstruction technique. CONTRAST:  OMNIPAQUE IOHEXOL 350 MG/ML SOLN COMPARISON:  03/19/2021 FINDINGS: CTA CHEST FINDINGS Cardiovascular: Preferential opacification of the thoracic aorta. No evidence of thoracic aortic aneurysm or dissection. Normal heart size. No pericardial effusion. Thoracic aortic atherosclerosis. Multi vessel coronary  artery atherosclerosis. Mediastinum/Nodes: No enlarged mediastinal, hilar, or axillary lymph nodes. Thyroid gland, trachea, and esophagus demonstrate no significant findings. Lungs/Pleura: No focal consolidation, pleural effusion or pneumothorax. Bilateral partially calcified pleural plaques as can be seen with prior asbestos exposure. Mild bilateral chronic interstitial thickening. 9 mm lingular pulmonary nodule. Musculoskeletal: No acute osseous abnormality. No aggressive osseous lesion. Lower cervical spine degenerative disease with disc height loss and bilateral uncovertebral degenerative changes. Review of the MIP images confirms the above findings. CTA ABDOMEN AND PELVIS FINDINGS VASCULAR Aorta: Normal caliber aorta without aneurysm, dissection, vasculitis or significant stenosis. Abdominal aortic atherosclerosis. Celiac: Patent without evidence of aneurysm, dissection, vasculitis or significant stenosis. SMA: Patent without evidence of aneurysm, dissection, vasculitis or significant stenosis. Renals: Both renal arteries are patent without evidence of aneurysm, dissection, vasculitis, fibromuscular dysplasia or significant stenosis. Duplicated left renal artery. IMA: Patent without evidence of  aneurysm, dissection, vasculitis or significant stenosis. Inflow: Patent without evidence of aneurysm, dissection, vasculitis or significant stenosis. Mild calcified atherosclerotic plaque in the common iliac arteries and bilateral external iliac arteries. Veins: No obvious venous abnormality within the limitations of this arterial phase study. Review of the MIP images confirms the above findings. NON-VASCULAR Hepatobiliary: No focal liver abnormality is seen. No gallstones, gallbladder wall thickening, or biliary dilatation. Pancreas: Unremarkable. No pancreatic ductal dilatation or surrounding inflammatory changes. Spleen: Normal in size without focal abnormality. Adrenals/Urinary Tract: 14 mm right adrenal mass measuring -8 Hounsfield units most consistent with an adrenal adenoma. Kidneys are normal, without renal calculi, focal lesion, or hydronephrosis. Bladder is unremarkable. Stomach/Bowel: Stomach is within normal limits. No evidence of bowel wall thickening, distention, or inflammatory changes. Appendix is normal. Vascular/Lymphatic: Aortic atherosclerosis. No enlarged abdominal or pelvic lymph nodes. Reproductive: Enlarged prostate gland measuring 6.3 x 6.2 x 7.2 cm. Other: No abdominal wall hernia or abnormality. No abdominopelvic ascites. Musculoskeletal: No acute osseous abnormality. No aggressive osseous lesion. Degenerative disease with disc height loss at L4-5. Review of the MIP images confirms the above findings. IMPRESSION: 1. No thoracic or abdominal aortic aneurysm or dissection. 2. Multivessel coronary artery atherosclerosis. 3. Bilateral partially calcified pleural plaques as can be seen with prior asbestos exposure. 4. A 9 mm lingular pulmonary nodule. Consider one of the following in 3 months for both low-risk and high-risk individuals: (a) repeat chest CT, (b) follow-up PET-CT, or (c) tissue sampling. This recommendation follows the consensus statement: Guidelines for Management of  Incidental Pulmonary Nodules Detected on CT Images: From the Fleischner Society 2017; Radiology 2017; 284:228-243. 5. 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 6. Enlarged prostate gland. Electronically Signed   By: Elige Ko M.D.   On: 08/31/2021 13:17    Procedures Procedures    Medications Ordered in ED Medications  HYDROmorphone (DILAUDID) injection 0.5 mg (0.5 mg Intravenous Given 08/31/21 1041)  sodium chloride 0.9 % bolus 500 mL (0 mLs Intravenous Stopped 08/31/21 1130)  sodium chloride (PF) 0.9 % injection (  Given by Other 08/31/21 1251)  iohexol (OMNIPAQUE) 350 MG/ML injection 100 mL (100 mLs Intravenous Contrast Given 08/31/21 1251)  oxyCODONE-acetaminophen (PERCOCET/ROXICET) 5-325 MG per tablet 1 tablet (1 tablet Oral Given 08/31/21 1546)    ED Course/ Medical Decision Making/ A&P Clinical Course as of 08/31/21 Halford Chessman Aug 31, 2021  1112 Patient received pain medications and looks to be resting much more comfortably. [MB]  1328 workup did not show any acute findings to explain patient's back pain. [MB]  1415 Reassessed patient.  He denies any pain currently.  Nursing is going to try to get him up and walk him. [MB]  1515 Patient ambulated with walker.  Will return back to his facility. [MB]    Clinical Course User Index [MB] Terrilee Files, MD                           Medical Decision Making Amount and/or Complexity of Data Reviewed Labs: ordered. Radiology: ordered.  Risk Prescription drug management.  This patient complains of back pain; this involves an extensive number of treatment Options and is a complaint that carries with it a high risk of complications and morbidity. The differential includes musculoskeletal back pain, disc disease, radiculopathy, AAA, perforation, retroperitoneal bleed, pyelonephritis, renal colic  I ordered, reviewed and interpreted labs, which included CBC normal, chemistries and LFTs normal, lipase mildly elevated, urinalysis without signs of  flexion, troponin flat, lactate normal I ordered medication IV pain medication and fluids, oral pain medication and reviewed PMP when indicated. I ordered imaging studies which included CT angio chest abdomen and pelvis and I independently    visualized and interpreted imaging which showed no acute findings Previous records obtained and reviewed in epic including recent PCP visits Cardiac monitoring reviewed, normal sinus rhythm/sinus bradycardia Social determinants considered, patient has dementia   Critical Interventions: None  After the interventions stated above, I reevaluated the patient and found patient be symptomatically improved Admission and further testing considered, no indications for admission or further work-up at this time.  Will return back to facility.  No family available          Final Clinical Impression(s) / ED Diagnoses Final diagnoses:  Acute midline low back pain without sciatica    Rx / DC Orders ED Discharge Orders     None         Terrilee Files, MD 08/31/21 [id number]

## 2022-03-10 ENCOUNTER — Emergency Department (HOSPITAL_COMMUNITY): Payer: Medicare Other

## 2022-03-10 ENCOUNTER — Encounter (HOSPITAL_COMMUNITY): Payer: Self-pay

## 2022-03-10 ENCOUNTER — Inpatient Hospital Stay (HOSPITAL_COMMUNITY): Payer: Medicare Other

## 2022-03-10 ENCOUNTER — Inpatient Hospital Stay (HOSPITAL_COMMUNITY)
Admission: EM | Admit: 2022-03-10 | Discharge: 2022-03-12 | DRG: 193 | Disposition: A | Discharge status: Home Health | Payer: Medicare Other | Attending: Internal Medicine | Admitting: Internal Medicine

## 2022-03-10 DIAGNOSIS — D3501 Benign neoplasm of right adrenal gland: Secondary | ICD-10-CM | POA: Diagnosis present

## 2022-03-10 DIAGNOSIS — Z886 Allergy status to analgesic agent status: Secondary | ICD-10-CM | POA: Diagnosis not present

## 2022-03-10 DIAGNOSIS — E785 Hyperlipidemia, unspecified: Secondary | ICD-10-CM | POA: Diagnosis present

## 2022-03-10 DIAGNOSIS — J9601 Acute respiratory failure with hypoxia: Secondary | ICD-10-CM | POA: Diagnosis present

## 2022-03-10 DIAGNOSIS — Z888 Allergy status to other drugs, medicaments and biological substances status: Secondary | ICD-10-CM | POA: Diagnosis not present

## 2022-03-10 DIAGNOSIS — H9190 Unspecified hearing loss, unspecified ear: Secondary | ICD-10-CM | POA: Diagnosis present

## 2022-03-10 DIAGNOSIS — Z960 Presence of urogenital implants: Secondary | ICD-10-CM | POA: Diagnosis not present

## 2022-03-10 DIAGNOSIS — Z882 Allergy status to sulfonamides status: Secondary | ICD-10-CM

## 2022-03-10 DIAGNOSIS — N179 Acute kidney failure, unspecified: Secondary | ICD-10-CM | POA: Diagnosis present

## 2022-03-10 DIAGNOSIS — F17200 Nicotine dependence, unspecified, uncomplicated: Secondary | ICD-10-CM | POA: Diagnosis present

## 2022-03-10 DIAGNOSIS — F0390 Unspecified dementia without behavioral disturbance: Secondary | ICD-10-CM | POA: Diagnosis present

## 2022-03-10 DIAGNOSIS — K760 Fatty (change of) liver, not elsewhere classified: Secondary | ICD-10-CM | POA: Diagnosis present

## 2022-03-10 DIAGNOSIS — Z79899 Other long term (current) drug therapy: Secondary | ICD-10-CM

## 2022-03-10 DIAGNOSIS — I10 Essential (primary) hypertension: Secondary | ICD-10-CM | POA: Diagnosis present

## 2022-03-10 DIAGNOSIS — Z66 Do not resuscitate: Secondary | ICD-10-CM | POA: Diagnosis present

## 2022-03-10 DIAGNOSIS — Z8673 Personal history of transient ischemic attack (TIA), and cerebral infarction without residual deficits: Secondary | ICD-10-CM

## 2022-03-10 DIAGNOSIS — N401 Enlarged prostate with lower urinary tract symptoms: Secondary | ICD-10-CM | POA: Diagnosis present

## 2022-03-10 DIAGNOSIS — R4182 Altered mental status, unspecified: Principal | ICD-10-CM

## 2022-03-10 DIAGNOSIS — Z833 Family history of diabetes mellitus: Secondary | ICD-10-CM | POA: Diagnosis not present

## 2022-03-10 DIAGNOSIS — K759 Inflammatory liver disease, unspecified: Secondary | ICD-10-CM | POA: Diagnosis present

## 2022-03-10 DIAGNOSIS — Z1152 Encounter for screening for COVID-19: Secondary | ICD-10-CM | POA: Diagnosis not present

## 2022-03-10 DIAGNOSIS — N138 Other obstructive and reflux uropathy: Secondary | ICD-10-CM | POA: Diagnosis present

## 2022-03-10 DIAGNOSIS — R339 Retention of urine, unspecified: Secondary | ICD-10-CM

## 2022-03-10 DIAGNOSIS — Z8249 Family history of ischemic heart disease and other diseases of the circulatory system: Secondary | ICD-10-CM

## 2022-03-10 DIAGNOSIS — J189 Pneumonia, unspecified organism: Principal | ICD-10-CM

## 2022-03-10 DIAGNOSIS — G9341 Metabolic encephalopathy: Secondary | ICD-10-CM | POA: Diagnosis present

## 2022-03-10 DIAGNOSIS — E119 Type 2 diabetes mellitus without complications: Secondary | ICD-10-CM | POA: Diagnosis present

## 2022-03-10 LAB — URINALYSIS, ROUTINE W REFLEX MICROSCOPIC
Bacteria, UA: NONE SEEN
Bilirubin Urine: NEGATIVE
Glucose, UA: NEGATIVE mg/dL
Ketones, ur: NEGATIVE mg/dL
Leukocytes,Ua: NEGATIVE
Nitrite: NEGATIVE
Protein, ur: NEGATIVE mg/dL
Specific Gravity, Urine: 1.02 (ref 1.005–1.030)
pH: 5 (ref 5.0–8.0)

## 2022-03-10 LAB — CBC WITH DIFFERENTIAL/PLATELET
Abs Immature Granulocytes: 0.06 10*3/uL (ref 0.00–0.07)
Basophils Absolute: 0.1 10*3/uL (ref 0.0–0.1)
Basophils Relative: 1 %
Eosinophils Absolute: 0 10*3/uL (ref 0.0–0.5)
Eosinophils Relative: 0 %
HCT: 43.1 % (ref 39.0–52.0)
Hemoglobin: 14.2 g/dL (ref 13.0–17.0)
Immature Granulocytes: 0 %
Lymphocytes Relative: 6 %
Lymphs Abs: 0.9 10*3/uL (ref 0.7–4.0)
MCH: 29.2 pg (ref 26.0–34.0)
MCHC: 32.9 g/dL (ref 30.0–36.0)
MCV: 88.7 fL (ref 80.0–100.0)
Monocytes Absolute: 0.7 10*3/uL (ref 0.1–1.0)
Monocytes Relative: 4 %
Neutro Abs: 14.2 10*3/uL — ABNORMAL HIGH (ref 1.7–7.7)
Neutrophils Relative %: 89 %
Platelets: 302 10*3/uL (ref 150–400)
RBC: 4.86 MIL/uL (ref 4.22–5.81)
RDW: 13 % (ref 11.5–15.5)
WBC: 15.9 10*3/uL — ABNORMAL HIGH (ref 4.0–10.5)
nRBC: 0 % (ref 0.0–0.2)

## 2022-03-10 LAB — RAPID URINE DRUG SCREEN, HOSP PERFORMED
Amphetamines: NOT DETECTED
Barbiturates: NOT DETECTED
Benzodiazepines: NOT DETECTED
Cocaine: NOT DETECTED
Opiates: NOT DETECTED
Tetrahydrocannabinol: NOT DETECTED

## 2022-03-10 LAB — COMPREHENSIVE METABOLIC PANEL
ALT: 39 U/L (ref 0–44)
AST: 28 U/L (ref 15–41)
Albumin: 4.3 g/dL (ref 3.5–5.0)
Alkaline Phosphatase: 64 U/L (ref 38–126)
Anion gap: 11 (ref 5–15)
BUN: 23 mg/dL (ref 8–23)
CO2: 21 mmol/L — ABNORMAL LOW (ref 22–32)
Calcium: 9.1 mg/dL (ref 8.9–10.3)
Chloride: 108 mmol/L (ref 98–111)
Creatinine, Ser: 1.51 mg/dL — ABNORMAL HIGH (ref 0.61–1.24)
GFR, Estimated: 46 mL/min — ABNORMAL LOW (ref >60)
Glucose, Bld: 144 mg/dL — ABNORMAL HIGH (ref 70–99)
Potassium: 4.1 mmol/L (ref 3.5–5.1)
Sodium: 140 mmol/L (ref 135–145)
Total Bilirubin: 1.1 mg/dL (ref 0.3–1.2)
Total Protein: 7.4 g/dL (ref 6.5–8.1)

## 2022-03-10 LAB — LACTIC ACID, PLASMA
Lactic Acid, Venous: 1.3 mmol/L (ref 0.5–1.9)
Lactic Acid, Venous: 1.1 mmol/L (ref 0.5–1.9)

## 2022-03-10 LAB — RESP PANEL BY RT-PCR (RSV, FLU A&B, COVID)  RVPGX2
Influenza A by PCR: NEGATIVE
Influenza B by PCR: NEGATIVE
Resp Syncytial Virus by PCR: NEGATIVE
SARS Coronavirus 2 by RT PCR: NEGATIVE

## 2022-03-10 LAB — LIPASE, BLOOD: Lipase: 31 U/L (ref 11–51)

## 2022-03-10 LAB — AMMONIA: Ammonia: 22 umol/L (ref 9–35)

## 2022-03-10 LAB — TROPONIN I (HIGH SENSITIVITY)
Troponin I (High Sensitivity): 18 ng/L — ABNORMAL HIGH (ref <18)
Troponin I (High Sensitivity): 20 ng/L — ABNORMAL HIGH (ref <18)

## 2022-03-10 LAB — SALICYLATE LEVEL: Salicylate Lvl: <7 mg/dL — ABNORMAL LOW (ref 7.0–30.0)

## 2022-03-10 LAB — ACETAMINOPHEN LEVEL: Acetaminophen (Tylenol), Serum: <10 ug/mL — ABNORMAL LOW (ref 10–30)

## 2022-03-10 LAB — ETHANOL: Alcohol, Ethyl (B): <10 mg/dL (ref <10)

## 2022-03-10 MED ORDER — SODIUM CHLORIDE 0.9 % IV SOLN
500.0000 mg | Freq: Once | INTRAVENOUS | Status: AC
  Administered 2022-03-10: 500 mg via INTRAVENOUS
  Filled 2022-03-10: qty 5

## 2022-03-10 MED ORDER — ENOXAPARIN SODIUM 40 MG/0.4ML IJ SOSY
40.0000 mg | PREFILLED_SYRINGE | INTRAMUSCULAR | Status: DC
  Administered 2022-03-10 – 2022-03-11 (×2): 40 mg via SUBCUTANEOUS
  Filled 2022-03-10 (×2): qty 0.4

## 2022-03-10 MED ORDER — PRAVASTATIN SODIUM 20 MG PO TABS
20.0000 mg | ORAL_TABLET | Freq: Every day | ORAL | Status: DC
  Administered 2022-03-11 – 2022-03-12 (×2): 20 mg via ORAL
  Filled 2022-03-10 (×2): qty 1

## 2022-03-10 MED ORDER — ONDANSETRON HCL 4 MG PO TABS: 4.0000 mg | ORAL_TABLET | Freq: Four times a day (QID) | ORAL | Status: DC | PRN

## 2022-03-10 MED ORDER — TAMSULOSIN HCL 0.4 MG PO CAPS
0.4000 mg | ORAL_CAPSULE | Freq: Every day | ORAL | Status: DC
  Administered 2022-03-11 – 2022-03-12 (×2): 0.4 mg via ORAL
  Filled 2022-03-10 (×2): qty 1

## 2022-03-10 MED ORDER — ISOSORBIDE MONONITRATE ER 30 MG PO TB24
30.0000 mg | ORAL_TABLET | Freq: Every day | ORAL | Status: DC
  Administered 2022-03-11 – 2022-03-12 (×2): 30 mg via ORAL
  Filled 2022-03-10 (×2): qty 1

## 2022-03-10 MED ORDER — SODIUM CHLORIDE 0.9% FLUSH
3.0000 mL | Freq: Two times a day (BID) | INTRAVENOUS | Status: DC
  Administered 2022-03-10 – 2022-03-12 (×3): 3 mL via INTRAVENOUS

## 2022-03-10 MED ORDER — IOHEXOL 300 MG/ML  SOLN
100.0000 mL | Freq: Once | INTRAMUSCULAR | Status: AC | PRN
  Administered 2022-03-10: 100 mL via INTRAVENOUS

## 2022-03-10 MED ORDER — ONDANSETRON HCL 4 MG/2ML IJ SOLN: 4.0000 mg | Freq: Four times a day (QID) | INTRAMUSCULAR | Status: DC | PRN

## 2022-03-10 MED ORDER — SODIUM CHLORIDE 0.9 % IV SOLN
1.0000 g | INTRAVENOUS | Status: DC
  Administered 2022-03-11: 1 g via INTRAVENOUS
  Filled 2022-03-10 (×2): qty 10

## 2022-03-10 MED ORDER — SENNOSIDES-DOCUSATE SODIUM 8.6-50 MG PO TABS: 1.0000 | ORAL_TABLET | Freq: Every evening | ORAL | Status: DC | PRN

## 2022-03-10 MED ORDER — SODIUM CHLORIDE 0.9 % IV SOLN
1.0000 g | Freq: Once | INTRAVENOUS | Status: AC
  Administered 2022-03-10: 1 g via INTRAVENOUS
  Filled 2022-03-10: qty 10

## 2022-03-10 MED ORDER — ACETAMINOPHEN 650 MG RE SUPP: 650.0000 mg | Freq: Four times a day (QID) | RECTAL | Status: DC | PRN

## 2022-03-10 MED ORDER — PANTOPRAZOLE SODIUM 40 MG PO TBEC
40.0000 mg | DELAYED_RELEASE_TABLET | Freq: Every day | ORAL | Status: DC
  Administered 2022-03-11 – 2022-03-12 (×2): 40 mg via ORAL
  Filled 2022-03-10 (×2): qty 1

## 2022-03-10 MED ORDER — ACETAMINOPHEN 325 MG PO TABS
650.0000 mg | ORAL_TABLET | Freq: Four times a day (QID) | ORAL | Status: DC | PRN
  Administered 2022-03-11 – 2022-03-12 (×2): 650 mg via ORAL
  Filled 2022-03-10 (×2): qty 2

## 2022-03-10 MED ORDER — SODIUM CHLORIDE 0.9 % IV SOLN
500.0000 mg | INTRAVENOUS | Status: DC
  Administered 2022-03-11: 500 mg via INTRAVENOUS
  Filled 2022-03-10 (×2): qty 5

## 2022-03-10 MED ORDER — ESCITALOPRAM OXALATE 20 MG PO TABS
20.0000 mg | ORAL_TABLET | Freq: Every day | ORAL | Status: DC
  Administered 2022-03-11 – 2022-03-12 (×2): 20 mg via ORAL
  Filled 2022-03-10: qty 2
  Filled 2022-03-10: qty 1

## 2022-03-10 NOTE — H&P (Signed)
History and Physical    Matthew Knight ZOX:[id number] DOB: Jan 28, 1941 DOA: 03/10/2022  PCP: Matthew Limbo, MD  [facility] from: [facility]  I have personally briefly reviewed patient's old medical records in [place]  Chief Complaint: Abdominal pain  HPI: Matthew Knight is a 82 y.o. male with medical history significant for dementia, history of CVA, HTN, HLD who presented to the ED from [facility] independent living facility for evaluation of confusion and abdominal pain.  History limited from patient due to hypersomnolence and is otherwise obtained from EDP, chart review, and daughter by phone.  Patient currently resides at [facility] independent living facility.  He was apparently noted to have increased confusion from baseline with some abdominal discomfort and therefore was sent to the ED for further evaluation.  He reported urinary urgency without ability to void on his own.  No fevers, chills, diaphoresis, chest pain.  ED Course  Labs/Imaging on admission: I have personally reviewed following labs and imaging studies.  Initial vitals showed BP 137/77, pulse 114, RR 20, temp 98.2 F, SpO2 85% on room air per EDP documentation.  Patient placed on 4 L O2 via [place].  Labs show WBC 15.9, hemoglobin 14.2, platelets 302,000, sodium 140, potassium 4.1, bicarb 21, BUN 23, creatinine 1.51, serum glucose 144, LFTs within normal limits, lipase 31, lactic acid 1.3, ammonia 22, troponin 18.  Urinalysis negative for UTI.  COVID, influenza, RSV PCR negative.  2 view chest x-ray shows changes concerning for left lower lobe and possibly lingular pneumonia.  CT abdomen/pelvis with contrast showed findings suspicious for bladder outlet obstruction with bilateral ureteral dilation with mild right hydronephrosis and minimal collecting system dilation on the left.  Heterogeneity of the prostate noted.  Question of focal dilation of the distal right ureter  in the setting of duplicated right renal collecting system is also seen.  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  Patient was given IV ceftriaxone and azithromycin.  The hospitalist service was consulted to admit for further evaluation and management.  Review of Systems: All systems reviewed and are negative except as documented in history of present illness above.   Past Medical History:  Diagnosis Date   Dementia ([place])    Diabetes mellitus without complication ([place])    Hearing loss    Hepatitis    Small vessel disease ([place])    Stroke [facility])     Past Surgical History:  Procedure Laterality Date   APPENDECTOMY     BIOPSY  07/08/2021   Procedure: BIOPSY;  Surgeon: Matthew Corner, MD;  Location: [place];  Service: Gastroenterology;;   COLONOSCOPY WITH PROPOFOL N/A 03/24/2021   Procedure: COLONOSCOPY WITH PROPOFOL;  Surgeon: Matthew Pole, MD;  Location: [place] [facility];  Service: Endoscopy;  Laterality: N/A;   COLONOSCOPY WITH PROPOFOL N/A 07/08/2021   Procedure: COLONOSCOPY WITH PROPOFOL;  Surgeon: Matthew Corner, MD;  Location: [place];  Service: Gastroenterology;  Laterality: N/A;   ESOPHAGOGASTRODUODENOSCOPY (EGD) WITH PROPOFOL N/A 03/24/2021   Procedure: ESOPHAGOGASTRODUODENOSCOPY (EGD) WITH PROPOFOL;  Surgeon: Matthew Pole, MD;  Location: [place] [facility];  Service: Endoscopy;  Laterality: N/A;   GIVENS CAPSULE STUDY N/A 07/05/2021   Procedure: GIVENS CAPSULE STUDY;  Surgeon: Matthew Corner, MD;  Location: [place];  Service: Gastroenterology;  Laterality: N/A;   HEMOSTASIS CLIP PLACEMENT  03/24/2021   Procedure: HEMOSTASIS CLIP PLACEMENT;  Surgeon: Matthew Pole, MD;  Location: [place];  Service: Endoscopy;;   HOT HEMOSTASIS N/A  03/24/2021   Procedure: HOT HEMOSTASIS (ARGON PLASMA COAGULATION/BICAP);  Surgeon: Matthew Pole, MD;  Location: [facility];  Service: Endoscopy;  Laterality: N/A;   HOT HEMOSTASIS N/A  07/08/2021   Procedure: HOT HEMOSTASIS (ARGON PLASMA COAGULATION/BICAP);  Surgeon: Matthew Corner, MD;  Location: [place];  Service: Gastroenterology;  Laterality: N/A;   POLYPECTOMY  03/24/2021   Procedure: POLYPECTOMY;  Surgeon: Matthew Pole, MD;  Location: [place] ENDOSCOPY;  Service: Endoscopy;;   POLYPECTOMY  07/08/2021   Procedure: POLYPECTOMY;  Surgeon: Matthew Corner, MD;  Location: [facility] ENDOSCOPY;  Service: Gastroenterology;;    Social History:  reports that he has been smoking pipe. He has never used smokeless tobacco. He reports that he does not drink alcohol and does not use drugs.  Allergies  Allergen Reactions   Aspirin Other (See Comments)    "BLEEDS OUT INTERNALLY"   Nsaids Other (See Comments)    NO ANTI-COAGULANTS, EITHER!!!! "BLEEDS OUT INTERNALLY" (ONLY Tylenol is tolerated)   Darunavir Other (See Comments)    Eruption of the skin   Rivastigmine Other (See Comments)    Brand name only for the patch- "otherwise, he will have a violent reaction"   Sulfa Antibiotics Other (See Comments)    States this "made him crazy"    Family History  Problem Relation Age of Onset   Diabetes Mother    Heart disease Father    Memory loss Paternal Grandfather      Prior to Admission medications   Medication Sig Start Date End Date Taking? Authorizing Provider  acetaminophen (TYLENOL) 325 MG tablet Take 2 tablets (650 mg total) by mouth every 4 (four) hours as needed for mild pain, moderate pain, fever or headache. 03/11/21   Matthew Pour, MD  Docusate Sodium (DSS) 100 MG CAPS Take 100 mg by mouth 2 (two) times daily. 07/10/21   Knight, Matthew Latif, DO  escitalopram (LEXAPRO) 20 MG tablet Take 20 mg by mouth daily. 07/11/17   [provider]  folic acid (FOLVITE) 983 MCG tablet Take 400 mcg by mouth daily.    [provider]  isosorbide mononitrate (IMDUR) 30 MG 24 hr tablet Take 30 mg by mouth daily. 04/28/20   [provider]  latanoprost (XALATAN)  0.005 % ophthalmic solution Place 1 drop into both eyes at bedtime.  07/27/17   [provider]  lisinopril (ZESTRIL) 10 MG tablet Take 10 mg by mouth daily. 02/01/20   [provider]  meclizine (ANTIVERT) 25 MG tablet Take 25 mg by mouth daily.    [provider]  Multiple Vitamin (MULTIVITAMIN WITH MINERALS) TABS tablet Take 1 tablet by mouth daily. 07/11/21   Knight, Matthew Latif, DO  pantoprazole (PROTONIX) 40 MG tablet Take 1 tablet (40 mg total) by mouth daily. 07/11/21   Raiford Noble Latif, DO  polyethylene glycol (MIRALAX) 17 g packet Take 17 g by mouth daily as needed for moderate constipation. 07/04/21   [name], MD  pravastatin (PRAVACHOL) 20 MG tablet Take 20 mg by mouth daily. 04/28/20   [provider]  RHOPRESSA 0.02 % SOLN Place 1 drop into both eyes daily. 11/15/18   [provider]  silodosin (RAPAFLO) 8 MG CAPS capsule Take 8 mg by mouth daily. 06/13/21   [provider]    Physical Exam: Vitals:   03/10/22 1501 03/10/22 1502 03/10/22 1817 03/10/22 1852  BP:  (!) 121/98 137/71 125/62  Pulse:  90 88 83  Resp:  '19 18 18  '$ Temp: 98.2  F (36.8 C) 98.2 F (36.8 C)  98 F (36.7 C)  TempSrc: Oral Oral  Oral  SpO2:  96% 92% 93%   Constitutional: Hypersomnolent, difficult to arouse.  Appears comfortable. Eyes:  lids and conjunctivae normal ENMT: Posterior pharynx clear of any exudate or lesions.Normal dentition.  Neck: normal, supple, no masses. Respiratory: clear to auscultation bilaterally, no wheezing, no crackles. Normal respiratory effort while on 4 L O2 via [place] Cardiovascular: Regular rate and rhythm, no murmurs / rubs / gallops. No extremity edema. 2+ pedal pulses. Abdomen: no obvious tenderness to palpation, no masses palpated. Musculoskeletal: no clubbing / cyanosis. No joint deformity upper and lower extremities.  Skin: no rashes, lesions, ulcers. No induration Neurologic:  Sensation intact.  Moves all extremities  spontaneously. Psychiatric: Hypersomnolent  EKG: Not performed.  Assessment/Plan Principal Problem:   Community acquired pneumonia of left lower lobe of lung Active Problems:   Acute respiratory failure with hypoxia (HCC)   BPH with urinary obstruction   AKI (acute kidney injury) ([place])   Benign essential HTN   Dementia without behavioral disturbance ([place])   HLD (hyperlipidemia)   TIERRA DIVELBISS is a 82 y.o. male with medical history significant for dementia, history of CVA, HTN, HLD who is admitted with acute hypoxic respiratory failure due to left lower lobe pneumonia.  Assessment and Plan: * Community acquired pneumonia of left lower lobe of lung -Continue IV ceftriaxone and azithromycin -Follow strep pneumonia urinary antigen -Continue supplemental oxygen and wean as able  Acute respiratory failure with hypoxia (HCC) SpO2 85% on room air on arrival, requiring 4 L via [place] on admission. -Continue antibiotics and wean supplemental O2 as able  BPH with urinary obstruction CT suggestive of bladder outlet obstruction with mild right hydronephrosis.  Question of distal right ureteral dilation. -Foley placed in ED, monitor UOP -Continue Rapaflo -Obtain renal ultrasound  AKI (acute kidney injury) ([place]) Mild with creatinine 1.51 on admission (previously 1.11 12/28/2021).  Likely secondary to urinary retention.  Foley placed in ED. -Repeat labs in a.m.  Benign essential HTN Continue Imdur, hold lisinopril for now.  HLD (hyperlipidemia) Continue statin.  Dementia without behavioral disturbance ([place]) Continue delirium precautions.  DVT prophylaxis: enoxaparin (LOVENOX) injection 40 mg Start: 03/10/22 2200 Code Status: DNR, confirmed by daughter on admission Family Communication: Daughter by phone Disposition Plan: From independent living, dispo pending clinical progress Consults called: None Severity of Illness: The appropriate patient status for this patient is  INPATIENT. Inpatient status is judged to be reasonable and necessary in order to provide the required intensity of service to ensure the patient's safety. The patient's presenting symptoms, physical exam findings, and initial radiographic and laboratory data in the context of their chronic comorbidities is felt to place them at high risk for further clinical deterioration. Furthermore, it is not anticipated that the patient will be medically stable for discharge from the hospital within 2 midnights of admission.   * I certify that at the point of admission it is my clinical judgment that the patient will require inpatient hospital care spanning beyond 2 midnights from the point of admission due to high intensity of service, high risk for further deterioration and high frequency of surveillance required.Zada Finders MD [facility]  If 7PM-7AM, please contact night-coverage [email or web address]  03/10/2022, 8:28 PM

## 2022-03-10 NOTE — Hospital Course (Signed)
Matthew Knight is a 82 y.o. male with medical history significant for dementia, history of CVA, HTN, HLD who is admitted with acute hypoxic respiratory failure due to left lower lobe pneumonia.

## 2022-03-10 NOTE — Assessment & Plan Note (Signed)
CT suggestive of bladder outlet obstruction with mild right hydronephrosis.  Question of distal right ureteral dilation. -Foley placed in ED, monitor UOP -Continue Rapaflo -Obtain renal ultrasound

## 2022-03-10 NOTE — ED Provider Notes (Signed)
Assumed care from Dr. Pearline Cables at 3 PM.  Patient here with abdominal pain, urinary urgency and frequency but concern for retention.  Facility reports that he was more agitated from his baseline and complaining of abdominal pain.  Upon arrival here it appeared that patient sats were 85% on room air but he does not usually wear oxygen.  I independently interpreted patient's labs labs show a mild AKI and a leukocytosis of 15, lactate, ammonia, LFTs, acetaminophen, salicylates are all within normal limits.  I have independently visualized and interpreted pt's images today. CT shows concerns for urinary retention.  Patient otherwise is in no acute distress at this time.  UA results are pending.  A Foley catheter will be placed.  Also patient remains hypoxic without 4 L of oxygen.  Chest x-ray with concern for possible pneumonia.  Patient's urine without evidence of infection today but after Foley catheter was placed over a liter was removed.  Patient is now much more comfortable.  Will cover with Rocephin and azithromycin for concern for pneumonia.  Will admit for further care.   Blanchie Dessert, MD 03/10/22 [id number]

## 2022-03-10 NOTE — ED Triage Notes (Signed)
Pt arrived via EMS from [facility], independent living, hx of dementia. Increasing confusion. Staff went to check on pt today, concerned for urinary retention and abd distention.   87 % RA - placed on 2L [place], does not wear o2 baseline.

## 2022-03-10 NOTE — Assessment & Plan Note (Signed)
-  Continue IV ceftriaxone and azithromycin -Follow strep pneumonia urinary antigen -Continue supplemental oxygen and wean as able

## 2022-03-10 NOTE — Assessment & Plan Note (Addendum)
Continue statin. 

## 2022-03-10 NOTE — ED Provider Triage Note (Signed)
Emergency Medicine Provider Triage Evaluation Note  Matthew Knight , Knight 82 y.o. male  was evaluated in triage.  Patient presenting from independent living.  Facility states that he seems more altered and confused.  This usually happens when he has Knight UTI.  They also noted some abdominal distention and say that he might be constipated as well.  They also report that they believe he is retaining urine.  Patient repeatedly says that he needs to urinate   Review of Systems  Positive:  Negative:   Physical Exam  There were no vitals taken for this visit. Gen:   Awake, no distress   Resp:  Normal effort  MSK:   Moves extremities without difficulty = Other:  Nontender but very distended abdomen  Medical Decision Making  Medically screening exam initiated at 1:17 PM.  Appropriate orders placed.  Matthew Knight was informed that the remainder of the evaluation will be completed by another provider, this initial triage assessment does not replace that evaluation, and the importance of remaining in the ED until their evaluation is complete.   87% on room air.  Patient does not wear oxygen at home.  Placed on 2 L.  Also tachycardic to the 110s.  Normal pressure and respirations. patient moaning and groaning in triage, appears to be uncomfortable but denies any pain   Matthew Rueter A, PA-C 03/10/22 1320

## 2022-03-10 NOTE — Assessment & Plan Note (Signed)
-   Continue delirium precautions 

## 2022-03-10 NOTE — Assessment & Plan Note (Signed)
SpO2 85% on room air on arrival, requiring 4 L via  on admission. -Continue antibiotics and wean supplemental O2 as able

## 2022-03-10 NOTE — Assessment & Plan Note (Signed)
Continue Imdur, hold lisinopril for now.

## 2022-03-10 NOTE — ED Provider Notes (Signed)
[facility] DEPT Provider Note  CSN: [id number] Arrival date & time: 03/10/22 1300  Chief Complaint(s) Altered Mental Status  HPI Matthew Knight is a 82 y.o. male with past medical history as below, significant for dementia, nursing home resident, DM, cva who presents to the ED with complaint of ams, unable to urinate. Pt arrives from nursing facility, per nursing home pt with increased agitation from his baseline, confusion, abd pain. Pt reports he has the urge to urinate but has been unable to do so. He has no n/v, no fevers or chills, no cp or dib, no recent falls. No change to typical PO intake.  Pt hypoxic on arrival, 85% on RA, placed on [facility] w/ improvement    Level 5 caveat, dementia   Past Medical History Past Medical History:  Diagnosis Date   Dementia ([place])    Diabetes mellitus without complication ([place])    Hearing loss    Hepatitis    Small vessel disease ([place])    Stroke [facility])    Patient Active Problem List   Diagnosis Date Noted   Rectal bleeding 07/02/2021   GIB (gastrointestinal bleeding) 07/01/2021   BPH (benign prostatic hyperplasia) 07/01/2021   HLD (hyperlipidemia) 07/01/2021   Iron deficiency anemia due to chronic blood loss    Symptomatic anemia    GI bleed 03/19/2021   Leukocytosis 03/19/2021   Renal insufficiency 03/19/2021   Fall at home, initial encounter 03/19/2021   Heme positive stool    Acute blood loss anemia    Platelet inhibition due to Plavix    Orthostatic hypotension 03/10/2021   Acute left-sided weakness 03/09/2021   Closed fracture distal radius and ulna, left, sequela 03/09/2021   History of TIA (transient ischemic attack) 03/09/2021   Abnormal ECG 04/28/2020   Snoring 04/28/2020   Anxiety 11/22/2018   Dementia without behavioral disturbance ([place]) 11/22/2018   Depression 11/22/2018   Exposure to Agent [facility] 11/22/2018   Small vessel disease, 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   Dizziness 10/24/2018    Benign essential HTN 11/11/2017   Bradycardia 11/11/2017   Near syncope 11/10/2017   Onychomycosis 04/19/2017   Memory loss 01/22/2017   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   Iliac artery aneurysm, 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 Authorizing Provider  acetaminophen (TYLENOL) 325 MG tablet Take 2 tablets (650 mg total) by mouth every 4 (four) hours as needed for mild pain, moderate pain, fever or headache. 03/11/21   [name], MD  Docusate Sodium (DSS) 100 MG CAPS Take 100 mg by mouth 2 (two) times daily. 07/10/21   Sheikh, Omair Latif, DO  escitalopram (LEXAPRO) 20 MG tablet Take 20 mg by mouth daily. 07/11/17   [provider]  folic acid (FOLVITE) 976 MCG tablet Take 400 mcg by mouth daily.    [provider]  isosorbide mononitrate (IMDUR) 30 MG 24 hr tablet Take 30 mg by mouth daily. 04/28/20   [provider]  latanoprost (XALATAN) 0.005 % ophthalmic solution Place 1 drop  into both eyes at bedtime.  07/27/17   [provider]  lisinopril (ZESTRIL) 10 MG tablet Take 10 mg by mouth daily. 02/01/20   [provider]  meclizine (ANTIVERT) 25 MG tablet Take 25 mg by mouth daily.    [provider]  Multiple Vitamin (MULTIVITAMIN WITH MINERALS) TABS tablet Take 1 tablet by mouth daily. 07/11/21   Sheikh, Omair Latif, DO  pantoprazole (PROTONIX) 40 MG tablet Take 1 tablet (40 mg total) by mouth daily.  07/11/21   Raiford Noble Latif, DO  polyethylene glycol (MIRALAX) 17 g packet Take 17 g by mouth daily as needed for moderate constipation. 07/04/21   [name], MD  pravastatin (PRAVACHOL) 20 MG tablet Take 20 mg by mouth daily. 04/28/20   [provider]  RHOPRESSA 0.02 % SOLN Place 1 drop into both eyes daily. 11/15/18   [provider]  silodosin (RAPAFLO) 8 MG CAPS capsule Take 8 mg by mouth daily. 06/13/21   [provider]                                                                                                                                    Past Surgical History Past Surgical History:  Procedure Laterality Date   APPENDECTOMY     BIOPSY  07/08/2021   Procedure: BIOPSY;  Surgeon: Wilford Corner, MD;  Location: [place];  Service: Gastroenterology;;   COLONOSCOPY WITH PROPOFOL N/A 03/24/2021   Procedure: COLONOSCOPY WITH PROPOFOL;  Surgeon: Mauri Pole, MD;  Location: [place] [facility]COPY;  Service: Endoscopy;  Laterality: N/A;   COLONOSCOPY WITH PROPOFOL N/A 07/08/2021   Procedure: COLONOSCOPY WITH PROPOFOL;  Surgeon: Wilford Corner, MD;  Location: [place];  Service: Gastroenterology;  Laterality: N/A;   ESOPHAGOGASTRODUODENOSCOPY (EGD) WITH PROPOFOL N/A 03/24/2021   Procedure: ESOPHAGOGASTRODUODENOSCOPY (EGD) WITH PROPOFOL;  Surgeon: Mauri Pole, MD;  Location: [place] [facility];  Service: Endoscopy;  Laterality: N/A;   GIVENS CAPSULE STUDY N/A 07/05/2021   Procedure: GIVENS CAPSULE STUDY;  Surgeon: Wilford Corner, MD;  Location: [place];  Service: Gastroenterology;  Laterality: N/A;   HEMOSTASIS CLIP PLACEMENT  03/24/2021   Procedure: HEMOSTASIS CLIP PLACEMENT;  Surgeon: Mauri Pole, MD;  Location: [place];  Service: Endoscopy;;   HOT HEMOSTASIS N/A 03/24/2021   Procedure: HOT HEMOSTASIS (ARGON PLASMA COAGULATION/BICAP);  Surgeon: Mauri Pole, MD;  Location: [facility]COPY;  Service: Endoscopy;  Laterality: N/A;    HOT HEMOSTASIS N/A 07/08/2021   Procedure: HOT HEMOSTASIS (ARGON PLASMA COAGULATION/BICAP);  Surgeon: Wilford Corner, MD;  Location: [place];  Service: Gastroenterology;  Laterality: N/A;   POLYPECTOMY  03/24/2021   Procedure: POLYPECTOMY;  Surgeon: Mauri Pole, MD;  Location: [place] [facility]COPY;  Service: Endoscopy;;   POLYPECTOMY  07/08/2021   Procedure: POLYPECTOMY;  Surgeon: Wilford Corner, MD;  Location[facility]COPY;  Service: Gastroenterology;;   Family History Family History  Problem Relation Age of Onset   Diabetes Mother  Heart disease Father    Memory loss Paternal Grandfather     Social History Social History   Tobacco Use   Smoking status: Every Day    Types: Pipe   Smokeless tobacco: Never  Vaping Use   Vaping Use: Never used  Substance Use Topics   Alcohol use: No    Alcohol/week: 0.0 standard drinks of alcohol   Drug use: No   Allergies Darunavir, Exelon [rivastigmine], and Sulfa antibiotics  Review of Systems Review of Systems  Constitutional:  Negative for chills, diaphoresis and fever.  HENT:  Negative for sore throat and trouble swallowing.   Respiratory:  Negative for cough and shortness of breath.   Cardiovascular:  Negative for chest pain and palpitations.  Gastrointestinal:  Positive for abdominal pain. Negative for nausea and vomiting.  Genitourinary:  Positive for decreased urine volume and difficulty urinating. Negative for penile pain.  Neurological:  Negative for numbness and headaches.  Psychiatric/Behavioral:  Positive for agitation and confusion.     Physical Exam Vital Signs  I have reviewed the triage vital signs BP (!) 121/98 (BP Location: Right Arm)   Pulse 90   Temp 98.2 F (36.8 C) (Oral)   Resp 19   SpO2 96%  Physical Exam Vitals and nursing note reviewed.  Constitutional:      General: He is not in acute distress.    Appearance: Normal appearance. He is well-developed. He is not ill-appearing or diaphoretic.   HENT:     Head: Normocephalic and atraumatic.     Right Ear: External ear normal.     Left Ear: External ear normal.     Mouth/Throat:     Mouth: Mucous membranes are moist.  Eyes:     General: No scleral icterus.    Extraocular Movements: Extraocular movements intact.     Pupils: Pupils are equal, round, and reactive to light.  Cardiovascular:     Rate and Rhythm: Normal rate and regular rhythm.     Pulses: Normal pulses.     Heart sounds: Normal heart sounds.  Pulmonary:     Effort: Pulmonary effort is normal. No respiratory distress.     Breath sounds: Normal breath sounds.  Abdominal:     General: Abdomen is flat. There is distension.     Palpations: Abdomen is soft.     Tenderness: There is abdominal tenderness. There is no guarding or rebound.    Musculoskeletal:        General: Normal range of motion.     Cervical back: Normal range of motion. No rigidity.     Right lower leg: No edema.     Left lower leg: No edema.  Skin:    General: Skin is warm and dry.     Capillary Refill: Capillary refill takes less than 2 seconds.  Neurological:     Mental Status: He is alert.     GCS: GCS eye subscore is 4. GCS verbal subscore is 5. GCS motor subscore is 6.     Cranial Nerves: Cranial nerves 2-12 are intact.     Sensory: Sensation is intact.     Motor: Motor function is intact.     Coordination: Coordination is intact.     Comments: Pleasantly demented  Gait not tested 2/2 pt safety   Psychiatric:        Mood and Affect: Mood normal.        Behavior: Behavior normal. Behavior is cooperative.     ED Results and Treatments Labs (  all labs ordered are listed, but only abnormal results are displayed) Labs Reviewed  CBC WITH DIFFERENTIAL/PLATELET - Abnormal; Notable for the following components:      Result Value   WBC 15.9 (*)    Neutro Abs 14.2 (*)    All other components within normal limits  COMPREHENSIVE METABOLIC PANEL - Abnormal; Notable for the following  components:   CO2 21 (*)    Glucose, Bld 144 (*)    Creatinine, Ser 1.51 (*)    GFR, Estimated 46 (*)    All other components within normal limits  ACETAMINOPHEN LEVEL - Abnormal; Notable for the following components:   Acetaminophen (Tylenol), Serum <10 (*)    All other components within normal limits  URINE CULTURE  RESP PANEL BY RT-PCR (RSV, FLU A&B, COVID)  RVPGX2  LIPASE, BLOOD  LACTIC ACID, PLASMA  AMMONIA  ETHANOL  URINALYSIS, ROUTINE W REFLEX MICROSCOPIC  LACTIC ACID, PLASMA  RAPID URINE DRUG SCREEN, HOSP PERFORMED  BLOOD GAS, VENOUS  SALICYLATE LEVEL  TROPONIN I (HIGH SENSITIVITY)  TROPONIN I (HIGH SENSITIVITY)                                                                                                                          Radiology DG Chest 2 View  Result Date: 03/10/2022 CLINICAL DATA:  Shortness of breath, altered mental status EXAM: CHEST - 2 VIEW COMPARISON:  08/31/2021 and 07/05/2021 FINDINGS: Mild enlargement of the cardiopericardial silhouette noted along with bilateral pleural calcifications and underlying centrilobular emphysema. Low lung volumes are present, causing crowding of the pulmonary vasculature. Indistinct left hemidiaphragm and left heart border raising suspicion for airspace opacity in the left lower lobe and potentially the lingula. The patient is rotated to the left on today's radiograph, reducing diagnostic sensitivity and specificity. Atherosclerotic calcification of the aortic arch. Right proximal humeral fixator noted. IMPRESSION: 1. Indistinct left hemidiaphragm and left heart border raising suspicion for airspace opacity in the left lower lobe and potentially the lingula. Pneumonia is not excluded. 2. Mild enlargement of the cardiopericardial silhouette, without edema. 3. Aortic Atherosclerosis (ICD10-I70.0) and Emphysema (ICD10-J43.9). 4. 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 Electronically Signed   By:  Van Clines M.D.   On: 03/10/2022 15:07    Pertinent labs & imaging results that were available during my care of the patient were reviewed by me and considered in my medical decision making (see MDM for details).  Medications Ordered in ED Medications - No data to display  Procedures .Critical Care  Performed by: Jeanell Sparrow, DO Authorized by: Jeanell Sparrow, DO   Critical care provider statement:    Critical care time (minutes):  30   Critical care time was exclusive of:  Separately billable procedures and treating other patients   Critical care was necessary to treat or prevent imminent or life-threatening deterioration of the following conditions:  Respiratory failure   Critical care was time spent personally by me on the following activities:  Development of treatment plan with patient or surrogate, discussions with consultants, evaluation of patient's response to treatment, examination of patient, ordering and review of laboratory studies, ordering and review of radiographic studies, ordering and performing treatments and interventions, pulse oximetry, re-evaluation of patient's condition, review of old charts and obtaining history from patient or surrogate   (including critical care time)  Medical Decision Making / ED Course   MDM:  Matthew Knight is a 82 y.o. male  with past medical history as below, significant for dementia, nursing home resident, DM, cva who presents to the ED with complaint of ams, unable to urinate. . The complaint involves an extensive differential diagnosis and also carries with it a high risk of complications and morbidity.  Serious etiology was considered. Ddx includes but is not limited to: Differential diagnosis includes but is not exclusive to acute appendicitis, renal colic, testicular torsion, urinary tract  infection, prostatitis,  diverticulitis, small bowel obstruction, colitis, abdominal aortic aneurysm, gastroenteritis, constipation etc.   On initial assessment the patient is: resting comfortably, NAD, on 2L [place], hr elevated Vital signs and nursing notes were reviewed    Pt with abd pain, distension, urge to urinate. Will get bladder scan and place foley if needed. Labs pending at time of shift change. CXR has resulted and has ?pna. Pt is poor historian but denies any cough/fever/dib. Anticipate likely admission given need for supplemental oxygen [facility]) that he does not typically use at home. CT a/p and CTH pending at shift change along with majority of his lab work. Signed out to incoming EDP at this time, pt HDS.   Additional history obtained: -Additional history obtained from ems -External records from outside source obtained and reviewed including: Chart review including previous notes, labs, imaging, consultation notes including prior ed visits, prior labs/imaging/home meds/ primary care documentation    Lab Tests: -I ordered, reviewed, and interpreted labs.   The pertinent results include:   Labs Reviewed  CBC WITH DIFFERENTIAL/PLATELET - Abnormal; Notable for the following components:      Result Value   WBC 15.9 (*)    Neutro Abs 14.2 (*)    All other components within normal limits  COMPREHENSIVE METABOLIC PANEL - Abnormal; Notable for the following components:   CO2 21 (*)    Glucose, Bld 144 (*)    Creatinine, Ser 1.51 (*)    GFR, Estimated 46 (*)    All other components within normal limits  ACETAMINOPHEN LEVEL - Abnormal; Notable for the following components:   Acetaminophen (Tylenol), Serum <10 (*)    All other components within normal limits  URINE CULTURE  RESP PANEL BY RT-PCR (RSV, FLU A&B, COVID)  RVPGX2  LIPASE, BLOOD  LACTIC ACID, PLASMA  AMMONIA  ETHANOL  URINALYSIS, ROUTINE W REFLEX MICROSCOPIC  LACTIC ACID, PLASMA  RAPID URINE DRUG SCREEN, HOSP PERFORMED   BLOOD GAS, VENOUS  SALICYLATE LEVEL  TROPONIN I (HIGH SENSITIVITY)  TROPONIN I (HIGH SENSITIVITY)    Notable for leukocytosis  EKG   EKG  Interpretation  Date/Time:    Ventricular Rate:    PR Interval:    QRS Duration:   QT Interval:    QTC Calculation:   R Axis:     Text Interpretation:           Imaging Studies ordered: I ordered imaging studies including CXR, CTH, CTAP I independently visualized the following imaging with scope of interpretation limited to determining acute life threatening conditions related to emergency care: CXR< CTH  CTAP, which revealed cxr with questionable PNA, CTH CTAP pending I independently visualized and interpreted imaging. I agree with the radiologist interpretation   Medicines ordered and prescription drug management: No orders of the defined types were placed in this encounter.   -I have reviewed the patients home medicines and have made adjustments as needed   Consultations Obtained: I requested consultation with the na,  and discussed lab and imaging findings as well as pertinent plan - they recommend: na   Cardiac Monitoring: The patient was maintained on a cardiac monitor.  I personally viewed and interpreted the cardiac monitored which showed an underlying rhythm of: NSR  Social Determinants of Health:  Diagnosis or treatment significantly limited by social determinants of health: dementia, nursing home resident    Reevaluation: After the interventions noted above, I reevaluated the patient and found that they have stayed the same  Co morbidities that complicate the patient evaluation  Past Medical History:  Diagnosis Date   Dementia ([place])    Diabetes mellitus without complication ([place])    Hearing loss    Hepatitis    Small vessel disease ([place])    Stroke ([place])       Dispostion: Disposition decision including need for hospitalization was considered, and patient dispo pending at shift change.     Final  Clinical Impression(s) / ED Diagnoses Final diagnoses:  Altered mental status, unspecified altered mental status type     This chart was dictated using voice recognition software.  Despite best efforts to proofread,  errors can occur which can change the documentation meaning.    Jeanell Sparrow, DO 03/10/22 1624

## 2022-03-10 NOTE — Assessment & Plan Note (Signed)
Mild with creatinine 1.51 on admission (previously 1.11 12/28/2021).  Likely secondary to urinary retention.  Foley placed in ED. -Repeat labs in a.m.

## 2022-03-11 DIAGNOSIS — J189 Pneumonia, unspecified organism: Secondary | ICD-10-CM | POA: Diagnosis not present

## 2022-03-11 LAB — URINE CULTURE: Culture: NO GROWTH

## 2022-03-11 LAB — BASIC METABOLIC PANEL
Anion gap: 8 (ref 5–15)
BUN: 21 mg/dL (ref 8–23)
CO2: 23 mmol/L (ref 22–32)
Calcium: 8.9 mg/dL (ref 8.9–10.3)
Chloride: 111 mmol/L (ref 98–111)
Creatinine, Ser: 1.2 mg/dL (ref 0.61–1.24)
GFR, Estimated: >60 mL/min (ref >60)
Glucose, Bld: 103 mg/dL — ABNORMAL HIGH (ref 70–99)
Potassium: 3.6 mmol/L (ref 3.5–5.1)
Sodium: 142 mmol/L (ref 135–145)

## 2022-03-11 LAB — CBC
HCT: 42.1 % (ref 39.0–52.0)
Hemoglobin: 13.9 g/dL (ref 13.0–17.0)
MCH: 29.6 pg (ref 26.0–34.0)
MCHC: 33 g/dL (ref 30.0–36.0)
MCV: 89.8 fL (ref 80.0–100.0)
Platelets: 278 10*3/uL (ref 150–400)
RBC: 4.69 MIL/uL (ref 4.22–5.81)
RDW: 13.2 % (ref 11.5–15.5)
WBC: 13.2 10*3/uL — ABNORMAL HIGH (ref 4.0–10.5)
nRBC: 0 % (ref 0.0–0.2)

## 2022-03-11 LAB — STREP PNEUMONIAE URINARY ANTIGEN: Strep Pneumo Urinary Antigen: NEGATIVE

## 2022-03-11 MED ORDER — LISINOPRIL 10 MG PO TABS
10.0000 mg | ORAL_TABLET | Freq: Every day | ORAL | Status: DC
  Administered 2022-03-12: 10 mg via ORAL
  Filled 2022-03-11: qty 1

## 2022-03-11 MED ORDER — CHLORHEXIDINE GLUCONATE CLOTH 2 % EX PADS
6.0000 | MEDICATED_PAD | Freq: Every day | CUTANEOUS | Status: DC
  Administered 2022-03-12: 6 via TOPICAL

## 2022-03-11 NOTE — ED Notes (Signed)
ED TO INPATIENT HANDOFF REPORT  ED Nurse Name and Phone #: Danise Edge Name/Age/Gender Matthew Knight 82 y.o. male Room/Bed: WA13/WA13  Code Status   Code Status: DNR  Home/SNF/Other Nursing Home Patient oriented to: self Is this baseline? Yes   Triage Complete: Triage complete  Chief Complaint Community acquired pneumonia of left lower lobe of lung [J18.9]  Triage Note Pt arrived via EMS from [facility], independent living, hx of dementia. Increasing confusion. Staff went to check on pt today, concerned for urinary retention and abd distention.   87 % RA - placed on 2L [place], does not wear o2 baseline.    Allergies Allergies  Allergen Reactions   Aspirin Other (See Comments)    "BLEEDS OUT INTERNALLY"   Nsaids Other (See Comments)    NO ANTI-COAGULANTS, EITHER!!!! "BLEEDS OUT INTERNALLY" (ONLY Tylenol is tolerated)   Darunavir Other (See Comments)    Eruption of the skin   Rivastigmine Other (See Comments)    Brand name only for the patch- "otherwise, he will have a violent reaction"   Sulfa Antibiotics Other (See Comments)    States this "made him crazy"    Level of Care/Admitting Diagnosis ED Disposition     ED Disposition  Admit   Condition  --   [place]: [place] [[id number]]  Level of Care: Progressive [102]  Admit to Progressive based on following criteria: RESPIRATORY PROBLEMS hypoxemic/hypercapnic respiratory failure that is responsive to NIPPV (BiPAP) or High Flow Nasal Cannula (6-80 lpm). Frequent assessment/intervention, no > Q2 hrs < Q4 hrs, to maintain oxygenation and pulmonary hygiene.  May admit patient to [facility] or [facility] if equivalent level of care is available:: No  Covid Evaluation: Confirmed COVID Negative  Diagnosis: Community acquired pneumonia of left lower lobe of lung [[id number]]  Admitting Physician: Lenore Cordia [[id number]]  Attending Physician: Lenore Cordia [[id number]]   Certification:: I certify this patient will need inpatient services for at least 2 midnights  Estimated Length of Stay: 2          B Medical/Surgery History Past Medical History:  Diagnosis Date   Dementia ([place])    Diabetes mellitus without complication ([place])    Hearing loss    Hepatitis    Small vessel disease ([place])    Stroke [facility])    Past Surgical History:  Procedure Laterality Date   APPENDECTOMY     BIOPSY  07/08/2021   Procedure: BIOPSY;  Surgeon: Wilford Corner, MD;  Location: [place];  Service: Gastroenterology;;   COLONOSCOPY WITH PROPOFOL N/A 03/24/2021   Procedure: COLONOSCOPY WITH PROPOFOL;  Surgeon: Mauri Pole, MD;  [facility];  Service: Endoscopy;  Laterality: N/A;   COLONOSCOPY WITH PROPOFOL N/A 07/08/2021   Procedure: COLONOSCOPY WITH PROPOFOL;  Surgeon: Wilford Corner, MD;  Location: [place];  Service: Gastroenterology;  Laterality: N/A;   ESOPHAGOGASTRODUODENOSCOPY (EGD) WITH PROPOFOL N/A 03/24/2021   Procedure: ESOPHAGOGASTRODUODENOSCOPY (EGD) WITH PROPOFOL;  Surgeon: Mauri Pole, MD;  Location: [place] [facility];  Service: Endoscopy;  Laterality: N/A;   GIVENS CAPSULE STUDY N/A 07/05/2021   Procedure: GIVENS CAPSULE STUDY;  Surgeon: Wilford Corner, MD;  Location: [place];  Service: Gastroenterology;  Laterality: N/A;   HEMOSTASIS CLIP PLACEMENT  03/24/2021   Procedure: HEMOSTASIS CLIP PLACEMENT;  Surgeon: Mauri Pole, MD;  Location: [place];  Service: Endoscopy;;   HOT HEMOSTASIS N/A 03/24/2021   Procedure: HOT HEMOSTASIS (ARGON PLASMA COAGULATION/BICAP);  Surgeon: Mauri Pole, MD;  [facility];  Service: Endoscopy;  Laterality: N/A;   HOT HEMOSTASIS N/A 07/08/2021   Procedure: HOT HEMOSTASIS (ARGON PLASMA COAGULATION/BICAP);  Surgeon: Wilford Corner, MD;  Location: [place];  Service: Gastroenterology;  Laterality: N/A;   POLYPECTOMY  03/24/2021   Procedure: POLYPECTOMY;  Surgeon:  Mauri Pole, MD;  Location: [place] [facility];  Service: Endoscopy;;   POLYPECTOMY  07/08/2021   Procedure: POLYPECTOMY;  Surgeon: Wilford Corner, MD;  Location: [place];  Service: Gastroenterology;;     A IV Location/Drains/Wounds Patient Lines/Drains/Airways Status     Active Line/Drains/Airways     Name Placement date Placement time Site Days   Peripheral IV 03/10/22 20 G Left Hand 03/10/22  --  Hand  1   Urethral Catheter 14 Fr. 03/10/22  --  --  1            Intake/Output Last 24 hours  Intake/Output Summary (Last 24 hours) at 03/11/2022 1559 Last data filed at 03/11/2022 0704 Gross per 24 hour  Intake --  Output 1400 ml  Net -1400 ml    Labs/Imaging Results for orders placed or performed during the hospital encounter of 03/10/22 (from the past 48 hour(s))  Acetaminophen level     Status: Abnormal   Collection Time: 03/10/22  2:30 PM  Result Value Ref Range   Acetaminophen (Tylenol), Serum <10 (L) 10 - 30 ug/mL    Comment: (NOTE) Therapeutic concentrations vary significantly. A range of 10-30 ug/mL  may be an effective concentration for many patients. However, some  are best treated at concentrations outside of this range. Acetaminophen concentrations >150 ug/mL at 4 hours after ingestion  and >50 ug/mL at 12 hours after ingestion are often associated with  toxic reactions.  Performed at [facility], [place] [address], [place], [place] [address]   Ethanol     Status: None   Collection Time: 03/10/22  2:30 PM  Result Value Ref Range   Alcohol, Ethyl (B) <10 <10 mg/dL    Comment: (NOTE) Lowest detectable limit for serum alcohol is 10 mg/dL.  For medical purposes only. Performed at [facility], [place] [address] [place], [place] [address]   CBC with Differential     Status: Abnormal   Collection Time: 03/10/22  3:25 PM  Result Value Ref Range   WBC 15.9 (H) 4.0 - 10.5 K/uL   RBC 4.86 4.22 - 5.81 MIL/uL    Hemoglobin 14.2 13.0 - 17.0 g/dL   HCT 43.1 39.0 - 52.0 %   MCV 88.7 80.0 - 100.0 fL   MCH 29.2 26.0 - 34.0 pg   MCHC 32.9 30.0 - 36.0 g/dL   RDW 13.0 11.5 - 15.5 %   Platelets 302 150 - 400 K/uL   nRBC 0.0 0.0 - 0.2 %   Neutrophils Relative % 89 %   Neutro Abs 14.2 (H) 1.7 - 7.7 K/uL   Lymphocytes Relative 6 %   Lymphs Abs 0.9 0.7 - 4.0 K/uL   Monocytes Relative 4 %   Monocytes Absolute 0.7 0.1 - 1.0 K/uL   Eosinophils Relative 0 %   Eosinophils Absolute 0.0 0.0 - 0.5 K/uL   Basophils Relative 1 %   Basophils Absolute 0.1 0.0 - 0.1 K/uL   Immature Granulocytes 0 %   Abs Immature Granulocytes 0.06 0.00 - 0.07 K/uL    Comment: Performed at [facility], [place] [address] [place], [place] [address]  Comprehensive metabolic panel     Status: Abnormal  Collection Time: 03/10/22  3:25 PM  Result Value Ref Range   Sodium 140 135 - 145 mmol/L   Potassium 4.1 3.5 - 5.1 mmol/L   Chloride 108 98 - 111 mmol/L   CO2 21 (L) 22 - 32 mmol/L   Glucose, Bld 144 (H) 70 - 99 mg/dL    Comment: Glucose reference range applies only to samples taken after fasting for at least 8 hours.   BUN 23 8 - 23 mg/dL   Creatinine, Ser 1.51 (H) 0.61 - 1.24 mg/dL   Calcium 9.1 8.9 - 10.3 mg/dL   Total Protein 7.4 6.5 - 8.1 g/dL   Albumin 4.3 3.5 - 5.0 g/dL   AST 28 15 - 41 U/L   ALT 39 0 - 44 U/L   Alkaline Phosphatase 64 38 - 126 U/L   Total Bilirubin 1.1 0.3 - 1.2 mg/dL   GFR, Estimated 46 (L) >60 mL/min    Comment: (NOTE) Calculated using the CKD-EPI Creatinine Equation (2021)    Anion gap 11 5 - 15    Comment: Performed at [facility], [place] [address] [place], [place] [address]  Lipase, blood     Status: None   Collection Time: 03/10/22  3:25 PM  Result Value Ref Range   Lipase 31 11 - 51 U/L    Comment: Performed at [facility], [place] [address], [place], [place] [address]  Lactic acid, plasma     Status: None   Collection Time: 03/10/22   3:25 PM  Result Value Ref Range   Lactic Acid, Venous 1.3 0.5 - 1.9 mmol/L    Comment: Performed at [facility], [place] [address], [place], [place] [address]  Ammonia     Status: None   Collection Time: 03/10/22  3:25 PM  Result Value Ref Range   Ammonia 22 9 - 35 umol/L    Comment: Performed at [facility], [place] [address], [place], [place] [address]  Troponin I (High Sensitivity)     Status: Abnormal   Collection Time: 03/10/22  3:25 PM  Result Value Ref Range   Troponin I (High Sensitivity) 18 (H) <18 ng/L    Comment: (NOTE) Elevated high sensitivity troponin I (hsTnI) values and significant  changes across serial measurements may suggest ACS but many other  chronic and acute conditions are known to elevate hsTnI results.  Refer to the "Links" section for chest pain algorithms and additional  guidance. Performed at [facility], [place] [address], [place], [place] [address]   Salicylate level     Status: Abnormal   Collection Time: 03/10/22  3:25 PM  Result Value Ref Range   Salicylate Lvl <3.1 (L) 7.0 - 30.0 mg/dL    Comment: Performed at [facility], [place] [address], [place], [place] [address]  Urinalysis, Routine w reflex microscopic     Status: Abnormal   Collection Time: 03/10/22  3:57 PM  Result Value Ref Range   Color, Urine YELLOW YELLOW   APPearance CLEAR CLEAR   Specific Gravity, Urine 1.020 1.005 - 1.030   pH 5.0 5.0 - 8.0   Glucose, UA NEGATIVE NEGATIVE mg/dL   Hgb urine dipstick SMALL (A) NEGATIVE   Bilirubin Urine NEGATIVE NEGATIVE   Ketones, ur NEGATIVE NEGATIVE mg/dL   Protein, ur NEGATIVE NEGATIVE mg/dL   Nitrite NEGATIVE NEGATIVE   Leukocytes,Ua NEGATIVE NEGATIVE   RBC / HPF 0-5 0 - 5 RBC/hpf   WBC, UA 0-5 0 -  5 WBC/hpf   Bacteria, UA NONE SEEN NONE SEEN   Squamous Epithelial / HPF 0-5 0 - 5 /HPF   Mucus PRESENT    Hyaline Casts, UA PRESENT     Comment: Performed at [facility], [place] [address], [place], [place] [address]  Resp panel by RT-PCR (RSV, Flu A&B, Covid)     Status: None   Collection Time: 03/10/22  3:57 PM   Specimen: Nasal Swab  Result Value Ref Range   SARS Coronavirus 2 by RT PCR NEGATIVE NEGATIVE    Comment: (NOTE) SARS-CoV-2 target nucleic acids are NOT DETECTED.  The SARS-CoV-2 RNA is generally detectable in upper respiratory specimens during the acute phase of infection. The lowest concentration of SARS-CoV-2 viral copies this assay can detect is 138 copies/mL. A negative result does not preclude SARS-Cov-2 infection and should not be used as the sole basis for treatment or other patient management decisions. A negative result may occur with  improper specimen collection/handling, submission of specimen other than nasopharyngeal swab, presence of viral mutation(s) within the areas targeted by this assay, and inadequate number of viral copies(<138 copies/mL). A negative result must be combined with clinical observations, patient history, and epidemiological information. The expected result is Negative.  Fact Sheet for Patients:  EntrepreneurPulse.com.au  Fact Sheet for Healthcare Providers:  IncredibleEmployment.be  This test is no t yet approved or cleared by the [place] FDA and  has been authorized for detection and/or diagnosis of SARS-CoV-2 by FDA under an Emergency Use Authorization (EUA). This EUA will remain  in effect (meaning this test can be used) for the duration of the COVID-19 declaration under Section 564(b)(1) of the Act, 21 U.S.C.section 360bbb-3(b)(1), unless the authorization is terminated  or revoked sooner.       Influenza A by PCR NEGATIVE NEGATIVE   Influenza B by PCR NEGATIVE NEGATIVE    Comment: (NOTE) The Xpert Xpress SARS-CoV-2/FLU/RSV plus assay is intended as an aid in the diagnosis of influenza from Nasopharyngeal swab specimens and should  not be used as a sole basis for treatment. Nasal washings and aspirates are unacceptable for Xpert Xpress SARS-CoV-2/FLU/RSV testing.  Fact Sheet for Patients: EntrepreneurPulse.com.au  Fact Sheet for Healthcare Providers: IncredibleEmployment.be  This test is not yet approved or cleared by the [place] FDA and has been authorized for detection and/or diagnosis of SARS-CoV-2 by FDA under an Emergency Use Authorization (EUA). This EUA will remain in effect (meaning this test can be used) for the duration of the COVID-19 declaration under Section 564(b)(1) of the Act, 21 U.S.C. section 360bbb-3(b)(1), unless the authorization is terminated or revoked.     Resp Syncytial Virus by PCR NEGATIVE NEGATIVE    Comment: (NOTE) Fact Sheet for Patients: EntrepreneurPulse.com.au  Fact Sheet for Healthcare Providers: IncredibleEmployment.be  This test is not yet approved or cleared by the [place] FDA and has been authorized for detection and/or diagnosis of SARS-CoV-2 by FDA under an Emergency Use Authorization (EUA). This EUA will remain in effect (meaning this test can be used) for the duration of the COVID-19 declaration under Section 564(b)(1) of the Act, 21 U.S.C. section 360bbb-3(b)(1), unless the authorization is terminated or revoked.  Performed at [facility], [place] [address], [place], [place] [address]   Rapid urine drug screen (hospital performed)     Status: None   Collection Time: 03/10/22  3:57 PM  Result Value Ref Range   Opiates NONE DETECTED NONE DETECTED   Cocaine NONE DETECTED  NONE DETECTED   Benzodiazepines NONE DETECTED NONE DETECTED   Amphetamines NONE DETECTED NONE DETECTED   Tetrahydrocannabinol NONE DETECTED NONE DETECTED   Barbiturates NONE DETECTED NONE DETECTED    Comment: (NOTE) DRUG SCREEN FOR MEDICAL PURPOSES ONLY.  IF CONFIRMATION IS NEEDED FOR ANY  PURPOSE, NOTIFY LAB WITHIN 5 DAYS.  LOWEST DETECTABLE LIMITS FOR URINE DRUG SCREEN Drug Class                     Cutoff (ng/mL) Amphetamine and metabolites    1000 Barbiturate and metabolites    200 Benzodiazepine                 200 Opiates and metabolites        300 Cocaine and metabolites        300 THC                            50 Performed at [facility], [place] [address] [place], [place] [address]   Strep pneumoniae urinary antigen     Status: None   Collection Time: 03/10/22  3:57 PM  Result Value Ref Range   Strep Pneumo Urinary Antigen NEGATIVE NEGATIVE    Comment:        Infection due to S. pneumoniae cannot be absolutely ruled out since the antigen present may be below the detection limit of the test. Performed at [facility], [address], [place], [place] [address]   Lactic acid, plasma     Status: None   Collection Time: 03/10/22  6:45 PM  Result Value Ref Range   Lactic Acid, Venous 1.1 0.5 - 1.9 mmol/L    Comment: Performed at [facility], [place] [address], [place], [place] [address]  Troponin I (High Sensitivity)     Status: Abnormal   Collection Time: 03/10/22  6:45 PM  Result Value Ref Range   Troponin I (High Sensitivity) 20 (H) <18 ng/L    Comment: (NOTE) Elevated high sensitivity troponin I (hsTnI) values and significant  changes across serial measurements may suggest ACS but many other  chronic and acute conditions are known to elevate hsTnI results.  Refer to the "Links" section for chest pain algorithms and additional  guidance. Performed at [facility], [place] [address], [place], [place] [address]   CBC     Status: Abnormal   Collection Time: 03/11/22  3:40 AM  Result Value Ref Range   WBC 13.2 (H) 4.0 - 10.5 K/uL   RBC 4.69 4.22 - 5.81 MIL/uL   Hemoglobin 13.9 13.0 - 17.0 g/dL   HCT 42.1 39.0 - 52.0 %   MCV 89.8 80.0 - 100.0 fL   MCH 29.6 26.0 - 34.0 pg   MCHC 33.0 30.0 -  36.0 g/dL   RDW 13.2 11.5 - 15.5 %   Platelets 278 150 - 400 K/uL   nRBC 0.0 0.0 - 0.2 %    Comment: Performed at [facility], [place] [address] [place],  [address]  Basic metabolic panel     Status: Abnormal   Collection Time: 03/11/22  3:40 AM  Result Value Ref Range   Sodium 142 135 - 145 mmol/L   Potassium 3.6 3.5 - 5.1 mmol/L   Chloride 111 98 - 111 mmol/L   CO2 23 22 - 32 mmol/L   Glucose, Bld 103 (H) 70 - 99 mg/dL  Comment: Glucose reference range applies only to samples taken after fasting for at least 8 hours.   BUN 21 8 - 23 mg/dL   Creatinine, Ser 1.20 0.61 - 1.24 mg/dL   Calcium 8.9 8.9 - 10.3 mg/dL   GFR, Estimated >60 >60 mL/min    Comment: (NOTE) Calculated using the CKD-EPI Creatinine Equation (2021)    Anion gap 8 5 - 15    Comment: Performed at [facility], [place] [address], [place], [place] [address]   US RENAL  Result Date: 03/10/2022 CLINICAL DATA:  Acute renal injury EXAM: RENAL / URINARY TRACT ULTRASOUND COMPLETE COMPARISON:  CT from earlier in the same day. FINDINGS: Right Kidney: Renal measurements: 10.3 x 5.7 x 5.6 cm. = volume: 175 mL. Mild fullness of the collecting system is noted. Left Kidney: Renal measurements: 12.1 x 5.6 x 5.5 cm. = volume: 194 mL. Echogenicity within normal limits. No mass or hydronephrosis visualized. Bladder: Decompressed by Foley catheter. Other: None. IMPRESSION: Interval decompression of the bladder by Foley catheter. The previously seen fullness of the collecting systems has improved with only minimal fullness remaining on the right. These changes were likely accentuated by the distended bladder. No other focal abnormality is noted. Electronically Signed   By: Inez Catalina M.D.   On: 03/10/2022 20:48   CT ABDOMEN PELVIS W CONTRAST  Result Date: 03/10/2022 CLINICAL DATA:  Abdominal pain and potential urinary retention. EXAM: CT ABDOMEN AND PELVIS WITH CONTRAST TECHNIQUE: Multidetector CT  imaging of the abdomen and pelvis was performed using the standard protocol following bolus administration of intravenous contrast. RADIATION DOSE REDUCTION: This exam was performed according to the departmental dose-optimization program which includes automated exposure control, adjustment of the mA and/or kV according to patient size and/or use of iterative reconstruction technique. CONTRAST:  171m OMNIPAQUE IOHEXOL 300 MG/ML  SOLN COMPARISON:  CT of the chest, abdomen and pelvis of August 31, 2021 FINDINGS: Lower chest: Calcified pleural plaques as on previous imaging. Heart size is mildly enlarged. Lower chest evaluation limited by arm position. No acute chest wall process. No consolidation or effusion. Hepatobiliary: Marked hepatic steatosis. No focal, suspicious hepatic lesion. No pericholecystic stranding or biliary duct dilation. Pancreas: Normal, without mass, inflammation or ductal dilatation. Spleen: Normal. Adrenals/Urinary Tract: 1.5 cm RIGHT adrenal adenoma shown to be less than 10 Hounsfield units on previous imaging from August 31, 2021. No dedicated follow-up imaging is recommended for this finding. Symmetric renal enhancement. Mild fullness of the RIGHT renal collecting systems and ureter in the setting of moderate urinary bladder distension. Urinary bladder extends to the sacral promontory. Mild fullness of the LEFT ureter as well. 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 Mild perivesical stranding mainly in the space of Retzius. On image 79/3 there is 41 Hounsfield unit density of the distal RIGHT ureter which is focally dilated to approximately 9 mm duplication of the RIGHT ureter is also noted to the level of the distal RIGHT ureter likely. Stomach/Bowel: Small hiatal hernia. No perigastric stranding. No stranding adjacent to small bowel or sign of small bowel obstruction. No sign of acute colonic process. Appendix surgically absent by report. No  acute colonic process. Vascular/Lymphatic: Aortic atherosclerosis. No sign of aneurysm. Smooth contour of the IVC. There is no gastrohepatic or hepatoduodenal ligament lymphadenopathy. No retroperitoneal or mesenteric lymphadenopathy. No pelvic sidewall lymphadenopathy. Reproductive: Prostatomegaly with heterogeneity of the prostate, nonspecific finding on CT. Other: No ascites or pneumoperitoneum. Musculoskeletal: No acute musculoskeletal process.  Spinal degenerative changes. IMPRESSION: 1. Findings suspicious for bladder outlet obstruction with resultant bilateral ureteral dilation with mild RIGHT hydronephrosis and minimal collecting system dilation on the LEFT. Stranding adjacent to the urinary bladder may be due to bladder outlet obstruction or cystitis. There is also little if any excretion of contrast into the urinary tract implying physiologic significance of "urinary tract obstruction" despite only mild hydronephrosis currently. 2. Heterogeneity of the prostate while nonspecific shows an indistinct appearance. Correlate with any signs of prostatitis. Follow-up with urology and with PSA assessment may be helpful as well. 3. Question of focal dilation of the distal RIGHT ureter in the setting of duplicated RIGHT renal collecting systems. Appears to be single ureter beyond the sacral promontory. Cystoscopic evaluation and ureteral correlation with direct visualization of the distal RIGHT ureter may be helpful. Enhancement could also be due to relative obstruction and perhaps concomitant urinary tract infection. Correlate also with urinalysis. 4. Marked hepatic steatosis. 5. 1.5 cm RIGHT adrenal adenoma. Based on current clinical literature, is suggested if not already performed. Please refer to current clinical guidelines for detailed recommendations. NEJM 1749:449 154-51 6. Calcified pleural plaques as on previous imaging. 7. Aortic atherosclerosis. Aortic Atherosclerosis (ICD10-I70.0). Electronically Signed    By: Zetta Bills M.D.   On: 03/10/2022 16:52   CT Head Wo Contrast  Result Date: 03/10/2022 CLINICAL DATA:  History of dementia, increased confusion EXAM: CT HEAD WITHOUT CONTRAST TECHNIQUE: Contiguous axial images were obtained from the base of the skull through the vertex without intravenous contrast. RADIATION DOSE REDUCTION: This exam was performed according to the departmental dose-optimization program which includes automated exposure control, adjustment of the mA and/or kV according to patient size and/or use of iterative reconstruction technique. COMPARISON:  07/04/2021 FINDINGS: Brain: No evidence of acute infarction, hemorrhage, mass, mass effect, or midline shift. No hydrocephalus or extra-axial fluid collection. Cerebral atrophy with ex vacuo dilatation of the ventricles, unchanged from the prior exam. Periventricular white matter changes, likely the sequela of chronic small vessel ischemic disease. Calcifications in the bilateral thalami Vascular: No hyperdense vessel. Skull: Normal. Negative for fracture or focal lesion. Sinuses/Orbits: Clear paranasal sinuses. No acute finding in the orbits. Other: The mastoid air cells are well aerated. IMPRESSION: No acute intracranial process. Electronically Signed   By: Merilyn Baba M.D.   On: 03/10/2022 16:43   DG Chest 2 View  Result Date: 03/10/2022 CLINICAL DATA:  Shortness of breath, altered mental status EXAM: CHEST - 2 VIEW COMPARISON:  08/31/2021 and 07/05/2021 FINDINGS: Mild enlargement of the cardiopericardial silhouette noted along with bilateral pleural calcifications and underlying centrilobular emphysema. Low lung volumes are present, causing crowding of the pulmonary vasculature. Indistinct left hemidiaphragm and left heart border raising suspicion for airspace opacity in the left lower lobe and potentially the lingula. The patient is rotated to the left on today's radiograph, reducing diagnostic sensitivity and specificity.  Atherosclerotic calcification of the aortic arch. Right proximal humeral fixator noted. IMPRESSION: 1. Indistinct left hemidiaphragm and left heart border raising suspicion for airspace opacity in the left lower lobe and potentially the lingula. Pneumonia is not excluded. 2. Mild enlargement of the cardiopericardial silhouette, without edema. 3. Aortic Atherosclerosis (ICD10-I70.0) and Emphysema (ICD10-J43.9). 4. 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 Electronically Signed   By: Van Clines M.D.   On: 03/10/2022 15:07    Pending Labs Unresulted Labs (From admission, onward)     Start     Ordered   03/10/22 1628  Blood gas, venous  Once,   R        03/10/22 1628   03/10/22 1315  Urine Culture  (Urine Culture)  Once,   URGENT       Question:  Indication  Answer:  Altered mental status (if no other cause identified)   03/10/22 1316            Vitals/Pain Today's Vitals   03/11/22 1345 03/11/22 1400 03/11/22 1500 03/11/22 1556  BP: (!) 153/62 (!) 142/57 127/74   Pulse: 71 71 90 64  Resp: '18 18  16  '$ Temp:      TempSrc:      SpO2: 98% 93%  92%    Isolation Precautions No active isolations  Medications Medications  cefTRIAXone (ROCEPHIN) 1 g in sodium chloride 0.9 % 100 mL IVPB (has no administration in time range)  azithromycin (ZITHROMAX) 500 mg in sodium chloride 0.9 % 250 mL IVPB (has no administration in time range)  enoxaparin (LOVENOX) injection 40 mg (40 mg Subcutaneous Given 03/10/22 2110)  sodium chloride flush (NS) 0.9 % injection 3 mL (3 mLs Intravenous Given 03/11/22 1037)  acetaminophen (TYLENOL) tablet 650 mg (has no administration in time range)    Or  acetaminophen (TYLENOL) suppository 650 mg (has no administration in time range)  ondansetron (ZOFRAN) tablet 4 mg (has no administration in time range)    Or  ondansetron (ZOFRAN) injection 4 mg (has no administration in time range)  senna-docusate (Senokot-S) tablet 1  tablet (has no administration in time range)  escitalopram (LEXAPRO) tablet 20 mg (20 mg Oral Given 03/11/22 1033)  isosorbide mononitrate (IMDUR) 24 hr tablet 30 mg (30 mg Oral Given 03/11/22 1033)  pantoprazole (PROTONIX) EC tablet 40 mg (40 mg Oral Given 03/11/22 0734)  pravastatin (PRAVACHOL) tablet 20 mg (20 mg Oral Given 03/11/22 1056)  tamsulosin (FLOMAX) capsule 0.4 mg (0.4 mg Oral Given 03/11/22 1033)  lisinopril (ZESTRIL) tablet 10 mg (has no administration in time range)  iohexol (OMNIPAQUE) 300 MG/ML solution 100 mL (100 mLs Intravenous Contrast Given 03/10/22 1626)  cefTRIAXone (ROCEPHIN) 1 g in sodium chloride 0.9 % 100 mL IVPB (0 g Intravenous Stopped 03/10/22 1917)  azithromycin (ZITHROMAX) 500 mg in sodium chloride 0.9 % 250 mL IVPB (0 mg Intravenous Stopped 03/10/22 2105)    Mobility walks with device High fall risk   Focused Assessments     R Recommendations: See Admitting Provider Note  Report given to:   Additional Notes:

## 2022-03-11 NOTE — Progress Notes (Signed)
PROGRESS NOTE    Matthew Knight  CWC:[id number] DOB: 01-08-1941 DOA: 03/10/2022 PCP: [name], MD   Brief Narrative:    Matthew Knight is a 82 y.o. male with medical history significant for dementia, history of CVA, HTN, HLD who presented to the ED from [facility] independent living facility for evaluation of confusion and abdominal pain.  He was admitted for acute hypoxemic respiratory failure secondary to community-acquired pneumonia of the left lower lobe.  He is also noted to have some AKI likely in the setting of BPH with urinary obstruction.  Foley catheter has been placed with relief of obstruction as noted on ultrasound.  He continues to remain somnolent and minimally responsive.  Assessment & Plan:   Principal Problem:   Community acquired pneumonia of left lower lobe of lung Active Problems:   Acute respiratory failure with hypoxia (HCC)   BPH with urinary obstruction   AKI (acute kidney injury) ([place])   Benign essential HTN   Dementia without behavioral disturbance (HCC)   HLD (hyperlipidemia)  Assessment and Plan:  Community acquired pneumonia of left lower lobe of lung -Continue IV ceftriaxone and azithromycin -Follow strep pneumonia urinary antigen -Continue supplemental oxygen and wean as able   Acute respiratory failure with hypoxia (HCC) SpO2 85% on room air on arrival, requiring 4 L via [place] on admission. -Continue antibiotics and wean supplemental O2 as able   BPH with urinary obstruction CT suggestive of bladder outlet obstruction with mild right hydronephrosis.  Question of distal right ureteral dilation. -Foley placed in ED, monitor UOP -Continue Rapaflo -Renal ultrasound with signs of improvement after Foley catheter placement; will need to be discharged with Foley catheter and follow-up with urology.  Daughter working on a plan for having a sitter with him so that he does not remove this catheter upon discharge.   AKI (acute kidney  injury) (HCC)-resolving Likely secondary to obstruction with obstruction resolved after Foley catheter placement -Continue to follow a.m. labs   Benign essential HTN Continue Imdur Okay to resume lisinopril   HLD (hyperlipidemia) Continue statin.   Dementia without behavioral disturbance ([place]) Continue delirium precautions.   DVT prophylaxis:Lovenox Code Status: DNR Family Communication: Discussed with daughter on phone 1/4 Disposition Plan:  Status is: Inpatient Remains inpatient appropriate because: Need for IV medications.   Consultants:  None  Procedures:  None  Antimicrobials:  Anti-infectives (From admission, onward)    Start     Dose/Rate Route Frequency Ordered Stop   03/11/22 1800  cefTRIAXone (ROCEPHIN) 1 g in sodium chloride 0.9 % 100 mL IVPB        1 g 200 mL/hr over 30 Minutes Intravenous Every 24 hours 03/10/22 2007     03/11/22 1800  azithromycin (ZITHROMAX) 500 mg in sodium chloride 0.9 % 250 mL IVPB        500 mg 250 mL/hr over 60 Minutes Intravenous Every 24 hours 03/10/22 2007     03/10/22 1845  cefTRIAXone (ROCEPHIN) 1 g in sodium chloride 0.9 % 100 mL IVPB        1 g 200 mL/hr over 30 Minutes Intravenous  Once 03/10/22 1840 03/10/22 1917   03/10/22 1845  azithromycin (ZITHROMAX) 500 mg in sodium chloride 0.9 % 250 mL IVPB        500 mg 250 mL/hr over 60 Minutes Intravenous  Once 03/10/22 1840 03/10/22 2105      Subjective: Patient seen and evaluated today and is quite somnolent with no acute overnight events noted.  Objective: Vitals:   03/11/22 0200 03/11/22 0300 03/11/22 0328 03/11/22 0700  BP: (!) 164/61 (!) 167/75  (!) 157/61  Pulse: 75 70  69  Resp: '18 19 19 20  '$ Temp:   98.2 F (36.8 C)   TempSrc:   Oral   SpO2: 93% 96%  100%   No intake or output data in the 24 hours ending 03/11/22 0704 There were no vitals filed for this visit.  Examination:  General exam: Appears calm and comfortable, somnolent but arousable Respiratory  system: Clear to auscultation. Respiratory effort normal.  4 L nasal cannula Cardiovascular system: S1 & S2 heard, RRR.  Gastrointestinal system: Abdomen is soft Central nervous system: Somnolent Extremities: No edema Skin: No significant lesions noted    Data Reviewed: I have personally reviewed following labs and imaging studies  CBC: Recent Labs  Lab 03/10/22 1525 03/11/22 0340  WBC 15.9* 13.2*  NEUTROABS 14.2*  --   HGB 14.2 13.9  HCT 43.1 42.1  MCV 88.7 89.8  PLT 302 676   Basic Metabolic Panel: Recent Labs  Lab 03/10/22 1525 03/11/22 0340  NA 140 142  K 4.1 3.6  CL 108 111  CO2 21* 23  GLUCOSE 144* 103*  BUN 23 21  CREATININE 1.51* 1.20  CALCIUM 9.1 8.9   GFR: CrCl cannot be calculated (Unknown ideal weight.). Liver Function Tests: Recent Labs  Lab 03/10/22 1525  AST 28  ALT 39  ALKPHOS 64  BILITOT 1.1  PROT 7.4  ALBUMIN 4.3   Recent Labs  Lab 03/10/22 1525  LIPASE 31   Recent Labs  Lab 03/10/22 1525  AMMONIA 22   Coagulation Profile: No results for input(s): "INR", "PROTIME" in the last 168 hours. Cardiac Enzymes: No results for input(s): "CKTOTAL", "CKMB", "CKMBINDEX", "TROPONINI" in the last 168 hours. BNP (last 3 results) No results for input(s): "PROBNP" in the last 8760 hours. HbA1C: No results for input(s): "HGBA1C" in the last 72 hours. CBG: No results for input(s): "GLUCAP" in the last 168 hours. Lipid Profile: No results for input(s): "CHOL", "HDL", "LDLCALC", "TRIG", "CHOLHDL", "LDLDIRECT" in the last 72 hours. Thyroid Function Tests: No results for input(s): "TSH", "T4TOTAL", "FREET4", "T3FREE", "THYROIDAB" in the last 72 hours. Anemia Panel: No results for input(s): "VITAMINB12", "FOLATE", "FERRITIN", "TIBC", "IRON", "RETICCTPCT" in the last 72 hours. Sepsis Labs: Recent Labs  Lab 03/10/22 1525 03/10/22 1845  LATICACIDVEN 1.3 1.1    Recent Results (from the past 240 hour(s))  Resp panel by RT-PCR (RSV, Flu A&B,  Covid)     Status: None   Collection Time: 03/10/22  3:57 PM   Specimen: Nasal Swab  Result Value Ref Range Status   SARS Coronavirus 2 by RT PCR NEGATIVE NEGATIVE Final    Comment: (NOTE) SARS-CoV-2 target nucleic acids are NOT DETECTED.  The SARS-CoV-2 RNA is generally detectable in upper respiratory specimens during the acute phase of infection. The lowest concentration of SARS-CoV-2 viral copies this assay can detect is 138 copies/mL. A negative result does not preclude SARS-Cov-2 infection and should not be used as the sole basis for treatment or other patient management decisions. A negative result may occur with  improper specimen collection/handling, submission of specimen other than nasopharyngeal swab, presence of viral mutation(s) within the areas targeted by this assay, and inadequate number of viral copies(<138 copies/mL). A negative result must be combined with clinical observations, patient history, and epidemiological information. The expected result is Negative.  Fact Sheet for Patients:  EntrepreneurPulse.com.au  Fact Sheet for  Healthcare Providers:  IncredibleEmployment.be  This test is no t yet approved or cleared by the [place] and  has been authorized for detection and/or diagnosis of SARS-CoV-2 by FDA under an Emergency Use Authorization (EUA). This EUA will remain  in effect (meaning this test can be used) for the duration of the COVID-19 declaration under Section 564(b)(1) of the Act, 21 U.S.C.section 360bbb-3(b)(1), unless the authorization is terminated  or revoked sooner.       Influenza A by PCR NEGATIVE NEGATIVE Final   Influenza B by PCR NEGATIVE NEGATIVE Final    Comment: (NOTE) The Xpert Xpress SARS-CoV-2/FLU/RSV plus assay is intended as an aid in the diagnosis of influenza from Nasopharyngeal swab specimens and should not be used as a sole basis for treatment. Nasal washings and aspirates are  unacceptable for Xpert Xpress SARS-CoV-2/FLU/RSV testing.  Fact Sheet for Patients: EntrepreneurPulse.com.au  Fact Sheet for Healthcare Providers: IncredibleEmployment.be  This test is not yet approved or cleared by the [place] FDA and has been authorized for detection and/or diagnosis of SARS-CoV-2 by FDA under an Emergency Use Authorization (EUA). This EUA will remain in effect (meaning this test can be used) for the duration of the COVID-19 declaration under Section 564(b)(1) of the Act, 21 U.S.C. section 360bbb-3(b)(1), unless the authorization is terminated or revoked.     Resp Syncytial Virus by PCR NEGATIVE NEGATIVE Final    Comment: (NOTE) Fact Sheet for Patients: EntrepreneurPulse.com.au  Fact Sheet for Healthcare Providers: IncredibleEmployment.be  This test is not yet approved or cleared by the [place] FDA and has been authorized for detection and/or diagnosis of SARS-CoV-2 by FDA under an Emergency Use Authorization (EUA). This EUA will remain in effect (meaning this test can be used) for the duration of the COVID-19 declaration under Section 564(b)(1) of the Act, 21 U.S.C. section 360bbb-3(b)(1), unless the authorization is terminated or revoked.  Performed at [facility], [place] [address] [place], [place] [address]          Radiology Studies: US RENAL  Result Date: 03/10/2022 CLINICAL DATA:  Acute renal injury EXAM: RENAL / URINARY TRACT ULTRASOUND COMPLETE COMPARISON:  CT from earlier in the same day. FINDINGS: Right Kidney: Renal measurements: 10.3 x 5.7 x 5.6 cm. = volume: 175 mL. Mild fullness of the collecting system is noted. Left Kidney: Renal measurements: 12.1 x 5.6 x 5.5 cm. = volume: 194 mL. Echogenicity within normal limits. No mass or hydronephrosis visualized. Bladder: Decompressed by Foley catheter. Other: None. IMPRESSION: Interval  decompression of the bladder by Foley catheter. The previously seen fullness of the collecting systems has improved with only minimal fullness remaining on the right. These changes were likely accentuated by the distended bladder. No other focal abnormality is noted. Electronically Signed   By: Inez Catalina M.D.   On: 03/10/2022 20:48   CT ABDOMEN PELVIS W CONTRAST  Result Date: 03/10/2022 CLINICAL DATA:  Abdominal pain and potential urinary retention. EXAM: CT ABDOMEN AND PELVIS WITH CONTRAST TECHNIQUE: Multidetector CT imaging of the abdomen and pelvis was performed using the standard protocol following bolus administration of intravenous contrast. RADIATION DOSE REDUCTION: This exam was performed according to the departmental dose-optimization program which includes automated exposure control, adjustment of the mA and/or kV according to patient size and/or use of iterative reconstruction technique. CONTRAST:  111m OMNIPAQUE IOHEXOL 300 MG/ML  SOLN COMPARISON:  CT of the chest, abdomen and pelvis of August 31, 2021 FINDINGS: Lower chest: Calcified pleural plaques as on previous  imaging. Heart size is mildly enlarged. Lower chest evaluation limited by arm position. No acute chest wall process. No consolidation or effusion. Hepatobiliary: Marked hepatic steatosis. No focal, suspicious hepatic lesion. No pericholecystic stranding or biliary duct dilation. Pancreas: Normal, without mass, inflammation or ductal dilatation. Spleen: Normal. Adrenals/Urinary Tract: 1.5 cm RIGHT adrenal adenoma shown to be less than 10 Hounsfield units on previous imaging from August 31, 2021. No dedicated follow-up imaging is recommended for this finding. Symmetric renal enhancement. Mild fullness of the RIGHT renal collecting systems and ureter in the setting of moderate urinary bladder distension. Urinary bladder extends to the sacral promontory. Mild fullness of the LEFT ureter as well. 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 Mild perivesical stranding mainly in the space of Retzius. On image 79/3 there is 41 Hounsfield unit density of the distal RIGHT ureter which is focally dilated to approximately 9 mm duplication of the RIGHT ureter is also noted to the level of the distal RIGHT ureter likely. Stomach/Bowel: Small hiatal hernia. No perigastric stranding. No stranding adjacent to small bowel or sign of small bowel obstruction. No sign of acute colonic process. Appendix surgically absent by report. No acute colonic process. Vascular/Lymphatic: Aortic atherosclerosis. No sign of aneurysm. Smooth contour of the IVC. There is no gastrohepatic or hepatoduodenal ligament lymphadenopathy. No retroperitoneal or mesenteric lymphadenopathy. No pelvic sidewall lymphadenopathy. Reproductive: Prostatomegaly with heterogeneity of the prostate, nonspecific finding on CT. Other: No ascites or pneumoperitoneum. Musculoskeletal: No acute musculoskeletal process. Spinal degenerative changes. IMPRESSION: 1. Findings suspicious for bladder outlet obstruction with resultant bilateral ureteral dilation with mild RIGHT hydronephrosis and minimal collecting system dilation on the LEFT. Stranding adjacent to the urinary bladder may be due to bladder outlet obstruction or cystitis. There is also little if any excretion of contrast into the urinary tract implying physiologic significance of "urinary tract obstruction" despite only mild hydronephrosis currently. 2. Heterogeneity of the prostate while nonspecific shows an indistinct appearance. Correlate with any signs of prostatitis. Follow-up with urology and with PSA assessment may be helpful as well. 3. Question of focal dilation of the distal RIGHT ureter in the setting of duplicated RIGHT renal collecting systems. Appears to be single ureter beyond the sacral promontory. Cystoscopic evaluation and ureteral correlation with direct visualization of the distal RIGHT  ureter may be helpful. Enhancement could also be due to relative obstruction and perhaps concomitant urinary tract infection. Correlate also with urinalysis. 4. Marked hepatic steatosis. 5. 1.5 cm RIGHT adrenal adenoma. Based on current clinical literature, is suggested if not already performed. Please refer to current clinical guidelines for detailed recommendations. NEJM 6010:932 154-51 6. Calcified pleural plaques as on previous imaging. 7. Aortic atherosclerosis. Aortic Atherosclerosis (ICD10-I70.0). Electronically Signed   By: Zetta Bills M.D.   On: 03/10/2022 16:52   CT Head Wo Contrast  Result Date: 03/10/2022 CLINICAL DATA:  History of dementia, increased confusion EXAM: CT HEAD WITHOUT CONTRAST TECHNIQUE: Contiguous axial images were obtained from the base of the skull through the vertex without intravenous contrast. RADIATION DOSE REDUCTION: This exam was performed according to the departmental dose-optimization program which includes automated exposure control, adjustment of the mA and/or kV according to patient size and/or use of iterative reconstruction technique. COMPARISON:  07/04/2021 FINDINGS: Brain: No evidence of acute infarction, hemorrhage, mass, mass effect, or midline shift. No hydrocephalus or extra-axial fluid collection. Cerebral atrophy with ex vacuo dilatation of the ventricles, unchanged from the prior exam. Periventricular white matter changes, likely the  sequela of chronic small vessel ischemic disease. Calcifications in the bilateral thalami Vascular: No hyperdense vessel. Skull: Normal. Negative for fracture or focal lesion. Sinuses/Orbits: Clear paranasal sinuses. No acute finding in the orbits. Other: The mastoid air cells are well aerated. IMPRESSION: No acute intracranial process. Electronically Signed   By: Merilyn Baba M.D.   On: 03/10/2022 16:43   DG Chest 2 View  Result Date: 03/10/2022 CLINICAL DATA:  Shortness of breath, altered mental status EXAM: CHEST - 2 VIEW  COMPARISON:  08/31/2021 and 07/05/2021 FINDINGS: Mild enlargement of the cardiopericardial silhouette noted along with bilateral pleural calcifications and underlying centrilobular emphysema. Low lung volumes are present, causing crowding of the pulmonary vasculature. Indistinct left hemidiaphragm and left heart border raising suspicion for airspace opacity in the left lower lobe and potentially the lingula. The patient is rotated to the left on today's radiograph, reducing diagnostic sensitivity and specificity. Atherosclerotic calcification of the aortic arch. Right proximal humeral fixator noted. IMPRESSION: 1. Indistinct left hemidiaphragm and left heart border raising suspicion for airspace opacity in the left lower lobe and potentially the lingula. Pneumonia is not excluded. 2. Mild enlargement of the cardiopericardial silhouette, without edema. 3. Aortic Atherosclerosis (ICD10-I70.0) and Emphysema (ICD10-J43.9). 4. 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 Electronically Signed   By: Van Clines M.D.   On: 03/10/2022 15:07        Scheduled Meds:  enoxaparin (LOVENOX) injection  40 mg Subcutaneous Q24H   escitalopram  20 mg Oral Daily   isosorbide mononitrate  30 mg Oral Daily   pantoprazole  40 mg Oral QAC breakfast   pravastatin  20 mg Oral Daily   sodium chloride flush  3 mL Intravenous Q12H   tamsulosin  0.4 mg Oral QPC breakfast   Continuous Infusions:  azithromycin     cefTRIAXone (ROCEPHIN)  IV       LOS: 1 day    Time spent: 35 minutes    Viera Okonski Darleen Crocker, DO [facility]  If 7PM-7AM, please contact night-coverage [email or web address] 03/11/2022, 7:04 AM

## 2022-03-12 DIAGNOSIS — J189 Pneumonia, unspecified organism: Secondary | ICD-10-CM | POA: Diagnosis not present

## 2022-03-12 MED ORDER — AMOXICILLIN-POT CLAVULANATE 500-125 MG PO TABS: 1.0000 | ORAL_TABLET | Freq: Three times a day (TID) | ORAL | 0 refills | Status: AC

## 2022-03-12 NOTE — Progress Notes (Signed)
Physical Therapy Treatment Patient Details Name: Matthew Knight MRN: [id number] DOB: Jul 18, 1940 Today's Date: 03/12/2022   History of Present Illness Matthew Knight is a 82 y.o. male with medical history significant for dementia, history of CVA, HTN, HLD who presented to the ED from [facility] independent living facility for evaluation of confusion and abdominal pain.  He was admitted for acute hypoxemic respiratory failure secondary to community-acquired pneumonia of the left lower lobe.  He is also noted to have some AKI likely in the setting of BPH with urinary obstruction. Foley catheter placed.    PT Comments    Patient found  standing in room and foley catheter attached to recliner. Patient stated he needed to go to the Br to urinate. Reinforced to patient that he had a  catheter. Assisted patient back in bed.   Recommendations for follow up therapy are one component of a multi-disciplinary discharge planning process, led by the attending physician.  Recommendations may be updated based on patient status, additional functional criteria and insurance authorization.  Follow Up Recommendations  Home health PT     Assistance Recommended at Discharge Frequent or constant Supervision/Assistance  Patient can return home with the following A little help with walking and/or transfers;A little help with bathing/dressing/bathroom;Assistance with cooking/housework;Assist for transportation   Equipment Recommendations  None recommended by PT    Recommendations for Other Services       Precautions / Restrictions Precautions Precautions: Fall Precaution Comments: does not remember he has a catheter     Mobility  Bed Mobility Overal bed mobility: Needs Assistance Bed Mobility: Sit to Supine       Sit to supine: Supervision        Transfers Overall transfer level: Needs assistance Equipment used: None Transfers: Sit to/from Stand Sit to Stand: Min guard            General transfer comment: pt. standing at foot of bed,    Ambulation/Gait Ambulation/Gait assistance: Min guard Gait Distance (Feet): 20 Feet Assistive device: None Gait Pattern/deviations: Step-through pattern       General Gait Details: abulated to Br and back to bed.   Stairs             Wheelchair Mobility    Modified Rankin (Stroke Patients Only)       Balance Overall balance assessment: Mild deficits observed, not formally tested                                          Cognition Arousal/Alertness: Awake/alert Behavior During Therapy: WFL for tasks assessed/performed Overall Cognitive Status: History of cognitive impairments - at baseline                                 General Comments: patient found standing in room , foley catheter still attached to reclienr. patient states, i need to go to bathroom. explained to patient foley.        Exercises      General Comments        Pertinent Vitals/Pain Pain Assessment Pain Assessment: No/denies pain    Home Living                     Additional Comments: pt from [facility] independent living, has aides from [place] that come to assist with  ADLs.    Prior Function            PT Goals (current goals can now be found in the care plan section) Acute Rehab PT Goals Patient Stated Goal: agreed to ambulate. PT Goal Formulation: Patient unable to participate in goal setting Time For Goal Achievement: 03/26/22 Potential to Achieve Goals: Good Progress towards PT goals: Progressing toward goals    Frequency    Min 2X/week      PT Plan Current plan remains appropriate    Co-evaluation              AM-PAC PT "6 Clicks" Mobility   Outcome Measure  Help needed turning from your back to your side while in a flat bed without using bedrails?: None Help needed moving from lying on your back to sitting on the side of a flat bed without using  bedrails?: None Help needed moving to and from a bed to a chair (including a wheelchair)?: A Little Help needed standing up from a chair using your arms (e.g., wheelchair or bedside chair)?: A Little Help needed to walk in hospital room?: A Little Help needed climbing 3-5 steps with a railing? : A Lot 6 Click Score: 19    End of Session Equipment Utilized During Treatment: Gait belt Activity Tolerance: Patient tolerated treatment well Patient left: in bed;with call bell/phone within reach;with bed alarm set Nurse Communication: Mobility status PT Visit Diagnosis: Unsteadiness on feet (R26.81);Difficulty in walking, not elsewhere classified (R26.2)     Time: 1210-1220 PT Time Calculation (min) (ACUTE ONLY): 10 min  Charges:  $Gait Training: 8-22 mins                    [place] Office [phone] Weekend [phone]\ Claretha Cooper 03/12/2022, 1:10 PM

## 2022-03-12 NOTE — Evaluation (Signed)
Physical Therapy Evaluation Patient Details Name: Matthew Knight MRN: [id number] DOB: 1940-12-22 Today's Date: 03/12/2022  History of Present Illness  Matthew Knight is a 82 y.o. male with medical history significant for dementia, history of CVA, HTN, HLD who presented to the ED from [facility] independent living facility for evaluation of confusion and abdominal pain.  He was admitted for acute hypoxemic respiratory failure secondary to community-acquired pneumonia of the left lower lobe.  He is also noted to have some AKI likely in the setting of BPH with urinary obstruction. Foley catheter placed.  Clinical Impression  Pt admitted with above diagnosis.  Pt currently with functional limitations due to the deficits listed below (see PT Problem List). Pt will benefit from skilled PT to increase their independence and safety with mobility to allow discharge to the venue listed below.     The patient is pleasant and confused. Patient not indicating awareness of  foley. Multiple redirection to not move without catheter. Patient ambulated with min guard. Recommend HHPT. Patient has a Rw per TOC note.   Patient resides at ALF with caregivers.    Recommendations for follow up therapy are one component of a multi-disciplinary discharge planning process, led by the attending physician.  Recommendations may be updated based on patient status, additional functional criteria and insurance authorization.  Follow Up Recommendations Home health PT      Assistance Recommended at Discharge Frequent or constant Supervision/Assistance  Patient can return home with the following  A little help with walking and/or transfers;A little help with bathing/dressing/bathroom;Assistance with cooking/housework;Assist for transportation    Equipment Recommendations None recommended by PT  Recommendations for Other Services       Functional Status Assessment Patient has had a recent decline in their  functional status and demonstrates the ability to make significant improvements in function in a reasonable and predictable amount of time.     Precautions / Restrictions Precautions Precautions: Fall Precaution Comments: does not remember he has a catheter      Mobility  Bed Mobility Overal bed mobility: Needs Assistance Bed Mobility: Sit to Supine       Sit to supine: Supervision        Transfers Overall transfer level: Needs assistance Equipment used: Rolling walker (2 wheels) Transfers: Sit to/from Stand Sit to Stand: Min guard           General transfer comment: from bed    Ambulation/Gait Ambulation/Gait assistance: Min guard Gait Distance (Feet): 150 Feet Assistive device: Rolling walker (2 wheels) Gait Pattern/deviations: Step-through pattern       General Gait Details: cues for safety  Stairs            Wheelchair Mobility    Modified Rankin (Stroke Patients Only)       Balance Overall balance assessment: Mild deficits observed, not formally tested                                           Pertinent Vitals/Pain Pain Assessment Pain Assessment: No/denies pain    Home Living                     Additional Comments: pt from [facility] independent living, has aides from [place] that come to assist with ADLs.    Prior Function Prior Level of Function : Patient poor historian/Family not available  ADLs Comments: patient  unable to recall how he gets around nor where he lives     Hand Dominance   Dominant Hand: Right    Extremity/Trunk Assessment   Upper Extremity Assessment Upper Extremity Assessment: Overall WFL for tasks assessed    Lower Extremity Assessment Lower Extremity Assessment: Overall WFL for tasks assessed    Cervical / Trunk Assessment Cervical / Trunk Assessment: Normal  Communication   Communication: HOH  Cognition Arousal/Alertness: Awake/alert Behavior  During Therapy: WFL for tasks assessed/performed Overall Cognitive Status: History of cognitive impairments - at baseline                                          General Comments      Exercises     Assessment/Plan    PT Assessment Patient needs continued PT services  PT Problem List Decreased mobility;Decreased cognition;Decreased knowledge of precautions       PT Treatment Interventions DME instruction;Gait training;Therapeutic activities;Cognitive remediation;Patient/family education    PT Goals (Current goals can be found in the Care Plan section)  Acute Rehab PT Goals Patient Stated Goal: agreed to ambulate. PT Goal Formulation: Patient unable to participate in goal setting Time For Goal Achievement: 03/26/22 Potential to Achieve Goals: Good    Frequency Min 2X/week     Co-evaluation               AM-PAC PT "6 Clicks" Mobility  Outcome Measure Help needed turning from your back to your side while in a flat bed without using bedrails?: None Help needed moving from lying on your back to sitting on the side of a flat bed without using bedrails?: None Help needed moving to and from a bed to a chair (including a wheelchair)?: A Little Help needed standing up from a chair using your arms (e.g., wheelchair or bedside chair)?: A Little Help needed to walk in hospital room?: A Little Help needed climbing 3-5 steps with a railing? : A Lot 6 Click Score: 19    End of Session Equipment Utilized During Treatment: Gait belt Activity Tolerance: Patient tolerated treatment well Patient left: in chair;with call bell/phone within reach;with chair alarm set Nurse Communication: Mobility status PT Visit Diagnosis: Unsteadiness on feet (R26.81);Difficulty in walking, not elsewhere classified (R26.2)    Time: 4536-4680 PT Time Calculation (min) (ACUTE ONLY): 30 min   Charges:   PT Evaluation $PT Eval Low Complexity: 1 Low PT Treatments $Gait Training:  8-22 mins        [place] Office [phone] Weekend [phone]   Claretha Cooper 03/12/2022, 1:01 PM

## 2022-03-12 NOTE — Progress Notes (Signed)
Mobility Specialist - Progress Note   03/12/22 0930  Mobility  Activity Ambulated with assistance in hallway  Level of Assistance Standby assist, set-up cues, supervision of patient - no hands on  Assistive Device Front wheel walker  Distance Ambulated (ft) 500 ft  Range of Motion/Exercises Active  Activity Response Tolerated well  Mobility Referral Yes  $Mobility charge 1 Mobility   Pt was found in bed and agreeable to ambulate. Had no complaints during session and at EOS returned to bed with all necessities in reach.  Ferd Hibbs Mobility Specialist

## 2022-03-12 NOTE — Progress Notes (Signed)
Patient discharged back to independent live, discharge instructions given and explained to patient/daughter; reviewed and demonstrated foley care/management to patient's daughter and she verbalized understanding, F/U with home health. Patient denies any distress, no pressure injury noted, rash on the left leg. Patient accompanied home by daughter.

## 2022-03-12 NOTE — Discharge Summary (Signed)
Physician Discharge Summary  AMILCAR REEVER KVQ:[id number] DOB: 06-Aug-1940 DOA: 03/10/2022  PCP: [name], MD  Admit date: 03/10/2022  Discharge date: 03/12/2022  Admitted From:Home  Disposition:  Home  Recommendations for Outpatient Follow-up:  Follow up with PCP in 1-2 weeks Follow-up with [facility] as scheduled on 1/8 and continue on Foley catheter Continue Augmentin for 3 more days to complete course of treatment for community-acquired pneumonia Continue home medications as prior  Home Health: Yes with PT/RN/aide ordered  Equipment/Devices: Discharge home with Foley catheter  Discharge Condition:Stable  CODE STATUS: DNR  Diet recommendation: Heart Healthy  Brief/Interim Summary: Matthew Knight is a 82 y.o. male with medical history significant for dementia, history of CVA, HTN, HLD who presented to the ED from [facility] independent living facility for evaluation of confusion and abdominal pain.  He was admitted for acute hypoxemic respiratory failure secondary to community-acquired pneumonia of the left lower lobe.  He is also noted to have some AKI likely in the setting of BPH with urinary obstruction.  Foley catheter has been placed with relief of obstruction as noted on ultrasound and AKI has subsequently resolved.  He will need to remain on his home Rapaflo and continue Foley catheter until he can have proper voiding trial with urology in the next few days as scheduled by his daughter on 1/8.  He is stable for discharge today and will continue on Augmentin as prescribed to complete course of treatment for community-acquired pneumonia.  He no longer has any oxygen requirement.  No other acute events noted throughout the course of the stay.  Discharge Diagnoses:  Principal Problem:   Community acquired pneumonia of left lower lobe of lung Active Problems:   Acute respiratory failure with hypoxia (HCC)   BPH with urinary obstruction   AKI (acute kidney  injury) ([facility])   Benign essential HTN   Dementia without behavioral disturbance (HCC)   HLD (hyperlipidemia)  Principal discharge diagnosis: Acute metabolic encephalopathy as well as acute hypoxemic respiratory failure secondary to community-acquired pneumonia to left lower lobe along with AKI in the setting of urinary obstruction with BPH.  Dementia.  Discharge Instructions  Discharge Instructions     Diet - low sodium heart healthy   Complete by: As directed    Increase activity slowly   Complete by: As directed       Allergies as of 03/12/2022       Reactions   Aspirin Other (See Comments)   "BLEEDS OUT INTERNALLY"   Nsaids Other (See Comments)   NO ANTI-COAGULANTS, EITHER!!!! "BLEEDS OUT INTERNALLY" (ONLY Tylenol is tolerated)   Darunavir Other (See Comments)   Eruption of the skin   Rivastigmine Other (See Comments)   Brand name only for the patch- "otherwise, he will have a violent reaction"   Sulfa Antibiotics Other (See Comments)   States this "made him crazy"        Medication List     TAKE these medications    acetaminophen 325 MG tablet Commonly known as: TYLENOL Take 2 tablets (650 mg total) by mouth every 4 (four) hours as needed for mild pain, moderate pain, fever or headache.   amoxicillin-clavulanate 500-125 MG tablet Commonly known as: Augmentin Take 1 tablet by mouth 3 (three) times daily for 3 days.   CertaVite/Antioxidants Tabs Take 1 tablet by mouth daily.   docusate sodium 100 MG capsule Commonly known as: COLACE Take 100 mg by mouth 2 (two) times daily.   escitalopram 20 MG  tablet Commonly known as: LEXAPRO Take 20 mg by mouth daily.   isosorbide mononitrate 30 MG 24 hr tablet Commonly known as: IMDUR Take 30 mg by mouth daily.   latanoprost 0.005 % ophthalmic solution Commonly known as: XALATAN Place 1 drop into both eyes at bedtime.   lisinopril 10 MG tablet Commonly known as: ZESTRIL Take 10 mg by mouth daily.    pantoprazole 40 MG tablet Commonly known as: PROTONIX Take 1 tablet (40 mg total) by mouth daily. What changed: when to take this   PNV FOLIC ACID + IRON PO Take 1 tablet by mouth daily with breakfast.   polyethylene glycol 17 g packet Commonly known as: MiraLax Take 17 g by mouth daily as needed for moderate constipation.   pravastatin 20 MG tablet Commonly known as: PRAVACHOL Take 20 mg by mouth daily.   Rhopressa 0.02 % Soln Generic drug: Netarsudil Dimesylate Place 1 drop into both eyes daily.   silodosin 8 MG Caps capsule Commonly known as: RAPAFLO Take 8 mg by mouth daily.   Vitamin D3 1000 units Caps Take 1,000 Units by mouth daily.        Follow-up Information     [name], MD. Schedule an appointment as soon as possible for a visit in 1 week(s).   Specialty: Family Medicine Contact information: [place] [address] [address]. Go to.   Contact information: [place] [phone]               Allergies  Allergen Reactions   Aspirin Other (See Comments)    "BLEEDS OUT INTERNALLY"   Nsaids Other (See Comments)    NO ANTI-COAGULANTS, EITHER!!!! "BLEEDS OUT INTERNALLY" (ONLY Tylenol is tolerated)   Darunavir Other (See Comments)    Eruption of the skin   Rivastigmine Other (See Comments)    Brand name only for the patch- "otherwise, he will have a violent reaction"   Sulfa Antibiotics Other (See Comments)    States this "made him crazy"    Consultations: None   Procedures/Studies: US RENAL  Result Date: 03/10/2022 CLINICAL DATA:  Acute renal injury EXAM: RENAL / URINARY TRACT ULTRASOUND COMPLETE COMPARISON:  CT from earlier in the same day. FINDINGS: Right Kidney: Renal measurements: 10.3 x 5.7 x 5.6 cm. = volume: 175 mL. Mild fullness of the collecting system is noted. Left Kidney: Renal measurements: 12.1 x 5.6 x 5.5  cm. = volume: 194 mL. Echogenicity within normal limits. No mass or hydronephrosis visualized. Bladder: Decompressed by Foley catheter. Other: None. IMPRESSION: Interval decompression of the bladder by Foley catheter. The previously seen fullness of the collecting systems has improved with only minimal fullness remaining on the right. These changes were likely accentuated by the distended bladder. No other focal abnormality is noted. Electronically Signed   By: Inez Catalina M.D.   On: 03/10/2022 20:48   CT ABDOMEN PELVIS W CONTRAST  Result Date: 03/10/2022 CLINICAL DATA:  Abdominal pain and potential urinary retention. EXAM: CT ABDOMEN AND PELVIS WITH CONTRAST TECHNIQUE: Multidetector CT imaging of the abdomen and pelvis was performed using the standard protocol following bolus administration of intravenous contrast. RADIATION DOSE REDUCTION: This exam was performed according to the departmental dose-optimization program which includes automated exposure control, adjustment of the mA and/or kV according to patient size and/or use of iterative reconstruction technique. CONTRAST:  142m OMNIPAQUE IOHEXOL 300  MG/ML  SOLN COMPARISON:  CT of the chest, abdomen and pelvis of August 31, 2021 FINDINGS: Lower chest: Calcified pleural plaques as on previous imaging. Heart size is mildly enlarged. Lower chest evaluation limited by arm position. No acute chest wall process. No consolidation or effusion. Hepatobiliary: Marked hepatic steatosis. No focal, suspicious hepatic lesion. No pericholecystic stranding or biliary duct dilation. Pancreas: Normal, without mass, inflammation or ductal dilatation. Spleen: Normal. Adrenals/Urinary Tract: 1.5 cm RIGHT adrenal adenoma shown to be less than 10 Hounsfield units on previous imaging from August 31, 2021. No dedicated follow-up imaging is recommended for this finding. Symmetric renal enhancement. Mild fullness of the RIGHT renal collecting systems and ureter in the setting of moderate  urinary bladder distension. Urinary bladder extends to the sacral promontory. Mild fullness of the LEFT ureter as well. 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 Mild perivesical stranding mainly in the space of Retzius. On image 79/3 there is 41 Hounsfield unit density of the distal RIGHT ureter which is focally dilated to approximately 9 mm duplication of the RIGHT ureter is also noted to the level of the distal RIGHT ureter likely. Stomach/Bowel: Small hiatal hernia. No perigastric stranding. No stranding adjacent to small bowel or sign of small bowel obstruction. No sign of acute colonic process. Appendix surgically absent by report. No acute colonic process. Vascular/Lymphatic: Aortic atherosclerosis. No sign of aneurysm. Smooth contour of the IVC. There is no gastrohepatic or hepatoduodenal ligament lymphadenopathy. No retroperitoneal or mesenteric lymphadenopathy. No pelvic sidewall lymphadenopathy. Reproductive: Prostatomegaly with heterogeneity of the prostate, nonspecific finding on CT. Other: No ascites or pneumoperitoneum. Musculoskeletal: No acute musculoskeletal process. Spinal degenerative changes. IMPRESSION: 1. Findings suspicious for bladder outlet obstruction with resultant bilateral ureteral dilation with mild RIGHT hydronephrosis and minimal collecting system dilation on the LEFT. Stranding adjacent to the urinary bladder may be due to bladder outlet obstruction or cystitis. There is also little if any excretion of contrast into the urinary tract implying physiologic significance of "urinary tract obstruction" despite only mild hydronephrosis currently. 2. Heterogeneity of the prostate while nonspecific shows an indistinct appearance. Correlate with any signs of prostatitis. Follow-up with urology and with PSA assessment may be helpful as well. 3. Question of focal dilation of the distal RIGHT ureter in the setting of duplicated RIGHT renal  collecting systems. Appears to be single ureter beyond the sacral promontory. Cystoscopic evaluation and ureteral correlation with direct visualization of the distal RIGHT ureter may be helpful. Enhancement could also be due to relative obstruction and perhaps concomitant urinary tract infection. Correlate also with urinalysis. 4. Marked hepatic steatosis. 5. 1.5 cm RIGHT adrenal adenoma. Based on current clinical literature, is suggested if not already performed. Please refer to current clinical guidelines for detailed recommendations. NEJM 0254:270 154-51 6. Calcified pleural plaques as on previous imaging. 7. Aortic atherosclerosis. Aortic Atherosclerosis (ICD10-I70.0). Electronically Signed   By: Zetta Bills M.D.   On: 03/10/2022 16:52   CT Head Wo Contrast  Result Date: 03/10/2022 CLINICAL DATA:  History of dementia, increased confusion EXAM: CT HEAD WITHOUT CONTRAST TECHNIQUE: Contiguous axial images were obtained from the base of the skull through the vertex without intravenous contrast. RADIATION DOSE REDUCTION: This exam was performed according to the departmental dose-optimization program which includes automated exposure control, adjustment of the mA and/or kV according to patient size and/or use of iterative reconstruction technique. COMPARISON:  07/04/2021 FINDINGS: Brain: No evidence of acute infarction, hemorrhage, mass, mass effect, or midline shift. No  hydrocephalus or extra-axial fluid collection. Cerebral atrophy with ex vacuo dilatation of the ventricles, unchanged from the prior exam. Periventricular white matter changes, likely the sequela of chronic small vessel ischemic disease. Calcifications in the bilateral thalami Vascular: No hyperdense vessel. Skull: Normal. Negative for fracture or focal lesion. Sinuses/Orbits: Clear paranasal sinuses. No acute finding in the orbits. Other: The mastoid air cells are well aerated. IMPRESSION: No acute intracranial process. Electronically Signed    By: Merilyn Baba M.D.   On: 03/10/2022 16:43   DG Chest 2 View  Result Date: 03/10/2022 CLINICAL DATA:  Shortness of breath, altered mental status EXAM: CHEST - 2 VIEW COMPARISON:  08/31/2021 and 07/05/2021 FINDINGS: Mild enlargement of the cardiopericardial silhouette noted along with bilateral pleural calcifications and underlying centrilobular emphysema. Low lung volumes are present, causing crowding of the pulmonary vasculature. Indistinct left hemidiaphragm and left heart border raising suspicion for airspace opacity in the left lower lobe and potentially the lingula. The patient is rotated to the left on today's radiograph, reducing diagnostic sensitivity and specificity. Atherosclerotic calcification of the aortic arch. Right proximal humeral fixator noted. IMPRESSION: 1. Indistinct left hemidiaphragm and left heart border raising suspicion for airspace opacity in the left lower lobe and potentially the lingula. Pneumonia is not excluded. 2. Mild enlargement of the cardiopericardial silhouette, without edema. 3. Aortic Atherosclerosis (ICD10-I70.0) and Emphysema (ICD10-J43.9). 4. 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 Electronically Signed   By: Van Clines M.D.   On: 03/10/2022 15:07     Discharge Exam: Vitals:   03/12/22 0148 03/12/22 0935  BP: 121/60 (!) 141/64  Pulse: 65   Resp: 16   Temp: 97.8 F (36.6 C)   SpO2: 96%    Vitals:   03/11/22 1744 03/11/22 2135 03/12/22 0148 03/12/22 0935  BP: (!) 125/47 (!) 113/42 121/60 (!) 141/64  Pulse: 64 71 65   Resp: '20 16 16   '$ Temp: 98 F (36.7 C) 97.7 F (36.5 C) 97.8 F (36.6 C)   TempSrc: Oral Oral Oral   SpO2: 94% 92% 96%   Weight: 107 kg     Height: 6' (1.829 m)       General: Pt is alert, awake, not in acute distress Cardiovascular: RRR, S1/S2 +, no rubs, no gallops Respiratory: CTA bilaterally, no wheezing, no rhonchi Abdominal: Soft, NT, ND, bowel sounds + Extremities: no edema,  no cyanosis    The results of significant diagnostics from this hospitalization (including imaging, microbiology, ancillary and laboratory) are listed below for reference.     Microbiology: Recent Results (from the past 240 hour(s))  Urine Culture     Status: None   Collection Time: 03/10/22  3:57 PM   Specimen: Urine, Clean Catch  Result Value Ref Range Status   Specimen Description   Final    URINE, CLEAN CATCH Performed at [facility], [place] [address], [place], [place] [address]    Special Requests   Final    NONE Performed at [facility], [place] [address] [place], [place] [address]    Culture   Final    NO GROWTH Performed at [facility], [place] [address], [place], [place] [address]    Report Status 03/11/2022 FINAL  Final  Resp panel by RT-PCR (RSV, Flu A&B, Covid)     Status: None   Collection Time: 03/10/22  3:57 PM   Specimen: Nasal Swab  Result Value Ref Range Status   SARS Coronavirus 2 by RT PCR  NEGATIVE NEGATIVE Final    Comment: (NOTE) SARS-CoV-2 target nucleic acids are NOT DETECTED.  The SARS-CoV-2 RNA is generally detectable in upper respiratory specimens during the acute phase of infection. The lowest concentration of SARS-CoV-2 viral copies this assay can detect is 138 copies/mL. A negative result does not preclude SARS-Cov-2 infection and should not be used as the sole basis for treatment or other patient management decisions. A negative result may occur with  improper specimen collection/handling, submission of specimen other than nasopharyngeal swab, presence of viral mutation(s) within the areas targeted by this assay, and inadequate number of viral copies(<138 copies/mL). A negative result must be combined with clinical observations, patient history, and epidemiological information. The expected result is Negative.  Fact Sheet for Patients:  EntrepreneurPulse.com.au  Fact Sheet for  Healthcare Providers:  IncredibleEmployment.be  This test is no t yet approved or cleared by the [place] FDA and  has been authorized for detection and/or diagnosis of SARS-CoV-2 by FDA under an Emergency Use Authorization (EUA). This EUA will remain  in effect (meaning this test can be used) for the duration of the COVID-19 declaration under Section 564(b)(1) of the Act, 21 U.S.C.section 360bbb-3(b)(1), unless the authorization is terminated  or revoked sooner.       Influenza A by PCR NEGATIVE NEGATIVE Final   Influenza B by PCR NEGATIVE NEGATIVE Final    Comment: (NOTE) The Xpert Xpress SARS-CoV-2/FLU/RSV plus assay is intended as an aid in the diagnosis of influenza from Nasopharyngeal swab specimens and should not be used as a sole basis for treatment. Nasal washings and aspirates are unacceptable for Xpert Xpress SARS-CoV-2/FLU/RSV testing.  Fact Sheet for Patients: EntrepreneurPulse.com.au  Fact Sheet for Healthcare Providers: IncredibleEmployment.be  This test is not yet approved or cleared by the [place] FDA and has been authorized for detection and/or diagnosis of SARS-CoV-2 by FDA under an Emergency Use Authorization (EUA). This EUA will remain in effect (meaning this test can be used) for the duration of the COVID-19 declaration under Section 564(b)(1) of the Act, 21 U.S.C. section 360bbb-3(b)(1), unless the authorization is terminated or revoked.     Resp Syncytial Virus by PCR NEGATIVE NEGATIVE Final    Comment: (NOTE) Fact Sheet for Patients: EntrepreneurPulse.com.au  Fact Sheet for Healthcare Providers: IncredibleEmployment.be  This test is not yet approved or cleared by the [place] FDA and has been authorized for detection and/or diagnosis of SARS-CoV-2 by FDA under an Emergency Use Authorization (EUA). This EUA will remain in effect (meaning this  test can be used) for the duration of the COVID-19 declaration under Section 564(b)(1) of the Act, 21 U.S.C. section 360bbb-3(b)(1), unless the authorization is terminated or revoked.  Performed at [facility], [place] [address], [place], [place] [address]      Labs: BNP (last 3 results) No results for input(s): "BNP" in the last 8760 hours. Basic Metabolic Panel: Recent Labs  Lab 03/10/22 1525 03/11/22 0340  NA 140 142  K 4.1 3.6  CL 108 111  CO2 21* 23  GLUCOSE 144* 103*  BUN 23 21  CREATININE 1.51* 1.20  CALCIUM 9.1 8.9   Liver Function Tests: Recent Labs  Lab 03/10/22 1525  AST 28  ALT 39  ALKPHOS 64  BILITOT 1.1  PROT 7.4  ALBUMIN 4.3   Recent Labs  Lab 03/10/22 1525  LIPASE 31   Recent Labs  Lab 03/10/22 1525  AMMONIA 22   CBC: Recent Labs  Lab 03/10/22 1525 03/11/22 0340  WBC 15.9* 13.2*  NEUTROABS 14.2*  --   HGB 14.2 13.9  HCT 43.1 42.1  MCV 88.7 89.8  PLT 302 278   Cardiac Enzymes: No results for input(s): "CKTOTAL", "CKMB", "CKMBINDEX", "TROPONINI" in the last 168 hours. BNP: Invalid input(s): "POCBNP" CBG: No results for input(s): "GLUCAP" in the last 168 hours. D-Dimer No results for input(s): "DDIMER" in the last 72 hours. Hgb A1c No results for input(s): "HGBA1C" in the last 72 hours. Lipid Profile No results for input(s): "CHOL", "HDL", "LDLCALC", "TRIG", "CHOLHDL", "LDLDIRECT" in the last 72 hours. Thyroid function studies No results for input(s): "TSH", "T4TOTAL", "T3FREE", "THYROIDAB" in the last 72 hours.  Invalid input(s): "FREET3" Anemia work up No results for input(s): "VITAMINB12", "FOLATE", "FERRITIN", "TIBC", "IRON", "RETICCTPCT" in the last 72 hours. Urinalysis    Component Value Date/Time   COLORURINE YELLOW 03/10/2022 1557   APPEARANCEUR CLEAR 03/10/2022 1557   LABSPEC 1.020 03/10/2022 1557   PHURINE 5.0 03/10/2022 1557   GLUCOSEU NEGATIVE 03/10/2022 1557   HGBUR SMALL (A) 03/10/2022  1557   BILIRUBINUR NEGATIVE 03/10/2022 1557   KETONESUR NEGATIVE 03/10/2022 1557   PROTEINUR NEGATIVE 03/10/2022 1557   NITRITE NEGATIVE 03/10/2022 1557   LEUKOCYTESUR NEGATIVE 03/10/2022 1557   Sepsis Labs Recent Labs  Lab 03/10/22 1525 03/11/22 0340  WBC 15.9* 13.2*   Microbiology Recent Results (from the past 240 hour(s))  Urine Culture     Status: None   Collection Time: 03/10/22  3:57 PM   Specimen: Urine, Clean Catch  Result Value Ref Range Status   Specimen Description   Final    URINE, CLEAN CATCH Performed at [facility], [place] [address], [place], [place] [address]    Special Requests   Final    NONE Performed at [facility], [place] [address], [place], [place] [address]    Culture   Final    NO GROWTH Performed at [facility], [place] [address], [place],  [address]    Report Status 03/11/2022 FINAL  Final  Resp panel by RT-PCR (RSV, Flu A&B, Covid)     Status: None   Collection Time: 03/10/22  3:57 PM   Specimen: Nasal Swab  Result Value Ref Range Status   SARS Coronavirus 2 by RT PCR NEGATIVE NEGATIVE Final    Comment: (NOTE) SARS-CoV-2 target nucleic acids are NOT DETECTED.  The SARS-CoV-2 RNA is generally detectable in upper respiratory specimens during the acute phase of infection. The lowest concentration of SARS-CoV-2 viral copies this assay can detect is 138 copies/mL. A negative result does not preclude SARS-Cov-2 infection and should not be used as the sole basis for treatment or other patient management decisions. A negative result may occur with  improper specimen collection/handling, submission of specimen other than nasopharyngeal swab, presence of viral mutation(s) within the areas targeted by this assay, and inadequate number of viral copies(<138 copies/mL). A negative result must be combined with clinical observations, patient history, and epidemiological information. The expected result is  Negative.  Fact Sheet for Patients:  EntrepreneurPulse.com.au  Fact Sheet for Healthcare Providers:  IncredibleEmployment.be  This test is no t yet approved or cleared by the [place] FDA and  has been authorized for detection and/or diagnosis of SARS-CoV-2 by FDA under an Emergency Use Authorization (EUA). This EUA will remain  in effect (meaning this test can be used) for the duration of the COVID-19 declaration under Section 564(b)(1) of the Act, 21 U.S.C.section 360bbb-3(b)(1), unless the authorization is terminated  or revoked sooner.       Influenza A by PCR NEGATIVE NEGATIVE Final   Influenza B by PCR NEGATIVE NEGATIVE Final    Comment: (NOTE) The Xpert Xpress SARS-CoV-2/FLU/RSV plus assay is intended as an aid in the diagnosis of influenza from Nasopharyngeal swab specimens and should not be used as a sole basis for treatment. Nasal washings and aspirates are unacceptable for Xpert Xpress SARS-CoV-2/FLU/RSV testing.  Fact Sheet for Patients: EntrepreneurPulse.com.au  Fact Sheet for Healthcare Providers: IncredibleEmployment.be  This test is not yet approved or cleared by the [place] FDA and has been authorized for detection and/or diagnosis of SARS-CoV-2 by FDA under an Emergency Use Authorization (EUA). This EUA will remain in effect (meaning this test can be used) for the duration of the COVID-19 declaration under Section 564(b)(1) of the Act, 21 U.S.C. section 360bbb-3(b)(1), unless the authorization is terminated or revoked.     Resp Syncytial Virus by PCR NEGATIVE NEGATIVE Final    Comment: (NOTE) Fact Sheet for Patients: EntrepreneurPulse.com.au  Fact Sheet for Healthcare Providers: IncredibleEmployment.be  This test is not yet approved or cleared by the [place] FDA and has been authorized for detection and/or diagnosis of  SARS-CoV-2 by FDA under an Emergency Use Authorization (EUA). This EUA will remain in effect (meaning this test can be used) for the duration of the COVID-19 declaration under Section 564(b)(1) of the Act, 21 U.S.C. section 360bbb-3(b)(1), unless the authorization is terminated or revoked.  Performed at [facility], [place] [address], [place], [place] [address]      Time coordinating discharge: 35 minutes  SIGNED:   Rodena Goldmann, DO [facility] 03/12/2022, 11:00 AM  If 7PM-7AM, please contact night-coverage [email or web address]

## 2022-03-12 NOTE — TOC Transition Note (Addendum)
Transition of Care [facility] - CM/SW Discharge Note   Patient Details  Name: BECKER CHRISTOPHER MRN: [id number] Date of Birth: 12/10/1940  Transition of Care [facility]) CM/SW Contact:  Dessa Phi, RN Phone Number: 03/12/2022, 12:24 PM   Clinical Narrative: Damaris Schooner to dtr Seth Bake about d/c plans. Patient from [place] for return-dtr will transport on own. HHRN-f/c care/PT/OT/aide. Already has [place] duty care-dtr will f/u for additional hours of private duty. No further CM needs.  -12:34p-Received call from [place] will fax [place] orders [phone]. [facility] rep Claiborne Billings aware of [place] services.andrea(dtr) wil f[facility] w[facility] for [facility].No further CM needs.  -12:54p-spoke to dtr Seth Bake about rw-patient already has rw-order cancelled. No further CM needs.    Final next level of care: Home w Home Health Services Barriers to Discharge: No Barriers Identified   Patient Goals and CMS Choice CMS Medicare.gov Compare Post Acute Care list provided to:: Patient Represenative (must comment) (Andrea(dtr)) Choice offered to / list presented to : Adult Children  Discharge Placement                         Discharge Plan and Services Additional resources added to the After Visit Summary for     Discharge Planning Services: CM Consult Post Acute Care Choice: Home Health                    HH Arranged: RN, OT, Nurse's Aide, PT [facility] Agency: [place] Date [place]: 03/12/22 Time [place]: 1223 Representative spoke with at [place]: [place] Determinants of Health ([place]) Interventions SDOH Screenings   Tobacco Use: High Risk (03/10/2022)     Readmission Risk Interventions     No data to display

## 2022-03-12 NOTE — TOC Progression Note (Signed)
Transition of Care [facility]) - Progression Note    Patient Details  Name: Matthew Knight MRN: [id number] Date of Birth: 02-15-1941  Transition of Care [facility]) CM/SW [place], LCSW Phone Number: 03/12/2022, 12:18 PM  Clinical Narrative:     CSW confirmed pt [place] services is arranged with [place]. No additional TOC needs.   Expected Discharge Plan: Home/Self Care Barriers to Discharge: Continued Medical Work up  Expected Discharge Plan and Services   Discharge Planning Services: CM Consult   Living arrangements for the past 2 months: Single Family Home Expected Discharge Date: 03/12/22                                     Social Determinants of Health (SDOH) Interventions SDOH Screenings   Tobacco Use: High Risk (03/10/2022)    Readmission Risk Interventions     No data to display

## 2022-03-12 NOTE — TOC Initial Note (Signed)
Transition of Care [facility]) - Initial/Assessment Note    Patient Details  Name: Matthew Knight MRN: [id number] Date of Birth: 05-17-1940  Transition of Care [facility]) CM/SW Contact:    Leeroy Cha, RN Phone Number: 03/12/2022, 7:38 AM  Clinical Narrative:                 Smoker will add smoking cessation information to dc instructions.  Expected Discharge Plan: Home/Self Care Barriers to Discharge: Continued Medical Work up   Patient Goals and CMS Choice Patient states their goals for this hospitalization and ongoing recovery are:: to return to my home and be well CMS Medicare.gov Compare Post Acute Care list provided to:: Patient        Expected Discharge Plan and Services   Discharge Planning Services: CM Consult   Living arrangements for the past 2 months: Single Family Home                                      Prior Living Arrangements/Services Living arrangements for the past 2 months: Single Family Home Lives with:: Self Patient language and need for interpreter reviewed:: Yes Do you feel safe going back to the place where you live?: Yes            Criminal Activity/Legal Involvement Pertinent to Current Situation/Hospitalization: No - Comment as needed  Activities of Daily Living   ADL Screening (condition at time of admission) Is the patient deaf or have difficulty hearing?: Yes Does the patient have difficulty seeing, even when wearing glasses/contacts?: No Does the patient have difficulty concentrating, remembering, or making decisions?: Yes Does the patient have difficulty dressing or bathing?: Yes Does the patient have difficulty walking or climbing stairs?: Yes  Permission Sought/Granted                  Emotional Assessment Appearance:: Appears stated age Attitude/Demeanor/Rapport: Engaged Affect (typically observed): Appropriate Orientation: : Oriented to Self, Oriented to Place, Oriented to  Time, Oriented to  Situation Alcohol / Substance Use: Tobacco Use (current pipe smoker) Psych Involvement: No (comment)  Admission diagnosis:  Urinary retention [R33.9] Acute respiratory failure with hypoxia ([place]) [J96.01] Altered mental status, unspecified altered mental status type [R41.82] Community acquired pneumonia of left lower lobe of lung [J18.9] Community acquired pneumonia, unspecified laterality [J18.9] Patient Active Problem List   Diagnosis Date Noted   Community acquired pneumonia of left lower lobe of lung 03/10/2022   Acute respiratory failure with hypoxia ([place]) 03/10/2022   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   AKI (acute kidney injury) ([place]) 03/10/2022   Rectal bleeding 07/02/2021   GIB (gastrointestinal bleeding) 07/01/2021   BPH (benign prostatic hyperplasia) 07/01/2021   HLD (hyperlipidemia) 07/01/2021   Iron deficiency anemia due to chronic blood loss    Symptomatic anemia    GI bleed 03/19/2021   Leukocytosis 03/19/2021   Renal insufficiency 03/19/2021   Fall at home, initial encounter 03/19/2021   Heme positive stool    Acute blood loss anemia    Platelet inhibition due to Plavix    Orthostatic hypotension 03/10/2021   Acute left-sided weakness 03/09/2021   Closed fracture distal radius and ulna, left, sequela 03/09/2021   History of TIA (transient ischemic attack) 03/09/2021   Abnormal ECG 04/28/2020   Snoring 04/28/2020   Anxiety 11/22/2018   Dementia without behavioral disturbance ([place]) 11/22/2018   Depression 11/22/2018   Exposure to [employer]  11/22/2018   Small vessel disease, 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   Dizziness 10/24/2018   Benign essential HTN 11/11/2017   Bradycardia 11/11/2017   Near syncope 11/10/2017   Onychomycosis 04/19/2017   Memory loss 01/22/2017   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   Iliac artery aneurysm, 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 - [address] [place] [address] Phone: [phone] Fax: [place] #[id number] - [address], [address] AT [facility] [place] [address] Phone: [phone] Fax: [phone]     Social Determinants of Health ([place]) Social History: SDOH Screenings   Tobacco Use: High Risk (03/10/2022)   SDOH Interventions:     Readmission Risk Interventions   No data to display

## 2022-03-23 ENCOUNTER — Other Ambulatory Visit: Payer: Self-pay

## 2022-03-23 ENCOUNTER — Encounter (HOSPITAL_COMMUNITY): Payer: Self-pay

## 2022-03-23 ENCOUNTER — Emergency Department (HOSPITAL_COMMUNITY): Payer: Medicare Other

## 2022-03-23 ENCOUNTER — Inpatient Hospital Stay (HOSPITAL_COMMUNITY)
Admission: EM | Admit: 2022-03-23 | Discharge: 2022-03-25 | DRG: 726 | Disposition: A | Discharge status: Home Health | Payer: Medicare Other | Source: Skilled Nursing Facility | Attending: Internal Medicine | Admitting: Internal Medicine

## 2022-03-23 DIAGNOSIS — E669 Obesity, unspecified: Secondary | ICD-10-CM | POA: Diagnosis present

## 2022-03-23 DIAGNOSIS — Z833 Family history of diabetes mellitus: Secondary | ICD-10-CM

## 2022-03-23 DIAGNOSIS — Z6831 Body mass index (BMI) 31.0-31.9, adult: Secondary | ICD-10-CM

## 2022-03-23 DIAGNOSIS — N390 Urinary tract infection, site not specified: Secondary | ICD-10-CM | POA: Diagnosis present

## 2022-03-23 DIAGNOSIS — Z79899 Other long term (current) drug therapy: Secondary | ICD-10-CM

## 2022-03-23 DIAGNOSIS — E860 Dehydration: Secondary | ICD-10-CM | POA: Diagnosis present

## 2022-03-23 DIAGNOSIS — N179 Acute kidney failure, unspecified: Secondary | ICD-10-CM | POA: Diagnosis present

## 2022-03-23 DIAGNOSIS — E871 Hypo-osmolality and hyponatremia: Secondary | ICD-10-CM | POA: Diagnosis present

## 2022-03-23 DIAGNOSIS — F0390 Unspecified dementia without behavioral disturbance: Secondary | ICD-10-CM | POA: Diagnosis present

## 2022-03-23 DIAGNOSIS — E785 Hyperlipidemia, unspecified: Secondary | ICD-10-CM | POA: Diagnosis present

## 2022-03-23 DIAGNOSIS — G3184 Mild cognitive impairment, so stated: Secondary | ICD-10-CM | POA: Diagnosis present

## 2022-03-23 DIAGNOSIS — E876 Hypokalemia: Secondary | ICD-10-CM | POA: Diagnosis present

## 2022-03-23 DIAGNOSIS — Z8673 Personal history of transient ischemic attack (TIA), and cerebral infarction without residual deficits: Secondary | ICD-10-CM

## 2022-03-23 DIAGNOSIS — F17290 Nicotine dependence, other tobacco product, uncomplicated: Secondary | ICD-10-CM | POA: Diagnosis present

## 2022-03-23 DIAGNOSIS — I10 Essential (primary) hypertension: Secondary | ICD-10-CM | POA: Diagnosis present

## 2022-03-23 DIAGNOSIS — N401 Enlarged prostate with lower urinary tract symptoms: Secondary | ICD-10-CM | POA: Diagnosis present

## 2022-03-23 DIAGNOSIS — Z882 Allergy status to sulfonamides status: Secondary | ICD-10-CM

## 2022-03-23 DIAGNOSIS — R339 Retention of urine, unspecified: Principal | ICD-10-CM

## 2022-03-23 DIAGNOSIS — N139 Obstructive and reflux uropathy, unspecified: Secondary | ICD-10-CM | POA: Diagnosis present

## 2022-03-23 DIAGNOSIS — Z886 Allergy status to analgesic agent status: Secondary | ICD-10-CM | POA: Diagnosis not present

## 2022-03-23 DIAGNOSIS — N138 Other obstructive and reflux uropathy: Secondary | ICD-10-CM | POA: Diagnosis present

## 2022-03-23 DIAGNOSIS — R310 Gross hematuria: Secondary | ICD-10-CM | POA: Diagnosis present

## 2022-03-23 DIAGNOSIS — Z888 Allergy status to other drugs, medicaments and biological substances status: Secondary | ICD-10-CM

## 2022-03-23 DIAGNOSIS — Z8249 Family history of ischemic heart disease and other diseases of the circulatory system: Secondary | ICD-10-CM

## 2022-03-23 DIAGNOSIS — H9190 Unspecified hearing loss, unspecified ear: Secondary | ICD-10-CM | POA: Diagnosis present

## 2022-03-23 DIAGNOSIS — Z66 Do not resuscitate: Secondary | ICD-10-CM | POA: Diagnosis present

## 2022-03-23 DIAGNOSIS — E119 Type 2 diabetes mellitus without complications: Secondary | ICD-10-CM | POA: Diagnosis present

## 2022-03-23 LAB — COMPREHENSIVE METABOLIC PANEL
ALT: 24 U/L (ref 0–44)
AST: 25 U/L (ref 15–41)
Albumin: 3.6 g/dL (ref 3.5–5.0)
Alkaline Phosphatase: 64 U/L (ref 38–126)
Anion gap: 10 (ref 5–15)
BUN: 53 mg/dL — ABNORMAL HIGH (ref 8–23)
CO2: 21 mmol/L — ABNORMAL LOW (ref 22–32)
Calcium: 8.1 mg/dL — ABNORMAL LOW (ref 8.9–10.3)
Chloride: 101 mmol/L (ref 98–111)
Creatinine, Ser: 6.29 mg/dL — ABNORMAL HIGH (ref 0.61–1.24)
GFR, Estimated: 8 mL/min — ABNORMAL LOW (ref >60)
Glucose, Bld: 130 mg/dL — ABNORMAL HIGH (ref 70–99)
Potassium: 3.4 mmol/L — ABNORMAL LOW (ref 3.5–5.1)
Sodium: 132 mmol/L — ABNORMAL LOW (ref 135–145)
Total Bilirubin: 0.9 mg/dL (ref 0.3–1.2)
Total Protein: 7.1 g/dL (ref 6.5–8.1)

## 2022-03-23 LAB — URINALYSIS, ROUTINE W REFLEX MICROSCOPIC
Bilirubin Urine: NEGATIVE
Glucose, UA: NEGATIVE mg/dL
Ketones, ur: NEGATIVE mg/dL
Leukocytes,Ua: NEGATIVE
Nitrite: NEGATIVE
Protein, ur: 30 mg/dL — AB
RBC / HPF: >50 RBC/hpf — ABNORMAL HIGH (ref 0–5)
Specific Gravity, Urine: 1.013 (ref 1.005–1.030)
pH: 5 (ref 5.0–8.0)

## 2022-03-23 LAB — CBC WITH DIFFERENTIAL/PLATELET
Abs Immature Granulocytes: 0.13 10*3/uL — ABNORMAL HIGH (ref 0.00–0.07)
Basophils Absolute: 0.1 10*3/uL (ref 0.0–0.1)
Basophils Relative: 0 %
Eosinophils Absolute: 0 10*3/uL (ref 0.0–0.5)
Eosinophils Relative: 0 %
HCT: 41 % (ref 39.0–52.0)
Hemoglobin: 13.8 g/dL (ref 13.0–17.0)
Immature Granulocytes: 1 %
Lymphocytes Relative: 6 %
Lymphs Abs: 1.4 10*3/uL (ref 0.7–4.0)
MCH: 29.5 pg (ref 26.0–34.0)
MCHC: 33.7 g/dL (ref 30.0–36.0)
MCV: 87.6 fL (ref 80.0–100.0)
Monocytes Absolute: 2.1 10*3/uL — ABNORMAL HIGH (ref 0.1–1.0)
Monocytes Relative: 9 %
Neutro Abs: 19.8 10*3/uL — ABNORMAL HIGH (ref 1.7–7.7)
Neutrophils Relative %: 84 %
Platelets: 390 10*3/uL (ref 150–400)
RBC: 4.68 MIL/uL (ref 4.22–5.81)
RDW: 13 % (ref 11.5–15.5)
WBC: 23.5 10*3/uL — ABNORMAL HIGH (ref 4.0–10.5)
nRBC: 0 % (ref 0.0–0.2)

## 2022-03-23 LAB — CBG MONITORING, ED: Glucose-Capillary: 123 mg/dL — ABNORMAL HIGH (ref 70–99)

## 2022-03-23 MED ORDER — SODIUM CHLORIDE 0.9 % IV SOLN
1.0000 g | INTRAVENOUS | Status: DC
  Administered 2022-03-24 (×2): 1 g via INTRAVENOUS
  Filled 2022-03-23 (×2): qty 10

## 2022-03-23 MED ORDER — NETARSUDIL DIMESYLATE 0.02 % OP SOLN: 1.0000 [drp] | Freq: Every day | OPHTHALMIC | Status: DC

## 2022-03-23 MED ORDER — FENTANYL CITRATE PF 50 MCG/ML IJ SOSY: 25.0000 ug | PREFILLED_SYRINGE | Freq: Once | INTRAMUSCULAR | Status: DC

## 2022-03-23 MED ORDER — ESCITALOPRAM OXALATE 10 MG PO TABS
20.0000 mg | ORAL_TABLET | Freq: Every day | ORAL | Status: DC
  Administered 2022-03-24 – 2022-03-25 (×2): 20 mg via ORAL
  Filled 2022-03-23 (×2): qty 2

## 2022-03-23 MED ORDER — LATANOPROST 0.005 % OP SOLN
1.0000 [drp] | Freq: Every day | OPHTHALMIC | Status: DC
  Administered 2022-03-24: 1 [drp] via OPHTHALMIC
  Filled 2022-03-23: qty 2.5

## 2022-03-23 MED ORDER — ACETAMINOPHEN 325 MG PO TABS
650.0000 mg | ORAL_TABLET | ORAL | Status: DC | PRN
  Administered 2022-03-24: 650 mg via ORAL
  Filled 2022-03-23: qty 2

## 2022-03-23 MED ORDER — TAMSULOSIN HCL 0.4 MG PO CAPS
0.4000 mg | ORAL_CAPSULE | Freq: Every day | ORAL | Status: DC
  Administered 2022-03-24 – 2022-03-25 (×2): 0.4 mg via ORAL
  Filled 2022-03-23 (×2): qty 1

## 2022-03-23 MED ORDER — HEPARIN SODIUM (PORCINE) 5000 UNIT/ML IJ SOLN
5000.0000 [IU] | Freq: Three times a day (TID) | INTRAMUSCULAR | Status: DC
  Filled 2022-03-23: qty 1

## 2022-03-23 MED ORDER — FENTANYL CITRATE PF 50 MCG/ML IJ SOSY
50.0000 ug | PREFILLED_SYRINGE | Freq: Once | INTRAMUSCULAR | Status: AC
  Administered 2022-03-23: 50 ug via INTRAMUSCULAR

## 2022-03-23 MED ORDER — ISOSORBIDE MONONITRATE ER 30 MG PO TB24
30.0000 mg | ORAL_TABLET | Freq: Every day | ORAL | Status: DC
  Administered 2022-03-24: 30 mg via ORAL
  Filled 2022-03-23 (×2): qty 1

## 2022-03-23 MED ORDER — SODIUM CHLORIDE 0.9 % IV SOLN: INTRAVENOUS | Status: DC

## 2022-03-23 MED ORDER — PRAVASTATIN SODIUM 20 MG PO TABS
20.0000 mg | ORAL_TABLET | Freq: Every day | ORAL | Status: DC
  Administered 2022-03-24 – 2022-03-25 (×2): 20 mg via ORAL
  Filled 2022-03-23 (×2): qty 1

## 2022-03-23 MED ORDER — SODIUM CHLORIDE 0.9 % IV SOLN: 1.0000 g | Freq: Once | INTRAVENOUS | Status: DC

## 2022-03-23 MED ORDER — FENTANYL CITRATE PF 50 MCG/ML IJ SOSY
25.0000 ug | PREFILLED_SYRINGE | Freq: Once | INTRAMUSCULAR | Status: DC
  Filled 2022-03-23: qty 1

## 2022-03-23 MED ORDER — ONDANSETRON HCL 4 MG PO TABS: 4.0000 mg | ORAL_TABLET | Freq: Four times a day (QID) | ORAL | Status: DC | PRN

## 2022-03-23 MED ORDER — PANTOPRAZOLE SODIUM 40 MG PO TBEC
40.0000 mg | DELAYED_RELEASE_TABLET | Freq: Every day | ORAL | Status: DC
  Administered 2022-03-24 – 2022-03-25 (×2): 40 mg via ORAL
  Filled 2022-03-23 (×2): qty 1

## 2022-03-23 MED ORDER — ONDANSETRON HCL 4 MG/2ML IJ SOLN: 4.0000 mg | Freq: Four times a day (QID) | INTRAMUSCULAR | Status: DC | PRN

## 2022-03-23 NOTE — ED Notes (Signed)
Will insert foley per PA

## 2022-03-23 NOTE — ED Provider Notes (Incomplete)
[place] [place] DEPT Provider Note   CSN: [id number] Arrival date & time: 03/23/22  1451     History {Add pertinent medical, surgical, social history, OB history to HPI:1} Chief Complaint  Patient presents with  . Urinary Retention    LEVEL 5 CAVEAT 2/2 DEMENTIA  Matthew Knight is a 82 y.o. male.  82 y/o male presents to the emergency department from his skilled nursing facility for urinary retention.  He had a Foley catheter placed during a recent admission for acute urinary retention felt secondary to BPH.  This was removed by [facility] on 03/19/2022 following initiation of Rapaflo.  Per family, patient did complete a 3 day course of abx following foley catheter removal. He began to experience difficulty urinating again approximately 2 days ago.  Patient denies abdominal pain, back pain. No known fevers at the facility PTA.  Bladder scan with 646m when assessed in triage.  The history is provided by the patient and a relative. No language interpreter was used.       Home Medications Prior to Admission medications   Medication Sig Start Date End Date Taking? Authorizing Provider  acetaminophen (TYLENOL) 325 MG tablet Take 2 tablets (650 mg total) by mouth every 4 (four) hours as needed for mild pain, moderate pain, fever or headache. 03/11/21   GPatrecia Pour MD  Cholecalciferol (VITAMIN D3) 1000 units CAPS Take 1,000 Units by mouth daily.    [provider]  Docusate Sodium (DSS) 100 MG CAPS Take 100 mg by mouth 2 (two) times daily. Patient not taking: Reported on 03/10/2022 07/10/21   SRaiford NobleLatif, DO  escitalopram (LEXAPRO) 20 MG tablet Take 20 mg by mouth daily. 07/11/17   [provider]  isosorbide mononitrate (IMDUR) 30 MG 24 hr tablet Take 30 mg by mouth daily. 04/28/20   [provider]  latanoprost (XALATAN) 0.005 % ophthalmic solution Place 1 drop into both eyes at bedtime.  07/27/17   [provider]  lisinopril (ZESTRIL) 10 MG tablet Take 10 mg by mouth daily. 02/01/20   [provider]  Multiple Vitamin (MULTIVITAMIN WITH MINERALS) TABS tablet Take 1 tablet by mouth daily. Patient not taking: Reported on 03/10/2022 07/11/21   SRaiford NobleLatif, DO  pantoprazole (PROTONIX) 40 MG tablet Take 1 tablet (40 mg total) by mouth daily. Patient taking differently: Take 40 mg by mouth daily before breakfast. 07/11/21   SRaiford NobleLatif, DO  polyethylene glycol (MIRALAX) 17 g packet Take 17 g by mouth daily as needed for moderate constipation. Patient not taking: Reported on 03/10/2022 07/04/21   LOswald Hillock MD  pravastatin (PRAVACHOL) 20 MG tablet Take 20 mg by mouth daily. 04/28/20   [provider]  Prenatal Vit-Fe Fumarate-FA (PNV FOLIC ACID + IRON PO) Take 1 tablet by mouth daily with breakfast.    [provider]  RHOPRESSA 0.02 % SOLN Place 1 drop into both eyes daily. 11/15/18   [provider]  silodosin (RAPAFLO) 8 MG CAPS capsule Take 8 mg by mouth daily. 06/13/21   [provider]      Allergies    Aspirin, Nsaids, Darunavir, Rivastigmine, and Sulfa antibiotics    Review of Systems   Review of Systems  Unable to perform ROS: Dementia    Physical Exam Updated Vital Signs BP 130/68   Pulse 87   Temp (!) 97.4 F (36.3 C) (Oral)   Resp 18   Wt 107 kg   SpO2 99%  BMI 31.99 kg/m   Physical Exam Vitals and nursing note reviewed.  Constitutional:      General: He is not in acute distress.    Appearance: He is well-developed. He is not diaphoretic.     Comments: Nontoxic appearing and in NAD. Requesting a quarter pounder with fries.  HENT:     Head: Normocephalic and atraumatic.  Eyes:     General: No scleral icterus.    Conjunctiva/sclera: Conjunctivae normal.  Cardiovascular:     Rate and Rhythm: Normal rate and regular rhythm.     Pulses: Normal pulses.  Pulmonary:     Effort: Pulmonary effort is normal. No respiratory  distress.     Comments: Respirations even and unlabored Abdominal:     Comments: Soft, nontender abdomen.  Musculoskeletal:        General: Normal range of motion.     Cervical back: Normal range of motion.  Skin:    General: Skin is warm and dry.     Coloration: Skin is not pale.     Findings: No erythema or rash.  Neurological:     Mental Status: He is alert and oriented to person, place, and time.     Coordination: Coordination normal.  Psychiatric:        Behavior: Behavior normal.     ED Results / Procedures / Treatments   Labs (all labs ordered are listed, but only abnormal results are displayed) Labs Reviewed  URINALYSIS, ROUTINE W REFLEX MICROSCOPIC - Abnormal; Notable for the following components:      Result Value   APPearance CLOUDY (*)    Hgb urine dipstick LARGE (*)    Protein, ur 30 (*)    RBC / HPF >50 (*)    Bacteria, UA RARE (*)    All other components within normal limits  COMPREHENSIVE METABOLIC PANEL - Abnormal; Notable for the following components:   Sodium 132 (*)    Potassium 3.4 (*)    CO2 21 (*)    Glucose, Bld 130 (*)    BUN 53 (*)    Creatinine, Ser 6.29 (*)    Calcium 8.1 (*)    GFR, Estimated 8 (*)    All other components within normal limits  CBC WITH DIFFERENTIAL/PLATELET - Abnormal; Notable for the following components:   WBC 23.5 (*)    Neutro Abs 19.8 (*)    Monocytes Absolute 2.1 (*)    Abs Immature Granulocytes 0.13 (*)    All other components within normal limits  CBG MONITORING, ED - Abnormal; Notable for the following components:   Glucose-Capillary 123 (*)    All other components within normal limits  URINE CULTURE  CBC  CREATININE, SERUM  CBC  COMPREHENSIVE METABOLIC PANEL    EKG None  Radiology US Renal  Result Date: 03/23/2022 CLINICAL DATA:  Urinary retention, elevated creatinine EXAM: RENAL / URINARY TRACT ULTRASOUND COMPLETE COMPARISON:  03/10/2022 FINDINGS: Right Kidney: Renal measurements: 10.6 x 5.9 x 6.9  cm = volume: 215 mL. Mild pelviectasis. Normal echotexture. No mass. Left Kidney: Renal measurements: 11.5 x 6.2 x 5.9 cm = volume: 217 mL. Echogenicity within normal limits. No mass or hydronephrosis visualized. Bladder: Decompressed with Foley catheter in place. Other: None. IMPRESSION: Mild right pelviectasis, similar to prior study. No acute findings. Electronically Signed   By: Rolm Baptise M.D.   On: 03/23/2022 23:39    Procedures Procedures  {Document cardiac monitor, telemetry assessment procedure when appropriate:1}  Medications Ordered in ED Medications  heparin  injection 5,000 Units (has no administration in time range)  ondansetron (ZOFRAN) tablet 4 mg (has no administration in time range)    Or  ondansetron (ZOFRAN) injection 4 mg (has no administration in time range)  0.9 %  sodium chloride infusion (has no administration in time range)  cefTRIAXone (ROCEPHIN) 1 g in sodium chloride 0.9 % 100 mL IVPB (has no administration in time range)  latanoprost (XALATAN) 0.005 % ophthalmic solution 1 drop (has no administration in time range)  Netarsudil Dimesylate 0.02 % SOLN 1 drop (has no administration in time range)  isosorbide mononitrate (IMDUR) 24 hr tablet 30 mg (has no administration in time range)  pravastatin (PRAVACHOL) tablet 20 mg (has no administration in time range)  escitalopram (LEXAPRO) tablet 20 mg (has no administration in time range)  acetaminophen (TYLENOL) tablet 650 mg (has no administration in time range)  pantoprazole (PROTONIX) EC tablet 40 mg (has no administration in time range)  tamsulosin (FLOMAX) capsule 0.4 mg (has no administration in time range)  fentaNYL (SUBLIMAZE) injection 50 mcg (50 mcg Intramuscular Given 03/23/22 1621)    ED Course/ Medical Decision Making/ A&P   {   Click here for ABCD2, HEART and other calculatorsREFRESH Note before signing :1}                          Medical Decision Making Amount and/or Complexity of Data  Reviewed Radiology: ordered.  Risk Decision regarding hospitalization.   This patient presents to the ED for concern of urinary retention, this involves an extensive number of treatment options, and is a complaint that carries with it a high risk of complications and morbidity.  The differential diagnosis includes BPH vs UTI vs urethral stone    Co morbidities that complicate the patient evaluation  Dementia    Additional history obtained:  Additional history obtained from family, at bedside External records from outside source obtained and reviewed including renal US from 03/10/22.   Lab Tests:  I Ordered, and personally interpreted labs.  The pertinent results include:  Leukocytosis of 23.5 (up from 13.2 on 03/11/22), Creatinine of 6.29 (up from 1.2 on 03/11/22), UA with gross hematuria and 11-20 WBCs. Rare bacteriuria   Imaging Studies ordered:  I ordered imaging studies including US Renal  I independently visualized and interpreted imaging which showed mild right pelviectasis which is unchanged from prior. I agree with the radiologist interpretation   Cardiac Monitoring:  The patient was maintained on a cardiac monitor.  I personally viewed and interpreted the cardiac monitored which showed an underlying rhythm of: NSR   Medicines ordered and prescription drug management:  I ordered medication including Rocephin for coverage of UTI given hematuria, pyuria, worsening leukocytosis.  Reevaluation of the patient after these medicines showed that the patient stayed the same I have reviewed the patients home medicines and have made adjustments as needed   Test Considered:  CT abdomen/pelvis   Critical Interventions:  Insertion of foley catheter   Consultations Obtained:  I requested consultation with Dr. Jonelle Sidle of the hospitalist service and discussed lab and imaging findings as well as pertinent plan - they agree with plan for admission   Problem List / ED  Course:  Patient presenting for worsening urinary retention.  Began to have difficulty voiding approximately 2 days ago.  Had a Foley catheter removed on Friday.  His bladder scan in triage resulted with 650 cc in the bladder. Gross hematuria noted upon placement of Foley  catheter.  No clots noted.  The patient is not chronically anticoagulated. Urinalysis noted to have pyuria and gross hematuria.  Given worsening leukocytosis, patient ordered to receive IV Rocephin for coverage of urinary tract infection.  Urine culture has been ordered and is pending. Of note, the patient has been found to have a new acute kidney injury with rising creatinine from 1.2 up to 6.29.  His GFR has dropped from >60 down to 8 today.  Suspect these changes to be related to urinary obstruction and retention.  Renal ultrasound appears stable compared to 2 weeks ago. Patient will be admitted for ongoing trending of his creatinine value pending urine culture results.  Case discussed with Dr. Jonelle Sidle who will admit.   Reevaluation:  After the interventions noted above, I reevaluated the patient and found that they have :stayed the same   Social Determinants of Health:  SNF resident   Dispostion:  After consideration of the diagnostic results and the patients response to treatment, I feel that the patent would benefit from admission for trending of creatinine. IV abx initated pending urine culture. Hospitalist to admit.    {Document critical care time when appropriate:1} {Document review of labs and clinical decision tools ie heart score, Chads2Vasc2 etc:1}  {Document your independent review of radiology images, and any outside records:1} {Document your discussion with family members, caretakers, and with consultants:1} {Document social determinants of health affecting pt's care:1} {Document your decision making why or why not admission, treatments were needed:1} Final Clinical Impression(s) / ED Diagnoses Final  diagnoses:  Urinary retention  Gross hematuria  AKI (acute kidney injury) ([place])    Rx / DC Orders ED Discharge Orders     None

## 2022-03-23 NOTE — ED Provider Triage Note (Signed)
Emergency Medicine Provider Triage Evaluation Note  Level 5 caveat: Dementia  Matthew Knight , a 81 y.o. male  was evaluated in triage.  Pt complains of urinary retention. Daughter provides history as patient has a history of dementia. Daughter notes that patient has had blood in his urine. He is treated for enlarged prostate with medications. His urologist is [facility]. He was just diagnosed with pneumonia and had a catheter placed. His catheter was removed last week. Pt denies back pain, abdominal pain, nausea, vomiting.  Review of Systems  Positive:  Negative:   Physical Exam  BP (!) 157/73 (BP Location: Left Arm)   Pulse 100   Temp 98.2 F (36.8 C) (Oral)   Resp 20   SpO2 99%  Gen:   Awake, no distress   Resp:  Normal effort  MSK:   Moves extremities without difficulty  Other:  Generalized abdominal TTP, more so to suprapubic region. TTP noted to right CVA.   Medical Decision Making  Medically screening exam initiated at 3:41 PM.  Appropriate orders placed.  Matthew Knight was informed that the remainder of the evaluation will be completed by another provider, this initial triage assessment does not replace that evaluation, and the importance of remaining in the ED until their evaluation is complete.  3:43 PM bladder scan noted 650 ml in bladder. Foley placed in triage.    Dunya Meiners A, PA-C 03/23/22 1544

## 2022-03-23 NOTE — H&P (Signed)
History and Physical    Patient: Matthew Knight:[id number] DOB: [birth year] DOA: 03/23/2022 DOS: the patient was seen and examined on 03/23/2022 PCP: [name], MD  Patient coming from: SNF  Chief Complaint:  Chief Complaint  Patient presents with   Urinary Retention   HPI: Matthew Knight is a 82 y.o. male with medical history significant of prior CVA, BPH, essential hypertension, hyperlipidemia, mild cognitive deficit, diabetes, early dementia, who is a resident of [facility] facility presented with obstructive uropathy.  Patient was recently discharged from the hospital last week.  He had Foley in place that was removed about a week ago.  He started having increased urinary frequency and abdominal distention 2 days ago.  Also lower abdominal pain.  Came to the ER with significant lower abdominal tenderness.  Poor urine output.  Foley catheter was inserted on patient able to urinate now.  His creatinine was noted to have jumped from 1.2 on the fourth to 6.29.  BUN went from 21-53.  Also leukocytosis with a white count of about 24,000.  Urinalysis consistent with some infection.  Patient at this point is suspected to have had AKI secondary to obstructive uropathy.  Also UTI.  He is being readmitted to the hospital for further evaluation and treatment.  Patient does not meet sepsis criteria at this point.  Review of Systems: As mentioned in the history of present illness. All other systems reviewed and are negative. Past Medical History:  Diagnosis Date   Dementia ([place])    Diabetes mellitus without complication ([place])    Hearing loss    Hepatitis    Small vessel disease ([place])    Stroke [facility])    Past Surgical History:  Procedure Laterality Date   APPENDECTOMY     BIOPSY  07/08/2021   Procedure: BIOPSY;  Surgeon: Wilford Corner, MD;  Location: [place];  Service: Gastroenterology;;   COLONOSCOPY WITH PROPOFOL N/A 03/24/2021   Procedure: COLONOSCOPY WITH PROPOFOL;   Surgeon: Mauri Pole, MD;  Location: [place] [facility];  Service: Endoscopy;  Laterality: N/A;   COLONOSCOPY WITH PROPOFOL N/A 07/08/2021   Procedure: COLONOSCOPY WITH PROPOFOL;  Surgeon: Wilford Corner, MD;  Location: [place];  Service: Gastroenterology;  Laterality: N/A;   ESOPHAGOGASTRODUODENOSCOPY (EGD) WITH PROPOFOL N/A 03/24/2021   Procedure: ESOPHAGOGASTRODUODENOSCOPY (EGD) WITH PROPOFOL;  Surgeon: Mauri Pole, MD;  Location: [place] [facility];  Service: Endoscopy;  Laterality: N/A;   GIVENS CAPSULE STUDY N/A 07/05/2021   Procedure: GIVENS CAPSULE STUDY;  Surgeon: Wilford Corner, MD;  Location: [place];  Service: Gastroenterology;  Laterality: N/A;   HEMOSTASIS CLIP PLACEMENT  03/24/2021   Procedure: HEMOSTASIS CLIP PLACEMENT;  Surgeon: Mauri Pole, MD;  Location: [place];  Service: Endoscopy;;   HOT HEMOSTASIS N/A 03/24/2021   Procedure: HOT HEMOSTASIS (ARGON PLASMA COAGULATION/BICAP);  Surgeon: Mauri Pole, MD;  Location: [facility] ENDOSCOPY;  Service: Endoscopy;  Laterality: N/A;   HOT HEMOSTASIS N/A 07/08/2021   Procedure: HOT HEMOSTASIS (ARGON PLASMA COAGULATION/BICAP);  Surgeon: Wilford Corner, MD;  Location: [place];  Service: Gastroenterology;  Laterality: N/A;   POLYPECTOMY  03/24/2021   Procedure: POLYPECTOMY;  Surgeon: Mauri Pole, MD;  Location: [place] [facility];  Service: Endoscopy;;   POLYPECTOMY  07/08/2021   Procedure: POLYPECTOMY;  Surgeon: Wilford Corner, MD;  Location: [facility] ENDOSCOPY;  Service: Gastroenterology;;   Social History:  reports that he has been smoking pipe. He has never used smokeless tobacco. He reports that he does not drink alcohol and does not use drugs.  Allergies  Allergen Reactions   Aspirin Other (See Comments)    "BLEEDS OUT INTERNALLY"   Nsaids Other (See Comments)    NO ANTI-COAGULANTS, EITHER!!!! "BLEEDS OUT INTERNALLY" (ONLY Tylenol is tolerated)   Darunavir Other (See Comments)    Eruption of the  skin   Rivastigmine Other (See Comments)    Brand name only for the patch- "otherwise, he will have a violent reaction"   Sulfa Antibiotics Other (See Comments)    States this "made him crazy"    Family History  Problem Relation Age of Onset   Diabetes Mother    Heart disease Father    Memory loss Paternal Grandfather     Prior to Admission medications   Medication Sig Start Date End Date Taking? Authorizing Provider  acetaminophen (TYLENOL) 325 MG tablet Take 2 tablets (650 mg total) by mouth every 4 (four) hours as needed for mild pain, moderate pain, fever or headache. 03/11/21   [name], MD  Cholecalciferol (VITAMIN D3) 1000 units CAPS Take 1,000 Units by mouth daily.    [provider]  Docusate Sodium (DSS) 100 MG CAPS Take 100 mg by mouth 2 (two) times daily. Patient not taking: Reported on 03/10/2022 07/10/21   Raiford Noble Latif, DO  escitalopram (LEXAPRO) 20 MG tablet Take 20 mg by mouth daily. 07/11/17   [provider]  isosorbide mononitrate (IMDUR) 30 MG 24 hr tablet Take 30 mg by mouth daily. 04/28/20   [provider]  latanoprost (XALATAN) 0.005 % ophthalmic solution Place 1 drop into both eyes at bedtime.  07/27/17   [provider]  lisinopril (ZESTRIL) 10 MG tablet Take 10 mg by mouth daily. 02/01/20   [provider]  Multiple Vitamin (MULTIVITAMIN WITH MINERALS) TABS tablet Take 1 tablet by mouth daily. Patient not taking: Reported on 03/10/2022 07/11/21   Raiford Noble Latif, DO  pantoprazole (PROTONIX) 40 MG tablet Take 1 tablet (40 mg total) by mouth daily. Patient taking differently: Take 40 mg by mouth daily before breakfast. 07/11/21   Raiford Noble Latif, DO  polyethylene glycol (MIRALAX) 17 g packet Take 17 g by mouth daily as needed for moderate constipation. Patient not taking: Reported on 03/10/2022 07/04/21   [name], MD  pravastatin (PRAVACHOL) 20 MG tablet Take 20 mg by mouth daily. 04/28/20   [provider]  Prenatal Vit-Fe Fumarate-FA (PNV FOLIC ACID + IRON PO) Take 1 tablet by mouth daily with breakfast.    [provider]  RHOPRESSA 0.02 % SOLN Place 1 drop into both eyes daily. 11/15/18   [provider]  silodosin (RAPAFLO) 8 MG CAPS capsule Take 8 mg by mouth daily. 06/13/21   [provider]    Physical Exam: Vitals:   03/23/22 1538 03/23/22 1546 03/23/22 2235  BP: (!) 157/73  130/68  Pulse: 100  87  Resp: 20  18  Temp: 98.2 F (36.8 C)  (!) 97.4 F (36.3 C)  TempSrc: Oral  Oral  SpO2: 99%  99%  Weight:  107 kg    Constitutional: Chronically ill looking, frail, confused NAD, calm, comfortable Eyes: PERRL, lids and conjunctivae normal ENMT: Mucous membranes are moist. Posterior pharynx clear of any exudate or lesions.Normal dentition.  Neck: normal, supple, no masses, no thyromegaly Respiratory: clear to auscultation bilaterally, no wheezing, no crackles. Normal respiratory effort. No accessory muscle use.  Cardiovascular: Regular rate and rhythm, no murmurs / rubs / gallops. No extremity edema. 2+ pedal pulses. No carotid bruits.  Abdomen: no tenderness, no masses palpated. No hepatosplenomegaly. Bowel sounds positive.  Musculoskeletal: Good range of motion, no joint swelling or tenderness, Skin: no rashes, lesions, ulcers. No induration Neurologic: CN 2-12 grossly intact. Sensation intact, DTR normal. Strength 5/5 in all 4.  Psychiatric: Confused, awake and alert,  Data Reviewed:   Sodium 132, potassium 3.4, chloride 101, CO2 21, glucose 130, BUN 53 creatinine 6.29 and calcium 8.1.  White count is 23.5.  Urinalysis showed cloudy urine with large blood.  RBC more than 50.  WBC 11-20 with [place] urine.  Renal ultrasound currently pending.  Assessment and Plan:  #1 obstructive uropathy: Renal ultrasound pending.  Foley catheter inserted.  Most likely worsening BPH.  He recently had Foley that was removed about a week ago.  Will be on  Flomax.  Urology consult if ultrasound showed significant obstruction.  #2 UTI: Empiric Rocephin.  Follow urine cultures and blood cultures.  #3 hypokalemia: Replete potassium  #4 hyponatremia: Most likely due to dehydration.  Continue to monitor  #5 leukocytosis: Most likely due to UTI.  Continue antibiotics.  #6 history of BPH: Suspected cause of obstructive uropathy.  #7 dementia: Appears to be at baseline.  #8 essential hypertension: Continue home regimen.  #9 hyperlipidemia: Confirm on resume home regimen    Advance Care Planning:   Code Status: Prior DNR  Consults: None but may need urology consult  Family Communication: No family at bedside  Severity of Illness: The appropriate patient status for this patient is INPATIENT. Inpatient status is judged to be reasonable and necessary in order to provide the required intensity of service to ensure the patient's safety. The patient's presenting symptoms, physical exam findings, and initial radiographic and laboratory data in the context of their chronic comorbidities is felt to place them at high risk for further clinical deterioration. Furthermore, it is not anticipated that the patient will be medically stable for discharge from the hospital within 2 midnights of admission.   * I certify that at the point of admission it is my clinical judgment that the patient will require inpatient hospital care spanning beyond 2 midnights from the point of admission due to high intensity of service, high risk for further deterioration and high frequency of surveillance required.*  AuthorBarbette Merino, MD 03/23/2022 11:24 PM  For on call review [email or web address].

## 2022-03-23 NOTE — ED Triage Notes (Signed)
Family reports foley removed 1 week ago.  Sunday noticed increased urinary frequency and abd distention.  Pt resides at [facility].  Abd pain tender to palpitation

## 2022-03-23 NOTE — ED Provider Notes (Signed)
[facility] DEPT Provider Note   CSN: [id number] Arrival date & time: 03/23/22  1451     History  Chief Complaint  Patient presents with   Urinary Retention    LEVEL 5 CAVEAT 2/2 DEMENTIA  Matthew Knight is a 82 y.o. male.  82 y/o male presents to the emergency department from his skilled nursing facility for urinary retention.  He had a Foley catheter placed during a recent admission for acute urinary retention felt secondary to BPH.  This was removed by [facility] on 03/19/2022 following initiation of Rapaflo.  Per family, patient did complete a 3 day course of abx following foley catheter removal. He began to experience difficulty urinating again approximately 2 days ago.  Patient denies abdominal pain, back pain. No known fevers at the facility PTA.  Bladder scan with 669m when assessed in triage.  The history is provided by the patient and a relative. No language interpreter was used.       Home Medications Prior to Admission medications   Medication Sig Start Date End Date Taking? Authorizing Provider  acetaminophen (TYLENOL) 325 MG tablet Take 2 tablets (650 mg total) by mouth every 4 (four) hours as needed for mild pain, moderate pain, fever or headache. 03/11/21   GPatrecia Pour MD  Cholecalciferol (VITAMIN D3) 1000 units CAPS Take 1,000 Units by mouth daily.    [provider]  Docusate Sodium (DSS) 100 MG CAPS Take 100 mg by mouth 2 (two) times daily. Patient not taking: Reported on 03/10/2022 07/10/21   SRaiford NobleLatif, DO  escitalopram (LEXAPRO) 20 MG tablet Take 20 mg by mouth daily. 07/11/17   [provider]  isosorbide mononitrate (IMDUR) 30 MG 24 hr tablet Take 30 mg by mouth daily. 04/28/20   [provider]  latanoprost (XALATAN) 0.005 % ophthalmic solution Place 1 drop into both eyes at bedtime.  07/27/17   [provider]  lisinopril (ZESTRIL) 10 MG tablet Take 10 mg by mouth daily.  02/01/20   [provider]  Multiple Vitamin (MULTIVITAMIN WITH MINERALS) TABS tablet Take 1 tablet by mouth daily. Patient not taking: Reported on 03/10/2022 07/11/21   SRaiford NobleLatif, DO  pantoprazole (PROTONIX) 40 MG tablet Take 1 tablet (40 mg total) by mouth daily. Patient taking differently: Take 40 mg by mouth daily before breakfast. 07/11/21   SRaiford NobleLatif, DO  polyethylene glycol (MIRALAX) 17 g packet Take 17 g by mouth daily as needed for moderate constipation. Patient not taking: Reported on 03/10/2022 07/04/21   LOswald Hillock MD  pravastatin (PRAVACHOL) 20 MG tablet Take 20 mg by mouth daily. 04/28/20   [provider]  Prenatal Vit-Fe Fumarate-FA (PNV FOLIC ACID + IRON PO) Take 1 tablet by mouth daily with breakfast.    [provider]  RHOPRESSA 0.02 % SOLN Place 1 drop into both eyes daily. 11/15/18   [provider]  silodosin (RAPAFLO) 8 MG CAPS capsule Take 8 mg by mouth daily. 06/13/21   [provider]      Allergies    Aspirin, Nsaids, Darunavir, Rivastigmine, and Sulfa antibiotics    Review of Systems   Review of Systems  Unable to perform ROS: Dementia    Physical Exam Updated Vital Signs BP 130/68   Pulse 87   Temp (!) 97.4 F (36.3 C) (Oral)   Resp 18   Wt 107 kg   SpO2 99%   BMI 31.99 kg/m   Physical Exam  Vitals and nursing note reviewed.  Constitutional:      General: He is not in acute distress.    Appearance: He is well-developed. He is not diaphoretic.     Comments: Nontoxic appearing and in NAD. Requesting a quarter pounder with fries.  HENT:     Head: Normocephalic and atraumatic.  Eyes:     General: No scleral icterus.    Conjunctiva/sclera: Conjunctivae normal.  Cardiovascular:     Rate and Rhythm: Normal rate and regular rhythm.     Pulses: Normal pulses.  Pulmonary:     Effort: Pulmonary effort is normal. No respiratory distress.     Comments: Respirations even and unlabored Abdominal:      Comments: Soft, nontender abdomen.  Musculoskeletal:        General: Normal range of motion.     Cervical back: Normal range of motion.  Skin:    General: Skin is warm and dry.     Coloration: Skin is not pale.     Findings: No erythema or rash.  Neurological:     Mental Status: He is alert and oriented to person, place, and time.     Coordination: Coordination normal.  Psychiatric:        Behavior: Behavior normal.     ED Results / Procedures / Treatments   Labs (all labs ordered are listed, but only abnormal results are displayed) Labs Reviewed  URINALYSIS, ROUTINE W REFLEX MICROSCOPIC - Abnormal; Notable for the following components:      Result Value   APPearance CLOUDY (*)    Hgb urine dipstick LARGE (*)    Protein, ur 30 (*)    RBC / HPF >50 (*)    Bacteria, UA RARE (*)    All other components within normal limits  COMPREHENSIVE METABOLIC PANEL - Abnormal; Notable for the following components:   Sodium 132 (*)    Potassium 3.4 (*)    CO2 21 (*)    Glucose, Bld 130 (*)    BUN 53 (*)    Creatinine, Ser 6.29 (*)    Calcium 8.1 (*)    GFR, Estimated 8 (*)    All other components within normal limits  CBC WITH DIFFERENTIAL/PLATELET - Abnormal; Notable for the following components:   WBC 23.5 (*)    Neutro Abs 19.8 (*)    Monocytes Absolute 2.1 (*)    Abs Immature Granulocytes 0.13 (*)    All other components within normal limits  CBG MONITORING, ED - Abnormal; Notable for the following components:   Glucose-Capillary 123 (*)    All other components within normal limits  URINE CULTURE  CBC  CREATININE, SERUM  CBC  COMPREHENSIVE METABOLIC PANEL    EKG None  Radiology US Renal  Result Date: 03/23/2022 CLINICAL DATA:  Urinary retention, elevated creatinine EXAM: RENAL / URINARY TRACT ULTRASOUND COMPLETE COMPARISON:  03/10/2022 FINDINGS: Right Kidney: Renal measurements: 10.6 x 5.9 x 6.9 cm = volume: 215 mL. Mild pelviectasis. Normal echotexture. No mass.  Left Kidney: Renal measurements: 11.5 x 6.2 x 5.9 cm = volume: 217 mL. Echogenicity within normal limits. No mass or hydronephrosis visualized. Bladder: Decompressed with Foley catheter in place. Other: None. IMPRESSION: Mild right pelviectasis, similar to prior study. No acute findings. Electronically Signed   By: Rolm Baptise M.D.   On: 03/23/2022 23:39    Procedures Procedures    Medications Ordered in ED Medications  heparin injection 5,000 Units (has no administration in time range)  ondansetron (ZOFRAN) tablet 4  mg (has no administration in time range)    Or  ondansetron (ZOFRAN) injection 4 mg (has no administration in time range)  0.9 %  sodium chloride infusion (has no administration in time range)  cefTRIAXone (ROCEPHIN) 1 g in sodium chloride 0.9 % 100 mL IVPB (has no administration in time range)  latanoprost (XALATAN) 0.005 % ophthalmic solution 1 drop (has no administration in time range)  Netarsudil Dimesylate 0.02 % SOLN 1 drop (has no administration in time range)  isosorbide mononitrate (IMDUR) 24 hr tablet 30 mg (has no administration in time range)  pravastatin (PRAVACHOL) tablet 20 mg (has no administration in time range)  escitalopram (LEXAPRO) tablet 20 mg (has no administration in time range)  acetaminophen (TYLENOL) tablet 650 mg (has no administration in time range)  pantoprazole (PROTONIX) EC tablet 40 mg (has no administration in time range)  tamsulosin (FLOMAX) capsule 0.4 mg (has no administration in time range)  fentaNYL (SUBLIMAZE) injection 50 mcg (50 mcg Intramuscular Given 03/23/22 1621)    ED Course/ Medical Decision Making/ A&P                             Medical Decision Making Amount and/or Complexity of Data Reviewed Radiology: ordered.  Risk Decision regarding hospitalization.   This patient presents to the ED for concern of urinary retention, this involves an extensive number of treatment options, and is a complaint that carries with it a  high risk of complications and morbidity.  The differential diagnosis includes BPH vs UTI vs urethral stone    Co morbidities that complicate the patient evaluation  Dementia    Additional history obtained:  Additional history obtained from family, at bedside External records from outside source obtained and reviewed including renal US from 03/10/22.   Lab Tests:  I Ordered, and personally interpreted labs.  The pertinent results include:  Leukocytosis of 23.5 (up from 13.2 on 03/11/22), Creatinine of 6.29 (up from 1.2 on 03/11/22), UA with gross hematuria and 11-20 WBCs. Rare bacteriuria   Imaging Studies ordered:  I ordered imaging studies including US Renal  I independently visualized and interpreted imaging which showed mild right pelviectasis which is unchanged from prior. I agree with the radiologist interpretation   Cardiac Monitoring:  The patient was maintained on a cardiac monitor.  I personally viewed and interpreted the cardiac monitored which showed an underlying rhythm of: NSR   Medicines ordered and prescription drug management:  I ordered medication including Rocephin for coverage of UTI given hematuria, pyuria, worsening leukocytosis.  Reevaluation of the patient after these medicines showed that the patient stayed the same I have reviewed the patients home medicines and have made adjustments as needed   Test Considered:  CT abdomen/pelvis   Critical Interventions:  Insertion of foley catheter   Consultations Obtained:  I requested consultation with Dr. Jonelle Sidle of the hospitalist service and discussed lab and imaging findings as well as pertinent plan - they agree with plan for admission   Problem List / ED Course:  Patient presenting for worsening urinary retention.  Began to have difficulty voiding approximately 2 days ago.  Had a Foley catheter removed on Friday.  His bladder scan in triage resulted with 650 cc in the bladder. Gross hematuria noted  upon placement of Foley catheter.  No clots noted.  The patient is not chronically anticoagulated. Urinalysis noted to have pyuria and gross hematuria.  Given worsening leukocytosis, patient ordered to receive  IV Rocephin for coverage of urinary tract infection.  Urine culture has been ordered and is pending. Of note, the patient has been found to have a new acute kidney injury with rising creatinine from 1.2 up to 6.29.  His GFR has dropped from >60 down to 8 today.  Suspect these changes to be related to urinary obstruction and retention.  Renal ultrasound appears stable compared to 2 weeks ago. Patient will be admitted for ongoing trending of his creatinine value pending urine culture results.  Case discussed with Dr. Jonelle Sidle who will admit.   Reevaluation:  After the interventions noted above, I reevaluated the patient and found that they have :stayed the same   Social Determinants of Health:  SNF resident   Dispostion:  After consideration of the diagnostic results and the patients response to treatment, I feel that the patent would benefit from admission for trending of creatinine. IV abx initiated pending urine culture. Hospitalist to admit.          Final Clinical Impression(s) / ED Diagnoses Final diagnoses:  Urinary retention  Gross hematuria  AKI (acute kidney injury) [facility])    Rx / [facility] Orders ED Discharge Orders     None         Antonietta Breach, PA-C 03/24/22 0006    Veryl Speak, MD 03/24/22 [phone]

## 2022-03-24 DIAGNOSIS — N139 Obstructive and reflux uropathy, unspecified: Secondary | ICD-10-CM | POA: Diagnosis not present

## 2022-03-24 LAB — COMPREHENSIVE METABOLIC PANEL
ALT: 21 U/L (ref 0–44)
AST: 22 U/L (ref 15–41)
Albumin: 2.8 g/dL — ABNORMAL LOW (ref 3.5–5.0)
Alkaline Phosphatase: 48 U/L (ref 38–126)
Anion gap: 8 (ref 5–15)
BUN: 40 mg/dL — ABNORMAL HIGH (ref 8–23)
CO2: 22 mmol/L (ref 22–32)
Calcium: 7.8 mg/dL — ABNORMAL LOW (ref 8.9–10.3)
Chloride: 106 mmol/L (ref 98–111)
Creatinine, Ser: 2.39 mg/dL — ABNORMAL HIGH (ref 0.61–1.24)
GFR, Estimated: 27 mL/min — ABNORMAL LOW (ref >60)
Glucose, Bld: 102 mg/dL — ABNORMAL HIGH (ref 70–99)
Potassium: 3.3 mmol/L — ABNORMAL LOW (ref 3.5–5.1)
Sodium: 136 mmol/L (ref 135–145)
Total Bilirubin: 0.8 mg/dL (ref 0.3–1.2)
Total Protein: 5.7 g/dL — ABNORMAL LOW (ref 6.5–8.1)

## 2022-03-24 LAB — URINE CULTURE: Culture: NO GROWTH

## 2022-03-24 LAB — CBC
HCT: 37.7 % — ABNORMAL LOW (ref 39.0–52.0)
HCT: 34.9 % — ABNORMAL LOW (ref 39.0–52.0)
Hemoglobin: 12.4 g/dL — ABNORMAL LOW (ref 13.0–17.0)
Hemoglobin: 11.6 g/dL — ABNORMAL LOW (ref 13.0–17.0)
MCH: 28.5 pg (ref 26.0–34.0)
MCH: 29.1 pg (ref 26.0–34.0)
MCHC: 32.9 g/dL (ref 30.0–36.0)
MCHC: 33.2 g/dL (ref 30.0–36.0)
MCV: 86.7 fL (ref 80.0–100.0)
MCV: 87.5 fL (ref 80.0–100.0)
Platelets: 326 10*3/uL (ref 150–400)
Platelets: 285 10*3/uL (ref 150–400)
RBC: 4.35 MIL/uL (ref 4.22–5.81)
RBC: 3.99 MIL/uL — ABNORMAL LOW (ref 4.22–5.81)
RDW: 12.9 % (ref 11.5–15.5)
RDW: 12.9 % (ref 11.5–15.5)
WBC: 17.3 10*3/uL — ABNORMAL HIGH (ref 4.0–10.5)
WBC: 13.3 10*3/uL — ABNORMAL HIGH (ref 4.0–10.5)
nRBC: 0 % (ref 0.0–0.2)
nRBC: 0 % (ref 0.0–0.2)

## 2022-03-24 LAB — CREATININE, SERUM
Creatinine, Ser: 3.38 mg/dL — ABNORMAL HIGH (ref 0.61–1.24)
GFR, Estimated: 18 mL/min — ABNORMAL LOW (ref >60)

## 2022-03-24 NOTE — Hospital Course (Addendum)
82 y.o. male with medical history significant of prior CVA, BPH, essential hypertension, hyperlipidemia, mild cognitive deficit, diabetes, early dementia, who is a resident of [facility] facility presented with obstructive uropathy.  Patient was recently discharged from the hospital last week.  He had Foley in place that was removed about a week ago.  He started having increased urinary frequency and abdominal distention 2 days ago.  Also lower abdominal pain.  Came to the ER with significant lower abdominal tenderness.  Poor urine output.  Foley catheter was inserted on patient able to urinate now.  His creatinine was noted to have jumped from 1.2 on the fourth to 6.29.  BUN went from 21-53.  Also leukocytosis with a white count of about 24,000.  Urinalysis consistent with some infection.  Patient at this point is suspected to have had AKI secondary to obstructive uropathy.  Also UTI.  He is being readmitted to the hospital for further evaluation and treatment.  Patient does not meet sepsis criteria at this point. After Foley catheter placement he has been voiding well AKI has resolved creatinine baseline at 1.3 now, initial leukocytosis has resolved.  His urine culture since admission has been negative.  He was treated with ceftriaxone will change to p.o. antibiotics for discharge. Overall medically stable for discharge today.

## 2022-03-24 NOTE — Progress Notes (Signed)
PROGRESS NOTE Matthew Knight  NWG:[id number] DOB: 06/06/1940 DOA: 03/23/2022 PCP: [name], MD   Brief Narrative/Hospital Course: 82 y.o. male with medical history significant of prior CVA, BPH, essential hypertension, hyperlipidemia, mild cognitive deficit, diabetes, early dementia, who is a resident of [facility] facility presented with obstructive uropathy.  Patient was recently discharged from the hospital last week.  He had Foley in place that was removed about a week ago.  He started having increased urinary frequency and abdominal distention 2 days ago.  Also lower abdominal pain.  Came to the ER with significant lower abdominal tenderness.  Poor urine output.  Foley catheter was inserted on patient able to urinate now.  His creatinine was noted to have jumped from 1.2 on the fourth to 6.29.  BUN went from 21-53.  Also leukocytosis with a white count of about 24,000.  Urinalysis consistent with some infection.  Patient at this point is suspected to have had AKI secondary to obstructive uropathy.  Also UTI.  He is being readmitted to the hospital for further evaluation and treatment.  Patient does not meet sepsis criteria at this point.       Subjective: Seen and examined He is very hard of hearing- reports he is sleepy. Foley in place and bag is almost full Reports he has a kidney doctor but not urology Lives with his wife Alert awake oriented to self place month not president and is at baseline confusion Overnight afebrile.  BP stable Labs shows creatinine improving to 6.2> 3.3> 2.3 WBC  23>17.3> 13   Assessment and Plan:  Obstructed, uropathy/AKI BPH with urinary obstruction: Creat improving cont ivf, foley draining well.  Renal ultrasound reviewed shows mild right pelviectasis similar to prior study no acute finding.  Patient will need outpatient follow-up with urology.  Recent discharge on 1/5 he had a Foley catheter placed followed up with urology and he had his  Foley catheter removed a week PTA. He  will need outpatient urology follow-up will need to continue on Foley catheter for now. Continue Flomax Recent Labs    07/06/21 0059 07/07/21 0152 07/08/21 0347 07/09/21 0313 07/10/21 0259 08/31/21 1059 03/10/22 1525 03/11/22 0340 03/23/22 1545 03/24/22 0130 03/24/22 0635  BUN 10 7* 6* '8 8 15 23 21 '$ 53*  --  40*  CREATININE 1.24 1.16 1.13 1.24 1.27* 1.10 1.51* 1.20 6.29* 3.38* 2.39*    UTI: Continue current antibiotics follow-up urine culture  History of TIA: Continue pravastatin  Benign essential HTN: BP well-controlled this morning Mild cognitive impairment with memory loss/dementia confused at baseline is hard of hearing.  Continue supportive care fall precaution HLD: Continue pravastatin Hypokalemia mild monitor and replete: Class I Obesity:Patient's Body mass index is 31.99 kg/m. : Will benefit with PCP follow-up, weight loss  healthy lifestyle and outpatient sleep evaluation.   DVT prophylaxis: Place and maintain sequential compression device Start: 03/24/22 0517 Code Status:   Code Status: DNR Family Communication: plan of care discussed with patient/RN at bedside. Patient status is: Inpatient because of acute renal failure Level of care: Telemetry   Dispo: The patient is from: home             Anticipated disposition: home  Objective: Vitals last 24 hrs: Vitals:   03/23/22 2235 03/24/22 0200 03/24/22 0215 03/24/22 0622  BP: 130/68  126/65 (!) 148/55  Pulse: 87  73 77  Resp: '18  18 16  '$ Temp: (!) 97.4 F (36.3 C) 97.6 F (36.4 C)  98.1 F (36.7  C)  TempSrc: Oral   Oral  SpO2: 99%  96% 97%  Weight:       Weight change:   Physical Examination: General exam: alert awake, very hard of hearing, older than stated age HEENT:Oral mucosa moist, Ear/Nose WNL grossly Respiratory system: bilaterally clear  BS, no use of accessory muscle Cardiovascular system: S1 & S2 +, No JVD. Gastrointestinal system: Abdomen soft,NT,ND,  BS+ Nervous System:Alert, awake, moving extremities. Extremities: LE edema neg,distal peripheral pulses palpable.  Skin: No rashes,no icterus. MSK: Normal muscle bulk,tone, power Foley catheter in place Medications reviewed:  Scheduled Meds:  escitalopram  20 mg Oral Daily   isosorbide mononitrate  30 mg Oral Daily   latanoprost  1 drop Both Eyes QHS   Netarsudil Dimesylate  1 drop Both Eyes Daily   pantoprazole  40 mg Oral QAC breakfast   pravastatin  20 mg Oral Daily   tamsulosin  0.4 mg Oral Daily   Continuous Infusions:  sodium chloride 100 mL/hr at 03/24/22 0116   cefTRIAXone (ROCEPHIN)  IV 1 g (03/24/22 0119)      Diet Order             Diet Heart Room service appropriate? Yes; Fluid consistency: Thin  Diet effective now                  Intake/Output Summary (Last 24 hours) at 03/24/2022 0910 Last data filed at 03/23/2022 1638 Gross per 24 hour  Intake --  Output 1000 ml  Net -1000 ml   Net IO Since Admission: -1,000 mL [03/24/22 0910]  Wt Readings from Last 3 Encounters:  03/23/22 107 kg  03/11/22 107 kg  07/08/21 95.4 kg     Unresulted Labs (From admission, onward)     Start     Ordered   03/23/22 1544  Urine Culture  Once,   URGENT       Question:  Indication  Answer:  Dysuria   03/23/22 1544          Data Reviewed: I have personally reviewed following labs and imaging studies CBC: Recent Labs  Lab 03/23/22 1545 03/24/22 0130 03/24/22 0635  WBC 23.5* 17.3* 13.3*  NEUTROABS 19.8*  --   --   HGB 13.8 12.4* 11.6*  HCT 41.0 37.7* 34.9*  MCV 87.6 86.7 87.5  PLT 390 326 865   Basic Metabolic Panel: Recent Labs  Lab 03/23/22 1545 03/24/22 0130 03/24/22 0635  NA 132*  --  136  K 3.4*  --  3.3*  CL 101  --  106  CO2 21*  --  22  GLUCOSE 130*  --  102*  BUN 53*  --  40*  CREATININE 6.29* 3.38* 2.39*  CALCIUM 8.1*  --  7.8*   GFR: Estimated Creatinine Clearance: 30.7 mL/min (A) (by C-G formula based on SCr of 2.39 mg/dL (H)). Liver  Function Tests: Recent Labs  Lab 03/23/22 1545 03/24/22 0635  AST 25 22  ALT 24 21  ALKPHOS 64 48  BILITOT 0.9 0.8  PROT 7.1 5.7*  ALBUMIN 3.6 2.8*  CBG: Recent Labs  Lab 03/23/22 1636  GLUCAP 123*  Antimicrobials: Anti-infectives (From admission, onward)    Start     Dose/Rate Route Frequency Ordered Stop   03/23/22 2330  cefTRIAXone (ROCEPHIN) 1 g in sodium chloride 0.9 % 100 mL IVPB        1 g 200 mL/hr over 30 Minutes Intravenous Every 24 hours 03/23/22 2326     03/23/22 2300  cefTRIAXone (ROCEPHIN) 1 g in sodium chloride 0.9 % 100 mL IVPB  Status:  Discontinued        1 g 200 mL/hr over 30 Minutes Intravenous  Once 03/23/22 2258 03/23/22 2343      Culture/Microbiology    Component Value Date/Time   SDES  03/10/2022 1557    URINE, CLEAN CATCH Performed at [facility], [place] [address], [place], [place] [address]    SPECREQUEST  03/10/2022 1557    NONE Performed at [facility], [place] [address] [place], [place] [address]    CULT  03/10/2022 1557    NO GROWTH Performed at [facility] [address] [place], [place] [address]    REPTSTATUS 03/11/2022 FINAL 03/10/2022 1557  Radiology Studies: US Renal  Result Date: 03/23/2022 CLINICAL DATA:  Urinary retention, elevated creatinine EXAM: RENAL / URINARY TRACT ULTRASOUND COMPLETE COMPARISON:  03/10/2022 FINDINGS: Right Kidney: Renal measurements: 10.6 x 5.9 x 6.9 cm = volume: 215 mL. Mild pelviectasis. Normal echotexture. No mass. Left Kidney: Renal measurements: 11.5 x 6.2 x 5.9 cm = volume: 217 mL. Echogenicity within normal limits. No mass or hydronephrosis visualized. Bladder: Decompressed with Foley catheter in place. Other: None. IMPRESSION: Mild right pelviectasis, similar to prior study. No acute findings. Electronically Signed   By: Rolm Baptise M.D.   On: 03/23/2022 23:39     LOS: 1 day   Antonieta Pert, MD [facility]  03/24/2022, 9:10 AM

## 2022-03-25 DIAGNOSIS — N139 Obstructive and reflux uropathy, unspecified: Secondary | ICD-10-CM | POA: Diagnosis not present

## 2022-03-25 LAB — BASIC METABOLIC PANEL
Anion gap: 8 (ref 5–15)
BUN: 27 mg/dL — ABNORMAL HIGH (ref 8–23)
CO2: 20 mmol/L — ABNORMAL LOW (ref 22–32)
Calcium: 7.7 mg/dL — ABNORMAL LOW (ref 8.9–10.3)
Chloride: 110 mmol/L (ref 98–111)
Creatinine, Ser: 1.32 mg/dL — ABNORMAL HIGH (ref 0.61–1.24)
GFR, Estimated: 54 mL/min — ABNORMAL LOW (ref >60)
Glucose, Bld: 102 mg/dL — ABNORMAL HIGH (ref 70–99)
Potassium: 3.1 mmol/L — ABNORMAL LOW (ref 3.5–5.1)
Sodium: 138 mmol/L (ref 135–145)

## 2022-03-25 LAB — POTASSIUM: Potassium: 4.5 mmol/L (ref 3.5–5.1)

## 2022-03-25 MED ORDER — CEPHALEXIN 500 MG PO CAPS: 500.0000 mg | ORAL_CAPSULE | Freq: Four times a day (QID) | ORAL | 0 refills | Status: AC

## 2022-03-25 MED ORDER — POTASSIUM CHLORIDE 10 MEQ/100ML IV SOLN
10.0000 meq | Freq: Once | INTRAVENOUS | Status: AC
  Administered 2022-03-25: 10 meq via INTRAVENOUS
  Filled 2022-03-25: qty 100

## 2022-03-25 MED ORDER — CEPHALEXIN 500 MG PO CAPS: 500.0000 mg | ORAL_CAPSULE | Freq: Four times a day (QID) | ORAL | 0 refills | Status: DC

## 2022-03-25 MED ORDER — POTASSIUM CHLORIDE CRYS ER 20 MEQ PO TBCR
40.0000 meq | EXTENDED_RELEASE_TABLET | ORAL | Status: AC
  Administered 2022-03-25 (×2): 40 meq via ORAL
  Filled 2022-03-25 (×2): qty 2

## 2022-03-25 NOTE — Discharge Summary (Signed)
Physician Discharge Summary  Matthew Knight OVF:[id number] DOB: June 10, 1940 DOA: 03/23/2022  PCP: [name], MD  Admit date: 03/23/2022 Discharge date: 03/25/2022 Recommendations for Outpatient Follow-up:  Follow up with PCP in 1 weeks-call for appointment Please follow-up with  [facility] in 1 to 2 weeks continue catheter for now Please obtain BMP/CBC in one week  [facility]charge Dispo: [facility] Discharge Condition: Stable Code Status:   Code Status: DNR Diet recommendation:  Diet Order             Diet Heart Room service appropriate? Yes; Fluid consistency: Thin  Diet effective now                    Brief/Interim Summary: 82 y.o. male with medical history significant of prior CVA, BPH, essential hypertension, hyperlipidemia, mild cognitive deficit, diabetes, early dementia, who is a resident of [facility] facility presented with obstructive uropathy.  Patient was recently discharged from the hospital last week.  He had Foley in place that was removed about a week ago.  He started having increased urinary frequency and abdominal distention 2 days ago.  Also lower abdominal pain.  Came to the ER with significant lower abdominal tenderness.  Poor urine output.  Foley catheter was inserted on patient able to urinate now.  His creatinine was noted to have jumped from 1.2 on the fourth to 6.29.  BUN went from 21-53.  Also leukocytosis with a white count of about 24,000.  Urinalysis consistent with some infection.  Patient at this point is suspected to have had AKI secondary to obstructive uropathy.  Also UTI.  He is being readmitted to the hospital for further evaluation and treatment.  Patient does not meet sepsis criteria at this point. After Foley catheter placement he has been voiding well AKI has resolved creatinine baseline at 1.3 now, initial leukocytosis has resolved.  His urine culture since admission has been negative.  He was treated with ceftriaxone will  change to p.o. antibiotics for discharge. Overall medically stable for discharge today.      Discharge Diagnoses:  Principal Problem:   Obstructed, uropathy Active Problems:   History of TIA (transient ischemic attack)   BPH with urinary obstruction   AKI (acute kidney injury) ([name])   Benign essential HTN   Mild cognitive impairment with memory loss   Hypertension, benign   HLD (hyperlipidemia)   Hypokalemia   UTI (urinary tract infection)  Obstructed, uropathy/AKI BPH with urinary obstruction: Creat improved w/ ivf, foley catheter placement, creatinine down to 1.3.  Continue Foley catheter, need outpatient follow-up with urology. Recent discharge on 1/5 he had a Foley catheter placed followed up with urology and he had his Foley catheter removed a week PTA. He  will need outpatient urology follow-up will need to continue on Foley catheter for now. Continue Flomax/Rapaflo Recent Labs    07/07/21 0152 07/08/21 0347 07/09/21 0313 07/10/21 0259 08/31/21 1059 03/10/22 1525 03/11/22 0340 03/23/22 1545 03/24/22 0130 03/24/22 0635 03/25/22 0407  BUN 7* 6* '8 8 15 23 21 '$ 53*  --  40* 27*  CREATININE 1.16 1.13 1.24 1.27* 1.10 1.51* 1.20 6.29* 3.38* 2.39* 1.32*    UTI: Urine culture negative, continue oral antibiotics to complete the course  at ALF. History of TIA: Continue pravastatin Benign essential HTN: BP well-controlled  Mild cognitive impairment with memory loss/dementia confused at baseline is hard of hearing.  Continue supportive care fall precaution HLD: Continue pravastatin Hypokalemia repleted> recheck k to make sure it  is better before discharge Recent Labs  Lab 03/23/22 1545 03/24/22 0635 03/25/22 0407 03/25/22 1320  K 3.4* 3.3* 3.1* 4.5    Class I Obesity:Patient's Body mass index is 31.99 kg/m. : Will benefit with PCP follow-up, weight loss  healthy lifestyle and outpatient sleep evaluation.   Consults: None  Subjective: He is alert awake he is hard of  hearing mental status baseline, creatinine has improved voiding well on Foley catheter. Plan of care discussed with patient's daughter and agreeable with plan for discharge to facility today  Discharge Exam: Vitals:   03/25/22 1130 03/25/22 1236  BP: (!) 151/61   Pulse: 66   Resp: 16   Temp:  97.9 F (36.6 C)  SpO2: 97%    General: Pt is alert, awake, not in acute distress Cardiovascular: RRR, S1/S2 +, no rubs, no gallops Respiratory: CTA bilaterally, no wheezing, no rhonchi Abdominal: Soft, NT, ND, bowel sounds + Extremities: no edema, no cyanosis  Discharge Instructions  Discharge Instructions     Discharge instructions   Complete by: As directed    Check bmp in 1 wk You will need to follow-up with urology for voiding trial due to ongoing urine retention> continue the Foley catheter until then  Please call call MD or return to ER for similar or worsening recurring problem that brought you to hospital or if any fever,nausea/vomiting,abdominal pain, uncontrolled pain, chest pain,  shortness of breath or any other alarming symptoms.  Please follow-up your doctor as instructed in a week time and call the office for appointment.  Please avoid alcohol, smoking, or any other illicit substance and maintain healthy habits including taking your regular medications as prescribed.  You were cared for by a hospitalist during your hospital stay. If you have any questions about your discharge medications or the care you received while you were in the hospital after you are discharged, you can call the unit and ask to speak with the hospitalist on call if the hospitalist that took care of you is not available.  Once you are discharged, your primary care physician will handle any further medical issues. Please note that NO REFILLS for any discharge medications will be authorized once you are discharged, as it is imperative that you return to your primary care physician (or establish a  relationship with a primary care physician if you do not have one) for your aftercare needs so that they can reassess your need for medications and monitor your lab values   Increase activity slowly   Complete by: As directed       Allergies as of 03/25/2022       Reactions   Aspirin Other (See Comments)   "BLEEDS OUT INTERNALLY"   Nsaids Other (See Comments)   NO ANTI-COAGULANTS, EITHER!!!! "BLEEDS OUT INTERNALLY" (ONLY Tylenol is tolerated)   Darunavir Other (See Comments)   Eruption of the skin   Rivastigmine Other (See Comments)   Brand name only for the patch- "otherwise, he will have a violent reaction"   Heparin Other (See Comments)   Pt requests NO ANTICOAGULANTS.  Has varices with hx of bleeding.   Sulfa Antibiotics Other (See Comments)   States this "made him crazy"        Medication List     TAKE these medications    acetaminophen 325 MG tablet Commonly known as: TYLENOL Take 2 tablets (650 mg total) by mouth every 4 (four) hours as needed for mild pain, moderate pain, fever or headache.   ASPIRIN  81 PO Take 81 mg by mouth daily.   cephALEXin 500 MG capsule Commonly known as: KEFLEX Take 1 capsule (500 mg total) by mouth 4 (four) times daily for 3 days.   CertaVite/Antioxidants Tabs Take 1 tablet by mouth daily.   escitalopram 20 MG tablet Commonly known as: LEXAPRO Take 20 mg by mouth daily.   isosorbide mononitrate 30 MG 24 hr tablet Commonly known as: IMDUR Take 30 mg by mouth daily.   latanoprost 0.005 % ophthalmic solution Commonly known as: XALATAN Place 1 drop into both eyes at bedtime.   lisinopril 10 MG tablet Commonly known as: ZESTRIL Take 10 mg by mouth daily.   pantoprazole 40 MG tablet Commonly known as: PROTONIX Take 1 tablet (40 mg total) by mouth daily. What changed: when to take this   PNV FOLIC ACID + IRON PO Take 1 tablet by mouth daily with breakfast.   polyethylene glycol 17 g packet Commonly known as: MiraLax Take  17 g by mouth daily as needed for moderate constipation.   pravastatin 20 MG tablet Commonly known as: PRAVACHOL Take 20 mg by mouth daily.   Rhopressa 0.02 % Soln Generic drug: Netarsudil Dimesylate Place 1 drop into both eyes daily.   silodosin 8 MG Caps capsule Commonly known as: RAPAFLO Take 8 mg by mouth daily.   Vitamin D3 1000 units Caps Take 1,000 Units by mouth daily.        Follow-up Information     [name], MD Follow up in 1 week(s).   Specialty: Family Medicine Contact information: [address] [place] [address] [phone]         [facility] Follow up in 1 week(s).   Contact information: [place] [address] [phone]               Allergies  Allergen Reactions   Aspirin Other (See Comments)    "BLEEDS OUT INTERNALLY"   Nsaids Other (See Comments)    NO ANTI-COAGULANTS, EITHER!!!! "BLEEDS OUT INTERNALLY" (ONLY Tylenol is tolerated)   Darunavir Other (See Comments)    Eruption of the skin   Rivastigmine Other (See Comments)    Brand name only for the patch- "otherwise, he will have a violent reaction"   Heparin Other (See Comments)    Pt requests NO ANTICOAGULANTS.  Has varices with hx of bleeding.   Sulfa Antibiotics Other (See Comments)    States this "made him crazy"    The results of significant diagnostics from this hospitalization (including imaging, microbiology, ancillary and laboratory) are listed below for reference.    Microbiology: Recent Results (from the past 240 hour(s))  Urine Culture     Status: None   Collection Time: 03/23/22  4:30 PM   Specimen: Urine, Clean Catch  Result Value Ref Range Status   Specimen Description   Final    URINE, CLEAN CATCH Performed at [facility], [place] [address], [place], [place] [address]    Special Requests   Final    NONE Performed at [facility], [place] [address], [place], [place] [address]    Culture   Final    NO GROWTH Performed at [facility], [place] [address], [place], [place] [address]    Report Status 03/24/2022 FINAL  Final    Procedures/Studies: US Renal  Result Date: 03/23/2022 CLINICAL DATA:  Urinary retention, elevated creatinine EXAM: RENAL / URINARY TRACT ULTRASOUND  COMPLETE COMPARISON:  03/10/2022 FINDINGS: Right Kidney: Renal measurements: 10.6 x 5.9 x 6.9 cm = volume: 215 mL. Mild pelviectasis. Normal echotexture. No mass. Left Kidney: Renal measurements: 11.5 x 6.2 x 5.9 cm = volume: 217 mL. Echogenicity within normal limits. No mass or hydronephrosis visualized. Bladder: Decompressed with Foley catheter in place. Other: None. IMPRESSION: Mild right pelviectasis, similar to prior study. No acute findings. Electronically Signed   By: Rolm Baptise M.D.   On: 03/23/2022 23:39   US RENAL  Result Date: 03/10/2022 CLINICAL DATA:  Acute renal injury EXAM: RENAL / URINARY TRACT ULTRASOUND COMPLETE COMPARISON:  CT from earlier in the same day. FINDINGS: Right Kidney: Renal measurements: 10.3 x 5.7 x 5.6 cm. = volume: 175 mL. Mild fullness of the collecting system is noted. Left Kidney: Renal measurements: 12.1 x 5.6 x 5.5 cm. = volume: 194 mL. Echogenicity within normal limits. No mass or hydronephrosis visualized. Bladder: Decompressed by Foley catheter. Other: None. IMPRESSION: Interval decompression of the bladder by Foley catheter. The previously seen fullness of the collecting systems has improved with only minimal fullness remaining on the right. These changes were likely accentuated by the distended bladder. No other focal abnormality is noted. Electronically Signed   By: Inez Catalina M.D.   On: 03/10/2022 20:48   CT ABDOMEN PELVIS W CONTRAST  Result Date: 03/10/2022 CLINICAL DATA:  Abdominal pain and potential urinary retention. EXAM: CT ABDOMEN AND PELVIS WITH CONTRAST TECHNIQUE: Multidetector CT imaging of the abdomen and pelvis was  performed using the standard protocol following bolus administration of intravenous contrast. RADIATION DOSE REDUCTION: This exam was performed according to the departmental dose-optimization program which includes automated exposure control, adjustment of the mA and/or kV according to patient size and/or use of iterative reconstruction technique. CONTRAST:  143m OMNIPAQUE IOHEXOL 300 MG/ML  SOLN COMPARISON:  CT of the chest, abdomen and pelvis of August 31, 2021 FINDINGS: Lower chest: Calcified pleural plaques as on previous imaging. Heart size is mildly enlarged. Lower chest evaluation limited by arm position. No acute chest wall process. No consolidation or effusion. Hepatobiliary: Marked hepatic steatosis. No focal, suspicious hepatic lesion. No pericholecystic stranding or biliary duct dilation. Pancreas: Normal, without mass, inflammation or ductal dilatation. Spleen: Normal. Adrenals/Urinary Tract: 1.5 cm RIGHT adrenal adenoma shown to be less than 10 Hounsfield units on previous imaging from August 31, 2021. No dedicated follow-up imaging is recommended for this finding. Symmetric renal enhancement. Mild fullness of the RIGHT renal collecting systems and ureter in the setting of moderate urinary bladder distension. Urinary bladder extends to the sacral promontory. Mild fullness of the LEFT ureter as well. 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 Mild perivesical stranding mainly in the space of Retzius. On image 79/3 there is 41 Hounsfield unit density of the distal RIGHT ureter which is focally dilated to approximately 9 mm duplication of the RIGHT ureter is also noted to the level of the distal RIGHT ureter likely. Stomach/Bowel: Small hiatal hernia. No perigastric stranding. No stranding adjacent to small bowel or sign of small bowel obstruction. No sign of acute colonic process. Appendix surgically absent by report. No acute colonic process. Vascular/Lymphatic:  Aortic atherosclerosis. No sign of aneurysm. Smooth contour of the IVC. There is no gastrohepatic or hepatoduodenal ligament lymphadenopathy. No retroperitoneal or mesenteric lymphadenopathy. No pelvic sidewall lymphadenopathy. Reproductive: Prostatomegaly with heterogeneity of the prostate, nonspecific finding on CT. Other: No ascites or pneumoperitoneum. Musculoskeletal: No acute musculoskeletal process. Spinal degenerative changes. IMPRESSION:  1. Findings suspicious for bladder outlet obstruction with resultant bilateral ureteral dilation with mild RIGHT hydronephrosis and minimal collecting system dilation on the LEFT. Stranding adjacent to the urinary bladder may be due to bladder outlet obstruction or cystitis. There is also little if any excretion of contrast into the urinary tract implying physiologic significance of "urinary tract obstruction" despite only mild hydronephrosis currently. 2. Heterogeneity of the prostate while nonspecific shows an indistinct appearance. Correlate with any signs of prostatitis. Follow-up with urology and with PSA assessment may be helpful as well. 3. Question of focal dilation of the distal RIGHT ureter in the setting of duplicated RIGHT renal collecting systems. Appears to be single ureter beyond the sacral promontory. Cystoscopic evaluation and ureteral correlation with direct visualization of the distal RIGHT ureter may be helpful. Enhancement could also be due to relative obstruction and perhaps concomitant urinary tract infection. Correlate also with urinalysis. 4. Marked hepatic steatosis. 5. 1.5 cm RIGHT adrenal adenoma. Based on current clinical literature, is suggested if not already performed. Please refer to current clinical guidelines for detailed recommendations. NEJM 2263:335 154-51 6. Calcified pleural plaques as on previous imaging. 7. Aortic atherosclerosis. Aortic Atherosclerosis (ICD10-I70.0). Electronically Signed   By: Zetta Bills M.D.   On: 03/10/2022  16:52   CT Head Wo Contrast  Result Date: 03/10/2022 CLINICAL DATA:  History of dementia, increased confusion EXAM: CT HEAD WITHOUT CONTRAST TECHNIQUE: Contiguous axial images were obtained from the base of the skull through the vertex without intravenous contrast. RADIATION DOSE REDUCTION: This exam was performed according to the departmental dose-optimization program which includes automated exposure control, adjustment of the mA and/or kV according to patient size and/or use of iterative reconstruction technique. COMPARISON:  07/04/2021 FINDINGS: Brain: No evidence of acute infarction, hemorrhage, mass, mass effect, or midline shift. No hydrocephalus or extra-axial fluid collection. Cerebral atrophy with ex vacuo dilatation of the ventricles, unchanged from the prior exam. Periventricular white matter changes, likely the sequela of chronic small vessel ischemic disease. Calcifications in the bilateral thalami Vascular: No hyperdense vessel. Skull: Normal. Negative for fracture or focal lesion. Sinuses/Orbits: Clear paranasal sinuses. No acute finding in the orbits. Other: The mastoid air cells are well aerated. IMPRESSION: No acute intracranial process. Electronically Signed   By: Merilyn Baba M.D.   On: 03/10/2022 16:43   DG Chest 2 View  Result Date: 03/10/2022 CLINICAL DATA:  Shortness of breath, altered mental status EXAM: CHEST - 2 VIEW COMPARISON:  08/31/2021 and 07/05/2021 FINDINGS: Mild enlargement of the cardiopericardial silhouette noted along with bilateral pleural calcifications and underlying centrilobular emphysema. Low lung volumes are present, causing crowding of the pulmonary vasculature. Indistinct left hemidiaphragm and left heart border raising suspicion for airspace opacity in the left lower lobe and potentially the lingula. The patient is rotated to the left on today's radiograph, reducing diagnostic sensitivity and specificity. Atherosclerotic calcification of the aortic arch. Right  proximal humeral fixator noted. IMPRESSION: 1. Indistinct left hemidiaphragm and left heart border raising suspicion for airspace opacity in the left lower lobe and potentially the lingula. Pneumonia is not excluded. 2. Mild enlargement of the cardiopericardial silhouette, without edema. 3. Aortic Atherosclerosis (ICD10-I70.0) and Emphysema (ICD10-J43.9). 4. 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 Electronically Signed   By: Van Clines M.D.   On: 03/10/2022 15:07    Labs: BNP (last 3 results) No results for input(s): "BNP" in the last 8760 hours. Basic Metabolic Panel: Recent Labs  Lab 03/23/22 1545 03/24/22 0130 03/24/22 4562 03/25/22 0407  03/25/22 1320  NA 132*  --  136 138  --   K 3.4*  --  3.3* 3.1* 4.5  CL 101  --  106 110  --   CO2 21*  --  22 20*  --   GLUCOSE 130*  --  102* 102*  --   BUN 53*  --  40* 27*  --   CREATININE 6.29* 3.38* 2.39* 1.32*  --   CALCIUM 8.1*  --  7.8* 7.7*  --    Liver Function Tests: Recent Labs  Lab 03/23/22 1545 03/24/22 0635  AST 25 22  ALT 24 21  ALKPHOS 64 48  BILITOT 0.9 0.8  PROT 7.1 5.7*  ALBUMIN 3.6 2.8*   No results for input(s): "LIPASE", "AMYLASE" in the last 168 hours. No results for input(s): "AMMONIA" in the last 168 hours. CBC: Recent Labs  Lab 03/23/22 1545 03/24/22 0130 03/24/22 0635  WBC 23.5* 17.3* 13.3*  NEUTROABS 19.8*  --   --   HGB 13.8 12.4* 11.6*  HCT 41.0 37.7* 34.9*  MCV 87.6 86.7 87.5  PLT 390 326 285   Cardiac Enzymes: No results for input(s): "CKTOTAL", "CKMB", "CKMBINDEX", "TROPONINI" in the last 168 hours. BNP: Invalid input(s): "POCBNP" CBG: Recent Labs  Lab 03/23/22 1636  GLUCAP 123*   D-Dimer No results for input(s): "DDIMER" in the last 72 hours. Hgb A1c No results for input(s): "HGBA1C" in the last 72 hours. Lipid Profile No results for input(s): "CHOL", "HDL", "LDLCALC", "TRIG", "CHOLHDL", "LDLDIRECT" in the last 72 hours. Thyroid  function studies No results for input(s): "TSH", "T4TOTAL", "T3FREE", "THYROIDAB" in the last 72 hours.  Invalid input(s): "FREET3" Anemia work up No results for input(s): "VITAMINB12", "FOLATE", "FERRITIN", "TIBC", "IRON", "RETICCTPCT" in the last 72 hours. Urinalysis    Component Value Date/Time   COLORURINE YELLOW 03/23/2022 1630   APPEARANCEUR CLOUDY (A) 03/23/2022 1630   LABSPEC 1.013 03/23/2022 1630   PHURINE 5.0 03/23/2022 1630   GLUCOSEU NEGATIVE 03/23/2022 1630   HGBUR LARGE (A) 03/23/2022 1630   BILIRUBINUR NEGATIVE 03/23/2022 1630   KETONESUR NEGATIVE 03/23/2022 1630   PROTEINUR 30 (A) 03/23/2022 1630   NITRITE NEGATIVE 03/23/2022 1630   LEUKOCYTESUR NEGATIVE 03/23/2022 1630   Sepsis Labs Recent Labs  Lab 03/23/22 1545 03/24/22 0130 03/24/22 0635  WBC 23.5* 17.3* 13.3*   Microbiology Recent Results (from the past 240 hour(s))  Urine Culture     Status: None   Collection Time: 03/23/22  4:30 PM   Specimen: Urine, Clean Catch  Result Value Ref Range Status   Specimen Description   Final    URINE, CLEAN CATCH Performed at [facility], [place] [address] [place], [place] [address]    Special Requests   Final    NONE Performed at [facility], [place] [address], [place], [place] [address]    Culture   Final    NO GROWTH Performed at [facility], [place] [address], [place], [place] [address]    Report Status 03/24/2022 FINAL  Final  Time coordinating discharge: 25 minutes  SIGNED: Antonieta Pert, MD  [facility] 03/25/2022, 1:58 PM  If 7PM-7AM, please contact night-coverage [email or web address]

## 2022-03-25 NOTE — Care Management (Addendum)
Received TOC consult regarding patient going back to [facility]. Per chart review patient's daughter will transport patient back to [facility] via [facility]. Per EDP and RN, discharge prescriptions have been sent to pharmacy.  This RNCM notified Claiborne Billings with [facility] that patient was being discharged back to [place], HHPT/OT, HHA services will need to resume. Kelly confirmed [facility] will continue to follow and no additional [place] orders are needed.  No additional TOC needs.

## 2022-05-10 ENCOUNTER — Encounter (HOSPITAL_COMMUNITY): Payer: Self-pay | Admitting: Emergency Medicine

## 2022-05-10 ENCOUNTER — Emergency Department (HOSPITAL_COMMUNITY)
Admission: EM | Admit: 2022-05-10 | Discharge: 2022-05-10 | Disposition: A | Discharge status: Home / Self Care | Payer: Medicare Other | Attending: Emergency Medicine | Admitting: Emergency Medicine

## 2022-05-10 ENCOUNTER — Other Ambulatory Visit: Payer: Self-pay

## 2022-05-10 DIAGNOSIS — Y732 Prosthetic and other implants, materials and accessory gastroenterology and urology devices associated with adverse incidents: Secondary | ICD-10-CM | POA: Insufficient documentation

## 2022-05-10 DIAGNOSIS — Z8673 Personal history of transient ischemic attack (TIA), and cerebral infarction without residual deficits: Secondary | ICD-10-CM | POA: Diagnosis not present

## 2022-05-10 DIAGNOSIS — E119 Type 2 diabetes mellitus without complications: Secondary | ICD-10-CM | POA: Diagnosis not present

## 2022-05-10 DIAGNOSIS — R339 Retention of urine, unspecified: Secondary | ICD-10-CM | POA: Insufficient documentation

## 2022-05-10 DIAGNOSIS — T83021A Displacement of indwelling urethral catheter, initial encounter: Secondary | ICD-10-CM | POA: Diagnosis not present

## 2022-05-10 DIAGNOSIS — F0390 Unspecified dementia without behavioral disturbance: Secondary | ICD-10-CM | POA: Diagnosis not present

## 2022-05-10 DIAGNOSIS — Z7982 Long term (current) use of aspirin: Secondary | ICD-10-CM | POA: Insufficient documentation

## 2022-05-10 DIAGNOSIS — T839XXA Unspecified complication of genitourinary prosthetic device, implant and graft, initial encounter: Secondary | ICD-10-CM

## 2022-05-10 MED ORDER — LIDOCAINE HCL URETHRAL/MUCOSAL 2 % EX GEL
1.0000 | Freq: Once | CUTANEOUS | Status: AC
  Administered 2022-05-10: 1 via URETHRAL
  Filled 2022-05-10: qty 11

## 2022-05-10 MED ORDER — ACETAMINOPHEN 500 MG PO TABS
1000.0000 mg | ORAL_TABLET | Freq: Once | ORAL | Status: AC
  Administered 2022-05-10: 1000 mg via ORAL
  Filled 2022-05-10: qty 2

## 2022-05-10 NOTE — ED Notes (Signed)
Matthew Knight called facility and report given to Nicole Kindred at [facility]. [facility] here to take patient back to facility.

## 2022-05-10 NOTE — ED Notes (Signed)
Pt to be transported back to [facility] by [place], discharge papers provided, son notified. Pt left ED in stable condition

## 2022-05-10 NOTE — ED Provider Notes (Signed)
[place] EMERGENCY DEPARTMENT AT [facility] Provider Note   CSN: [id number] Arrival date & time: 05/10/22  1155     History  Chief Complaint  Patient presents with   Urinary Retention   Hematuria    Matthew Knight is a 82 y.o. male.  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  It is unclear if he pulled it out or got accidentally ripped out but they were not able to place a new one.  Patient currently has no complaints.  He was last seen by his PCP at the end of January had normal renal function at this time.  The history is provided by the patient, the EMS personnel and medical records.  Hematuria       Home Medications Prior to Admission medications   Medication Sig Start Date End Date Taking? Authorizing Provider  acetaminophen (TYLENOL) 325 MG tablet Take 2 tablets (650 mg total) by mouth every 4 (four) hours as needed for mild pain, moderate pain, fever or headache. 03/11/21   [name], MD  ASPIRIN 81 PO Take 81 mg by mouth daily.    [provider]  Cholecalciferol (VITAMIN D3) 1000 units CAPS Take 1,000 Units by mouth daily.    [provider]  escitalopram (LEXAPRO) 20 MG tablet Take 20 mg by mouth daily. 07/11/17   [provider]  isosorbide mononitrate (IMDUR) 30 MG 24 hr tablet Take 30 mg by mouth daily. 04/28/20   [provider]  latanoprost (XALATAN) 0.005 % ophthalmic solution Place 1 drop into both eyes at bedtime.  07/27/17   [provider]  lisinopril (ZESTRIL) 10 MG tablet Take 10 mg by mouth daily. 02/01/20   [provider]  Multiple Vitamin (MULTIVITAMIN WITH MINERALS) TABS tablet Take 1 tablet by mouth daily. Patient not taking: Reported on 03/10/2022 07/11/21   Raiford Noble Latif, DO  pantoprazole (PROTONIX)  40 MG tablet Take 1 tablet (40 mg total) by mouth daily. Patient taking differently: Take 40 mg by mouth daily before breakfast. 07/11/21   Raiford Noble Latif, DO  polyethylene glycol (MIRALAX) 17 g packet Take 17 g by mouth daily as needed for moderate constipation. Patient not taking: Reported on 03/10/2022 07/04/21   [name], MD  pravastatin (PRAVACHOL) 20 MG tablet Take 20 mg by mouth daily. 04/28/20   [provider]  Prenatal Vit-Fe Fumarate-FA (PNV FOLIC ACID + IRON PO) Take 1 tablet by mouth daily with breakfast.    [provider]  RHOPRESSA 0.02 % SOLN Place 1 drop into both eyes daily. 11/15/18   [provider]  silodosin (RAPAFLO) 8 MG CAPS capsule Take 8 mg by mouth daily. 06/13/21   [provider]      Allergies    Aspirin, Nsaids, Darunavir, Rivastigmine, Heparin, and Sulfa antibiotics    Review of Systems   Review of Systems  Genitourinary:  Positive for hematuria.    Physical Exam Updated Vital Signs BP (!) 146/76   Pulse 85   Temp 97.8 F (36.6 C) (Oral)   Resp (!) 21   SpO2 93%  Physical Exam Vitals and nursing note reviewed.  Constitutional:      General: He is not in acute distress.    Appearance: He is well-developed.  HENT:     Head: Normocephalic and atraumatic.  Eyes:     Conjunctiva/sclera: Conjunctivae normal.     Pupils: Pupils are equal, round, and reactive to light.  Cardiovascular:     Rate and Rhythm: Normal rate and regular rhythm.     Heart sounds: No murmur heard. Pulmonary:     Effort: Pulmonary effort is normal. No respiratory distress.     Breath sounds: Normal breath sounds. No wheezing or rales.  Abdominal:     General: There is no distension.     Palpations: Abdomen is soft.     Tenderness: There is no abdominal tenderness. There is no guarding or rebound.     Comments: No abdominal pain or palpable bladder mass  Genitourinary:    Comments: Small amount of blood at the urethral  meatus Musculoskeletal:        General: No tenderness. Normal range of motion.     Cervical back: Normal range of motion and neck supple.  Skin:    General: Skin is warm and dry.     Findings: No erythema or rash.  Neurological:     Mental Status: He is alert.     Comments: Awake and alert and able to answer questions  Psychiatric:        Behavior: Behavior normal.     ED Results / Procedures / Treatments   Labs (all labs ordered are listed, but only abnormal results are displayed) Labs Reviewed - No data to display  EKG None  Radiology No results found.  Procedures Procedures    Medications Ordered in ED Medications  lidocaine (XYLOCAINE) 2 % jelly 1 Application (1 Application Urethral Given 05/10/22 1325)  lidocaine (XYLOCAINE) 2 % jelly 1 Application (1 Application Urethral Given 05/10/22 1434)  acetaminophen (TYLENOL) tablet 1,000 mg (1,000 mg Oral Given 05/10/22 1434)    ED Course/ Medical Decision Making/ A&P                             Medical Decision Making Risk OTC drugs.   Patient presenting today after his Foley catheter was dislodged.  Unclear at the circumstance of how it became dislodged but they were unable to put a new catheter in.  He does have a little bit of blood in his urethral meatus but has no complaints.  He has no abdominal pain and low suspicion for significant urinary retention at this time.  Will attempt to place a new Foley catheter.  2:58 PM Foley catheter now in place and draining urine.  Patient appears stable for discharge back to where he lives.  He has no complaints at this time.        Final Clinical Impression(s) / ED Diagnoses Final diagnoses:  Problem with Foley catheter, initial encounter [facility])    Rx / DC Orders ED Discharge Orders     None         Blanchie Dessert, MD 05/10/22 1458

## 2022-05-10 NOTE — ED Triage Notes (Signed)
Patient presents due to removing his foley this morning. It is unclear if this was done accidentally or purposefully. Blood noted with urination.

## 2022-05-22 ENCOUNTER — Other Ambulatory Visit: Payer: Self-pay

## 2022-05-22 ENCOUNTER — Emergency Department (HOSPITAL_COMMUNITY)
Admission: EM | Admit: 2022-05-22 | Discharge: 2022-05-22 | Disposition: A | Discharge status: Home / Self Care | Payer: Medicare Other | Attending: Emergency Medicine | Admitting: Emergency Medicine

## 2022-05-22 ENCOUNTER — Encounter (HOSPITAL_COMMUNITY): Payer: Self-pay

## 2022-05-22 DIAGNOSIS — T83091A Other mechanical complication of indwelling urethral catheter, initial encounter: Secondary | ICD-10-CM | POA: Insufficient documentation

## 2022-05-22 DIAGNOSIS — Z7982 Long term (current) use of aspirin: Secondary | ICD-10-CM | POA: Insufficient documentation

## 2022-05-22 DIAGNOSIS — E119 Type 2 diabetes mellitus without complications: Secondary | ICD-10-CM | POA: Insufficient documentation

## 2022-05-22 DIAGNOSIS — R10819 Abdominal tenderness, unspecified site: Secondary | ICD-10-CM | POA: Diagnosis not present

## 2022-05-22 DIAGNOSIS — F0390 Unspecified dementia without behavioral disturbance: Secondary | ICD-10-CM | POA: Insufficient documentation

## 2022-05-22 DIAGNOSIS — R339 Retention of urine, unspecified: Secondary | ICD-10-CM | POA: Diagnosis present

## 2022-05-22 DIAGNOSIS — F17200 Nicotine dependence, unspecified, uncomplicated: Secondary | ICD-10-CM | POA: Diagnosis not present

## 2022-05-22 NOTE — ED Notes (Signed)
Received patient from  facility. AAOX3 at baseline. Difficulty

## 2022-05-22 NOTE — ED Notes (Signed)
Pt waiting for PTAR. States 13 patients ahead of him

## 2022-05-22 NOTE — Discharge Instructions (Addendum)
It was a pleasure caring for you today in the emergency department. ° °Please return to the emergency department for any worsening or worrisome symptoms. ° ° °

## 2022-05-22 NOTE — ED Notes (Signed)
Called twice, attempt to give report, unsuccessful Phone: [phone]. Spoke with [place], transferred to two different units, unable to give report

## 2022-05-22 NOTE — ED Notes (Signed)
Received patient from facility, Aaox3 at baseline. Hx of dementia. Reports was sent over for urinary retention. Catheter in place on arrival, urine noted in bag. MAE,resp even and unlabored. NAD. MD at bedside

## 2022-05-22 NOTE — ED Notes (Signed)
Attempted to call report to facility, they state they don't have a nurse to speak at this time.   PTAR called and patient is on 13 in front of him.

## 2022-05-22 NOTE — ED Notes (Signed)
Attempt to irrigate foley

## 2022-05-22 NOTE — ED Provider Notes (Signed)
[facility] Provider Note  CSN: [id number] Arrival date & time: 05/22/22 1258  Chief Complaint(s) Urinary Retention  HPI Matthew Knight is a 82 y.o. male with past medical history as below, significant for dementia, DM, CVA, indwelling Foley catheter, BPH who presents to the ED with complaint of problem with Foley.  Patient is demented, he is not sure why he is here.  Does report he is having some difficulty with urination.  Patient is unaware that he has a indwelling Foley catheter.  Catheter appears to be draining appropriately.  Bladder scan with 6 mL on arrival.  Denies fevers, chills, nausea or vomiting, no change to bowel function.  He has mild suprapubic abdominal pain.  No chest pain or dyspnea.  No rashes.  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   AKI (acute kidney injury) ([place]) 03/10/2022   Rectal bleeding 07/02/2021   GIB (gastrointestinal bleeding) 07/01/2021   BPH (benign prostatic hyperplasia) 07/01/2021   HLD (hyperlipidemia) 07/01/2021   Iron deficiency anemia due to chronic blood loss    Symptomatic anemia    GI bleed 03/19/2021   Leukocytosis 03/19/2021   Renal insufficiency 03/19/2021   Fall at home, initial encounter 03/19/2021   Heme positive stool    Acute blood loss anemia    Platelet inhibition due to Plavix    Orthostatic hypotension 03/10/2021   Acute left-sided weakness 03/09/2021   Closed fracture distal radius and  ulna, left, sequela 03/09/2021   History of TIA (transient ischemic attack) 03/09/2021   Abnormal ECG 04/28/2020   Snoring 04/28/2020   Anxiety 11/22/2018   Dementia without behavioral disturbance ([place]) 11/22/2018   Depression 11/22/2018   Exposure to Agent [facility] 11/22/2018   Small vessel disease, 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   Dizziness 10/24/2018   Benign essential HTN 11/11/2017   Bradycardia 11/11/2017   Near syncope 11/10/2017   Onychomycosis 04/19/2017   Memory loss 01/22/2017   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   Iliac artery aneurysm, 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 Authorizing Provider  acetaminophen (TYLENOL) 325 MG tablet Take 2 tablets (650 mg total) by mouth every 4 (four) hours as needed for mild pain, moderate pain, fever or headache. 03/11/21   [name], MD  ASPIRIN 81 PO Take 81 mg by mouth daily.    [provider]  Cholecalciferol (VITAMIN D3) 1000 units CAPS Take 1,000 Units by mouth daily.    [provider]  escitalopram (LEXAPRO) 20 MG tablet Take 20 mg by mouth daily. 07/11/17   [provider]  isosorbide mononitrate (IMDUR) 30 MG 24 hr tablet Take 30 mg by mouth daily. 04/28/20   [provider]  latanoprost (XALATAN) 0.005 % ophthalmic solution Place 1 drop into both eyes at bedtime.  07/27/17   [provider]  lisinopril (ZESTRIL) 10 MG tablet Take 10 mg by mouth daily.  02/01/20   [provider]  Multiple Vitamin (MULTIVITAMIN WITH MINERALS) TABS tablet Take 1 tablet by mouth daily. Patient not taking: Reported on 03/10/2022 07/11/21   Raiford Noble Latif, DO  pantoprazole (PROTONIX) 40 MG tablet Take 1 tablet (40 mg total) by mouth daily. Patient taking differently: Take 40 mg by mouth daily before breakfast. 07/11/21   Raiford Noble Latif, DO  polyethylene glycol (MIRALAX) 17 g packet Take 17 g by mouth daily as needed for moderate constipation. Patient not taking: Reported on 03/10/2022 07/04/21   [name], MD  pravastatin (PRAVACHOL) 20 MG tablet Take 20 mg by mouth daily. 04/28/20   [provider]  Prenatal Vit-Fe Fumarate-FA (PNV FOLIC ACID + IRON PO) Take 1 tablet by mouth daily with breakfast.    [provider]  RHOPRESSA 0.02 % SOLN Place 1 drop into both eyes daily. 11/15/18   [provider]  silodosin (RAPAFLO) 8 MG CAPS capsule Take 8 mg by mouth daily. 06/13/21   [provider]                                                                                                                                    Past Surgical History Past Surgical History:  Procedure Laterality Date   APPENDECTOMY     BIOPSY  07/08/2021   Procedure: BIOPSY;  Surgeon: Wilford Corner, MD;  Location: [facility];  Service: Gastroenterology;;   COLONOSCOPY WITH PROPOFOL N/A 03/24/2021   Procedure: COLONOSCOPY WITH PROPOFOL;  Surgeon: Mauri Pole, MD;  Location: [place] ENDOSCOPY;  Service: Endoscopy;  Laterality: N/A;   COLONOSCOPY WITH PROPOFOL N/A 07/08/2021   Procedure: COLONOSCOPY WITH PROPOFOL;  Surgeon: Wilford Corner, MD;  Location: [place];  Service: Gastroenterology;  Laterality: N/A;   ESOPHAGOGASTRODUODENOSCOPY (EGD) WITH PROPOFOL N/A 03/24/2021   Procedure: ESOPHAGOGASTRODUODENOSCOPY (EGD) WITH PROPOFOL;  Surgeon: Mauri Pole, MD;  Location: [facility] ENDOSCOPY;  Service: Endoscopy;  Laterality: N/A;   GIVENS  CAPSULE STUDY N/A 07/05/2021   Procedure: GIVENS CAPSULE STUDY;  Surgeon: Wilford Corner, MD;  Location: [facility];  Service: Gastroenterology;  Laterality: N/A;   HEMOSTASIS CLIP PLACEMENT  03/24/2021   Procedure: HEMOSTASIS CLIP PLACEMENT;  Surgeon: Mauri Pole, MD;  Location: [facility];  Service: Endoscopy;;   HOT HEMOSTASIS N/A 03/24/2021   Procedure: HOT HEMOSTASIS (ARGON PLASMA COAGULATION/BICAP);  Surgeon: Mauri Pole, MD;  [facility] ENDOSCOPY;  Service: Endoscopy;  Laterality: N/A;   HOT HEMOSTASIS N/A 07/08/2021   Procedure: HOT HEMOSTASIS (  ARGON PLASMA COAGULATION/BICAP);  Surgeon: Wilford Corner, MD;  Location: [facility];  Service: Gastroenterology;  Laterality: N/A;   POLYPECTOMY  03/24/2021   Procedure: POLYPECTOMY;  Surgeon: Mauri Pole, MD;  Location: [facility] ENDOSCOPY;  Service: Endoscopy;;   POLYPECTOMY  07/08/2021   Procedure: POLYPECTOMY;  Surgeon: Wilford Corner, MD;  Location[facility] ENDOSCOPY;  Service: Gastroenterology;;   Family History Family History  Problem Relation Age of Onset   Diabetes Mother    Heart disease Father    Memory loss Paternal Grandfather     Social History Social History   Tobacco Use   Smoking status: Every Day    Types: Pipe   Smokeless tobacco: Never  Vaping Use   Vaping Use: Never used  Substance Use Topics   Alcohol use: No    Alcohol/week: 0.0 standard drinks of alcohol   Drug use: No   Allergies Aspirin, Nsaids, Darunavir, Rivastigmine, Heparin, and Sulfa antibiotics  Review of Systems Review of Systems  Constitutional:  Negative for chills and fever.  Respiratory:  Negative for cough and shortness of breath.   Cardiovascular:  Negative for chest pain.  Gastrointestinal:  Positive for abdominal pain. Negative for nausea and vomiting.  Genitourinary:  Positive for difficulty urinating. Negative for penile pain.    Physical Exam Vital Signs  I have reviewed the triage vital signs BP (!)  141/94 (BP Location: Right Arm)   Pulse (!) 108   Temp 98.2 F (36.8 C) (Oral)   Resp 18   Ht 6' (1.829 m)   Wt 107 kg   SpO2 95%   BMI 31.99 kg/m  Physical Exam Vitals and nursing note reviewed. Exam conducted with a chaperone present.  Constitutional:      General: He is not in acute distress.    Appearance: Normal appearance. He is well-developed.  HENT:     Head: Normocephalic and atraumatic.     Right Ear: External ear normal.     Left Ear: External ear normal.     Mouth/Throat:     Mouth: Mucous membranes are moist.  Eyes:     General: No scleral icterus. Cardiovascular:     Rate and Rhythm: Normal rate and regular rhythm.     Pulses: Normal pulses.     Heart sounds: Normal heart sounds.  Pulmonary:     Effort: Pulmonary effort is normal. No respiratory distress.     Breath sounds: Normal breath sounds.  Abdominal:     General: Abdomen is flat.     Palpations: Abdomen is soft.     Tenderness: There is abdominal tenderness. There is no guarding or rebound.     Comments: Foley catheter present,  Musculoskeletal:     Right lower leg: No edema.     Left lower leg: No edema.  Skin:    General: Skin is warm and dry.     Capillary Refill: Capillary refill takes less than 2 seconds.  Neurological:     Mental Status: He is alert and oriented to person, place, and time.     GCS: GCS eye subscore is 4. GCS verbal subscore is 5. GCS motor subscore is 6.  Psychiatric:        Mood and Affect: Mood normal.        Behavior: Behavior normal.     ED Results and Treatments Labs (all labs ordered are listed, but only abnormal results are displayed) Labs Reviewed - No data to display  Radiology No results found.  Pertinent labs & imaging results that were available during my care of the patient were reviewed by me and considered in my medical decision making  (see MDM for details).  Medications Ordered in ED Medications - No data to display                                                                                                                                   Procedures Procedures  (including critical care time)  Medical Decision Making / ED Course    Medical Decision Making:    Matthew Knight is a 82 y.o. male with past medical history as below, significant for dementia, DM, CVA, indwelling Foley catheter, BPH who presents to the ED with complaint of problem with Foley.. The complaint involves an extensive differential diagnosis and also carries with it a high risk of complications and morbidity.  Serious etiology was considered. Ddx includes but is not limited to: Differential diagnosis includes but is not exclusive to acute appendicitis, renal colic, testicular torsion, urinary tract infection, prostatitis,  diverticulitis, small bowel obstruction, colitis, abdominal aortic aneurysm, gastroenteritis, constipation etc.   Complete initial physical exam performed, notably the patient  was no acute distress, resting comfortably.  He has mild TTP suprapubic. Foley in place    Reviewed and confirmed nursing documentation for past medical history, family history, social history.  Vital signs reviewed.    Clinical Course as of 05/22/22 1543  Sat May 22, 2022  1453 Foley irrigated/exchanged. Pain resolved entirely- feeling back to baseline  [SG]    Clinical Course User Index [SG] Jeanell Sparrow, DO    Catheter exchange, patient symptoms are resolved.  Voiding spontaneously and catheter.  Pain has resolved. This is likely source of his discomfort which at this point has resolved. Stable for dc back to facility. Printed foley care sheet for facility.   The patient improved significantly and was discharged in stable condition. Detailed discussions were had with the patient regarding current findings, and need for close f/u with PCP  or on call doctor. The patient has been instructed to return immediately if the symptoms worsen in any way for re-evaluation. Patient verbalized understanding and is in agreement with current care plan. All questions answered prior to discharge.     Additional history obtained: -Additional history obtained from na -External records from outside source obtained and reviewed including: Chart review including previous notes, labs, imaging, consultation notes including prior ED visits, prior admission, prior labs and imaging, home medications   Lab Tests: na  EKG   EKG Interpretation  Date/Time:    Ventricular Rate:    PR Interval:    QRS Duration:   QT Interval:    QTC Calculation:   R Axis:     Text Interpretation:           Imaging Studies ordered: I ordered imaging studies including na  Medicines ordered and prescription drug management: No orders of the defined types were placed in this encounter.   -I have reviewed the patients home medicines and have made adjustments as needed   Consultations Obtained: na   Cardiac Monitoring: The patient was maintained on a cardiac monitor.  I personally viewed and interpreted the cardiac monitored which showed an underlying rhythm of: nsr  Social Determinants of Health:  Diagnosis or treatment significantly limited by social determinants of health: former smoker   Reevaluation: After the interventions noted above, I reevaluated the patient and found that they have resolved  Co morbidities that complicate the patient evaluation  Past Medical History:  Diagnosis Date   Dementia ([place])    Diabetes mellitus without complication ([place])    Hearing loss    Hepatitis    Small vessel disease ([place])    Stroke ([place])       Dispostion: Disposition decision including need for hospitalization was considered, and patient discharged from emergency department.    Final Clinical Impression(s) / ED Diagnoses Final diagnoses:   Obstruction of Foley catheter, initial encounter [facility])     This chart was dictated using voice recognition software.  Despite best efforts to proofread,  errors can occur which can change the documentation meaning.    Wynona Dove A, DO 05/22/22 1544

## 2022-06-08 ENCOUNTER — Emergency Department (HOSPITAL_COMMUNITY)
Admission: EM | Admit: 2022-06-08 | Discharge: 2022-06-08 | Disposition: A | Discharge status: Skilled Nursing Facility | Payer: Medicare Other | Attending: Emergency Medicine | Admitting: Emergency Medicine

## 2022-06-08 ENCOUNTER — Emergency Department (HOSPITAL_COMMUNITY): Payer: Medicare Other

## 2022-06-08 ENCOUNTER — Encounter (HOSPITAL_COMMUNITY): Payer: Self-pay | Admitting: Radiology

## 2022-06-08 DIAGNOSIS — Z7982 Long term (current) use of aspirin: Secondary | ICD-10-CM | POA: Insufficient documentation

## 2022-06-08 DIAGNOSIS — F419 Anxiety disorder, unspecified: Secondary | ICD-10-CM | POA: Insufficient documentation

## 2022-06-08 DIAGNOSIS — R339 Retention of urine, unspecified: Secondary | ICD-10-CM | POA: Diagnosis not present

## 2022-06-08 DIAGNOSIS — R109 Unspecified abdominal pain: Secondary | ICD-10-CM | POA: Insufficient documentation

## 2022-06-08 DIAGNOSIS — F0390 Unspecified dementia without behavioral disturbance: Secondary | ICD-10-CM | POA: Diagnosis not present

## 2022-06-08 DIAGNOSIS — Z79899 Other long term (current) drug therapy: Secondary | ICD-10-CM | POA: Diagnosis not present

## 2022-06-08 DIAGNOSIS — R319 Hematuria, unspecified: Secondary | ICD-10-CM | POA: Diagnosis not present

## 2022-06-08 LAB — URINALYSIS, ROUTINE W REFLEX MICROSCOPIC
Bacteria, UA: NONE SEEN
Bilirubin Urine: NEGATIVE
Glucose, UA: NEGATIVE mg/dL
Ketones, ur: NEGATIVE mg/dL
Nitrite: POSITIVE — AB
Protein, ur: 30 mg/dL — AB
RBC / HPF: >50 RBC/hpf (ref 0–5)
Specific Gravity, Urine: 1.014 (ref 1.005–1.030)
pH: 5 (ref 5.0–8.0)

## 2022-06-08 MED ORDER — CEFDINIR 300 MG PO CAPS: 300.0000 mg | ORAL_CAPSULE | Freq: Every day | ORAL | 0 refills | Status: AC

## 2022-06-08 MED ORDER — CEFDINIR 300 MG PO CAPS
300.0000 mg | ORAL_CAPSULE | Freq: Every day | ORAL | Status: DC
  Administered 2022-06-08: 300 mg via ORAL
  Filled 2022-06-08: qty 1

## 2022-06-08 MED ORDER — CEFDINIR 300 MG PO CAPS: 300.0000 mg | ORAL_CAPSULE | Freq: Every day | ORAL | 0 refills | Status: DC

## 2022-06-08 MED ORDER — HYDROMORPHONE HCL 1 MG/ML IJ SOLN
1.0000 mg | Freq: Once | INTRAMUSCULAR | Status: AC
  Administered 2022-06-08: 1 mg via INTRAMUSCULAR
  Filled 2022-06-08: qty 1

## 2022-06-08 MED ORDER — CEFDINIR 300 MG PO CAPS: 300.0000 mg | ORAL_CAPSULE | Freq: Two times a day (BID) | ORAL | 0 refills | Status: DC

## 2022-06-08 MED ORDER — CEFDINIR 300 MG PO CAPS: 300.0000 mg | ORAL_CAPSULE | Freq: Two times a day (BID) | ORAL | Status: DC

## 2022-06-08 NOTE — ED Provider Notes (Signed)
[place] EMERGENCY DEPARTMENT AT [facility] Provider Note   CSN: [id number] Arrival date & time: 06/08/22  1100     History  Chief Complaint  Patient presents with   Abdominal Pain   Hematuria    Matthew Knight is a 82 y.o. male with a history of dementia, urinary retention, chronic indwelling urethral Foley, presenting from a nursing facility with concern for abdominal pain and bladder fullness.  Patient cannot provide any history and is level 5 caveat due to advanced dementia.  He has been shouting about abdominal pain.  He was noted to have some blood-tinged urine at his facility over the past 24 hours.  HPI     Home Medications Prior to Admission medications   Medication Sig Start Date End Date Taking? Authorizing Provider  acetaminophen (TYLENOL) 325 MG tablet Take 2 tablets (650 mg total) by mouth every 4 (four) hours as needed for mild pain, moderate pain, fever or headache. 03/11/21   [name], MD  ASPIRIN 81 PO Take 81 mg by mouth daily.    [provider]  cefdinir (OMNICEF) 300 MG capsule Take 1 capsule (300 mg total) by mouth daily for 7 days. 06/08/22 06/15/22  Wyvonnia Dusky, MD  Cholecalciferol (VITAMIN D3) 1000 units CAPS Take 1,000 Units by mouth daily.    [provider]  escitalopram (LEXAPRO) 20 MG tablet Take 20 mg by mouth daily. 07/11/17   [provider]  isosorbide mononitrate (IMDUR) 30 MG 24 hr tablet Take 30 mg by mouth daily. 04/28/20   [provider]  latanoprost (XALATAN) 0.005 % ophthalmic solution Place 1 drop into both eyes at bedtime.  07/27/17   [provider]  lisinopril (ZESTRIL) 10 MG tablet Take 10 mg by mouth daily. 02/01/20   [provider]  Multiple Vitamin (MULTIVITAMIN WITH MINERALS) TABS tablet Take 1 tablet by mouth daily. Patient not taking: Reported on 03/10/2022 07/11/21   Raiford Noble Latif, DO  pantoprazole (PROTONIX) 40 MG tablet Take 1 tablet (40 mg total)  by mouth daily. Patient taking differently: Take 40 mg by mouth daily before breakfast. 07/11/21   Raiford Noble Latif, DO  polyethylene glycol (MIRALAX) 17 g packet Take 17 g by mouth daily as needed for moderate constipation. Patient not taking: Reported on 03/10/2022 07/04/21   [name], MD  pravastatin (PRAVACHOL) 20 MG tablet Take 20 mg by mouth daily. 04/28/20   [provider]  Prenatal Vit-Fe Fumarate-FA (PNV FOLIC ACID + IRON PO) Take 1 tablet by mouth daily with breakfast.    [provider]  RHOPRESSA 0.02 % SOLN Place 1 drop into both eyes daily. 11/15/18   [provider]  silodosin (RAPAFLO) 8 MG CAPS capsule Take 8 mg by mouth daily. 06/13/21   [provider]      Allergies    Aspirin, Nsaids, Darunavir, Rivastigmine, Heparin, and Sulfa antibiotics    Review of Systems   Review of Systems  Physical Exam Updated Vital Signs BP (!) 152/70   Pulse 76   Temp 98.5 F (36.9 C)   Resp 18   SpO2 91%  Physical Exam Constitutional:      General: He is not in acute distress. HENT:     Head: Normocephalic and atraumatic.  Eyes:     Conjunctiva/sclera: Conjunctivae normal.     Pupils: Pupils are equal, round, and reactive to light.  Cardiovascular:     Rate and Rhythm: Normal rate and regular rhythm.  Pulmonary:     Effort: Pulmonary effort is normal. No respiratory distress.  Abdominal:     General: There is no distension.     Tenderness: There is no abdominal tenderness.  Genitourinary:    Comments: Indwelling Foley shows blood-tinged urine, at a slow trickle out from the bladder, there is some suprapubic pressure.  Foley catheter appears to have been pulled downwards through the urethral meatus, with no active bleeding or swelling around the insertion site Skin:    General: Skin is warm and dry.  Neurological:     General: No focal deficit present.     Mental Status: He is alert. Mental status is at baseline.     ED Results /  Procedures / Treatments   Labs (all labs ordered are listed, but only abnormal results are displayed) Labs Reviewed  URINALYSIS, ROUTINE W REFLEX MICROSCOPIC - Abnormal; Notable for the following components:      Result Value   APPearance HAZY (*)    Hgb urine dipstick LARGE (*)    Protein, ur 30 (*)    Nitrite POSITIVE (*)    Leukocytes,Ua SMALL (*)    All other components within normal limits  URINE CULTURE    EKG None  Radiology CT PELVIS WO CONTRAST  Result Date: 06/08/2022 CLINICAL DATA:  Evaluate Foley catheter position. EXAM: CT PELVIS WITHOUT CONTRAST TECHNIQUE: Multidetector CT imaging of the pelvis was performed following the standard protocol without intravenous contrast. RADIATION DOSE REDUCTION: This exam was performed according to the departmental dose-optimization program which includes automated exposure control, adjustment of the mA and/or kV according to patient size and/or use of iterative reconstruction technique. COMPARISON:  03/10/2022 FINDINGS: Urinary Tract: The Foley catheter is not in the bladder. The the balloon is blown up in the prostatic urethra. This needs to be reposition. Moderate distention of the bladder is noted with a small amount of gas. No bladder mass or calculi. Bowel: The rectum, sigmoid colon and visualized pelvic bowel loops are unremarkable. Vascular/Lymphatic: Moderate atherosclerotic calcifications no aneurysm. Scattered small pelvic and inguinal lymph nodes but no mass or overt adenopathy. Reproductive: Markedly enlarged prostate gland with median lobe hypertrophy impressing on the base of the bladder. The seminal vesicles are unremarkable. Other: No free pelvic fluid collections. No inguinal mass or hernia. Musculoskeletal: No significant bony findings. IMPRESSION: 1. The Foley catheter is not in the bladder. The balloon is blown up in the prostatic urethra. This needs to be repositioned. 2. Moderate distention of the bladder with a small amount of  gas. 3. Markedly enlarged prostate gland with median lobe hypertrophy impressing on the base of the bladder. Electronically Signed   By: Marijo Sanes M.D.   On: 06/08/2022 12:56    Procedures Procedures    Medications Ordered in ED Medications  cefdinir (OMNICEF) capsule 300 mg (has no administration in time range)  HYDROmorphone (DILAUDID) injection 1 mg (1 mg Intramuscular Given 06/08/22 1133)  HYDROmorphone (DILAUDID) injection 1 mg (1 mg Intramuscular Given 06/08/22 1453)    ED Course/ Medical Decision Making/ A&P Clinical Course as of 06/08/22 1645  Tue Jun 08, 2022  1409 Paged urology Dr Tresa Moore [MT]  1410 Based on CT imaging we attempted to decompress the lumen and passed the Foley deeper into the bladder, but were not able to obtain any type of drainage from the bladder or visualize it on bedside ultrasound.  The patient also appeared significantly uncomfortable during this procedure.  This attempt was aborted and the Foley catheter  was ultimately removed, with frank blood noted from the urethral meatus afterwards.  I will consult with urology. [MT]  1503 Dr Tresa Moore urology consulted and requesting urocart to bedside, he will come evaluate patient. [MT]  1517 Dr Tresa Moore was able to easily replace foley with drainage - patient with relief, would be okay for discharge to facility [MT]    Clinical Course User Index [MT] Venisha Boehning, Carola Rhine, MD                             Medical Decision Making Amount and/or Complexity of Data Reviewed Labs: ordered. Radiology: ordered.  Risk Prescription drug management.   Patient is here with complaint of abdominal pain and hematuria, which is likely related to self-induced trauma to his Foley site as he apparently anxious frequently on Foley catheter.  There is no active hemorrhage or bleeding around the site of insertion.  Bedside ultrasound was able to determine that we were able to flush the Foley catheter but was unable to visualize the balloon  inside the bladder.  I wonder if it was dislodged, I have ordered a CT pelvis to evaluate for catheter placement.  IM Dilaudid ordered for pain and agitation.  I will lower suspicion for alternative intra-abdominal source of the patient's discomfort, he does not have focal guarding or tenderness of the abdomen or GI symptoms  UA with signs of potential infection positive nitrites.  Will send a urine culture start the patient on antibiotics, likely at reduced dosing given his prior poor renal function.  Patient had received 2 doses of hydromorphone for pain with improvement in the ED.  He is stable for discharge.  I was able to update his daughter by phone        Final Clinical Impression(s) / ED Diagnoses Final diagnoses:  Urinary retention    Rx / DC Orders ED Discharge Orders          Ordered    cefdinir (OMNICEF) 300 MG capsule  2 times daily,   Status:  Discontinued        06/08/22 1622    cefdinir (OMNICEF) 300 MG capsule  Daily,   Status:  Discontinued        06/08/22 1640    cefdinir (OMNICEF) 300 MG capsule  Daily        06/08/22 1641              Wyvonnia Dusky, MD 06/08/22 1645

## 2022-06-08 NOTE — ED Notes (Signed)
Pt complains of pain increasing when he needs to pee

## 2022-06-08 NOTE — ED Triage Notes (Signed)
Pt BIBA from [facility] for hematuria starting this morning. Also reports abdominal pain due to full bladder. Hx dementia. No thinners.  152/84 HR 107 92% RA CBG 139

## 2022-06-08 NOTE — Discharge Instructions (Addendum)
Matthew Knight was seen in the ER with pain and blood around the catheter site.  This is likely due to the fact that he may have tried to pull the catheter out.  Our urologist was able to remove the catheter and put a new one in, and relieve the pressure on his bladder.  The urinalysis showed potential signs of infection and so we started him on Omnicef antibiotic.  He will need to have the catheter exchanged at least once a month.  I recommended follow-up with the urologist if possible at the number above, as Matthew Knight has a very large prostate, and exchanging a catheter may be difficult.  There would likely continue to be some bloody drainage from the catheter site and around the insertion site for the next few days from the initial trauma.  This should gradually become lighter and clear up.

## 2022-06-08 NOTE — Consult Note (Signed)
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 male.   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 Follwos with Dahlstedt, failed trial of void x several.   Today " RIck" is seen as urgent consult for above. He pulled his catheter partially out and now cannot be repalced with bleeding per penis c/w self-inflicted foley trauma.   Past Medical History:  Diagnosis Date   Dementia    Diabetes mellitus without complication    Hearing loss    Hepatitis    Small vessel disease    Stroke     Past Surgical History:  Procedure Laterality Date   APPENDECTOMY     BIOPSY  07/08/2021   Procedure: BIOPSY;  Surgeon: Wilford Corner, MD;  Location: [place];  Service: Gastroenterology;;   COLONOSCOPY WITH PROPOFOL N/A 03/24/2021   Procedure: COLONOSCOPY WITH PROPOFOL;  Surgeon: Mauri Pole, MD;  Location: [place] [facility];  Service: Endoscopy;  Laterality: N/A;   COLONOSCOPY WITH PROPOFOL N/A 07/08/2021   Procedure: COLONOSCOPY WITH PROPOFOL;  Surgeon: Wilford Corner, MD;  Location: [place];  Service: Gastroenterology;  Laterality: N/A;   ESOPHAGOGASTRODUODENOSCOPY (EGD) WITH PROPOFOL N/A 03/24/2021   Procedure: ESOPHAGOGASTRODUODENOSCOPY (EGD) WITH PROPOFOL;  Surgeon: Mauri Pole, MD;  Location: [place] [facility];  Service: Endoscopy;  Laterality: N/A;   GIVENS CAPSULE STUDY N/A 07/05/2021   Procedure: GIVENS CAPSULE STUDY;  Surgeon: Wilford Corner, MD;  Location: [place];  Service: Gastroenterology;  Laterality: N/A;   HEMOSTASIS CLIP PLACEMENT  03/24/2021   Procedure: HEMOSTASIS CLIP PLACEMENT;  Surgeon: Mauri Pole, MD;  Location: [place];  Service: Endoscopy;;   HOT HEMOSTASIS N/A 03/24/2021   Procedure: HOT HEMOSTASIS (ARGON PLASMA  COAGULATION/BICAP);  Surgeon: Mauri Pole, MD;  Location[facility] ENDOSCOPY;  Service: Endoscopy;  Laterality: N/A;   HOT HEMOSTASIS N/A 07/08/2021   Procedure: HOT HEMOSTASIS (ARGON PLASMA COAGULATION/BICAP);  Surgeon: Wilford Corner, MD;  Location: [place];  Service: Gastroenterology;  Laterality: N/A;   POLYPECTOMY  03/24/2021   Procedure: POLYPECTOMY;  Surgeon: Mauri Pole, MD;  Location: [place] [facility];  Service: Endoscopy;;   POLYPECTOMY  07/08/2021   Procedure: POLYPECTOMY;  Surgeon: Wilford Corner, MD;  Location: [facility] ENDOSCOPY;  Service: Gastroenterology;;    Family History  Problem Relation Age of Onset   Diabetes Mother    Heart disease Father    Memory loss Paternal Grandfather     Social History:  reports that he has been smoking pipe. He has never used smokeless tobacco. He reports that he does not drink alcohol and does not use drugs.  Allergies:  Allergies  Allergen Reactions   Aspirin Other (See Comments)    "BLEEDS OUT INTERNALLY"   Nsaids Other (See Comments)    NO ANTI-COAGULANTS, EITHER!!!! "BLEEDS OUT INTERNALLY" (ONLY Tylenol is tolerated)   Darunavir Other (See Comments)    Eruption of the skin   Rivastigmine Other (See Comments)    Brand name only for the patch- "otherwise, he will have a violent reaction"   Heparin Other (See Comments)    Pt requests NO ANTICOAGULANTS.  Has varices with hx of bleeding.   Sulfa Antibiotics Other (See Comments)    States this "made him crazy"    Medications: I have reviewed the patient's current medications.  No results found for this  or any previous visit (from the past 48 hour(s)).  CT PELVIS WO CONTRAST  Result Date: 06/08/2022 CLINICAL DATA:  Evaluate Foley catheter position. EXAM: CT PELVIS WITHOUT CONTRAST TECHNIQUE: Multidetector CT imaging of the pelvis was performed following the standard protocol without intravenous contrast. RADIATION DOSE REDUCTION: This exam was performed according to the  departmental dose-optimization program which includes automated exposure control, adjustment of the mA and/or kV according to patient size and/or use of iterative reconstruction technique. COMPARISON:  03/10/2022 FINDINGS: Urinary Tract: The Foley catheter is not in the bladder. The the balloon is blown up in the prostatic urethra. This needs to be reposition. Moderate distention of the bladder is noted with a small amount of gas. No bladder mass or calculi. Bowel: The rectum, sigmoid colon and visualized pelvic bowel loops are unremarkable. Vascular/Lymphatic: Moderate atherosclerotic calcifications no aneurysm. Scattered small pelvic and inguinal lymph nodes but no mass or overt adenopathy. Reproductive: Markedly enlarged prostate gland with median lobe hypertrophy impressing on the base of the bladder. The seminal vesicles are unremarkable. Other: No free pelvic fluid collections. No inguinal mass or hernia. Musculoskeletal: No significant bony findings. IMPRESSION: 1. The Foley catheter is not in the bladder. The balloon is blown up in the prostatic urethra. This needs to be repositioned. 2. Moderate distention of the bladder with a small amount of gas. 3. Markedly enlarged prostate gland with median lobe hypertrophy impressing on the base of the bladder. Electronically Signed   By: Marijo Sanes M.D.   On: 06/08/2022 12:56    Review of Systems  Unable to perform ROS: Dementia   Blood pressure 137/71, pulse 98, temperature 98.5 F (36.9 C), resp. rate 18, SpO2 94 %. Physical Exam Constitutional:      Comments: AOx 0. Withdrawls to pain. ON ER stretcher. Not combative.   HENT:     Head: Normocephalic.  Pulmonary:     Effort: Pulmonary effort is normal.  Abdominal:     General: Abdomen is flat.  Genitourinary:    Comments: UNcircumcised, seom blood at meatus, not constant. Some SP distention / TTP.  Skin:    General: Skin is warm.    Using aseptic technique, 61F coude foley carefully placed  with immediate efflux of 1067mL straw colored urien. No blood in bladder. 1mL water placed in balloon. Some scant blood at meatus only (c/w prostative / urethral blood as expected).   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 This will allow minor prostatic urethra trauma to heal around it and treat retnetion. Unfortuantely he may be catheter dependant lifelong as he is not operative candidate.   Has Urol FU scheduled on 4/12 to consider cysto.   Alexis Frock 06/08/2022, 2:34 PM

## 2022-06-10 LAB — URINE CULTURE: Culture: >=100000 — AB

## 2022-06-11 ENCOUNTER — Telehealth (HOSPITAL_BASED_OUTPATIENT_CLINIC_OR_DEPARTMENT_OTHER): Payer: Self-pay | Admitting: *Deleted

## 2022-06-11 NOTE — Telephone Encounter (Signed)
Post ED Visit - Positive Culture Follow-up  Culture report reviewed by antimicrobial stewardship pharmacist: [facility] []  Enzo Bi, Pharm.D. []  Celedonio Miyamoto, Pharm.D., BCPS AQ-ID []  Garvin Fila, Pharm.D., BCPS []  Georgina Pillion, [address].D., BCPS []  [place], [address].D., BCPS, AAHIVP []  Estella Husk, Pharm.D., BCPS, AAHIVP []  Lysle Pearl, PharmD, BCPS []  Phillips Climes, PharmD, BCPS []  Agapito Games, PharmD, BCPS []  Verlan Friends, PharmD []  Mervyn Gay, PharmD, BCPS []  Vinnie Level, PharmD  [facility] []  Len Childs, PharmD []  Greer Pickerel, PharmD []  Adalberto Cole, PharmD []  Perlie Gold, Rph []  Lonell Face) Jean Rosenthal, PharmD []  Earl Many, PharmD []  Junita Push, PharmD []  Dorna Leitz, PharmD []  Terrilee Files, PharmD []  Lynann Beaver, PharmD []  Keturah Barre, PharmD []  Loralee Pacas, PharmD []  Bernadene Person, PharmD   Positive urine culture Treated with Cefdinir, organism sensitive to the same and no further patient follow-up is required at this time.   Annitta Needs, PharmD [place] [place] 06/11/2022, 8:18 AM

## 2022-06-23 ENCOUNTER — Other Ambulatory Visit: Payer: Self-pay | Admitting: Urology

## 2022-07-31 ENCOUNTER — Emergency Department (HOSPITAL_COMMUNITY): Payer: Medicare Other

## 2022-07-31 ENCOUNTER — Observation Stay (HOSPITAL_COMMUNITY)
Admission: EM | Admit: 2022-07-31 | Discharge: 2022-08-01 | Disposition: A | Discharge status: Home / Self Care | Payer: Medicare Other | Attending: Internal Medicine | Admitting: Internal Medicine

## 2022-07-31 DIAGNOSIS — F17290 Nicotine dependence, other tobacco product, uncomplicated: Secondary | ICD-10-CM | POA: Insufficient documentation

## 2022-07-31 DIAGNOSIS — Z978 Presence of other specified devices: Secondary | ICD-10-CM

## 2022-07-31 DIAGNOSIS — Z1152 Encounter for screening for COVID-19: Secondary | ICD-10-CM | POA: Insufficient documentation

## 2022-07-31 DIAGNOSIS — R55 Syncope and collapse: Secondary | ICD-10-CM | POA: Diagnosis present

## 2022-07-31 DIAGNOSIS — Z8673 Personal history of transient ischemic attack (TIA), and cerebral infarction without residual deficits: Secondary | ICD-10-CM | POA: Insufficient documentation

## 2022-07-31 DIAGNOSIS — R2681 Unsteadiness on feet: Secondary | ICD-10-CM | POA: Insufficient documentation

## 2022-07-31 DIAGNOSIS — H905 Unspecified sensorineural hearing loss: Secondary | ICD-10-CM | POA: Diagnosis present

## 2022-07-31 DIAGNOSIS — R2689 Other abnormalities of gait and mobility: Secondary | ICD-10-CM | POA: Insufficient documentation

## 2022-07-31 DIAGNOSIS — Z7982 Long term (current) use of aspirin: Secondary | ICD-10-CM | POA: Insufficient documentation

## 2022-07-31 DIAGNOSIS — Z9181 History of falling: Secondary | ICD-10-CM | POA: Insufficient documentation

## 2022-07-31 DIAGNOSIS — I10 Essential (primary) hypertension: Secondary | ICD-10-CM | POA: Insufficient documentation

## 2022-07-31 DIAGNOSIS — E119 Type 2 diabetes mellitus without complications: Secondary | ICD-10-CM | POA: Insufficient documentation

## 2022-07-31 DIAGNOSIS — M6281 Muscle weakness (generalized): Secondary | ICD-10-CM | POA: Insufficient documentation

## 2022-07-31 DIAGNOSIS — F0390 Unspecified dementia without behavioral disturbance: Secondary | ICD-10-CM | POA: Diagnosis not present

## 2022-07-31 DIAGNOSIS — Z79899 Other long term (current) drug therapy: Secondary | ICD-10-CM | POA: Diagnosis not present

## 2022-07-31 DIAGNOSIS — R001 Bradycardia, unspecified: Secondary | ICD-10-CM | POA: Insufficient documentation

## 2022-07-31 DIAGNOSIS — N401 Enlarged prostate with lower urinary tract symptoms: Secondary | ICD-10-CM | POA: Diagnosis present

## 2022-07-31 HISTORY — DX: Acute posthemorrhagic anemia: D62

## 2022-07-31 LAB — URINALYSIS, ROUTINE W REFLEX MICROSCOPIC
Bacteria, UA: NONE SEEN
Bilirubin Urine: NEGATIVE
Glucose, UA: NEGATIVE mg/dL
Ketones, ur: NEGATIVE mg/dL
Nitrite: POSITIVE — AB
Protein, ur: 100 mg/dL — AB
Specific Gravity, Urine: 1.02 (ref 1.005–1.030)
pH: 5 (ref 5.0–8.0)

## 2022-07-31 LAB — COMPREHENSIVE METABOLIC PANEL
ALT: 21 U/L (ref 0–44)
AST: 22 U/L (ref 15–41)
Albumin: 3.3 g/dL — ABNORMAL LOW (ref 3.5–5.0)
Alkaline Phosphatase: 61 U/L (ref 38–126)
Anion gap: 10 (ref 5–15)
BUN: 14 mg/dL (ref 8–23)
CO2: 24 mmol/L (ref 22–32)
Calcium: 8.8 mg/dL — ABNORMAL LOW (ref 8.9–10.3)
Chloride: 106 mmol/L (ref 98–111)
Creatinine, Ser: 1.2 mg/dL (ref 0.61–1.24)
GFR, Estimated: >60 mL/min (ref >60)
Glucose, Bld: 98 mg/dL (ref 70–99)
Potassium: 3.9 mmol/L (ref 3.5–5.1)
Sodium: 140 mmol/L (ref 135–145)
Total Bilirubin: 0.7 mg/dL (ref 0.3–1.2)
Total Protein: 6.3 g/dL — ABNORMAL LOW (ref 6.5–8.1)

## 2022-07-31 LAB — CBC WITH DIFFERENTIAL/PLATELET
Abs Immature Granulocytes: 0.03 10*3/uL (ref 0.00–0.07)
Basophils Absolute: 0.1 10*3/uL (ref 0.0–0.1)
Basophils Relative: 1 %
Eosinophils Absolute: 0.2 10*3/uL (ref 0.0–0.5)
Eosinophils Relative: 3 %
HCT: 40.4 % (ref 39.0–52.0)
Hemoglobin: 12.8 g/dL — ABNORMAL LOW (ref 13.0–17.0)
Immature Granulocytes: 0 %
Lymphocytes Relative: 16 %
Lymphs Abs: 1.4 10*3/uL (ref 0.7–4.0)
MCH: 26.6 pg (ref 26.0–34.0)
MCHC: 31.7 g/dL (ref 30.0–36.0)
MCV: 84 fL (ref 80.0–100.0)
Monocytes Absolute: 0.6 10*3/uL (ref 0.1–1.0)
Monocytes Relative: 7 %
Neutro Abs: 6.5 10*3/uL (ref 1.7–7.7)
Neutrophils Relative %: 73 %
Platelets: 311 10*3/uL (ref 150–400)
RBC: 4.81 MIL/uL (ref 4.22–5.81)
RDW: 12.9 % (ref 11.5–15.5)
WBC: 8.8 10*3/uL (ref 4.0–10.5)
nRBC: 0 % (ref 0.0–0.2)

## 2022-07-31 LAB — LIPASE, BLOOD: Lipase: 30 U/L (ref 11–51)

## 2022-07-31 LAB — TROPONIN I (HIGH SENSITIVITY)
Troponin I (High Sensitivity): 8 ng/L (ref <18)
Troponin I (High Sensitivity): 8 ng/L (ref <18)

## 2022-07-31 LAB — TSH: TSH: 0.837 u[IU]/mL (ref 0.350–4.500)

## 2022-07-31 LAB — SARS CORONAVIRUS 2 BY RT PCR: SARS Coronavirus 2 by RT PCR: NEGATIVE

## 2022-07-31 MED ORDER — PRAVASTATIN SODIUM 10 MG PO TABS
20.0000 mg | ORAL_TABLET | Freq: Every day | ORAL | Status: DC
  Administered 2022-07-31 – 2022-08-01 (×2): 20 mg via ORAL
  Filled 2022-07-31 (×2): qty 2

## 2022-07-31 MED ORDER — FINASTERIDE 5 MG PO TABS
5.0000 mg | ORAL_TABLET | Freq: Every day | ORAL | Status: DC
  Administered 2022-07-31 – 2022-08-01 (×2): 5 mg via ORAL
  Filled 2022-07-31 (×2): qty 1

## 2022-07-31 MED ORDER — PANTOPRAZOLE SODIUM 40 MG PO TBEC
40.0000 mg | DELAYED_RELEASE_TABLET | Freq: Every day | ORAL | Status: DC
  Administered 2022-07-31 – 2022-08-01 (×2): 40 mg via ORAL
  Filled 2022-07-31 (×2): qty 1

## 2022-07-31 MED ORDER — RIVAROXABAN 10 MG PO TABS: 10.0000 mg | ORAL_TABLET | Freq: Every day | ORAL | Status: DC

## 2022-07-31 MED ORDER — SODIUM CHLORIDE 0.9 % IV BOLUS
500.0000 mL | Freq: Once | INTRAVENOUS | Status: AC
  Administered 2022-07-31: 500 mL via INTRAVENOUS

## 2022-07-31 MED ORDER — POLYETHYLENE GLYCOL 3350 17 G PO PACK: 17.0000 g | PACK | Freq: Every day | ORAL | Status: DC | PRN

## 2022-07-31 MED ORDER — ESCITALOPRAM OXALATE 10 MG PO TABS
20.0000 mg | ORAL_TABLET | Freq: Every day | ORAL | Status: DC
  Administered 2022-07-31 – 2022-08-01 (×2): 20 mg via ORAL
  Filled 2022-07-31 (×2): qty 2

## 2022-07-31 NOTE — Consult Note (Signed)
Cardiology Consultation   Patient ID: Matthew Knight MRN: [id number]; DOB: December 26, 1940  Admit date: 07/31/2022 Date of Consult: 07/31/2022  PCP:  Tracey Harries, MD   [facility] Providers Cardiologist:  None        Patient Profile:   Matthew Knight is a 82 y.o. 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  History of Present Illness:   Matthew Knight lives at [facility] and EMS was called due to concerns for sudden change in mental status. Upon EMS arrival he was hypotensive and bradycardic to the 40s. Was given atropine and brought to the ER. Patient does not recall the event. He has underlying dementia and when asked he first stated that he was at the doctors office and came here. Later on, he said he was at home and his wife brought him here. He does not recall EMS. He denis hx of syncope or presyncope.  He was seen by Cardiology in 2019 after an episode of orthostatic syncope. Had a normal echo and a negative cardiac monitor. He followed with cardiology afterwards in [facility]. He had a repeat echo and a stress test in 2022. Echo was reported normal and stress test with a small apical defect and an inferior diaphragmatic attenuation.   Past Medical History:  Diagnosis Date   Dementia (HCC)    Diabetes mellitus without complication (HCC)    Hearing loss    Hepatitis    Small vessel disease (HCC)    Stroke [facility])     Past Surgical History:  Procedure Laterality Date   APPENDECTOMY     BIOPSY  07/08/2021   Procedure: BIOPSY;  Surgeon: Charlott Rakes, MD;  Location: [facility] ENDOSCOPY;  Service: Gastroenterology;;   COLONOSCOPY WITH PROPOFOL N/A 03/24/2021   Procedure: COLONOSCOPY WITH PROPOFOL;  Surgeon: Napoleon Form, MD;  Location: [facility] ENDOSCOPY;  Service: Endoscopy;  Laterality: N/A;    COLONOSCOPY WITH PROPOFOL N/A 07/08/2021   Procedure: COLONOSCOPY WITH PROPOFOL;  Surgeon: Charlott Rakes, MD;  Location: [facility] ENDOSCOPY;  Service: Gastroenterology;  Laterality: N/A;   ESOPHAGOGASTRODUODENOSCOPY (EGD) WITH PROPOFOL N/A 03/24/2021   Procedure: ESOPHAGOGASTRODUODENOSCOPY (EGD) WITH PROPOFOL;  Surgeon: Napoleon Form, MD;  Location: [facility] ENDOSCOPY;  Service: Endoscopy;  Laterality: N/A;   GIVENS CAPSULE STUDY N/A 07/05/2021   Procedure: GIVENS CAPSULE STUDY;  Surgeon: Charlott Rakes, MD;  Location: [facility] ENDOSCOPY;  Service: Gastroenterology;  Laterality: N/A;   HEMOSTASIS CLIP PLACEMENT  03/24/2021   Procedure: HEMOSTASIS CLIP PLACEMENT;  Surgeon: Napoleon Form, MD;  Location: [facility] ENDOSCOPY;  Service: Endoscopy;;   HOT HEMOSTASIS N/A 03/24/2021   Procedure: HOT HEMOSTASIS (ARGON PLASMA COAGULATION/BICAP);  Surgeon: Napoleon Form, MD;  Location: [facility] ENDOSCOPY;  Service: Endoscopy;  Laterality: N/A;   HOT HEMOSTASIS N/A 07/08/2021   Procedure: HOT HEMOSTASIS (ARGON PLASMA COAGULATION/BICAP);  Surgeon: Charlott Rakes, MD;  Location: [facility] ENDOSCOPY;  Service: Gastroenterology;  Laterality: N/A;   POLYPECTOMY  03/24/2021   Procedure: POLYPECTOMY;  Surgeon: Napoleon Form, MD;  Location: [facility] ENDOSCOPY;  Service: Endoscopy;;   POLYPECTOMY  07/08/2021   Procedure: POLYPECTOMY;  Surgeon: Charlott Rakes, MD;  Location: [facility] ENDOSCOPY;  Service: Gastroenterology;;     Home Medications:  Prior to Admission medications   Medication Sig Start Date End Date Taking? Authorizing Provider  acetaminophen (TYLENOL) 325 MG tablet Take 2 tablets (  650 mg total) by mouth every 4 (four) hours as needed for mild pain, moderate pain, fever or headache. 03/11/21   Tyrone Nine, MD  ASPIRIN 81 PO Take 81 mg by mouth daily.    [provider]  Cholecalciferol (VITAMIN D3) 1000 units CAPS Take 1,000 Units by mouth daily.    [provider]  escitalopram (LEXAPRO) 20 MG tablet Take  20 mg by mouth daily. 07/11/17   [provider]  isosorbide mononitrate (IMDUR) 30 MG 24 hr tablet Take 30 mg by mouth daily. 04/28/20   [provider]  latanoprost (XALATAN) 0.005 % ophthalmic solution Place 1 drop into both eyes at bedtime.  07/27/17   [provider]  lisinopril (ZESTRIL) 10 MG tablet Take 10 mg by mouth daily. 02/01/20   [provider]  Multiple Vitamin (MULTIVITAMIN WITH MINERALS) TABS tablet Take 1 tablet by mouth daily. Patient not taking: Reported on 03/10/2022 07/11/21   Marguerita Merles Latif, DO  pantoprazole (PROTONIX) 40 MG tablet Take 1 tablet (40 mg total) by mouth daily. Patient taking differently: Take 40 mg by mouth daily before breakfast. 07/11/21   Marguerita Merles Latif, DO  polyethylene glycol (MIRALAX) 17 g packet Take 17 g by mouth daily as needed for moderate constipation. Patient not taking: Reported on 03/10/2022 07/04/21   Meredeth Ide, MD  pravastatin (PRAVACHOL) 20 MG tablet Take 20 mg by mouth daily. 04/28/20   [provider]  Prenatal Vit-Fe Fumarate-FA (PNV FOLIC ACID + IRON PO) Take 1 tablet by mouth daily with breakfast.    [provider]  RHOPRESSA 0.02 % SOLN Place 1 drop into both eyes daily. 11/15/18   [provider]  silodosin (RAPAFLO) 8 MG CAPS capsule Take 8 mg by mouth daily. 06/13/21   [provider]    Inpatient Medications: Scheduled Meds:  escitalopram  20 mg Oral Daily   finasteride  5 mg Oral Daily   pantoprazole  40 mg Oral Daily   pravastatin  20 mg Oral Daily   Continuous Infusions:  PRN Meds: polyethylene glycol  Allergies:    Allergies  Allergen Reactions   Aspirin Other (See Comments)    "BLEEDS OUT INTERNALLY"   Nsaids Other (See Comments)    NO ANTI-COAGULANTS, EITHER!!!! "BLEEDS OUT INTERNALLY" (ONLY Tylenol is tolerated)   Darunavir Other (See Comments)    Eruption of the skin   Rivastigmine Other (See Comments)    Brand name only for the patch-  "otherwise, he will have a violent reaction"   Heparin Other (See Comments)    Pt requests NO ANTICOAGULANTS.  Has varices with hx of bleeding.   Sulfa Antibiotics Other (See Comments)    States this "made him crazy"    Social History:   Social History   Socioeconomic History   Marital status: Widowed    Spouse name: Not on file   Number of children: Not on file   Years of education: Not on file   Highest education level: Not on file  Occupational History   Not on file  Tobacco Use   Smoking status: Every Day    Types: Pipe   Smokeless tobacco: Never  Vaping Use   Vaping Use: Never used  Substance and Sexual Activity   Alcohol use: No    Alcohol/week: 0.0 standard drinks of alcohol   Drug use: No   Sexual activity: Not on file  Other Topics Concern   Not on file  Social History Narrative  Not on file   Social Determinants of Health   Financial Resource Strain: Not on file  Food Insecurity: Not on file  Transportation Needs: Not on file  Physical Activity: Not on file  Stress: Not on file  Social Connections: Not on file  Intimate Partner Violence: Not on file    Family History:   Family History  Problem Relation Age of Onset   Diabetes Mother    Heart disease Father    Memory loss Paternal Grandfather      ROS:  Please see the history of present illness.  All other ROS reviewed and negative.     Physical Exam/Data:   Vitals:   07/31/22 1830 07/31/22 1845 07/31/22 1921 07/31/22 2021  BP: 106/69 (!) 123/54 (!) 141/65 121/80  Pulse: (!) 49 (!) 52 (!) 47 69  Resp: (!) 22 16 17 16   Temp:   98 F (36.7 C) 98 F (36.7 C)  TempSrc:   Oral Oral  SpO2: 97% 99% 96% 97%   No intake or output data in the 24 hours ending 07/31/22 2215    05/22/2022    1:07 PM 03/23/2022    3:46 PM 03/11/2022    5:44 PM  Last 3 Weights  Weight (lbs) 235 lb 14.3 oz 235 lb 14.3 oz 235 lb 14.3 oz  Weight (kg) 107 kg 107 kg 107 kg     There is no height or weight on file to  calculate BMI.  General:  Well nourished, well developed, in no acute distress HEENT: normal Neck: no JVD Vascular: No carotid bruits; Distal pulses 2+ bilaterally Cardiac:  normal S1, S2; RRR; no murmur  Lungs:  clear to auscultation bilaterally, no wheezing, rhonchi or rales  Abd: soft, nontender, no hepatomegaly  Ext: no edema Musculoskeletal:  No deformities, BUE and BLE strength normal and equal Skin: warm and dry  Neuro:  CNs 2-12 intact, no focal abnormalities noted Psych:  Normal affect   EKG:  The EKG was personally reviewed and demonstrates:  sinus bradycardia Telemetry:  Telemetry was personally reviewed and demonstrates:  sinus bradycardia  Laboratory Data:  High Sensitivity Troponin:   Recent Labs  Lab 07/31/22 1340 07/31/22 1545  TROPONINIHS 8 8     Chemistry Recent Labs  Lab 07/31/22 1340  NA 140  K 3.9  CL 106  CO2 24  GLUCOSE 98  BUN 14  CREATININE 1.20  CALCIUM 8.8*  GFRNONAA >60  ANIONGAP 10    Recent Labs  Lab 07/31/22 1340  PROT 6.3*  ALBUMIN 3.3*  AST 22  ALT 21  ALKPHOS 61  BILITOT 0.7   Lipids No results for input(s): "CHOL", "TRIG", "HDL", "LABVLDL", "LDLCALC", "CHOLHDL" in the last 168 hours.  Hematology Recent Labs  Lab 07/31/22 1340  WBC 8.8  RBC 4.81  HGB 12.8*  HCT 40.4  MCV 84.0  MCH 26.6  MCHC 31.7  RDW 12.9  PLT 311   Thyroid  Recent Labs  Lab 07/31/22 1340  TSH 0.837    BNPNo results for input(s): "BNP", "PROBNP" in the last 168 hours.  DDimer No results for input(s): "DDIMER" in the last 168 hours.   Radiology/Studies:  DG Chest Portable 1 View  Result Date: 07/31/2022 CLINICAL DATA:  syncope EXAM: PORTABLE CHEST - 1 VIEW COMPARISON:  03/10/2022 FINDINGS: Improved pulmonary inflation with decrease in left lower lung airspace disease. Partially calcified bilateral pleural plaques. Heart size and mediastinal contours are within normal limits. Aortic Atherosclerosis (ICD10-170.0). Chronic blunting of the  left  lateral costophrenic angle. Orthopedic hardware in the proximal right humerus as before. IMPRESSION: Improved pulmonary inflation. No acute findings. Electronically Signed   By: Corlis Leak M.D.   On: 07/31/2022 14:20     Assessment and Plan:   Matthew Knight is a 82 y.o. 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   # Sinus bradycardia - It is unclear weather he had symptomatic bradycardia or not. His baseline HR based on chart review ranges between 50s and 60s. The mechanism of bradycardia appears to be sinus and he has no over evidence of infranodal conduction disease.   Recommendations: - Agree with echocardiogram in the morning - Continue cardiac monitoring. If unrevealing, will send with a 30 day monitor - No indication for PPM at this time - Avoid AVN blockers - Orthostatic vitals  Risk Assessment/Risk Scores:                For questions or updates, please contact [facility] Please consult [email or web address] for contact info under    Signed, Regan Rakers, MD  07/31/2022 10:15 PM

## 2022-07-31 NOTE — ED Triage Notes (Signed)
Pt arrives via EMS from [facility] initially for stroke call. EMS reports that pt had syncopal episode and was assisted to the ground by staff and was lethargic thereafter. Pt initial HR 40's with BP 88/42 and was given 1 mg atropine with improvement HR into the 80's and BP 130/80. 500 mL NS bolus given. Pt alert to person and place during triage which is reportedly baseline.

## 2022-07-31 NOTE — H&P (Cosign Needed Addendum)
Date: 07/31/2022               Patient Name:  Matthew Knight MRN: [id number]  DOB: Aug 26, 1940 Age / Sex: 82 y.o., male   PCP: Tracey Harries, MD         Medical Service: Internal Medicine Teaching Service         Attending Physician: Dr. Mayford Knife, Dorene Ar, MD      First Contact: Dr. Rocky Morel, DO Pager [phone]    Second Contact: Dr. Sharrell Ku, MD Pager [phone]         After Hours (After 5p/  First Contact Pager: [phone]  weekends / holidays): Second Contact Pager: [phone]   SUBJECTIVE   Chief Complaint: Presyncope  History of Present Illness:  Matthew Knight is an 82 year old male with a past medical history of dementia, BPH, indwelling Foley, and prior CVA presenting after a near syncopal episode with heart rate in the 40s.  He lives at [facility] and they called EMS with concerns for stroke after sudden change in mental status.  They found him to be hypotensive and bradycardic and gave him 500 mL of IVF as well as 1 mg of atropine with a drastic improvement in mental status, blood pressure, and heart rate.  After that he seems to be at baseline.  In the room the patient does not have any complaints and does not remember the event.  At various points he thinks that he came from [place] [facility] but will agree when we prompt [facility].  He has no chest pain, shortness of breath, dizziness, abdominal pain, nausea, vomiting, lower extremity swelling, cough, fever.  ED Course: Presented hemodynamically stable with heart rate in the 70s and MAP in the 80s.  Repeat CT and CMP were largely unremarkable but his urinalysis showed evidence of UTI.  He had a pansensitive Staph epidermidis UTI on 06/08/2022 treated with 7 days of cefdinir.  EKG here without any significant heart block and chest x-ray without significant abnormality.  TSH 0.837 and CBG 98.  [facility] was paged for admission.  Past Medical History Dementia Hypertension BPH Prior  CVA Glaucoma Depression Hyperlipidemia GERD Bilateral common iliac artery aneurysms  Meds:  Medication list from EMR and corroborated with pharmacy fill history Aspirin 81 mg daily Vitamin D3 1000 units daily Escitalopram 20 mg daily Isosorbide mononitrate 30 mg daily Lisinopril 10 mg daily Pantoprazole 40 mg daily Pravastatin 20 mg daily Silodosin 8 mg daily Finasteride 5 mg daily  Past Surgical History Past Surgical History:  Procedure Laterality Date   APPENDECTOMY     BIOPSY  07/08/2021   Procedure: BIOPSY;  Surgeon: Charlott Rakes, MD;  Location: [facility] ENDOSCOPY;  Service: Gastroenterology;;   COLONOSCOPY WITH PROPOFOL N/A 03/24/2021   Procedure: COLONOSCOPY WITH PROPOFOL;  Surgeon: Napoleon Form, MD;  Location: [facility] ENDOSCOPY;  Service: Endoscopy;  Laterality: N/A;   COLONOSCOPY WITH PROPOFOL N/A 07/08/2021   Procedure: COLONOSCOPY WITH PROPOFOL;  Surgeon: Charlott Rakes, MD;  Location: [facility] ENDOSCOPY;  Service: Gastroenterology;  Laterality: N/A;   ESOPHAGOGASTRODUODENOSCOPY (EGD) WITH PROPOFOL N/A 03/24/2021   Procedure: ESOPHAGOGASTRODUODENOSCOPY (EGD) WITH PROPOFOL;  Surgeon: Napoleon Form, MD;  Location: [facility] ENDOSCOPY;  Service: Endoscopy;  Laterality: N/A;   GIVENS CAPSULE STUDY N/A 07/05/2021   Procedure: GIVENS CAPSULE STUDY;  Surgeon: Charlott Rakes, MD;  Location: [facility] ENDOSCOPY;  Service: Gastroenterology;  Laterality: N/A;   HEMOSTASIS CLIP PLACEMENT  03/24/2021   Procedure: HEMOSTASIS CLIP PLACEMENT;  Surgeon: Napoleon Form, MD;  Location: [facility] ENDOSCOPY;  Service: Endoscopy;;   HOT HEMOSTASIS N/A 03/24/2021   Procedure: HOT HEMOSTASIS (ARGON PLASMA COAGULATION/BICAP);  Surgeon: Napoleon Form, MD;  Location: [facility] ENDOSCOPY;  Service: Endoscopy;  Laterality: N/A;   HOT HEMOSTASIS N/A 07/08/2021   Procedure: HOT HEMOSTASIS (ARGON PLASMA COAGULATION/BICAP);  Surgeon: Charlott Rakes, MD;  Location: [facility] ENDOSCOPY;  Service: Gastroenterology;  Laterality:  N/A;   POLYPECTOMY  03/24/2021   Procedure: POLYPECTOMY;  Surgeon: Napoleon Form, MD;  Location: [facility] ENDOSCOPY;  Service: Endoscopy;;   POLYPECTOMY  07/08/2021   Procedure: POLYPECTOMY;  Surgeon: Charlott Rakes, MD;  Location: [facility] ENDOSCOPY;  Service: Gastroenterology;;    Social:  Lives at [facility].  Occupation: Support: Son, Harrold Donath, and Daughter, [name], who is mPOA. Level of Function: Seems to be dependent in IADLs but not all ADLs. PCP: Tracey Harries, MD Substances: Unable to assess but information in EMR shows tobacco use disorder and noted pipe smoking.  No alcohol or drug use.  Family History:  Family History  Problem Relation Age of Onset   Diabetes Mother    Heart disease Father    Memory loss Paternal Grandfather      Allergies: Allergies as of 07/31/2022 - Reviewed 07/31/2022  Allergen Reaction Noted   Aspirin Other (See Comments) 03/10/2022   Nsaids Other (See Comments) 03/10/2022   Darunavir Other (See Comments) 09/19/2017   Rivastigmine Other (See Comments) 07/01/2021   Heparin Other (See Comments) 03/24/2022   Sulfa antibiotics Other (See Comments) 04/23/2015    Review of Systems: A complete ROS was negative except as per HPI.   OBJECTIVE:   Physical Exam: Blood pressure 122/64, pulse (!) 50, temperature 97.7 F (36.5 C), resp. rate 14, SpO2 99 %.  Constitutional: Elderly male laying in bed appears comfortable but slightly confused. In no acute distress. HENT: Normocephalic, atraumatic,  Eyes: Sclera non-icteric, PERRL, EOM intact Cardio: Bradycardic with regular rhythm. No murmurs, rubs, or gallops. 2+ bilateral radial and dorsalis pedis  pulses. Pulm:Clear to auscultation bilaterally. Normal work of breathing on room air. Abdomen: Soft, non-tender, non-distended, positive bowel sounds. MSK: Trace lower extremity edema. Skin:Warm and dry. Neuro:Alert and oriented to person and place but not time. No focal deficit noted.  Very hard of  hearing which limited full neuroexam. Psych:Pleasant mood and affect.  Labs: CBC    Component Value Date/Time   WBC 8.8 07/31/2022 1340   RBC 4.81 07/31/2022 1340   HGB 12.8 (L) 07/31/2022 1340   HCT 40.4 07/31/2022 1340   PLT 311 07/31/2022 1340   MCV 84.0 07/31/2022 1340   MCH 26.6 07/31/2022 1340   MCHC 31.7 07/31/2022 1340   RDW 12.9 07/31/2022 1340   LYMPHSABS 1.4 07/31/2022 1340   MONOABS 0.6 07/31/2022 1340   EOSABS 0.2 07/31/2022 1340   BASOSABS 0.1 07/31/2022 1340     CMP     Component Value Date/Time   NA 140 07/31/2022 1340   K 3.9 07/31/2022 1340   CL 106 07/31/2022 1340   CO2 24 07/31/2022 1340   GLUCOSE 98 07/31/2022 1340   BUN 14 07/31/2022 1340   CREATININE 1.20 07/31/2022 1340   CALCIUM 8.8 (L) 07/31/2022 1340   PROT 6.3 (L) 07/31/2022 1340   ALBUMIN 3.3 (L) 07/31/2022 1340   AST 22 07/31/2022 1340   ALT 21 07/31/2022 1340   ALKPHOS 61 07/31/2022 1340   BILITOT 0.7 07/31/2022 1340   GFRNONAA >60 07/31/2022 1340   GFRAA >60 11/11/2017 0543    Imaging: DG Chest  Portable 1 View Result Date: 07/31/2022 IMPRESSION: Improved pulmonary inflation. No acute findings.  Electronically Signed   By: Corlis Leak M.D.   On: 07/31/2022 14:20     EKG: personally reviewed my interpretation is normal sinus rhythm. Consistent with prior EKG but no bradycardia.  ASSESSMENT & PLAN:   Assessment & Plan by Problem: Principal Problem:   Bradycardia   GILMAN CANTEY is a 82 y.o. male with pertinent PMH of dementia, BPH, indwelling Foley, and prior CVA who presented after presyncopal episode with bradycardia and hypotension and is admitted for further evaluation of bradycardia.  Presyncope Bradycardia Presented from [facility] after an acute change in mental status and EMS evaluation showed bradycardia with heart rate in the 40s and hypotension.  He responded to 500 mL bolus of IV fluids and 1 mg of atropine IV.  Has since been at baseline but his heart  rate has crept back into the 50s.  No conduction abnormalities on admission EKG.  No symptoms reported by patient although he cannot remember the event. Similar event in 2019 and was evaluated by cardiology.  Outpatient cardiac event monitor predominantly in sinus rhythm with no significant arrhythmias or bradycardia.  Echo at that time within normal limits and in 03/2021 with normal EF and moderate LVH with grade 1 diastolic dysfunction. Is on an alpha-blocker for his BPH but no other offending meds and no obvious metabolic causes.  No obvious conduction delays on EKG but his rate has decreased into the 50s and was in the 80s when the EKG was taken.  Could be sick sinus syndrome. - Repeat EKG consistent with prior EKG with rate of 51 but no heart block - Avoid AV nodal blocking agents and hold home alpha-blocker - Remain on telemetry - Consult cardiology - Repeat echocardiogram - Orthostatics  BPH Indwelling Foley Patient with chronic indwelling Foley for bladder outlet obstruction due to BPH with a planned transurethral water jet ablation of his prostate on 08/13/2022.  Foley in place on arrival to [facility].  Will continue Foley catheter for now.  Urinalysis with signs of infection but no symptoms and no other signs of infection.  He was treated for Staph epidermidis UTI last month with cefdinir for 7 days.  For now we will not treat for UTI. - Hold silodosin and continue finasteride 5 mg daily  Hypertension On lisinopril and Imdur outpatient.  Blood pressure was low when EMS saw him and is normotensive here.  Will hold BP meds tonight and add back as tolerated.  Depression Continue home escitalopram 25 mg daily.  HLD Continue home pravastatin 20 mg daily  GERD Continue pantoprazole 40 mg daily  Diet: Normal VTE: None allergy listed to heparin and reason is to avoid anticoagulants Code: Full has previously been a DNR, will confirm with medical POA  Dispo: Admit patient to Observation  with expected length of stay less than 2 midnights.  Signed: Rocky Morel, DO Internal Medicine Resident PGY-1  07/31/2022, 6:26 PM   Dr. Rocky Morel, DO Pager [phone]

## 2022-07-31 NOTE — ED Notes (Signed)
ED TO INPATIENT HANDOFF REPORT  ED Nurse Name and Phone #: Virgilio Belling Name/Age/Gender Matthew Knight 82 y.o. male Room/Bed: 031C/031C  Code Status   Code Status: Full Code  Home/SNF/Other Skilled nursing facility [facility] Patient oriented to: self and place Is this baseline? Yes   Triage Complete: Triage complete  Chief Complaint Bradycardia [R00.1]  Triage Note Pt arrives via EMS from [facility] initially for stroke call. EMS reports that pt had syncopal episode and was assisted to the ground by staff and was lethargic thereafter. Pt initial HR 40's with BP 88/42 and was given 1 mg atropine with improvement HR into the 80's and BP 130/80. 500 mL NS bolus given. Pt alert to person and place during triage which is reportedly baseline.    Allergies Allergies  Allergen Reactions   Aspirin Other (See Comments)    "BLEEDS OUT INTERNALLY"   Nsaids Other (See Comments)    NO ANTI-COAGULANTS, EITHER!!!! "BLEEDS OUT INTERNALLY" (ONLY Tylenol is tolerated)   Darunavir Other (See Comments)    Eruption of the skin   Rivastigmine Other (See Comments)    Brand name only for the patch- "otherwise, he will have a violent reaction"   Heparin Other (See Comments)    Pt requests NO ANTICOAGULANTS.  Has varices with hx of bleeding.   Sulfa Antibiotics Other (See Comments)    States this "made him crazy"    Level of Care/Admitting Diagnosis ED Disposition     ED Disposition  Admit   Condition  --   Comment  Hospital Area: [facility] [[id number]]  Level of Care: Telemetry Medical [104]  May place patient in observation at [facility] or [facility] if equivalent level of care is available:: Yes  Covid Evaluation: Asymptomatic - no recent exposure (last 10 days) testing not required  Diagnosis: Bradycardia [130865]  Admitting Physician: Miguel Aschoff [[id number]]  Attending Physician: Miguel Aschoff [[id number]]          B Medical/Surgery  History Past Medical History:  Diagnosis Date   Dementia (HCC)    Diabetes mellitus without complication (HCC)    Hearing loss    Hepatitis    Small vessel disease (HCC)    Stroke [facility])    Past Surgical History:  Procedure Laterality Date   APPENDECTOMY     BIOPSY  07/08/2021   Procedure: BIOPSY;  Surgeon: Charlott Rakes, MD;  Location: [facility] ENDOSCOPY;  Service: Gastroenterology;;   COLONOSCOPY WITH PROPOFOL N/A 03/24/2021   Procedure: COLONOSCOPY WITH PROPOFOL;  Surgeon: Napoleon Form, MD;  Location: [facility] ENDOSCOPY;  Service: Endoscopy;  Laterality: N/A;   COLONOSCOPY WITH PROPOFOL N/A 07/08/2021   Procedure: COLONOSCOPY WITH PROPOFOL;  Surgeon: Charlott Rakes, MD;  Location: [facility] ENDOSCOPY;  Service: Gastroenterology;  Laterality: N/A;   ESOPHAGOGASTRODUODENOSCOPY (EGD) WITH PROPOFOL N/A 03/24/2021   Procedure: ESOPHAGOGASTRODUODENOSCOPY (EGD) WITH PROPOFOL;  Surgeon: Napoleon Form, MD;  Location: [facility] ENDOSCOPY;  Service: Endoscopy;  Laterality: N/A;   GIVENS CAPSULE STUDY N/A 07/05/2021   Procedure: GIVENS CAPSULE STUDY;  Surgeon: Charlott Rakes, MD;  Location: [facility] ENDOSCOPY;  Service: Gastroenterology;  Laterality: N/A;   HEMOSTASIS CLIP PLACEMENT  03/24/2021   Procedure: HEMOSTASIS CLIP PLACEMENT;  Surgeon: Napoleon Form, MD;  Location: [facility] ENDOSCOPY;  Service: Endoscopy;;   HOT HEMOSTASIS N/A 03/24/2021   Procedure: HOT HEMOSTASIS (ARGON PLASMA COAGULATION/BICAP);  Surgeon: Napoleon Form, MD;  Location: [facility] ENDOSCOPY;  Service: Endoscopy;  Laterality: N/A;   HOT HEMOSTASIS  N/A 07/08/2021   Procedure: HOT HEMOSTASIS (ARGON PLASMA COAGULATION/BICAP);  Surgeon: Charlott Rakes, MD;  Location: [facility] ENDOSCOPY;  Service: Gastroenterology;  Laterality: N/A;   POLYPECTOMY  03/24/2021   Procedure: POLYPECTOMY;  Surgeon: Napoleon Form, MD;  Location: [facility] ENDOSCOPY;  Service: Endoscopy;;   POLYPECTOMY  07/08/2021   Procedure: POLYPECTOMY;  Surgeon: Charlott Rakes, MD;   Location: [facility] ENDOSCOPY;  Service: Gastroenterology;;     A IV Location/Drains/Wounds Patient Lines/Drains/Airways Status     Active Line/Drains/Airways     Name Placement date Placement time Site Days   Peripheral IV 07/31/22 20 G 1" Right Antecubital 07/31/22  1330  Antecubital  less than 1   Urethral Catheter 14 Fr. 03/10/22  --  --  143   Urethral Catheter Sheria Lang RN Coude 16 Fr. 03/23/22  1631  Coude  130   Urethral Catheter Krystal RN 16 Fr. 05/22/22  1419  --  70            Intake/Output Last 24 hours No intake or output data in the 24 hours ending 07/31/22 1854  Labs/Imaging Results for orders placed or performed during the hospital encounter of 07/31/22 (from the past 48 hour(s))  Comprehensive metabolic panel     Status: Abnormal   Collection Time: 07/31/22  1:40 PM  Result Value Ref Range   Sodium 140 135 - 145 mmol/L   Potassium 3.9 3.5 - 5.1 mmol/L   Chloride 106 98 - 111 mmol/L   CO2 24 22 - 32 mmol/L   Glucose, Bld 98 70 - 99 mg/dL    Comment: Glucose reference range applies only to samples taken after fasting for at least 8 hours.   BUN 14 8 - 23 mg/dL   Creatinine, Ser 1.61 0.61 - 1.24 mg/dL   Calcium 8.8 (L) 8.9 - 10.3 mg/dL   Total Protein 6.3 (L) 6.5 - 8.1 g/dL   Albumin 3.3 (L) 3.5 - 5.0 g/dL   AST 22 15 - 41 U/L   ALT 21 0 - 44 U/L   Alkaline Phosphatase 61 38 - 126 U/L   Total Bilirubin 0.7 0.3 - 1.2 mg/dL   GFR, Estimated >09 >60 mL/min    Comment: (NOTE) Calculated using the CKD-EPI Creatinine Equation (2021)    Anion gap 10 5 - 15    Comment: Performed at [facility], [address] [place], [place] [address]  Lipase, blood     Status: None   Collection Time: 07/31/22  1:40 PM  Result Value Ref Range   Lipase 30 11 - 51 U/L    Comment: Performed at [facility], [address], [place], [place] [address]  Troponin I (High Sensitivity)     Status: None   Collection Time: 07/31/22  1:40 PM  Result Value Ref Range    Troponin I (High Sensitivity) 8 <18 ng/L    Comment: (NOTE) Elevated high sensitivity troponin I (hsTnI) values and significant  changes across serial measurements may suggest ACS but many other  chronic and acute conditions are known to elevate hsTnI results.  Refer to the "Links" section for chest pain algorithms and additional  guidance. Performed at [facility], [address], [place], [place] [address]   CBC with Differential     Status: Abnormal   Collection Time: 07/31/22  1:40 PM  Result Value Ref Range   WBC 8.8 4.0 - 10.5 K/uL   RBC 4.81 4.22 - 5.81 MIL/uL   Hemoglobin 12.8 (  L) 13.0 - 17.0 g/dL   HCT 16.1 09.6 - 04.5 %   MCV 84.0 80.0 - 100.0 fL   MCH 26.6 26.0 - 34.0 pg   MCHC 31.7 30.0 - 36.0 g/dL   RDW 40.9 81.1 - 91.4 %   Platelets 311 150 - 400 K/uL   nRBC 0.0 0.0 - 0.2 %   Neutrophils Relative % 73 %   Neutro Abs 6.5 1.7 - 7.7 K/uL   Lymphocytes Relative 16 %   Lymphs Abs 1.4 0.7 - 4.0 K/uL   Monocytes Relative 7 %   Monocytes Absolute 0.6 0.1 - 1.0 K/uL   Eosinophils Relative 3 %   Eosinophils Absolute 0.2 0.0 - 0.5 K/uL   Basophils Relative 1 %   Basophils Absolute 0.1 0.0 - 0.1 K/uL   Immature Granulocytes 0 %   Abs Immature Granulocytes 0.03 0.00 - 0.07 K/uL    Comment: Performed at [facility], [address] [place], [place] [address]  TSH     Status: None   Collection Time: 07/31/22  1:40 PM  Result Value Ref Range   TSH 0.837 0.350 - 4.500 uIU/mL    Comment: Performed by a 3rd Generation assay with a functional sensitivity of <=0.01 uIU/mL. Performed at [facility], [address], [place], [place] [address]   Urinalysis, Routine w reflex microscopic -Urine, Clean Catch     Status: Abnormal   Collection Time: 07/31/22  1:40 PM  Result Value Ref Range   Color, Urine AMBER (A) YELLOW    Comment: BIOCHEMICALS MAY BE AFFECTED BY COLOR   APPearance CLOUDY (A) CLEAR   Specific Gravity, Urine 1.020 1.005 - 1.030   pH 5.0 5.0 -  8.0   Glucose, UA NEGATIVE NEGATIVE mg/dL   Hgb urine dipstick LARGE (A) NEGATIVE   Bilirubin Urine NEGATIVE NEGATIVE   Ketones, ur NEGATIVE NEGATIVE mg/dL   Protein, ur 865 (A) NEGATIVE mg/dL   Nitrite POSITIVE (A) NEGATIVE   Leukocytes,Ua LARGE (A) NEGATIVE   RBC / HPF 21-50 0 - 5 RBC/hpf   WBC, UA 21-50 0 - 5 WBC/hpf   Bacteria, UA NONE SEEN NONE SEEN   Squamous Epithelial / HPF 0-5 0 - 5 /HPF   WBC Clumps PRESENT    Mucus PRESENT     Comment: Performed at [facility], [address], [place], [place] [address]  Troponin I (High Sensitivity)     Status: None   Collection Time: 07/31/22  3:45 PM  Result Value Ref Range   Troponin I (High Sensitivity) 8 <18 ng/L    Comment: (NOTE) Elevated high sensitivity troponin I (hsTnI) values and significant  changes across serial measurements may suggest ACS but many other  chronic and acute conditions are known to elevate hsTnI results.  Refer to the "Links" section for chest pain algorithms and additional  guidance. Performed at [facility], [address], [place], [place] [address]    DG Chest Portable 1 View  Result Date: 07/31/2022 CLINICAL DATA:  syncope EXAM: PORTABLE CHEST - 1 VIEW COMPARISON:  03/10/2022 FINDINGS: Improved pulmonary inflation with decrease in left lower lung airspace disease. Partially calcified bilateral pleural plaques. Heart size and mediastinal contours are within normal limits. Aortic Atherosclerosis (ICD10-170.0). Chronic blunting of the left  lateral costophrenic angle. Orthopedic hardware in the proximal right humerus as before. IMPRESSION: Improved pulmonary inflation. No acute findings. Electronically Signed   By: Corlis Leak M.D.   On: 07/31/2022  14:20    Pending Labs Unresulted Labs (From admission, onward)     Start     Ordered   07/31/22 1536  Urine Culture  Once,   URGENT       Question:  Indication  Answer:  Altered mental status (if no other cause identified)   07/31/22 1535    07/31/22 1335  SARS Coronavirus 2 by RT PCR (hospital order, performed in [facility] hospital lab) *cepheid single result test* Anterior Nasal Swab  (SARS Coronavirus 2 by RT PCR (hospital order, performed in [facility] hospital lab) *cepheid single result test*)  Once,   URGENT        07/31/22 1335            Vitals/Pain Today's Vitals   07/31/22 1800 07/31/22 1815 07/31/22 1830 07/31/22 1845  BP: (!) 123/58 126/63 106/69 (!) 123/54  Pulse: 66 (!) 50 (!) 49 (!) 52  Resp: 18 16 (!) 22 16  Temp:      TempSrc:      SpO2: 99% 98% 97% 99%  PainSc:        Isolation Precautions Airborne and Contact precautions  Medications Medications  sodium chloride 0.9 % bolus 500 mL (0 mLs Intravenous Stopped 07/31/22 1559)    Mobility walks with device     Focused Assessments Cardiac Assessment Handoff:    Lab Results  Component Value Date   CKTOTAL 58 07/05/2021   TROPONINI <0.03 11/11/2017   No results found for: "DDIMER" Does the Patient currently have chest pain? No    R Recommendations: See Admitting Provider Note  Report given to:   Additional Notes:

## 2022-07-31 NOTE — ED Provider Notes (Signed)
Emergency Department Provider Note   I have reviewed the triage vital signs and the nursing notes.   HISTORY  Chief Complaint Bradycardia and Loss of Consciousness   HPI Matthew Knight is a 82 y.o. male past history of dementia, diabetes, prior stroke presents to the emergency department after being found presyncopal and less responsive.  Last normal was before bed yesterday.  EMS report they were called to the facility with concern for sudden change in mental status.  They arrived to find him slumped over.  He did not fall or hit his head.  He had poor color and appeared poorly perfused overall. No fever.  He was found to be hypotensive and bradycardic into the 40s.  They gave 500 mL of IV fluid along with 1 mg of atropine with prompt improvement in mental status, blood pressure, heart rate.  Patient does not recall the time of symptom onset.  He reports he is currently having some tightness in his chest. Does not recall the event today. After improved BP, staff report he is currently at his mental status baseline.   Level 5 caveat: Dementia   Past Medical History:  Diagnosis Date   Dementia (HCC)    Diabetes mellitus without complication (HCC)    Hearing loss    Hepatitis    Small vessel disease (HCC)    Stroke (HCC)     Review of Systems  Constitutional: No fever/chills Cardiovascular: Positive chest pain. Respiratory: Denies shortness of breath. Gastrointestinal: No abdominal pain.   Neurological: Negative for headaches.  ____________________________________________   PHYSICAL EXAM:  VITAL SIGNS: ED Triage Vitals  Enc Vitals Group     BP 07/31/22 1333 123/68     Pulse Rate 07/31/22 1333 82     Resp 07/31/22 1333 20     Temp 07/31/22 1333 97.7 F (36.5 C)     Temp src --      SpO2 07/31/22 1333 98 %   Constitutional: Alert but confused. Well appearing and in no acute distress. Eyes: Conjunctivae are normal. PERRL. Head: Atraumatic. Nose: No  congestion/rhinnorhea. Mouth/Throat: Mucous membranes are moist.  Neck: No stridor.   Cardiovascular: Normal rate, regular rhythm. Good peripheral circulation. Grossly normal heart sounds.   Respiratory: Normal respiratory effort.  No retractions. Lungs CTAB. Gastrointestinal: Soft and nontender. No distention.  Musculoskeletal: No lower extremity tenderness nor edema. No gross deformities of extremities. Neurologic:  Normal speech and language.    ____________________________________________   LABS (all labs ordered are listed, but only abnormal results are displayed)  Labs Reviewed  URINE CULTURE - Abnormal; Notable for the following components:      Result Value   Culture MULTIPLE SPECIES PRESENT, SUGGEST RECOLLECTION (*)    All other components within normal limits  COMPREHENSIVE METABOLIC PANEL - Abnormal; Notable for the following components:   Calcium 8.8 (*)    Total Protein 6.3 (*)    Albumin 3.3 (*)    All other components within normal limits  CBC WITH DIFFERENTIAL/PLATELET - Abnormal; Notable for the following components:   Hemoglobin 12.8 (*)    All other components within normal limits  URINALYSIS, ROUTINE W REFLEX MICROSCOPIC - Abnormal; Notable for the following components:   Color, Urine AMBER (*)    APPearance CLOUDY (*)    Hgb urine dipstick LARGE (*)    Protein, ur 100 (*)    Nitrite POSITIVE (*)    Leukocytes,Ua LARGE (*)    All other components within normal limits  RENAL  FUNCTION PANEL - Abnormal; Notable for the following components:   Calcium 8.6 (*)    Albumin 3.4 (*)    All other components within normal limits  CBC WITH DIFFERENTIAL/PLATELET - Abnormal; Notable for the following components:   WBC 10.6 (*)    All other components within normal limits  SARS CORONAVIRUS 2 BY RT PCR  LIPASE, BLOOD  TSH  MAGNESIUM  TROPONIN I (HIGH SENSITIVITY)  TROPONIN I (HIGH SENSITIVITY)   ____________________________________________  EKG   EKG  Interpretation  Date/Time:  Saturday Jul 31 2022 18:33:58 EDT Ventricular Rate:  51 PR Interval:  179 QRS Duration: 112 QT Interval:  476 QTC Calculation: 439 R Axis:   73 Text Interpretation: Sinus rhythm Borderline intraventricular conduction delay Confirmed by Nicanor Alcon, April ([id number]) on 08/01/2022 8:38:13 AM        ____________________________________________  RADIOLOGY  DG Chest Portable 1 View  Result Date: 07/31/2022 CLINICAL DATA:  syncope EXAM: PORTABLE CHEST - 1 VIEW COMPARISON:  03/10/2022 FINDINGS: Improved pulmonary inflation with decrease in left lower lung airspace disease. Partially calcified bilateral pleural plaques. Heart size and mediastinal contours are within normal limits. Aortic Atherosclerosis (ICD10-170.0). Chronic blunting of the left  lateral costophrenic angle. Orthopedic hardware in the proximal right humerus as before. IMPRESSION: Improved pulmonary inflation. No acute findings. Electronically Signed   By: Corlis Leak M.D.   On: 07/31/2022 14:20    ____________________________________________   PROCEDURES  Procedure(s) performed:   Procedures  None  ____________________________________________   INITIAL IMPRESSION / ASSESSMENT AND PLAN / ED COURSE  Pertinent labs & imaging results that were available during my care of the patient were reviewed by me and considered in my medical decision making (see chart for details).   This patient is Presenting for Evaluation of AMS, which does require a range of treatment options, and is a complaint that involves a high risk of morbidity and mortality.  The Differential Diagnoses includes but is not exclusive to acute coronary syndrome, aortic dissection, pulmonary embolism, cardiac tamponade, community-acquired pneumonia, pericarditis, musculoskeletal chest wall pain, etc.   Critical Interventions-    Medications  escitalopram (LEXAPRO) tablet 20 mg (20 mg Oral Given 08/01/22 1039)  pantoprazole  (PROTONIX) EC tablet 40 mg (40 mg Oral Given 08/01/22 1039)  polyethylene glycol (MIRALAX / GLYCOLAX) packet 17 g (has no administration in time range)  pravastatin (PRAVACHOL) tablet 20 mg (20 mg Oral Given 08/01/22 1039)  finasteride (PROSCAR) tablet 5 mg (5 mg Oral Given 08/01/22 1039)  Chlorhexidine Gluconate Cloth 2 % PADS 6 each (6 each Topical Given 08/01/22 1041)  sodium chloride 0.9 % bolus 500 mL (0 mLs Intravenous Stopped 07/31/22 1559)    Reassessment after intervention: HR and BP improved.    I did obtain Additional Historical Information from EMS.   Clinical Laboratory Tests Ordered, included nitrite past UA is nitrate positive with leukocytes but no bacteria seen.  Patient without leukocytosis or fever.  No change in mental status.  Plan to send this for culture rather than treating with antibiotics initially. CBC without severe anemia.   Radiologic Tests Ordered, included CXR. I independently interpreted the images and agree with radiology interpretation.   Cardiac Monitor Tracing which shows sinus rhythm.    Social Determinants of Health Risk patient is currently living at a SNF.   Consult complete with TRH. Plan for admit.   Medical Decision Making: Summary:  Patient presents emergency department for evaluation of near syncope with bradycardia and hypotension on scene.  This improved  with gentle IV fluid bolus and atropine.  In review of strips from EMS I do not see a clear high-grade block requiring emergent cardiology consultation or pacemaker.  Review of our medication list for the patient does not include rate reducing medicines.   Reevaluation with update and discussion with patient. Plan for admit.   Patient's presentation is most consistent with acute presentation with potential threat to life or bodily function.   Disposition: admission  ____________________________________________  FINAL CLINICAL IMPRESSION(S) / ED DIAGNOSES  Final diagnoses:  Near syncope   Bradycardia    Note:  This document was prepared using Dragon voice recognition software and may include unintentional dictation errors.  Alona Bene, MD, [facility]    Aishani Kalis, Arlyss Repress, MD 08/01/22 [phone]

## 2022-08-01 ENCOUNTER — Other Ambulatory Visit: Payer: Self-pay | Admitting: Physician Assistant

## 2022-08-01 ENCOUNTER — Encounter (HOSPITAL_COMMUNITY): Payer: Self-pay | Admitting: Internal Medicine

## 2022-08-01 ENCOUNTER — Observation Stay (HOSPITAL_BASED_OUTPATIENT_CLINIC_OR_DEPARTMENT_OTHER): Payer: Medicare Other

## 2022-08-01 DIAGNOSIS — R55 Syncope and collapse: Secondary | ICD-10-CM

## 2022-08-01 DIAGNOSIS — R001 Bradycardia, unspecified: Secondary | ICD-10-CM

## 2022-08-01 DIAGNOSIS — Z978 Presence of other specified devices: Secondary | ICD-10-CM

## 2022-08-01 LAB — ECHOCARDIOGRAM COMPLETE BUBBLE STUDY
Area-P 1/2: 2.8 cm2
MV M vel: 2.05 m/s
MV Peak grad: 16.8 mmHg
P 1/2 time: 407 msec
S' Lateral: 4.2 cm

## 2022-08-01 LAB — RENAL FUNCTION PANEL
Albumin: 3.4 g/dL — ABNORMAL LOW (ref 3.5–5.0)
Anion gap: 9 (ref 5–15)
BUN: 15 mg/dL (ref 8–23)
CO2: 23 mmol/L (ref 22–32)
Calcium: 8.6 mg/dL — ABNORMAL LOW (ref 8.9–10.3)
Chloride: 107 mmol/L (ref 98–111)
Creatinine, Ser: 1.1 mg/dL (ref 0.61–1.24)
GFR, Estimated: >60 mL/min (ref >60)
Glucose, Bld: 92 mg/dL (ref 70–99)
Phosphorus: 3.8 mg/dL (ref 2.5–4.6)
Potassium: 4 mmol/L (ref 3.5–5.1)
Sodium: 139 mmol/L (ref 135–145)

## 2022-08-01 LAB — CBC WITH DIFFERENTIAL/PLATELET
Abs Immature Granulocytes: 0.03 10*3/uL (ref 0.00–0.07)
Basophils Absolute: 0.1 10*3/uL (ref 0.0–0.1)
Basophils Relative: 1 %
Eosinophils Absolute: 0.3 10*3/uL (ref 0.0–0.5)
Eosinophils Relative: 2 %
HCT: 42.5 % (ref 39.0–52.0)
Hemoglobin: 13.6 g/dL (ref 13.0–17.0)
Immature Granulocytes: 0 %
Lymphocytes Relative: 24 %
Lymphs Abs: 2.5 10*3/uL (ref 0.7–4.0)
MCH: 27.1 pg (ref 26.0–34.0)
MCHC: 32 g/dL (ref 30.0–36.0)
MCV: 84.7 fL (ref 80.0–100.0)
Monocytes Absolute: 0.8 10*3/uL (ref 0.1–1.0)
Monocytes Relative: 8 %
Neutro Abs: 6.9 10*3/uL (ref 1.7–7.7)
Neutrophils Relative %: 65 %
Platelets: 296 10*3/uL (ref 150–400)
RBC: 5.02 MIL/uL (ref 4.22–5.81)
RDW: 13 % (ref 11.5–15.5)
WBC: 10.6 10*3/uL — ABNORMAL HIGH (ref 4.0–10.5)
nRBC: 0 % (ref 0.0–0.2)

## 2022-08-01 LAB — URINE CULTURE

## 2022-08-01 LAB — MAGNESIUM: Magnesium: 2.2 mg/dL (ref 1.7–2.4)

## 2022-08-01 MED ORDER — CHLORHEXIDINE GLUCONATE CLOTH 2 % EX PADS
6.0000 | MEDICATED_PAD | Freq: Every day | CUTANEOUS | Status: DC
  Administered 2022-08-01: 6 via TOPICAL

## 2022-08-01 MED ORDER — FINASTERIDE 5 MG PO TABS: 5.0000 mg | ORAL_TABLET | Freq: Every day | ORAL | 3 refills | Status: AC

## 2022-08-01 NOTE — TOC Initial Note (Signed)
Transition of Care [facility]) - Initial/Assessment Note    Patient Details  Name: Matthew Knight MRN: [id number] Date of Birth: 09/10/1940  Transition of Care [facility] CM/SW Contact:    Patrice Paradise, LCSW Phone Number: 08/01/2022, 12:43 PM  Clinical Narrative:                  CSW was alerted that pt will be medically ready for DC soon. CSW spoke with [name] and she confirmed that pt is in their independent level at [facility]. [name] is will to transport pt however would like to come when he is close to DC. CSW informed her that we can alerted her when he is closer to being ready for DC. CSW update MD and RN.  TOC team will continue to assist with discharge planning needs.          Patient Goals and CMS Choice            Expected Discharge Plan and Services                                              Prior Living Arrangements/Services                       Activities of Daily Living      Permission Sought/Granted                  Emotional Assessment              Admission diagnosis:  Bradycardia [R00.1] Near syncope [R55] Patient Active Problem List   Diagnosis Date Noted   Hypokalemia 03/23/2022   UTI (urinary tract infection) 03/23/2022   Obstructed, uropathy 03/23/2022   Community acquired pneumonia of left lower lobe of lung 03/10/2022   Acute respiratory failure with hypoxia (HCC) 03/10/2022   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   AKI (acute kidney injury) (HCC) 03/10/2022   Rectal bleeding 07/02/2021   GIB (gastrointestinal bleeding) 07/01/2021   BPH (benign prostatic hyperplasia) 07/01/2021   HLD (hyperlipidemia) 07/01/2021   Iron deficiency anemia due to chronic blood loss    Symptomatic anemia    GI bleed 03/19/2021   Leukocytosis 03/19/2021   Renal insufficiency 03/19/2021   Fall at home, initial encounter 03/19/2021   Heme positive stool    Acute blood loss anemia    Platelet  inhibition due to Plavix    Orthostatic hypotension 03/10/2021   Acute left-sided weakness 03/09/2021   Closed fracture distal radius and ulna, left, sequela 03/09/2021   History of TIA (transient ischemic attack) 03/09/2021   Abnormal ECG 04/28/2020   Snoring 04/28/2020   Anxiety 11/22/2018   Dementia without behavioral disturbance (HCC) 11/22/2018   Depression 11/22/2018   Exposure to Agent [facility] 11/22/2018   Small vessel disease, 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   Dizziness 10/24/2018   Benign essential HTN 11/11/2017   Bradycardia 11/11/2017   Near syncope 11/10/2017   Onychomycosis 04/19/2017   Memory loss 01/22/2017   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   Iliac artery aneurysm, 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 [place] [address] Phone: [phone] Fax: [phone]  [facility] [address] [place] - [address] [facility] [address] [place] [address] Phone: [phone] Fax: [phone]     Social Determinants of Health (SDOH) Social History: SDOH Screenings   Tobacco Use: High Risk (06/08/2022)   SDOH Interventions:     Readmission Risk Interventions   No data to display

## 2022-08-01 NOTE — Progress Notes (Signed)
D/C instructions reviewed and all questions answered at time. All hospital equipment removed and pt belongings returned. No other needs identified. Pt transported to front of hospital for discharge.

## 2022-08-01 NOTE — Evaluation (Signed)
Occupational Therapy Evaluation Patient Details Name: Matthew Knight MRN: [id number] DOB: [birth year] Today's Date: 08/01/2022   History of Present Illness 82 year old male presenting 07/31/22 after a near syncopal episode with heart rate in the 40s.  He lives at [facility]  PMH dementia, HTN, BPH, indwelling Foley, DM, hearing loss, Bilateral common iliac artery aneurysms, and prior CVA   Clinical Impression   PTA, pt at independent living with caregivers coming in three times per day for ~30 mins; daughter reports he sleeps much of the day. Upon eval, pt  performing UB ADL with set up and LB ADL with up to mod A. Pt eating in bed with poor success on arrival; coming EOB on command and provided more efficient set-up able to self feed. Pt performing functional mobility to door with HHA and Min A for balance. Recommending continued OT services in natural setting to optimize safety in the home setting.      Recommendations for follow up therapy are one component of a multi-disciplinary discharge planning process, led by the attending physician.  Recommendations may be updated based on patient status, additional functional criteria and insurance authorization.   Assistance Recommended at Discharge Intermittent Supervision/Assistance  Patient can return home with the following A little help with walking and/or transfers;A little help with bathing/dressing/bathroom;Help with stairs or ramp for entrance;Assist for transportation;Assistance with cooking/housework;Assistance with feeding;Direct supervision/assist for medications management;Direct supervision/assist for financial management    Functional Status Assessment  Patient has had a recent decline in their functional status and demonstrates the ability to make significant improvements in function in a reasonable and predictable amount of time.  Equipment Recommendations  None recommended by OT (Daughter reports pt has all needed  equipment)    Recommendations for Other Services       Precautions / Restrictions Precautions Precautions: Fall Precaution Comments: denies falls/falling      Mobility Bed Mobility Overal bed mobility: Modified Independent             General bed mobility comments: incr time and effort with use of rail to get to EOB; no physical assist needed    Transfers Overall transfer level: Needs assistance Equipment used: 1 person hand held assist, None Transfers: Sit to/from Stand Sit to Stand: Min assist           General transfer comment: reaching out for support so providing HHA. Requiring min A for rise and steadying with all steps      Balance Overall balance assessment: Needs assistance Sitting-balance support: No upper extremity supported, Feet supported Sitting balance-Leahy Scale: Good     Standing balance support: No upper extremity supported Standing balance-Leahy Scale: Fair                             ADL either performed or assessed with clinical judgement   ADL Overall ADL's : Needs assistance/impaired Eating/Feeding: Set up;Sitting Eating/Feeding Details (indicate cue type and reason): Max difficulty eating on arrival. Coming to EOB from supine and providing ergonomic set up and pt able to self feed. Assist for cutitng food. Grooming: Set up;Sitting;Wash/dry face   Upper Body Bathing: Set up;Sitting   Lower Body Bathing: Moderate assistance;Sit to/from stand   Upper Body Dressing : Set up;Sitting   Lower Body Dressing: Moderate assistance;Sit to/from stand   Toilet Transfer: Minimal assistance           Functional mobility during ADLs: Minimal assistance  Vision Baseline Vision/History: 1 Wears glasses Ability to See in Adequate Light: 0 Adequate Patient Visual Report: No change from baseline Additional Comments: decr visual awareness/attention; likely some narrowing of peripheral fields     Perception     Praxis       Pertinent Vitals/Pain Pain Assessment Pain Assessment: Faces     Hand Dominance Right   Extremity/Trunk Assessment Upper Extremity Assessment Upper Extremity Assessment: Overall WFL for tasks assessed   Lower Extremity Assessment Lower Extremity Assessment: Overall WFL for tasks assessed   Cervical / Trunk Assessment Cervical / Trunk Assessment: Normal   Communication Communication Communication: HOH   Cognition Arousal/Alertness: Awake/alert Behavior During Therapy: WFL for tasks assessed/performed Overall Cognitive Status: No family/caregiver present to determine baseline cognitive functioning                                 General Comments: h/o dementia and very HOH. Inconsistent report regarding living situation. Believes it is 1974     General Comments  HR up to 119 max    Exercises     Shoulder Instructions      Home Living Family/patient expects to be discharged to:: Private residence Living Arrangements: Alone Available Help at Discharge: Family;Available PRN/intermittently (local daughter) Type of Home: Independent living facility                       Home Equipment: Grab bars - toilet;Grab bars - tub/shower;Rolling Walker (2 wheels)   Additional Comments: Pt from [facility]      Prior Functioning/Environment Prior Level of Function : Patient poor historian/Family not available             Mobility Comments: Per daughter, he has a RW and has been using it somewhat more consistently since he's had a foley catheter (he hands it on the walker). He doesn't remember 100% of the time, so at times he walks without a device; ADLs Comments: per dtr, he has aides (paid for by [place]) that come to his apartment twice per day and assist pt (information        OT Problem List: Decreased strength;Impaired balance (sitting and/or standing);Decreased activity tolerance;Decreased knowledge of use of DME or AE;Decreased  cognition;Decreased safety awareness      OT Treatment/Interventions: Self-care/ADL training;Therapeutic exercise;DME and/or AE instruction;Patient/family education;Balance training;Therapeutic activities;Cognitive remediation/compensation    OT Goals(Current goals can be found in the care plan section) Acute Rehab OT Goals Patient Stated Goal: eat OT Goal Formulation: With patient Time For Goal Achievement: 08/15/22 Potential to Achieve Goals: Good  OT Frequency: Min 2X/week    Co-evaluation              AM-PAC OT "6 Clicks" Daily Activity     Outcome Measure Help from another person eating meals?: A Little Help from another person taking care of personal grooming?: A Little Help from another person toileting, which includes using toliet, bedpan, or urinal?: A Little Help from another person bathing (including washing, rinsing, drying)?: A Little Help from another person to put on and taking off regular upper body clothing?: A Little Help from another person to put on and taking off regular lower body clothing?: A Lot 6 Click Score: 17   End of Session Equipment Utilized During Treatment: Gait belt Nurse Communication: Mobility status  Activity Tolerance: Patient tolerated treatment well Patient left: in bed;with call bell/phone within reach  OT Visit  Diagnosis: Unsteadiness on feet (R26.81);Muscle weakness (generalized) (M62.81);Other abnormalities of gait and mobility (R26.89);Other symptoms and signs involving cognitive function;History of falling (Z91.81)                Time: 1610-9604 OT Time Calculation (min): 27 min Charges:  OT General Charges $OT Visit: 1 Visit OT Evaluation $OT Eval Moderate Complexity: 1 Mod OT Treatments $Self Care/Home Management : 8-22 mins  Matthew Knight, OTR/L [facility] Acute [facility] Office: [phone]   Myrla Halsted 08/01/2022, 5:03 PM

## 2022-08-01 NOTE — Discharge Summary (Addendum)
Internal Medicine Attending:  I evaluated Matthew Knight together on rounds with Dr. Kirke Corin and agree with plan as documented.  Silodosin was stopped as a precaution considering possible associated orthostasis until he can be evaluated by his doctors to know him best.  There should be no effect on heart rate with this medication.  Continue to avoid AV nodal blockers and acetylcholinesterase inhibitors. Charissa Bash, MD   Name: Matthew Knight MRN: [id number] DOB: 03-26-1940 82 y.o. PCP: Tracey Harries, MD  Date of Admission: 07/31/2022  1:25 PM Date of Discharge: 08/01/2022 Attending Physician: Dr. Mayford Knife  Discharge Diagnosis: Principal Problem:   Near syncope Active Problems:   Sinus bradycardia   Sensorineural hearing loss, unilateral   Dementia without behavioral disturbance (HCC)   BPH with urinary obstruction   Chronic indwelling Foley catheter   Discharge Medications: Allergies as of 08/01/2022       Reactions   Aspirin Other (See Comments)   "BLEEDS OUT INTERNALLY"   Nsaids Other (See Comments)   NO ANTI-COAGULANTS, EITHER!!!! "BLEEDS OUT INTERNALLY" (ONLY Tylenol is tolerated)   Darunavir Other (See Comments)   Eruption of the skin   Rivastigmine Other (See Comments)   Brand name only for the patch- "otherwise, he will have a violent reaction"   Heparin Other (See Comments)   Pt requests NO ANTICOAGULANTS.  Has varices with hx of bleeding.   Sulfa Antibiotics Other (See Comments)   States this "made him crazy"        Medication List     STOP taking these medications    ASPIRIN 81 PO   CertaVite/Antioxidants Tabs   silodosin 8 MG Caps capsule Commonly known as: RAPAFLO       TAKE these medications    acetaminophen 325 MG tablet Commonly known as: TYLENOL Take 2 tablets (650 mg total) by mouth every 4 (four) hours as needed for mild pain, moderate pain, fever or headache.   escitalopram 20 MG tablet Commonly known as:  LEXAPRO Take 20 mg by mouth daily.   finasteride 5 MG tablet Commonly known as: PROSCAR Take 1 tablet (5 mg total) by mouth daily. Start taking on: Aug 02, 2022   isosorbide mononitrate 30 MG 24 hr tablet Commonly known as: IMDUR Take 30 mg by mouth daily.   latanoprost 0.005 % ophthalmic solution Commonly known as: XALATAN Place 1 drop into both eyes at bedtime.   lisinopril 10 MG tablet Commonly known as: ZESTRIL Take 10 mg by mouth daily.   pantoprazole 40 MG tablet Commonly known as: PROTONIX Take 1 tablet (40 mg total) by mouth daily. What changed: when to take this   PNV FOLIC ACID + IRON PO Take 1 tablet by mouth daily with breakfast.   polyethylene glycol 17 g packet Commonly known as: MiraLax Take 17 g by mouth daily as needed for moderate constipation.   pravastatin 20 MG tablet Commonly known as: PRAVACHOL Take 20 mg by mouth daily.   Rhopressa 0.02 % Soln Generic drug: Netarsudil Dimesylate Place 1 drop into both eyes daily.   Vitamin D3 1000 units Caps Take 1,000 Units by mouth daily.        Disposition and follow-up:   Matthew Knight was discharged from [facility] in Stable condition.  At the hospital follow up visit please address:  1.  Follow-up:  a.  Sinus bradycardia: SBP remained in the 50s to 60s during hospitalization. No high-grade blockade on EKG.  Echo showed  moderate aortic regurgitation but normal systolic function.  A 30-day outpatient monitor arranged by cardiology. Silodosin was discontinued on discharge.    b. Asymptomatic bacteruria: Urinalysis with pyuria and microhematuria however patient was not treated for this since it is likely colonized and patient was asymptomatic.   c.  BPH, indwelling Foley: His Foley was not changed during his short hospital stay. Pending transurethral prostate ablation on 6/7   2.  Labs / imaging needed at time of follow-up: N/A  3.  Pending labs/ test needing follow-up:  N/A  Follow-up Appointments: Daughter will set up an appointment with patient's PCP  Hospital Course by problem list: Matthew Knight is a 82 y.o. male with pertinent PMH of dementia, BPH, indwelling Foley, and prior CVA who presented after presyncopal episode with bradycardia and hypotension and is admitted for further evaluation of bradycardia. On arrival, heart rate was in the 50s to 60s and remained stable throughout his short hospital stay. An EKG shows sinus bradycardia without high-grade conduction abnormalities. Cardiology was consulted on admission and an echocardiogram was recommended which showed EF 60-5%, moderately dilated LA, and moderate aortic regurgitation but no other abnormalities. Orthostatic vitals were negative and his BP remained elevated with SBP in the 140s.  Urinalysis notable for microhematuria and pyuria though culture with multiple species and patient remained asymptomatic; no antibiotic indicated.  PT evaluated patient and recommended home health.  Cardiology recommended outpatient 30-day monitoring and follow-up.  Patient's silodosin was discontinued and he was discharged home to follow-up with PCP.   Subjective: Patient evaluated at bedside laying comfortably in bed.  He has difficulty hearing due to not having his hearing aid. Reports feeling well. He denies any dizziness, chest pain or shortness of breath.  He is unsure why he is in the hospital and does not remember the presyncope episode.  He is agreeable to continue speaking with his family.  Discharge Vitals:   BP (!) 146/62 (BP Location: Right Arm)   Pulse (!) 50   Temp 97.9 F (36.6 C) (Oral)   Resp 16   SpO2 97%  Discharge exam: General: Pleasant, well-appearing elderly man in bed. No acute distress. HEENT: Moderate hearing loss, worse in left ear. CV: Bradycardic. Regular rhythm. No murmurs, rubs, or gallops. No LE edema Pulmonary: Lungs CTAB. Normal effort. No wheezing or rales. Abdominal: Soft,  nontender, nondistended. Normal bowel sounds. Extremities: Radial and DP pulses 2+ and symmetric. Normal ROM. Skin: Warm and dry. No obvious rash or lesions. Neuro: Alert and oriented.  Moving all extremities.   Pertinent Labs, Studies, and Procedures:     Latest Ref Rng & Units 08/01/2022    2:33 AM 07/31/2022    1:40 PM 03/24/2022    6:35 AM  CBC  WBC 4.0 - 10.5 K/uL 10.6  8.8  13.3   Hemoglobin 13.0 - 17.0 g/dL 16.1  09.6  04.5   Hematocrit 39.0 - 52.0 % 42.5  40.4  34.9   Platelets 150 - 400 K/uL 296  311  285        Latest Ref Rng & Units 08/01/2022    2:33 AM 07/31/2022    1:40 PM 03/25/2022    1:20 PM  CMP  Glucose 70 - 99 mg/dL 92  98    BUN 8 - 23 mg/dL 15  14    Creatinine 4.09 - 1.24 mg/dL 8.11  9.14    Sodium 782 - 145 mmol/L 139  140    Potassium 3.5 - 5.1 mmol/L  4.0  3.9  4.5   Chloride 98 - 111 mmol/L 107  106    CO2 22 - 32 mmol/L 23  24    Calcium 8.9 - 10.3 mg/dL 8.6  8.8    Total Protein 6.5 - 8.1 g/dL  6.3    Total Bilirubin 0.3 - 1.2 mg/dL  0.7    Alkaline Phos 38 - 126 U/L  61    AST 15 - 41 U/L  22    ALT 0 - 44 U/L  21      ECHOCARDIOGRAM COMPLETE BUBBLE STUDY  Result Date: 08/01/2022    ECHOCARDIOGRAM REPORT   Patient Name:   Matthew Knight Date of Exam: 08/01/2022 Medical Rec #:  [id number]             Height:       72.0 in Accession #:    [id number]            Weight:       235.9 lb Date of Birth:  02-Aug-1940              BSA:          2.285 m Patient Age:    82 years              BP:           167/71 mmHg Patient Gender: M                     HR:           48 bpm. Exam Location:  Inpatient Procedure: 2D Echo, Color Doppler, Cardiac Doppler and Saline Contrast Bubble            Study Indications:    Syncope  History:        Patient has prior history of Echocardiogram examinations, most                 recent 03/09/2021. Stroke, Arrythmias:Bradycardia,                 Signs/Symptoms:Altered Mental Status, Syncope and Dementia; Risk                  Factors:Diabetes.  Sonographer:    Milda Smart Referring Phys: Rocky Morel  Sonographer Comments: Image acquisition challenging due to patient body habitus and Image acquisition challenging due to respiratory motion. IMPRESSIONS  1. Left ventricular ejection fraction, by estimation, is 60 to 65%. The left ventricle has normal function. The left ventricle has no regional wall motion abnormalities. Left ventricular diastolic parameters were normal.  2. Right ventricular systolic function is normal. The right ventricular size is normal.  3. Left atrial size was moderately dilated.  4. The mitral valve is abnormal. Trivial mitral valve regurgitation. No evidence of mitral stenosis.  5. The aortic valve is tricuspid. There is mild calcification of the aortic valve. There is mild thickening of the aortic valve. Aortic valve regurgitation is moderate. Aortic valve sclerosis is present, with no evidence of aortic valve stenosis.  6. The inferior vena cava is normal in size with greater than 50% respiratory variability, suggesting right atrial pressure of 3 mmHg.  7. Agitated saline contrast bubble study was negative, with no evidence of any interatrial shunt. FINDINGS  Left Ventricle: Left ventricular ejection fraction, by estimation, is 60 to 65%. The left ventricle has normal function. The left ventricle has no regional wall motion abnormalities. The left ventricular internal cavity size was normal  in size. There is  no left ventricular hypertrophy. Left ventricular diastolic parameters were normal. Right Ventricle: The right ventricular size is normal. No increase in right ventricular wall thickness. Right ventricular systolic function is normal. Left Atrium: Left atrial size was moderately dilated. Right Atrium: Right atrial size was normal in size. Pericardium: There is no evidence of pericardial effusion. Mitral Valve: The mitral valve is abnormal. There is mild thickening of the mitral valve leaflet(s).  Trivial mitral valve regurgitation. No evidence of mitral valve stenosis. Tricuspid Valve: The tricuspid valve is normal in structure. Tricuspid valve regurgitation is not demonstrated. No evidence of tricuspid stenosis. Aortic Valve: The aortic valve is tricuspid. There is mild calcification of the aortic valve. There is mild thickening of the aortic valve. Aortic valve regurgitation is moderate. Aortic regurgitation PHT measures 407 msec. Aortic valve sclerosis is present, with no evidence of aortic valve stenosis. Pulmonic Valve: The pulmonic valve was normal in structure. Pulmonic valve regurgitation is not visualized. No evidence of pulmonic stenosis. Aorta: The aortic root is normal in size and structure. Venous: The inferior vena cava is normal in size with greater than 50% respiratory variability, suggesting right atrial pressure of 3 mmHg. IAS/Shunts: No atrial level shunt detected by color flow Doppler. Agitated saline contrast was given intravenously to evaluate for intracardiac shunting. Agitated saline contrast bubble study was negative, with no evidence of any interatrial shunt.  LEFT VENTRICLE PLAX 2D LVIDd:         5.80 cm   Diastology LVIDs:         4.20 cm   LV e' medial:    4.90 cm/s LV PW:         1.10 cm   LV E/e' medial:  15.8 LV IVS:        0.90 cm   LV e' lateral:   8.65 cm/s LVOT diam:     2.20 cm   LV E/e' lateral: 8.9 LV SV:         111 LV SV Index:   48 LVOT Area:     3.80 cm  RIGHT VENTRICLE RV S prime:     14.00 cm/s TAPSE (M-mode): 2.2 cm LEFT ATRIUM             Index        RIGHT ATRIUM           Index LA diam:        4.40 cm 1.93 cm/m   RA Area:     13.70 cm LA Vol (A2C):   23.8 ml 10.44 ml/m  RA Volume:   32.60 ml  14.26 ml/m LA Vol (A4C):   40.6 ml 17.77 ml/m LA Biplane Vol: 33.2 ml 14.53 ml/m  AORTIC VALVE LVOT Vmax:   121.00 cm/s LVOT Vmean:  85.400 cm/s LVOT VTI:    0.291 m AI PHT:      407 msec  AORTA Ao Root diam: 3.20 cm Ao Asc diam:  3.30 cm MITRAL VALVE MV Area  (PHT): 2.80 cm    SHUNTS MV Decel Time: 271 msec    Systemic VTI:  0.29 m MR Peak grad: 16.8 mmHg    Systemic Diam: 2.20 cm MR Vmax:      205.00 cm/s MV E velocity: 77.20 cm/s MV A velocity: 73.10 cm/s MV E/A ratio:  1.06 Charlton Haws MD Electronically signed by Charlton Haws MD Signature Date/Time: 08/01/2022/12:51:25 PM    Final    DG Chest Portable 1 View  Result Date:  07/31/2022 CLINICAL DATA:  syncope EXAM: PORTABLE CHEST - 1 VIEW COMPARISON:  03/10/2022 FINDINGS: Improved pulmonary inflation with decrease in left lower lung airspace disease. Partially calcified bilateral pleural plaques. Heart size and mediastinal contours are within normal limits. Aortic Atherosclerosis (ICD10-170.0). Chronic blunting of the left  lateral costophrenic angle. Orthopedic hardware in the proximal right humerus as before. IMPRESSION: Improved pulmonary inflation. No acute findings. Electronically Signed   By: Corlis Leak M.D.   On: 07/31/2022 14:20     Discharge Instructions: Discharge Instructions     Diet - low sodium heart healthy   Complete by: As directed    Increase activity slowly   Complete by: As directed          Discharge Instructions      Matthew Knight,  It was a pleasure taking care of you at [facility]. You were admitted for presyncope and bradycardia. We are discharging you home now that you are doing better and your heart rate has stabilized. Please follow the following instructions.  1) Discontinue silodosin until follow-up with your PCP 2) Follow-up with cardiology for a 30-day monitor  Take care,  Dr. Sharrell Ku, MD, MPH     Signed: Steffanie Rainwater, MD 08/01/2022, 5:13 PM   Pager: [phone]

## 2022-08-01 NOTE — Progress Notes (Signed)
Cardiologist:  Hochrein  Subjective:  Denies SSCP, palpitations or Dyspnea Pain getting blood draw from left hand   Objective:  Vitals:   07/31/22 1921 07/31/22 2021 07/31/22 2330 08/01/22 0730  BP: (!) 141/65 121/80 (!) 167/71   Pulse: (!) 47 69    Resp: 17 16 15    Temp: 98 F (36.7 C) 98 F (36.7 C) 98 F (36.7 C)   TempSrc: Oral Oral Oral   SpO2: 96% 97% 99% 96%    Intake/Output from previous day: No intake or output data in the 24 hours ending 08/01/22 0816  Physical Exam: Affect appropriate Healthy:  appears stated age HEENT: normal Neck supple with no adenopathy JVP normal no bruits no thyromegaly Lungs clear with no wheezing and good diaphragmatic motion Heart:  S1/S2 SEM murmur, no rub, gallop or click PMI normal Abdomen: benighn, BS positve, no tenderness, no AAA no bruit.  No HSM or HJR Distal pulses intact with no bruits No edema Neuro non-focal Dementia Skin warm and dry No muscular weakness   Lab Results: Basic Metabolic Panel: Recent Labs    07/31/22 1340 08/01/22 0233  NA 140 139  K 3.9 4.0  CL 106 107  CO2 24 23  GLUCOSE 98 92  BUN 14 15  CREATININE 1.20 1.10  CALCIUM 8.8* 8.6*  PHOS  --  3.8   Liver Function Tests: Recent Labs    07/31/22 1340 08/01/22 0233  AST 22  --   ALT 21  --   ALKPHOS 61  --   BILITOT 0.7  --   PROT 6.3*  --   ALBUMIN 3.3* 3.4*   Recent Labs    07/31/22 1340  LIPASE 30   CBC: Recent Labs    07/31/22 1340 08/01/22 0233  WBC 8.8 10.6*  NEUTROABS 6.5 6.9  HGB 12.8* 13.6  HCT 40.4 42.5  MCV 84.0 84.7  PLT 311 296   Cardiac Enzymes: No results for input(s): "CKTOTAL", "CKMB", "CKMBINDEX", "TROPONINI" in the last 72 hours. BNP: Invalid input(s): "POCBNP" D-Dimer: No results for input(s): "DDIMER" in the last 72 hours. Hemoglobin A1C: No results for input(s): "HGBA1C" in the last 72 hours. Fasting Lipid Panel: No results for input(s): "CHOL", "HDL", "LDLCALC", "TRIG", "CHOLHDL",  "LDLDIRECT" in the last 72 hours. Thyroid Function Tests: Recent Labs    07/31/22 1340  TSH 0.837   Anemia Panel: No results for input(s): "VITAMINB12", "FOLATE", "FERRITIN", "TIBC", "IRON", "RETICCTPCT" in the last 72 hours.  Imaging: DG Chest Portable 1 View  Result Date: 07/31/2022 CLINICAL DATA:  syncope EXAM: PORTABLE CHEST - 1 VIEW COMPARISON:  03/10/2022 FINDINGS: Improved pulmonary inflation with decrease in left lower lung airspace disease. Partially calcified bilateral pleural plaques. Heart size and mediastinal contours are within normal limits. Aortic Atherosclerosis (ICD10-170.0). Chronic blunting of the left  lateral costophrenic angle. Orthopedic hardware in the proximal right humerus as before. IMPRESSION: Improved pulmonary inflation. No acute findings. Electronically Signed   By: Corlis Leak M.D.   On: 07/31/2022 14:20    Cardiac Studies:  ECG:   Telemetry:  SB rates 50's PAC/PVCls artifact  Echo:   Medications:    Chlorhexidine Gluconate Cloth  6 each Topical Daily   escitalopram  20 mg Oral Daily   finasteride  5 mg Oral Daily   pantoprazole  40 mg Oral Daily   pravastatin  20 mg Oral Daily      Assessment/Plan:   Bradycardia:  No AV block chronic Avoid AV nodal drugs. TTE pending [facility]  to go back to SNF after TTE if benign Will arrange outpatient 30 day monitor and f/u with Dr Lazarus Salines [facility] 08/01/2022, 8:16 AM

## 2022-08-01 NOTE — Progress Notes (Signed)
Discharge confirmed with on call Internal Medicine Dr. Rudene Christians who will follow up with case management tomorrow morning to help with coordinating HHPT/OT for him.  Pt being discharged under daughter's care with foley catheter that was placed by urologist per Dr. Kirke Corin. No other needs identified at this time.

## 2022-08-01 NOTE — Progress Notes (Signed)
  Echocardiogram 2D Echocardiogram has been performed.  Milda Smart 08/01/2022, 12:44 PM

## 2022-08-01 NOTE — Discharge Instructions (Addendum)
Matthew Knight,  It was a pleasure taking care of you at [facility]. You were admitted for presyncope and bradycardia. We are discharging you home now that you are doing better and your heart rate has stabilized. Please follow the following instructions.  1) Discontinue silodosin until follow-up with your PCP 2) Follow-up with cardiology for a 30-day monitor  Take care,  Dr. Sharrell Ku, MD, MPH

## 2022-08-01 NOTE — Evaluation (Signed)
Physical Therapy Evaluation Patient Details Name: Matthew Knight MRN: [id number] DOB: April 25, 1940 Today's Date: 08/01/2022  History of Present Illness  82 year old male presenting 07/31/22 after a near syncopal episode with heart rate in the 40s.  He lives at [facility]  PMH dementia, HTN, BPH, indwelling Foley, DM, hearing loss, Bilateral common iliac artery aneurysms, and prior CVA  Clinical Impression   Pt admitted secondary to problem above with deficits below. PTA patient was living in ILF with aides that come twice per day to assist him with ADLs (these are paid for by the [place], per daughter. Also per daughter, pt will lose the funding for the aides if he moves to ALF). Patient has intermittently used RW for ambulation (sometimes forgets to take it with him). She reports he does have a little stagger step at times if he does not have the walker, but has not fallen. Pt currently requires min assist to ambulate without a device (pt refused RW and denied having one at home). Per daughter, pt has not been walking to the dining room since he's had the catheter and is getting much less activity than he was. He is due to have a procedure soon that will allow them to remove the catheter. Feel pt would benefit from HHPT to help pt regain some of his lost stamina and balance. Anticipate patient will benefit from PT to address problems listed below.Will continue to follow acutely to maximize functional mobility independence and safety.          Recommendations for follow up therapy are one component of a multi-disciplinary discharge planning process, led by the attending physician.  Recommendations may be updated based on patient status, additional functional criteria and insurance authorization.  Follow Up Recommendations Can patient physically be transported by private vehicle: Yes     Assistance Recommended at Discharge Intermittent Supervision/Assistance  Patient can return home with the  following  A little help with walking and/or transfers;A little help with bathing/dressing/bathroom;Direct supervision/assist for medications management;Direct supervision/assist for financial management;Assist for transportation;Help with stairs or ramp for entrance    Equipment Recommendations Other (comment) (pt refusing to use RW today; later discussion with daughter he does use a RW "sometimes" but recently more often)  Recommendations for Other Services  OT consult    Functional Status Assessment Patient has had a recent decline in their functional status and demonstrates the ability to make significant improvements in function in a reasonable and predictable amount of time.     Precautions / Restrictions Precautions Precautions: Fall Precaution Comments: denies falls/falling      Mobility  Bed Mobility Overal bed mobility: Modified Independent             General bed mobility comments: incr time and effort with use of rail to get to EOB; no physical assist needed    Transfers Overall transfer level: Needs assistance Equipment used: 1 person hand held assist, None Transfers: Sit to/from Stand Sit to Stand: Min assist, Min guard           General transfer comment: initial transfer pt reaching out for UE support (given HHA); second transfer he did without assist but close guarding for safety    Ambulation/Gait Ambulation/Gait assistance: Min assist Gait Distance (Feet): 180 Feet Assistive device: None Gait Pattern/deviations: Step-through pattern, Wide base of support, Staggering left   Gait velocity interpretation: 1.31 - 2.62 ft/sec, indicative of limited community ambulator   General Gait Details: Patient repeating "I'm not as steady as  I thought I'd be"; stagger steps twice to his left after walking ~50 ft  Stairs            Wheelchair Mobility    Modified Rankin (Stroke Patients Only)       Balance Overall balance assessment: Needs  assistance Sitting-balance support: No upper extremity supported, Feet supported Sitting balance-Leahy Scale: Good     Standing balance support: No upper extremity supported Standing balance-Leahy Scale: Fair                               Pertinent Vitals/Pain Pain Assessment Pain Assessment: Faces Faces Pain Scale: No hurt    Home Living Family/patient expects to be discharged to:: Private residence Living Arrangements: Alone Available Help at Discharge: Family;Available PRN/intermittently (local daughter) Type of Home: Independent living facility           Home Equipment: Grab bars - toilet;Grab bars - tub/shower;Rolling Walker (2 wheels) Additional Comments: Pt from [facility]    Prior Function Prior Level of Function : Patient poor historian/Family not available             Mobility Comments: Per daughter, he has a RW and has been using it somewhat more consistently since he's had a foley catheter (he hands it on the walker). He doesn't remember 100% of the time, so at times he walks without a device; ADLs Comments: per dtr, he has aides (paid for by [place]) that come to his apartment twice per day and assist pt     Hand Dominance   Dominant Hand: Right    Extremity/Trunk Assessment   Upper Extremity Assessment Upper Extremity Assessment: Overall WFL for tasks assessed    Lower Extremity Assessment Lower Extremity Assessment: Overall WFL for tasks assessed    Cervical / Trunk Assessment Cervical / Trunk Assessment: Normal  Communication   Communication: HOH  Cognition Arousal/Alertness: Awake/alert Behavior During Therapy: WFL for tasks assessed/performed Overall Cognitive Status: No family/caregiver present to determine baseline cognitive functioning                                 General Comments: h/o dementia and very HOH        General Comments General comments (skin integrity, edema, etc.): HR 80s throughout  session    Exercises     Assessment/Plan    PT Assessment Patient needs continued PT services  PT Problem List Decreased balance;Decreased mobility;Decreased cognition;Decreased knowledge of use of DME;Obesity       PT Treatment Interventions DME instruction;Gait training;Functional mobility training;Therapeutic activities;Therapeutic exercise;Balance training;Cognitive remediation;Patient/family education    PT Goals (Current goals can be found in the Care Plan section)  Acute Rehab PT Goals Patient Stated Goal: get some lunch PT Goal Formulation: Patient unable to participate in goal setting Time For Goal Achievement: 08/15/22 Potential to Achieve Goals: Fair    Frequency Min 3X/week     Co-evaluation               AM-PAC PT "6 Clicks" Mobility  Outcome Measure Help needed turning from your back to your side while in a flat bed without using bedrails?: None Help needed moving from lying on your back to sitting on the side of a flat bed without using bedrails?: A Little Help needed moving to and from a bed to a chair (including a wheelchair)?: A Little Help needed  standing up from a chair using your arms (e.g., wheelchair or bedside chair)?: A Little Help needed to walk in hospital room?: A Little Help needed climbing 3-5 steps with a railing? : A Little 6 Click Score: 19    End of Session Equipment Utilized During Treatment: Gait belt Activity Tolerance: Patient tolerated treatment well Patient left: in bed;with call bell/phone within reach;with bed alarm set Nurse Communication: Mobility status;Other (comment) (concern for d/c home alone; pt reports he was not given lunch) PT Visit Diagnosis: Unsteadiness on feet (R26.81)    Time: 1610-9604 PT Time Calculation (min) (ACUTE ONLY): 15 min   Charges:   PT Evaluation $PT Eval Low Complexity: 1 Low           Jerolyn Center, PT Acute Rehabilitation Services  Office [phone]   Zena Amos 08/01/2022, 2:50  PM

## 2022-08-02 NOTE — Progress Notes (Signed)
The patient was identified using 2 approved identifiers. All issues noted in this document were discussed and addressed, Mr. Loeber daughter, [name], is his POA and she voiced understanding and agreement with all preoperative instructions. The patient was emailed the surgery instructions per her request to [email or web address]    The patient' daughter was instructed to call our Pharmacy [phone]) and the Admitting Office [phone] or [phone]) to complete their Pre-surgical Interview.     COVID Vaccine received:  []  No [x]  Yes    Date of any COVID positive Test in last 90 days:   None  PCP - Tracey Harries, MD  Cardiologist - Gavin Pound MD   Chest x-ray - 07-31-2022  1v   Epic EKG - 07-31-2022  Epic  Stress Test -  ECHO - 08-01-2022  Epic Cardiac Cath -  30- day Holter monitor- ordered for future, s/p admission for near syncope 07-31-2022  PCR screen: []  Ordered & Completed           []   No Order but Needs PROFEND           [x]   N/A for this surgery  Surgery Plan:  []  Ambulatory                            [x]  Outpatient in bed                            []  Admit  Anesthesia:    [x]  General  []  Spinal                           []   Choice []   MAC  Bowel Prep - [x]  No  []   Yes ______  Pacemaker / ICD device [x]  No []  Yes   Spinal Cord Stimulator:[x]  No []  Yes       History of Sleep Apnea? [x]  No []  Yes   CPAP used?- [x]  No []  Yes    Does the patient monitor blood sugar?          []  No []  Yes  [x]  N/A  Patient has: [x]  NO Hx DM   []  Pre-DM                 []  DM1  []   DM2  Blood Thinner / Instructions:  none Aspirin Instructions:  none  ERAS Protocol Ordered: [x]  No  []  Yes Patient is to be NPO after: midnight prior  Comments: Patient was admitted overnight for near syncope, bradycardia on 07-31-22. D/C'd by needs cardiac f/u and 30-day HM (Appt was made with Dr. Antoine Poche but for 10-07-22)  Activity level: Patient is unable to climb a flight of  stairs without difficulty; Patient can not perform ADLs without assistance.   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  Patient verbalized understanding and agreement to the Pre-Surgical Instructions that were given to them at this PAT appointment. Patient was also educated of the need to review these PAT instructions again prior to his/her surgery.I reviewed the appropriate phone numbers to call if they have any and questions or concerns.

## 2022-08-03 ENCOUNTER — Encounter: Payer: Self-pay | Admitting: *Deleted

## 2022-08-03 NOTE — Progress Notes (Signed)
Patient enrolled for [employer] to ship a 30 day cardiac event monitor to his address on file.  Letter with instructions mailed to patient. 

## 2022-08-03 NOTE — Patient Instructions (Addendum)
SURGICAL WAITING ROOM VISITATION Patients having surgery or a procedure may have no more than 2 support people in the waiting area - these visitors may rotate in the visitor waiting room.   Due to an increase in RSV and influenza rates and associated hospitalizations, children ages 10 and under may not visit patients in [facility] hospitals. If the patient needs to stay at the hospital during part of their recovery, the visitor guidelines for inpatient rooms apply.  PRE-OP VISITATION  Pre-op nurse will coordinate an appropriate time for 1 support person to accompany the patient in pre-op.  This support person may not rotate.  This visitor will be contacted when the time is appropriate for the visitor to come back in the pre-op area.  Please refer to the [facility] website for the visitor guidelines for Inpatients (after your surgery is over and you are in a regular room).  You are not required to quarantine at this time prior to your surgery. However, you must do this: Hand Hygiene often Do NOT share personal items Notify your provider if you are in close contact with someone who has COVID or you develop fever 100.4 or greater, new onset of sneezing, cough, sore throat, shortness of breath or body aches.  If you test positive for Covid or have been in contact with anyone that has tested positive in the last 10 days please notify you surgeon.    Your procedure is scheduled on:  Friday  August 13, 2022  Report to [facility] Main Entrance: [place] entrance where the [employer] is available.   Report to admitting at: 07:15  AM  +++++Call this number if you have any questions or problems the morning of surgery [phone]  DO NOT EAT OR DRINK ANYTHING AFTER MIDNIGHT THE NIGHT PRIOR TO YOUR SURGERY / PROCEDURE.    FOLLOW BOWEL PREP AND ANY ADDITIONAL PRE OP INSTRUCTIONS YOU RECEIVED FROM YOUR SURGEON'S OFFICE!!!   Oral Hygiene is also important to reduce your risk of infection.         Remember - BRUSH YOUR TEETH THE MORNING OF SURGERY WITH YOUR REGULAR TOOTHPASTE  Do NOT smoke after Midnight the night before surgery.  Take ONLY these medicines the morning of surgery with A SIP OF WATER: Finasteride (Proscar), pantoprazole (Protonix), Escitalopram (Lexapro), Isosorbide (Imdur). You may use your Eye drops.    You may not have any metal on your body including  jewelry, and body piercing  Do not wear lotions, powders,  cologne, or deodorant   Men may shave face and neck.  Contacts, Hearing Aids, dentures or bridgework may not be worn into surgery. DENTURES WILL BE REMOVED PRIOR TO SURGERY PLEASE DO NOT APPLY "Poly grip" OR ADHESIVES!!!  You may bring a small overnight bag with you on the day of surgery, only pack items that are not valuable. [facility] IS NOT RESPONSIBLE   FOR VALUABLES THAT ARE LOST OR STOLEN.   Do not bring your home medications to the hospital. The Pharmacy will dispense medications listed on your medication list to you during your admission in the Hospital.  Special Instructions: Bring a copy of your healthcare power of attorney and living will documents the day of surgery, if you wish to have them scanned into your [facility] Records- EPIC  Please read over the following fact sheets you were given: IF YOU HAVE QUESTIONS ABOUT YOUR PRE-OP INSTRUCTIONS, PLEASE CALL [phone]  [facility] - Preparing for Surgery Before  surgery, you can play an important role.  Because skin is not sterile, your skin needs to be as free of germs as possible.  You can reduce the number of germs on your skin by washing with CHG (chlorahexidine gluconate) soap before surgery.  CHG is an antiseptic cleaner which kills germs and bonds with the skin to continue killing germs even after washing. Please DO NOT use if you have an allergy to CHG or antibacterial soaps.  If your skin becomes reddened/irritated stop using the CHG and inform your nurse when you  arrive at Short Stay. Do not shave (including legs and underarms) for at least 48 hours prior to the first CHG shower.  You may shave your face/neck.  Please follow these instructions carefully:  1.  Shower with CHG Soap the night before surgery and the  morning of surgery.  2.  If you choose to wash your hair, wash your hair first as usual with your normal  shampoo.  3.  After you shampoo, rinse your hair and body thoroughly to remove the shampoo.                             4.  Use CHG as you would any other liquid soap.  You can apply chg directly to the skin and wash.  Gently with a scrungie or clean washcloth.  5.  Apply the CHG Soap to your body ONLY FROM THE NECK DOWN.   Do not use on face/ open                           Wound or open sores. Avoid contact with eyes, ears mouth and genitals (private parts).                       Wash face,  Genitals (private parts) with your normal soap.             6.  Wash thoroughly, paying special attention to the area where your  surgery  will be performed.  7.  Thoroughly rinse your body with warm water from the neck down.  8.  DO NOT shower/wash with your normal soap after using and rinsing off the CHG Soap.            9.  Pat yourself dry with a clean towel.            10.  Wear clean pajamas.            11.  Place clean sheets on your bed the night of your first shower and do not  sleep with pets.  ON THE DAY OF SURGERY : Do not apply any lotions/deodorants the morning of surgery.  Please wear clean clothes to the hospital/surgery center.    FAILURE TO FOLLOW THESE INSTRUCTIONS MAY RESULT IN THE CANCELLATION OF YOUR SURGERY  PATIENT SIGNATURE_________________________________  NURSE SIGNATURE__________________________________  ________________________________________________________________________

## 2022-08-04 ENCOUNTER — Encounter (HOSPITAL_COMMUNITY)
Admission: RE | Admit: 2022-08-04 | Discharge: 2022-08-04 | Disposition: A | Discharge status: Home / Self Care | Payer: Medicare Other | Source: Ambulatory Visit | Attending: Urology | Admitting: Urology

## 2022-08-04 ENCOUNTER — Encounter (HOSPITAL_COMMUNITY): Payer: Self-pay

## 2022-08-04 ENCOUNTER — Other Ambulatory Visit: Payer: Self-pay

## 2022-08-04 DIAGNOSIS — R7303 Prediabetes: Secondary | ICD-10-CM

## 2022-08-04 HISTORY — DX: Anxiety disorder, unspecified: F41.9

## 2022-08-04 HISTORY — DX: Unspecified osteoarthritis, unspecified site: M19.90

## 2022-08-04 HISTORY — DX: Essential (primary) hypertension: I10

## 2022-08-04 HISTORY — DX: Gastro-esophageal reflux disease without esophagitis: K21.9

## 2022-08-04 HISTORY — DX: Pneumonia, unspecified organism: J18.9

## 2022-08-06 ENCOUNTER — Encounter (HOSPITAL_COMMUNITY): Payer: Self-pay | Admitting: Physician Assistant

## 2022-08-10 ENCOUNTER — Telehealth: Payer: Self-pay | Admitting: Cardiology

## 2022-08-10 NOTE — Telephone Encounter (Signed)
error 

## 2022-08-10 NOTE — Progress Notes (Incomplete)
Case: [id number] Date/Time: 08/13/22 0915   Procedure: TRANSURETHRAL WATERJET ABLATION OF PROSTATE   Anesthesia type: General   Pre-op diagnosis: BENIGN PROSTATE HYPERPLASIA   Location: [facility] PROC[facility]URE [facility] / [facility]   Surgeons: Jerilee Field, MD       DISCUSSION: Matthew Knight is an 82 yo male who is being evaluated prior to surgery listed above. Patient with hx of BPH and has chronic indwelling foley, hx of CVA, pre-diabetes. He also has significant dementia and resides at [facility]. His daughter is his POA.  No prior anesthesia complications  Other PMH includes recent presyncopal episode for which he was admitted on 07/31/22-08/01/22. He was found to be altered by facility staff and heart rate was in the 40s. He was given IVF with subsequent improvement in his mental status. Cardiology was consulted who recommended updated Echo and to monitor him on telemetry. Updated echo showed moderately dilated LA, trivial MR, thickening of aortic valve with moderate AR.  He was last seen by Cardiology in 2019 after an episode of orthostatic syncope. Had a normal echo and a negative cardiac monitor at that time as well. He followed with cardiology afterwards in [facility]. He had a repeat echo and a stress test in 2022. Echo was reported normal and stress test with a small apical defect and an inferior diaphragmatic attenuation.   Discussed with Dr. Aleene Davidson and will request cardiac clearance prior to proceeding. Claria Dice, scheduler with Dr. Mena Goes who states she will request Cardiac clearance  VS:     08/01/2022    2:51 PM 07/31/2022   11:30 PM 07/31/2022    8:21 PM  Vitals with BMI  Systolic 146 167 454  Diastolic 62 71 80  Pulse 50  69     PROVIDERS: Tracey Harries, MD Cardiology: Dr. Antoine Poche  LABS: Obtain DOS (all labs ordered are listed, but only abnormal results are displayed)  Labs Reviewed - No data to display   IMAGES:  CXR 07/31/22: FINDINGS: Improved pulmonary  inflation with decrease in left lower lung airspace disease. Partially calcified bilateral pleural plaques.   Heart size and mediastinal contours are within normal limits. Aortic Atherosclerosis (ICD10-170.0).   Chronic blunting of the left  lateral costophrenic angle.   Orthopedic hardware in the proximal right humerus as before.   IMPRESSION: Improved pulmonary inflation. No acute findings.   EKG 08/03/22:  Sinus rhythm, rate 81   CV: Echo 08/01/22:  IMPRESSIONS     1. Left ventricular ejection fraction, by estimation, is 60 to 65%. The  left ventricle has normal function. The left ventricle has no regional  wall motion abnormalities. Left ventricular diastolic parameters were  normal.   2. Right ventricular systolic function is normal. The right ventricular  size is normal.   3. Left atrial size was moderately dilated.   4. The mitral valve is abnormal. Trivial mitral valve regurgitation. No  evidence of mitral stenosis.   5. The aortic valve is tricuspid. There is mild calcification of the  aortic valve. There is mild thickening of the aortic valve. Aortic valve  regurgitation is moderate. Aortic valve sclerosis is present, with no  evidence of aortic valve stenosis.   6. The inferior vena cava is normal in size with greater than 50%  respiratory variability, suggesting right atrial pressure of 3 mmHg.   7. Agitated saline contrast bubble study was negative, with no evidence  of any interatrial shunt.   Carotid Duplex US 03/09/21: Summary:  Right Carotid: Velocities in  the right ICA are consistent with a 1-39%  stenosis.   Left Carotid: Velocities in the left ICA are consistent with a 1-39%  stenosis.   Vertebrals: Bilateral vertebral arteries demonstrate antegrade flow.  Subclavians: Left subclavian artery was stenotic. Normal flow hemodynamics were seen in the right subclavian artery.   NM Stress test 09/11/2020:  IMPRESSION:  -Small size moderate intensity  partially reversible apical defect equivocal for tiny ischemia but could be secondary to apical thinning.  -Small size mild intensity fixed basal inferior defect more likely secondary to diaphragmatic attenuation but can't exclude tiny scar.  -Very small size mild intensity reversible basal inferolateral defect equivocal for tiny ischemia, but could be secondary to diaphragmatic attenuation.  -Diaphragmatic attenuation was noted at rest and after Lexiscan infusion and could mildly decrease specificity of this study.  -No significant ischemic burden noted. Prognostically this is probably a low risk scan. Clinical correlation is recommended.  - Left ventricular ejection fraction of 74%. Normal LV size. LV end-diastolic volume -82 ml, end-systolic volume -21 ml.  - Normal left ventricular systolic wall motion.   Holter monitoring 11/17/2017:  No significant arrhythmias No significant bradycardia. Predominant rhythm is sinus.   Dr Antoine Poche: "No significant arrhythmia but there was limited data.  It appears he only wore it for 23 hours."  Past Medical History:  Diagnosis Date   Acute blood loss anemia    Anxiety    Arthritis    Chest pain 02/10/2009   Overview:   IMPRESSION: The stress Cardiolite was normal.  Will treat for reflux and see if the chest discomfort improves.  Overview:   Overview:   IMPRESSION: The stress Cardiolite was normal.  Will treat for reflux and see if the chest discomfort improves.   Dementia (HCC)    Diabetes mellitus without complication (HCC)    Fall at home, initial encounter 03/19/2021   GERD (gastroesophageal reflux disease)    Hearing loss    Hepatitis    Hypertension    Pneumonia    Screening for colon cancer 12/03/2013   Small vessel disease (HCC)    Stroke [facility])     Past Surgical History:  Procedure Laterality Date   APPENDECTOMY     BIOPSY  07/08/2021   Procedure: BIOPSY;  Surgeon: Charlott Rakes, MD;  Location: [facility] ENDOSCOPY;  Service:  Gastroenterology;;   COLONOSCOPY WITH PROPOFOL N/A 03/24/2021   Procedure: COLONOSCOPY WITH PROPOFOL;  Surgeon: Napoleon Form, MD;  Location: [facility] ENDOSCOPY;  Service: Endoscopy;  Laterality: N/A;   COLONOSCOPY WITH PROPOFOL N/A 07/08/2021   Procedure: COLONOSCOPY WITH PROPOFOL;  Surgeon: Charlott Rakes, MD;  Location: [facility] ENDOSCOPY;  Service: Gastroenterology;  Laterality: N/A;   ESOPHAGOGASTRODUODENOSCOPY (EGD) WITH PROPOFOL N/A 03/24/2021   Procedure: ESOPHAGOGASTRODUODENOSCOPY (EGD) WITH PROPOFOL;  Surgeon: Napoleon Form, MD;  Location: [facility] ENDOSCOPY;  Service: Endoscopy;  Laterality: N/A;   FRACTURE SURGERY Left    Shoulder ORIF   GIVENS CAPSULE STUDY N/A 07/05/2021   Procedure: GIVENS CAPSULE STUDY;  Surgeon: Charlott Rakes, MD;  Location: [facility] ENDOSCOPY;  Service: Gastroenterology;  Laterality: N/A;   HEMOSTASIS CLIP PLACEMENT  03/24/2021   Procedure: HEMOSTASIS CLIP PLACEMENT;  Surgeon: Napoleon Form, MD;  Location: [facility] ENDOSCOPY;  Service: Endoscopy;;   HOT HEMOSTASIS N/A 03/24/2021   Procedure: HOT HEMOSTASIS (ARGON PLASMA COAGULATION/BICAP);  Surgeon: Napoleon Form, MD;  Location: [facility] ENDOSCOPY;  Service: Endoscopy;  Laterality: N/A;   HOT HEMOSTASIS N/A 07/08/2021   Procedure: HOT HEMOSTASIS (ARGON PLASMA COAGULATION/BICAP);  Surgeon: Bosie Clos,  Oswaldo Done, MD;  Location: [facility] ENDOSCOPY;  Service: Gastroenterology;  Laterality: N/A;   POLYPECTOMY  03/24/2021   Procedure: POLYPECTOMY;  Surgeon: Napoleon Form, MD;  Location[facility] ENDOSCOPY;  Service: Endoscopy;;   POLYPECTOMY  07/08/2021   Procedure: POLYPECTOMY;  Surgeon: Charlott Rakes, MD;  Location: [facility] ENDOSCOPY;  Service: Gastroenterology;;   SURGERY SCROTAL / TESTICULAR     ? side, fixed testicular torsion    MEDICATIONS:  acetaminophen (TYLENOL) 325 MG tablet   Cholecalciferol (VITAMIN D3) 1000 units CAPS   escitalopram (LEXAPRO) 20 MG tablet   finasteride (PROSCAR) 5 MG tablet   isosorbide  mononitrate (IMDUR) 30 MG 24 hr tablet   latanoprost (XALATAN) 0.005 % ophthalmic solution   lisinopril (ZESTRIL) 10 MG tablet   pantoprazole (PROTONIX) 40 MG tablet   polyethylene glycol (MIRALAX) 17 g packet   pravastatin (PRAVACHOL) 20 MG tablet   Prenatal Vit-Fe Fumarate-FA (PNV FOLIC ACID + IRON PO)   RHOPRESSA 0.02 % SOLN   No current facility-administered medications for this encounter.   Marcille Blanco [facility] Short Stay/Anesthesiology [facility] Phone [phone] 08/10/2022 9:30 AM

## 2022-08-10 NOTE — Telephone Encounter (Signed)
   Pre-operative Risk Assessment    Patient Name: JAIS RUBI  DOB: [birth year] MRN: [id number]      Request for Surgical Clearance    Procedure:   Ablation of the prostate  Date of Surgery:  Clearance 08/13/22                                 Surgeon:  Dr. Mena Goes Surgeon's Group or Practice Name:  [facility] Phone number:  [phone]  Fax number:  [phone]   Type of Clearance Requested:   - Medical    Type of Anesthesia:  General    Additional requests/questions:   Caller stated they will need medication clearance.    Signed, Annetta Maw   08/10/2022, 9:46 AM

## 2022-08-12 ENCOUNTER — Telehealth: Payer: Self-pay | Admitting: *Deleted

## 2022-08-12 NOTE — Telephone Encounter (Signed)
I will forward this note to Dr. Carolan Clines who has appt with pt 08/17/22.

## 2022-08-12 NOTE — Telephone Encounter (Signed)
I called the pt and s/w his daughter Matthew Knight. I informed Matthew Knight that Matthew Knight. PAC from urology has contacted our office: per Shanda Bumps, [facility]:  I am reviewing this chart per anesthesia, [name], MRN [id number].  He had recent presyncopal episode for which he was admitted on 07/31/22-08/01/22. He was found to be altered by facility staff and heart rate was in the 40s. He was given IVF with subsequent improvement in his mental status. Cardiology was consulted who recommended updated Echo and to monitor him on telemetry. Updated echo showed moderately dilated LA, trivial MR, thickening of aortic valve with moderate AR.  He was advised to follow up with cardio as outpatient.  He is now scheduled tomorrow for prostate ablation with urology and anesthesiologist have requested a cardiac clearance/input if he is stable to proceed after recent admission.  Urology has sent a clearance, but I just wanted to follow up since he is tomorrow!  Jari Favre, PAC:  it sounds like surgery needs to be canceled in light of his recent hospitalization. Usually we like patients to be seen in the office after an ER visit or hospitalization especially since this one was cardiac related. Seems like he has f/u in Aug but we might be able to get him in with an APP before that so things are not delayed until them.  Ok per pre op APP add pt to DOD schedule. Pt has been scheduled to see Dr. Carolan Clines, DOD 08/17/22 @ 1:30.   Pt's daughter is upset and says this is such a life saving surgery that her dad needs. I assured her that it is not our goal to delay our pt's surgeries. However, it is our goal to always provide the best quality care and be sure that the pt is stable and enough to proceed with surgery. Matthew Knight, (pt's daughter) is agreeable and said she is just frustrated. Matthew Knight has been given the address location for Dr. Carolan Clines at our NL location where [employer] is located and take the elevator to the 2nd floor and cardiology is  to the left coming off the elevator.   I will update all parties involved.

## 2022-08-12 NOTE — Telephone Encounter (Signed)
I will forward clearance to Dr. Wyline Mood who will see pt on 08/17/22 @ 1:30. See phone note from today 08/12/22 as well.

## 2022-08-17 ENCOUNTER — Ambulatory Visit: Payer: Medicare Other | Attending: Internal Medicine | Admitting: Internal Medicine

## 2022-08-17 VITALS — BP 138/62 | HR 55 | Ht 72.0 in | Wt 240.0 lb

## 2022-08-17 DIAGNOSIS — Z01818 Encounter for other preprocedural examination: Secondary | ICD-10-CM | POA: Diagnosis not present

## 2022-08-17 DIAGNOSIS — R9431 Abnormal electrocardiogram [ECG] [EKG]: Secondary | ICD-10-CM | POA: Diagnosis not present

## 2022-08-17 NOTE — Progress Notes (Signed)
Cardiology Office Note:    Date:  08/17/2022   ID:  Matthew Knight, DOB 05-03-1940, MRN [id number]  PCP:  Tracey Harries, MD   [facility] Providers Cardiologist:  Maisie Fus, MD     Referring MD: Tracey Harries, MD   No chief complaint on file. Pre-Op  History of Present Illness:    Matthew Knight is a 82 y.o. 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 HR was 40s. Had AMS. TSH nl. Had a UTI treated.  Otherwise this was felt to be benign, no AV block. Echo showed moderate AI. Normal LV/RV fxn.  Referral for a pre-op for transurethral waterjet ablation of prostate. No cardiac dx hx, no prior w/u. No CP or SOB.   He comes in today for pre-op. I have not met him before. He's had AMS on dementia and lives in a nursing home. No syncopal events. He has a cardiac monitor. He is mainly in a wheel chair. It's been a long time since he has done any stairs, just 1 or steps. Bp in good control.   Past Medical History:  Diagnosis Date   Acute blood loss anemia    Anxiety    Arthritis    Chest pain 02/10/2009   Overview:   IMPRESSION: The stress Cardiolite was normal.  Will treat for reflux and see if the chest discomfort improves.  Overview:   Overview:   IMPRESSION: The stress Cardiolite was normal.  Will treat for reflux and see if the chest discomfort improves.   Dementia (HCC)    Diabetes mellitus without complication (HCC)    Fall at home, initial encounter 03/19/2021   GERD (gastroesophageal reflux disease)    Hearing loss    Hepatitis    Hypertension    Pneumonia    Screening for colon cancer 12/03/2013   Small vessel disease (HCC)    Stroke [facility])     Past Surgical History:  Procedure Laterality Date   APPENDECTOMY     BIOPSY  07/08/2021   Procedure: BIOPSY;  Surgeon: Charlott Rakes, MD;   Location: [facility] ENDOSCOPY;  Service: Gastroenterology;;   COLONOSCOPY WITH PROPOFOL N/A 03/24/2021   Procedure: COLONOSCOPY WITH PROPOFOL;  Surgeon: Napoleon Form, MD;  [facility] ENDOSCOPY;  Service: Endoscopy;  Laterality: N/A;   COLONOSCOPY WITH PROPOFOL N/A 07/08/2021   Procedure: COLONOSCOPY WITH PROPOFOL;  Surgeon: Charlott Rakes, MD;  Location: [facility] ENDOSCOPY;  Service: Gastroenterology;  Laterality: N/A;   ESOPHAGOGASTRODUODENOSCOPY (EGD) WITH PROPOFOL N/A 03/24/2021   Procedure: ESOPHAGOGASTRODUODENOSCOPY (EGD) WITH PROPOFOL;  Surgeon: Napoleon Form, MD;  Location: [facility] ENDOSCOPY;  Service: Endoscopy;  Laterality: N/A;   FRACTURE SURGERY Left    Shoulder ORIF   GIVENS CAPSULE STUDY N/A 07/05/2021   Procedure: GIVENS CAPSULE STUDY;  Surgeon: Charlott Rakes, MD;  Location: [facility] ENDOSCOPY;  Service: Gastroenterology;  Laterality: N/A;   HEMOSTASIS CLIP PLACEMENT  03/24/2021   Procedure: HEMOSTASIS CLIP PLACEMENT;  Surgeon: Napoleon Form, MD;  Location[facility] ENDOSCOPY;  Service: Endoscopy;;   HOT HEMOSTASIS N/A 03/24/2021   Procedure: HOT HEMOSTASIS (ARGON PLASMA COAGULATION/BICAP);  Surgeon: Napoleon Form, MD;  Location: [facility] ENDOSCOPY;  Service: Endoscopy;  Laterality: N/A;   HOT HEMOSTASIS N/A 07/08/2021   Procedure: HOT HEMOSTASIS (ARGON PLASMA COAGULATION/BICAP);  Surgeon: Charlott Rakes, MD;  Location: [facility] ENDOSCOPY;  Service: Gastroenterology;  Laterality: N/A;   POLYPECTOMY  03/24/2021   Procedure: POLYPECTOMY;  Surgeon: Napoleon Form, MD;  Location[facility] ENDOSCOPY;  Service: Endoscopy;;   POLYPECTOMY  07/08/2021   Procedure: POLYPECTOMY;  Surgeon: Charlott Rakes, MD;  Location: [facility] ENDOSCOPY;  Service: Gastroenterology;;   SURGERY SCROTAL / TESTICULAR     ? side, fixed testicular torsion    Current Medications: Current Outpatient Medications on File Prior to Visit  Medication Sig Dispense Refill   acetaminophen (TYLENOL) 325 MG tablet Take 2  tablets (650 mg total) by mouth every 4 (four) hours as needed for mild pain, moderate pain, fever or headache.     Cholecalciferol (VITAMIN D3) 1000 units CAPS Take 1,000 Units by mouth daily.     escitalopram (LEXAPRO) 20 MG tablet Take 20 mg by mouth daily.     finasteride (PROSCAR) 5 MG tablet Take 1 tablet (5 mg total) by mouth daily. 30 tablet 3   isosorbide mononitrate (IMDUR) 30 MG 24 hr tablet Take 30 mg by mouth daily.     latanoprost (XALATAN) 0.005 % ophthalmic solution Place 1 drop into both eyes at bedtime.      lisinopril (ZESTRIL) 10 MG tablet Take 10 mg by mouth daily.     pantoprazole (PROTONIX) 40 MG tablet Take 1 tablet (40 mg total) by mouth daily. (Patient taking differently: Take 40 mg by mouth daily before breakfast.) 30 tablet 0   polyethylene glycol (MIRALAX) 17 g packet Take 17 g by mouth daily as needed for moderate constipation. 14 each 0   pravastatin (PRAVACHOL) 20 MG tablet Take 20 mg by mouth daily.     Prenatal Vit-Fe Fumarate-FA (PNV FOLIC ACID + IRON PO) Take 1 tablet by mouth daily with breakfast.     RHOPRESSA 0.02 % SOLN Place 1 drop into both eyes daily.     No current facility-administered medications on file prior to visit.    Allergies:   Aspirin, Nsaids, Darunavir, Rivastigmine, Heparin, and Sulfa antibiotics   Social History   Socioeconomic History   Marital status: Widowed    Spouse name: Not on file   Number of children: Not on file   Years of education: Not on file   Highest education level: Not on file  Occupational History   Not on file  Tobacco Use   Smoking status: Former    Types: Pipe   Smokeless tobacco: Never  Vaping Use   Vaping Use: Never used  Substance and Sexual Activity   Alcohol use: No    Alcohol/week: 0.0 standard drinks of alcohol   Drug use: No   Sexual activity: Not Currently  Other Topics Concern   Not on file  Social History Narrative   Not on file   Social Determinants of Health   Financial Resource  Strain: Not on file  Food Insecurity: Not on file  Transportation Needs: Not on file  Physical Activity: Not on file  Stress: Not on file  Social Connections: Not on file     Family History: The patient's family history includes Diabetes in his mother; Heart disease in his father; Memory loss in his paternal grandfather.  ROS:   Please see the history of present illness.     All other systems reviewed and are negative.  EKGs/Labs/Other Studies Reviewed:    The following studies were reviewed today:   EKG:  EKG is  ordered today.  The ekg ordered today demonstrates   08/17/2022- sinus bradycardia, HR 55 bpm  Recent Labs: 07/31/2022:  ALT 21; TSH 0.837 08/01/2022: BUN 15; Creatinine, Ser 1.10; Hemoglobin 13.6; Magnesium 2.2; Platelets 296; Potassium 4.0; Sodium 139   Recent Lipid Panel    Component Value Date/Time   CHOL 161 03/09/2021 0700   TRIG 114 03/09/2021 0700   HDL 42 03/09/2021 0700   CHOLHDL 3.8 03/09/2021 0700   VLDL 23 03/09/2021 0700   LDLCALC 96 03/09/2021 0700     Risk Assessment/Calculations:     Physical Exam:    VS:   Vitals:   08/17/22 1345  BP: 138/62  Pulse: (!) 55  SpO2: 96%     Wt Readings from Last 3 Encounters:  08/17/22 240 lb (108.9 kg)  05/22/22 235 lb 14.3 oz (107 kg)  03/23/22 235 lb 14.3 oz (107 kg)     GEN:  sitting in a wheelchair. Well nourished, well developed in no acute distress HEENT: Normal NECK: No JVD; No carotid bruits LYMPHATICS: No lymphadenopathy CARDIAC: bradycardic,, no murmurs, rubs, gallops RESPIRATORY:  Clear to auscultation without rales, wheezing or rhonchi  ABDOMEN: Soft, non-tender, non-distended MUSCULOSKELETAL:  No edema; No deformity  SKIN: Warm and dry NEUROLOGIC:  Alert and oriented x 3 PSYCHIATRIC:  Normal affect   ASSESSMENT:   Pre-OP: He sits in a wheelchair and is not physically active. Cannot assess METs. Will get a lexiscan. Can make final recommendations after the study.  Sinus  bradycardia: so far benign, no AV block in the hospital. Asymptomatic. Placing cardiac monitor in the clinic. He can proceed with the procedure even if he is still wearing the monitor.   PLAN:    In order of problems listed above:  Lexiscan SPECT Follow up PRN      Informed Consent   Shared Decision Making/Informed Consent The risks [chest pain, shortness of breath, cardiac arrhythmias, dizziness, blood pressure fluctuations, myocardial infarction, stroke/transient ischemic attack, nausea, vomiting, allergic reaction, radiation exposure, metallic taste sensation and life-threatening complications (estimated to be 1 in 10,000)], benefits (risk stratification, diagnosing coronary artery disease, treatment guidance) and alternatives of a nuclear stress test were discussed in detail with Matthew Knight and he agrees to proceed.       Medication Adjustments/Labs and Tests Ordered: Current medicines are reviewed at length with the patient today.  Concerns regarding medicines are outlined above.  Orders Placed This Encounter  Procedures   MYOCARDIAL PERFUSION IMAGING   EKG 12-Lead   No orders of the defined types were placed in this encounter.   Patient Instructions  Medication Instructions:  No medication ordered today *If you need a refill on your cardiac medications before your next appointment, please call your pharmacy*   Lab Work: No labs ordered today  If you have labs (blood work) drawn today and your tests are completely normal, you will receive your results only by: MyChart Message (if you have MyChart) OR A paper copy in the mail If you have any lab test that is abnormal or we need to change your treatment, we will call you to review the results.   Testing/Procedures: Your physician has requested that you have a lexiscan myoview. For further information please visit [email or web address] Please follow instruction sheet, as given. This will take place at [address]  How to prepare for your Myocardial Perfusion Test: Do not eat or drink 3 hours prior to your test, except you may have water. Do not consume products containing caffeine (regular or decaffeinated) 12 hours prior to your test. (ex: coffee, chocolate, sodas, tea). Do bring  a list of your current medications with you.  If not listed below, you may take your medications as normal. Do wear comfortable clothes (no dresses or overalls) and walking shoes, tennis shoes preferred (No heels or open toe shoes are allowed). Do NOT wear cologne, aftershave, or lotions (deodorant is allowed). The test will take approximately 3 to 4 hours to complete If these instructions are not followed, your test will have to be rescheduled.    Follow-Up: At [facility], you and your health needs are our priority.  As part of our continuing mission to provide you with exceptional heart care, we have created designated Provider Care Teams.  These Care Teams include your primary Cardiologist (physician) and Advanced Practice Providers (APPs -  Physician Assistants and Nurse Practitioners) who all work together to provide you with the care you need, when you need it.  We recommend signing up for the patient portal called "MyChart".  Sign up information is provided on this After Visit Summary.  MyChart is used to connect with patients for Virtual Visits (Telemedicine).  Patients are able to view lab/test results, encounter notes, upcoming appointments, etc.  Non-urgent messages can be sent to your provider as well.   To learn more about what you can do with MyChart, go to [email or web address].    Your next appointment:    As needed  Provider:   Maisie Fus, MD        Signed, Maisie Fus, MD  08/17/2022 2:04 PM    [facility]

## 2022-08-17 NOTE — Patient Instructions (Signed)
Medication Instructions:  No medication ordered today *If you need a refill on your cardiac medications before your next appointment, please call your pharmacy*   Lab Work: No labs ordered today  If you have labs (blood work) drawn today and your tests are completely normal, you will receive your results only by: MyChart Message (if you have MyChart) OR A paper copy in the mail If you have any lab test that is abnormal or we need to change your treatment, we will call you to review the results.   Testing/Procedures: Your physician has requested that you have a lexiscan myoview. For further information please visit [email or web address] Please follow instruction sheet, as given. This will take place at [address]  How to prepare for your Myocardial Perfusion Test: Do not eat or drink 3 hours prior to your test, except you may have water. Do not consume products containing caffeine (regular or decaffeinated) 12 hours prior to your test. (ex: coffee, chocolate, sodas, tea). Do bring a list of your current medications with you.  If not listed below, you may take your medications as normal. Do wear comfortable clothes (no dresses or overalls) and walking shoes, tennis shoes preferred (No heels or open toe shoes are allowed). Do NOT wear cologne, aftershave, or lotions (deodorant is allowed). The test will take approximately 3 to 4 hours to complete If these instructions are not followed, your test will have to be rescheduled.    Follow-Up: At [facility], you and your health needs are our priority.  As part of our continuing mission to provide you with exceptional heart care, we have created designated Provider Care Teams.  These Care Teams include your primary Cardiologist (physician) and Advanced Practice Providers (APPs -  Physician Assistants and Nurse Practitioners) who all work together to provide you with the care you need, when you need it.  We recommend  signing up for the patient portal called "MyChart".  Sign up information is provided on this After Visit Summary.  MyChart is used to connect with patients for Virtual Visits (Telemedicine).  Patients are able to view lab/test results, encounter notes, upcoming appointments, etc.  Non-urgent messages can be sent to your provider as well.   To learn more about what you can do with MyChart, go to [email or web address].    Your next appointment:    As needed  Provider:   Maisie Fus, MD

## 2022-08-18 ENCOUNTER — Telehealth (HOSPITAL_COMMUNITY): Payer: Self-pay | Admitting: *Deleted

## 2022-08-18 NOTE — Telephone Encounter (Signed)
Spoke with patient's daughter [name] and she was given detailed instructions about the STRESS TEST on 08/19/22. She will be bringing him and staying with him due to his dementia.

## 2022-08-19 ENCOUNTER — Ambulatory Visit (HOSPITAL_COMMUNITY): Payer: Medicare Other | Attending: Cardiology

## 2022-08-19 DIAGNOSIS — R9431 Abnormal electrocardiogram [ECG] [EKG]: Secondary | ICD-10-CM | POA: Insufficient documentation

## 2022-08-19 DIAGNOSIS — Z01818 Encounter for other preprocedural examination: Secondary | ICD-10-CM | POA: Insufficient documentation

## 2022-08-19 LAB — MYOCARDIAL PERFUSION IMAGING
Base ST Depression (mm): 0 mm
LV dias vol: 70 mL (ref 62–150)
LV sys vol: 30 mL
Nuc Stress EF: 57 %
Peak HR: 93 {beats}/min
Rest HR: 56 {beats}/min
Rest Nuclear Isotope Dose: 10.5 mCi
SDS: 2
SRS: 0
SSS: 2
ST Depression (mm): 0 mm
Stress Nuclear Isotope Dose: 31.7 mCi
TID: 1.02

## 2022-08-19 MED ORDER — TECHNETIUM TC 99M TETROFOSMIN IV KIT
10.5000 | PACK | Freq: Once | INTRAVENOUS | Status: AC | PRN
  Administered 2022-08-19: 10.5 via INTRAVENOUS

## 2022-08-19 MED ORDER — REGADENOSON 0.4 MG/5ML IV SOLN
0.4000 mg | Freq: Once | INTRAVENOUS | Status: AC
Start: 2022-08-19 — End: 2022-08-19
  Administered 2022-08-19: 0.4 mg via INTRAVENOUS

## 2022-08-19 MED ORDER — TECHNETIUM TC 99M TETROFOSMIN IV KIT
31.7000 | PACK | Freq: Once | INTRAVENOUS | Status: AC | PRN
  Administered 2022-08-19: 31.7 via INTRAVENOUS

## 2022-08-19 NOTE — Addendum Note (Signed)
Addended byCarolan Clines on: 08/19/2022 11:18 AM   Modules accepted: Orders

## 2022-08-20 NOTE — Telephone Encounter (Signed)
   Primary Cardiologist: Maisie Fus, MD  Chart reviewed as part of pre-operative protocol coverage. Given past medical history and time since last visit, based on ACC/AHA guidelines, RANDEL NARASIMHAN would be at acceptable risk for the planned procedure without further cardiovascular testing.   Patient was advised that if he develops new symptoms prior to surgery to contact our office to arrange a follow-up appointment.    I will route this recommendation to the requesting party via Epic fax function and remove from pre-op pool.  Please call with questions.  Levi Aland, NP-C  08/20/2022, 7:24 AM [address] Office [phone] Fax [phone]

## 2022-09-03 ENCOUNTER — Other Ambulatory Visit: Payer: Self-pay | Admitting: Urology

## 2022-09-17 ENCOUNTER — Ambulatory Visit (HOSPITAL_COMMUNITY): Admission: RE | Admit: 2022-09-17 | Payer: Medicare Other | Source: Ambulatory Visit | Admitting: Urology

## 2022-09-17 ENCOUNTER — Encounter (HOSPITAL_COMMUNITY): Admission: RE | Payer: Self-pay | Source: Ambulatory Visit

## 2022-09-17 SURGERY — ABLATION, PROSTATE, TRANSURETHRAL, USING WATERJET
Anesthesia: General

## 2022-09-18 ENCOUNTER — Other Ambulatory Visit: Payer: Self-pay

## 2022-09-18 ENCOUNTER — Emergency Department (HOSPITAL_COMMUNITY)
Admission: EM | Admit: 2022-09-18 | Discharge: 2022-09-18 | Disposition: A | Discharge status: Home / Self Care | Payer: Medicare Other | Attending: Emergency Medicine | Admitting: Emergency Medicine

## 2022-09-18 DIAGNOSIS — D72829 Elevated white blood cell count, unspecified: Secondary | ICD-10-CM | POA: Insufficient documentation

## 2022-09-18 DIAGNOSIS — N390 Urinary tract infection, site not specified: Secondary | ICD-10-CM | POA: Diagnosis not present

## 2022-09-18 DIAGNOSIS — T83511A Infection and inflammatory reaction due to indwelling urethral catheter, initial encounter: Secondary | ICD-10-CM | POA: Insufficient documentation

## 2022-09-18 DIAGNOSIS — F0390 Unspecified dementia without behavioral disturbance: Secondary | ICD-10-CM | POA: Diagnosis not present

## 2022-09-18 DIAGNOSIS — T839XXA Unspecified complication of genitourinary prosthetic device, implant and graft, initial encounter: Secondary | ICD-10-CM | POA: Insufficient documentation

## 2022-09-18 LAB — URINALYSIS, ROUTINE W REFLEX MICROSCOPIC
Bilirubin Urine: NEGATIVE
Glucose, UA: NEGATIVE mg/dL
Ketones, ur: 5 mg/dL — AB
Nitrite: NEGATIVE
Protein, ur: 30 mg/dL — AB
Specific Gravity, Urine: 1.019 (ref 1.005–1.030)
WBC, UA: >50 WBC/hpf (ref 0–5)
pH: 5 (ref 5.0–8.0)

## 2022-09-18 LAB — CBC WITH DIFFERENTIAL/PLATELET
Abs Immature Granulocytes: 0.07 10*3/uL (ref 0.00–0.07)
Basophils Absolute: 0.1 10*3/uL (ref 0.0–0.1)
Basophils Relative: 1 %
Eosinophils Absolute: 0 10*3/uL (ref 0.0–0.5)
Eosinophils Relative: 0 %
HCT: 39.6 % (ref 39.0–52.0)
Hemoglobin: 12.7 g/dL — ABNORMAL LOW (ref 13.0–17.0)
Immature Granulocytes: 1 %
Lymphocytes Relative: 11 %
Lymphs Abs: 1.7 10*3/uL (ref 0.7–4.0)
MCH: 26.5 pg (ref 26.0–34.0)
MCHC: 32.1 g/dL (ref 30.0–36.0)
MCV: 82.5 fL (ref 80.0–100.0)
Monocytes Absolute: 1.4 10*3/uL — ABNORMAL HIGH (ref 0.1–1.0)
Monocytes Relative: 9 %
Neutro Abs: 11.6 10*3/uL — ABNORMAL HIGH (ref 1.7–7.7)
Neutrophils Relative %: 78 %
Platelets: 360 10*3/uL (ref 150–400)
RBC: 4.8 MIL/uL (ref 4.22–5.81)
RDW: 14 % (ref 11.5–15.5)
WBC: 14.8 10*3/uL — ABNORMAL HIGH (ref 4.0–10.5)
nRBC: 0 % (ref 0.0–0.2)

## 2022-09-18 LAB — COMPREHENSIVE METABOLIC PANEL
ALT: 22 U/L (ref 0–44)
AST: 29 U/L (ref 15–41)
Albumin: 3.9 g/dL (ref 3.5–5.0)
Alkaline Phosphatase: 66 U/L (ref 38–126)
Anion gap: 10 (ref 5–15)
BUN: 22 mg/dL (ref 8–23)
CO2: 20 mmol/L — ABNORMAL LOW (ref 22–32)
Calcium: 9.1 mg/dL (ref 8.9–10.3)
Chloride: 106 mmol/L (ref 98–111)
Creatinine, Ser: 1.4 mg/dL — ABNORMAL HIGH (ref 0.61–1.24)
GFR, Estimated: 50 mL/min — ABNORMAL LOW (ref >60)
Glucose, Bld: 117 mg/dL — ABNORMAL HIGH (ref 70–99)
Potassium: 3.4 mmol/L — ABNORMAL LOW (ref 3.5–5.1)
Sodium: 136 mmol/L (ref 135–145)
Total Bilirubin: 1.1 mg/dL (ref 0.3–1.2)
Total Protein: 7.3 g/dL (ref 6.5–8.1)

## 2022-09-18 MED ORDER — SODIUM CHLORIDE 0.9 % IV SOLN
1.0000 g | Freq: Once | INTRAVENOUS | Status: AC
  Administered 2022-09-18: 1 g via INTRAVENOUS
  Filled 2022-09-18: qty 10

## 2022-09-18 MED ORDER — LIDOCAINE HCL URETHRAL/MUCOSAL 2 % EX GEL
1.0000 | Freq: Once | CUTANEOUS | Status: AC
  Administered 2022-09-18: 1 via TOPICAL
  Filled 2022-09-18: qty 11

## 2022-09-18 MED ORDER — CEPHALEXIN 500 MG PO CAPS: 500.0000 mg | ORAL_CAPSULE | Freq: Three times a day (TID) | ORAL | 0 refills | Status: DC

## 2022-09-18 NOTE — Discharge Instructions (Addendum)
Your catheter was replaced.  There are signs of urinary tract infection and I prescribed Keflex 500 mg 3 times a day for 10 days  Follow-up with your urologist, Dr. Mena Goes   Return to ER if the Foley is not draining or has severe pain or blood in your urine

## 2022-09-18 NOTE — ED Provider Notes (Signed)
[place] EMERGENCY DEPARTMENT AT [facility] Provider Note   CSN: [id number] Arrival date & time: 09/18/22  2009     History  Chief Complaint  Patient presents with   Urinary Catheter Problem    Abdominal Pain    GIB AYLSWORTH is a 82 y.o. male history of dementia, chronic indwelling Foley due to BPH, recurrent UTI here presenting with catheter not draining.  [facility] does [facility].  Nursing staff noticed that he has been having abdominal distention.  They also noticed that there is no output inside his catheter and there is some leakage around his catheter today.  Patient demented and unable to give me much history.  The history is provided by the patient.       Home Medications Prior to Admission medications   Medication Sig Start Date End Date Taking? Authorizing Provider  acetaminophen (TYLENOL) 325 MG tablet Take 2 tablets (650 mg total) by mouth every 4 (four) hours as needed for mild pain, moderate pain, fever or headache. 03/11/21   Tyrone Nine, MD  Cholecalciferol (VITAMIN D3) 1000 units CAPS Take 1,000 Units by mouth daily.    [provider]  escitalopram (LEXAPRO) 20 MG tablet Take 20 mg by mouth daily. 07/11/17   [provider]  finasteride (PROSCAR) 5 MG tablet Take 1 tablet (5 mg total) by mouth daily. 08/02/22   Steffanie Rainwater, MD  isosorbide mononitrate (IMDUR) 30 MG 24 hr tablet Take 30 mg by mouth daily. 04/28/20   [provider]  latanoprost (XALATAN) 0.005 % ophthalmic solution Place 1 drop into both eyes at bedtime.  07/27/17   [provider]  lisinopril (ZESTRIL) 10 MG tablet Take 10 mg by mouth daily. 02/01/20   [provider]  pantoprazole (PROTONIX) 40 MG tablet Take 1 tablet (40 mg total) by mouth daily. Patient taking differently: Take 40 mg by mouth daily before breakfast. 07/11/21   Marguerita Merles Latif, DO  polyethylene glycol (MIRALAX) 17 g packet Take 17 g by mouth daily  as needed for moderate constipation. 07/04/21   Meredeth Ide, MD  pravastatin (PRAVACHOL) 20 MG tablet Take 20 mg by mouth daily. 04/28/20   [provider]  Prenatal Vit-Fe Fumarate-FA (PNV FOLIC ACID + IRON PO) Take 1 tablet by mouth daily with breakfast.    [provider]  RHOPRESSA 0.02 % SOLN Place 1 drop into both eyes daily. 11/15/18   [provider]      Allergies    Aspirin, Nsaids, Darunavir, Rivastigmine, Heparin, and Sulfa antibiotics    Review of Systems   Review of Systems  Gastrointestinal:  Positive for abdominal pain.  All other systems reviewed and are negative.   Physical Exam Updated Vital Signs BP (!) 164/78   Pulse 87   Temp 98.4 F (36.9 C) (Oral)   Resp (!) 21   Wt 102.4 kg   SpO2 94%   BMI 30.62 kg/m  Physical Exam Vitals and nursing note reviewed.  Constitutional:      Comments: Chronically ill and demented  HENT:     Head: Normocephalic.     Mouth/Throat:     Pharynx: Oropharynx is clear.  Eyes:     Extraocular Movements: Extraocular movements intact.  Cardiovascular:     Rate and Rhythm: Normal rate and regular rhythm.     Heart sounds: Normal heart sounds.  Pulmonary:     Effort: Pulmonary effort is normal.     Breath sounds:  Normal breath sounds.  Abdominal:     Comments: Mild suprapubic distention.  Bladder appears enlarged.  Foley catheter with no urine inside the catheter  Skin:    General: Skin is warm.     Capillary Refill: Capillary refill takes less than 2 seconds.  Neurological:     Comments: Demented and ANO x 1  Psychiatric:     Comments: Unable      ED Results / Procedures / Treatments   Labs (all labs ordered are listed, but only abnormal results are displayed) Labs Reviewed  URINE CULTURE  CBC WITH DIFFERENTIAL/PLATELET  COMPREHENSIVE METABOLIC PANEL  URINALYSIS, ROUTINE W REFLEX MICROSCOPIC    EKG None  Radiology No results found.  Procedures Procedures    Medications  Ordered in ED Medications  lidocaine (XYLOCAINE) 2 % jelly 1 Application (1 Application Topical Given 09/18/22 2030)    ED Course/ Medical Decision Making/ A&P                             Medical Decision Making CONNARD THORN is a 82 y.o. male history of dementia and indwelling catheter here presenting with Foley malfunctioning.  His Foley does not seem to be draining and there is drainage around the Foley.  Will have nursing replace the catheter.  Will get CBC and CMP and urinalysis.  10:17 PM I reviewed patient's labs and white blood cell count is 15.  Patient's creatinine is 1.4.  Foley was replaced and about 1.5 L came out.  Patient's UA showed greater than 50 white blood cell count.  I think he likely has catheter associated UTI.  Given Rocephin and patient will be discharged back to facility with Keflex.  Patient can follow-up with his urologist.  Problems Addressed: Complication of Foley catheter, initial encounter [facility]): acute illness or injury Urinary tract infection associated with indwelling urethral catheter, initial encounter [facility]): acute illness or injury  Amount and/or Complexity of Data Reviewed Labs: ordered. Decision-making details documented in ED Course.    Final Clinical Impression(s) / ED Diagnoses Final diagnoses:  None    Rx / DC Orders ED Discharge Orders     None         Charlynne Pander, MD 09/18/22 2218

## 2022-09-18 NOTE — ED Triage Notes (Signed)
Pt arrives EMS from [facility] with urinary catheter leaking. Pt has been reporting abdominal pain. Pt alert and oriented x3 with hx of dementia.

## 2022-09-18 NOTE — ED Notes (Signed)
Called and spoke with daughter who was given an update and stated that she would be able to come and pick pt up.

## 2022-09-18 NOTE — ED Notes (Signed)
RN called [facility] facility where pt lives and was told by staff that "pt is in independent living and his daughter should be there with him." Daughter has been called and is on her way to pick up pt.

## 2022-09-20 LAB — URINE CULTURE

## 2022-09-22 LAB — URINE CULTURE

## 2022-09-23 ENCOUNTER — Telehealth (HOSPITAL_BASED_OUTPATIENT_CLINIC_OR_DEPARTMENT_OTHER): Payer: Self-pay | Admitting: *Deleted

## 2022-09-23 NOTE — Telephone Encounter (Signed)
Post ED Visit - Positive Culture Follow-up  Culture report reviewed by antimicrobial stewardship pharmacist: [facility] []  Enzo Bi, Pharm.D. []  Celedonio Miyamoto, Pharm.D., BCPS AQ-ID []  Garvin Fila, Pharm.D., BCPS []  Georgina Pillion, [address].D., BCPS []  [place], [address].D., BCPS, AAHIVP []  Estella Husk, Pharm.D., BCPS, AAHIVP []  Lysle Pearl, PharmD, BCPS []  Phillips Climes, PharmD, BCPS []  Agapito Games, PharmD, BCPS []  Verlan Friends, PharmD []  Mervyn Gay, PharmD, BCPS []  Vinnie Level, PharmD  [facility] []  Len Childs, PharmD []  Greer Pickerel, PharmD []  Adalberto Cole, PharmD []  Perlie Gold, Rph []  Lonell Face) Jean Rosenthal, PharmD []  Earl Many, PharmD []  Junita Push, PharmD []  Dorna Leitz, PharmD []  Terrilee Files, PharmD []  Lynann Beaver, PharmD []  Keturah Barre, PharmD []  Loralee Pacas, PharmD [x]  Georgina Pillion, PharmD   Positive urine culture Treated with Cephalexin, organism sensitive to the same and no further patient follow-up is required at this time.  Patsey Berthold 09/23/2022, 9:08 AM

## 2022-10-06 ENCOUNTER — Encounter: Payer: Self-pay | Admitting: Cardiology

## 2022-10-06 NOTE — Progress Notes (Deleted)
Cardiology Office Note   Date:  10/06/2022   ID:  Matthew Knight, DOB December 02, 1940, MRN [id number]  PCP:  Tracey Harries, MD  Cardiologist:   Maisie Fus, MD Referring:  ***  No chief complaint on file.     History of Present Illness: Matthew Knight is a 82 y.o. male who presents for ***  .  He saw Dr. Tereso Newcomer for evaluation of chest pain.    He had a perfusion study in June 2024.  This was negative for ischemia.  ***     Past Medical History:  Diagnosis Date   Acute blood loss anemia    Anxiety    Arthritis    Chest pain 02/10/2009   Overview:   IMPRESSION: The stress Cardiolite was normal.  Will treat for reflux and see if the chest discomfort improves.  Overview:   Overview:   IMPRESSION: The stress Cardiolite was normal.  Will treat for reflux and see if the chest discomfort improves.   Dementia (HCC)    Diabetes mellitus without complication (HCC)    Fall at home, initial encounter 03/19/2021   GERD (gastroesophageal reflux disease)    Hearing loss    Hepatitis    Hypertension    Pneumonia    Screening for colon cancer 12/03/2013   Small vessel disease (HCC)    Stroke [facility])     Past Surgical History:  Procedure Laterality Date   APPENDECTOMY     BIOPSY  07/08/2021   Procedure: BIOPSY;  Surgeon: Charlott Rakes, MD;  Location: [facility] ENDOSCOPY;  Service: Gastroenterology;;   COLONOSCOPY WITH PROPOFOL N/A 03/24/2021   Procedure: COLONOSCOPY WITH PROPOFOL;  Surgeon: Napoleon Form, MD;  Location: [facility] ENDOSCOPY;  Service: Endoscopy;  Laterality: N/A;   COLONOSCOPY WITH PROPOFOL N/A 07/08/2021   Procedure: COLONOSCOPY WITH PROPOFOL;  Surgeon: Charlott Rakes, MD;  Location: [facility] ENDOSCOPY;  Service: Gastroenterology;  Laterality: N/A;   ESOPHAGOGASTRODUODENOSCOPY (EGD) WITH PROPOFOL N/A 03/24/2021   Procedure: ESOPHAGOGASTRODUODENOSCOPY (EGD) WITH PROPOFOL;  Surgeon: Napoleon Form, MD;  Location: [facility] ENDOSCOPY;  Service: Endoscopy;   Laterality: N/A;   FRACTURE SURGERY Left    Shoulder ORIF   GIVENS CAPSULE STUDY N/A 07/05/2021   Procedure: GIVENS CAPSULE STUDY;  Surgeon: Charlott Rakes, MD;  Location: [facility] ENDOSCOPY;  Service: Gastroenterology;  Laterality: N/A;   HEMOSTASIS CLIP PLACEMENT  03/24/2021   Procedure: HEMOSTASIS CLIP PLACEMENT;  Surgeon: Napoleon Form, MD;  Location: [facility] ENDOSCOPY;  Service: Endoscopy;;   HOT HEMOSTASIS N/A 03/24/2021   Procedure: HOT HEMOSTASIS (ARGON PLASMA COAGULATION/BICAP);  Surgeon: Napoleon Form, MD;  Location: [facility] ENDOSCOPY;  Service: Endoscopy;  Laterality: N/A;   HOT HEMOSTASIS N/A 07/08/2021   Procedure: HOT HEMOSTASIS (ARGON PLASMA COAGULATION/BICAP);  Surgeon: Charlott Rakes, MD;  Location: [facility] ENDOSCOPY;  Service: Gastroenterology;  Laterality: N/A;   POLYPECTOMY  03/24/2021   Procedure: POLYPECTOMY;  Surgeon: Napoleon Form, MD;  Location: [facility] ENDOSCOPY;  Service: Endoscopy;;   POLYPECTOMY  07/08/2021   Procedure: POLYPECTOMY;  Surgeon: Charlott Rakes, MD;  Location: [facility] ENDOSCOPY;  Service: Gastroenterology;;   SURGERY SCROTAL / TESTICULAR     ? side, fixed testicular torsion     Current Outpatient Medications  Medication Sig Dispense Refill   acetaminophen (TYLENOL) 325 MG tablet Take 2 tablets (650 mg total) by mouth every 4 (four) hours as needed for mild pain, moderate pain, fever or headache.     cephALEXin (KEFLEX) 500 MG capsule Take 1 capsule (500 mg total)  by mouth 3 (three) times daily. 30 capsule 0   Cholecalciferol (VITAMIN D3) 1000 units CAPS Take 1,000 Units by mouth daily.     escitalopram (LEXAPRO) 20 MG tablet Take 20 mg by mouth daily.     finasteride (PROSCAR) 5 MG tablet Take 1 tablet (5 mg total) by mouth daily. 30 tablet 3   isosorbide mononitrate (IMDUR) 30 MG 24 hr tablet Take 30 mg by mouth daily.     latanoprost (XALATAN) 0.005 % ophthalmic solution Place 1 drop into both eyes at bedtime.      lisinopril (ZESTRIL) 10 MG tablet  Take 10 mg by mouth daily.     pantoprazole (PROTONIX) 40 MG tablet Take 1 tablet (40 mg total) by mouth daily. (Patient taking differently: Take 40 mg by mouth daily before breakfast.) 30 tablet 0   polyethylene glycol (MIRALAX) 17 g packet Take 17 g by mouth daily as needed for moderate constipation. 14 each 0   pravastatin (PRAVACHOL) 20 MG tablet Take 20 mg by mouth daily.     Prenatal Vit-Fe Fumarate-FA (PNV FOLIC ACID + IRON PO) Take 1 tablet by mouth daily with breakfast.     RHOPRESSA 0.02 % SOLN Place 1 drop into both eyes daily.     No current facility-administered medications for this visit.    Allergies:   Aspirin, Nsaids, Darunavir, Rivastigmine, Heparin, and Sulfa antibiotics    Social History:  The patient  reports that he has quit smoking. His smoking use included pipe. He has never used smokeless tobacco. He reports that he does not drink alcohol and does not use drugs.   Family History:  The patient's ***family history includes Diabetes in his mother; Heart disease in his father; Memory loss in his paternal grandfather.    ROS:  Please see the history of present illness.   Otherwise, review of systems are positive for {NONE DEFAULTED:18576}.   All other systems are reviewed and negative.    PHYSICAL EXAM: VS:  There were no vitals taken for this visit. , BMI There is no height or weight on file to calculate BMI. GENERAL:  Well appearing HEENT:  Pupils equal round and reactive, fundi not visualized, oral mucosa unremarkable NECK:  No jugular venous distention, waveform within normal limits, carotid upstroke brisk and symmetric, no bruits, no thyromegaly LYMPHATICS:  No cervical, inguinal adenopathy LUNGS:  Clear to auscultation bilaterally BACK:  No CVA tenderness CHEST:  Unremarkable HEART:  PMI not displaced or sustained,S1 and S2 within normal limits, no S3, no S4, no clicks, no rubs, *** murmurs ABD:  Flat, positive bowel sounds normal in frequency in pitch, no  bruits, no rebound, no guarding, no midline pulsatile mass, no hepatomegaly, no splenomegaly EXT:  2 plus pulses throughout, no edema, no cyanosis no clubbing SKIN:  No rashes no nodules NEURO:  Cranial nerves II through XII grossly intact, motor grossly intact throughout PSYCH:  Cognitively intact, oriented to person place and time    EKG:        Recent Labs: 07/31/2022: TSH 0.837 08/01/2022: Magnesium 2.2 09/18/2022: ALT 22; BUN 22; Creatinine, Ser 1.40; Hemoglobin 12.7; Platelets 360; Potassium 3.4; Sodium 136    Lipid Panel    Component Value Date/Time   CHOL 161 03/09/2021 0700   TRIG 114 03/09/2021 0700   HDL 42 03/09/2021 0700   CHOLHDL 3.8 03/09/2021 0700   VLDL 23 03/09/2021 0700   LDLCALC 96 03/09/2021 0700      Wt Readings from Last 3 Encounters:  09/18/22 225 lb 12 oz (102.4 kg)  08/19/22 240 lb (108.9 kg)  08/17/22 240 lb (108.9 kg)      Other studies Reviewed: Additional studies/ records that were reviewed today include: ***. Review of the above records demonstrates:  Please see elsewhere in the note.  ***   ASSESSMENT AND PLAN:  Chest pain:  ***   Current medicines are reviewed at length with the patient today.  The patient {ACTIONS; HAS/DOES NOT HAVE:19233} concerns regarding medicines.  The following changes have been made:  {PLAN; NO CHANGE:13088:s}  Labs/ tests ordered today include: *** No orders of the defined types were placed in this encounter.    Disposition:   FU with ***    Signed, Rollene Rotunda, MD  10/06/2022 7:56 AM    [facility]

## 2022-10-06 NOTE — Progress Notes (Unsigned)
Cardiology Office Note   Date:  10/07/2022   ID:  Matthew Knight, DOB 1940-12-22, MRN [id number]  PCP:  Tracey Harries, MD  Cardiologist:   Maisie Fus, MD Referring:  Tracey Harries, MD  Chief Complaint  Patient presents with   Pre-op Exam      History of Present Illness: Matthew Knight is a 82 y.o. male who presents for preop follow up  .  He saw Dr. Tereso Newcomer for evaluation of chest pain.    He had a perfusion study in June .  This was negative for ischemia.  He did have an echo in May of this. Moderate AI but otherwise well-preserved ejection fraction.  He is to have prostate surgery.  He has an indwelling Foley catheter.  He denies any cardiovascular symptoms such as chest pressure, neck or arm discomfort.  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  He is here with his son.  He gets around slowly with a walker.  For longer trips he gets around with a wheelchair.  There was no clear history of chest discomfort.  He is had no cardiac events.   Past Medical History:  Diagnosis Date   Acute blood loss anemia    Anxiety    Arthritis    Dementia (HCC)    Diabetes mellitus without complication (HCC)    Fall at home, initial encounter 03/19/2021   GERD (gastroesophageal reflux disease)    Hearing loss    Hepatitis    Hypertension    Pneumonia    Screening for colon cancer 12/03/2013   Small vessel disease (HCC)    Stroke [facility])     Past Surgical History:  Procedure Laterality Date   APPENDECTOMY     BIOPSY  07/08/2021   Procedure: BIOPSY;  Surgeon: Charlott Rakes, MD;  Location: [facility] ENDOSCOPY;  Service: Gastroenterology;;   COLONOSCOPY WITH PROPOFOL N/A 03/24/2021   Procedure: COLONOSCOPY WITH PROPOFOL;  Surgeon: Napoleon Form, MD;  Location: [facility] ENDOSCOPY;  Service: Endoscopy;  Laterality: N/A;   COLONOSCOPY WITH PROPOFOL N/A 07/08/2021   Procedure: COLONOSCOPY WITH PROPOFOL;  Surgeon: Charlott Rakes, MD;  Location: [facility]  ENDOSCOPY;  Service: Gastroenterology;  Laterality: N/A;   ESOPHAGOGASTRODUODENOSCOPY (EGD) WITH PROPOFOL N/A 03/24/2021   Procedure: ESOPHAGOGASTRODUODENOSCOPY (EGD) WITH PROPOFOL;  Surgeon: Napoleon Form, MD;  Location: [facility] ENDOSCOPY;  Service: Endoscopy;  Laterality: N/A;   FRACTURE SURGERY Left    Shoulder ORIF   GIVENS CAPSULE STUDY N/A 07/05/2021   Procedure: GIVENS CAPSULE STUDY;  Surgeon: Charlott Rakes, MD;  Location: [facility] ENDOSCOPY;  Service: Gastroenterology;  Laterality: N/A;   HEMOSTASIS CLIP PLACEMENT  03/24/2021   Procedure: HEMOSTASIS CLIP PLACEMENT;  Surgeon: Napoleon Form, MD;  Location: [facility] ENDOSCOPY;  Service: Endoscopy;;   HOT HEMOSTASIS N/A 03/24/2021   Procedure: HOT HEMOSTASIS (ARGON PLASMA COAGULATION/BICAP);  Surgeon: Napoleon Form, MD;  Location: [facility] ENDOSCOPY;  Service: Endoscopy;  Laterality: N/A;   HOT HEMOSTASIS N/A 07/08/2021   Procedure: HOT HEMOSTASIS (ARGON PLASMA COAGULATION/BICAP);  Surgeon: Charlott Rakes, MD;  Location: [facility] ENDOSCOPY;  Service: Gastroenterology;  Laterality: N/A;   POLYPECTOMY  03/24/2021   Procedure: POLYPECTOMY;  Surgeon: Napoleon Form, MD;  Location: [facility] ENDOSCOPY;  Service: Endoscopy;;   POLYPECTOMY  07/08/2021   Procedure: POLYPECTOMY;  Surgeon: Charlott Rakes, MD;  Location: [facility] ENDOSCOPY;  Service: Gastroenterology;;   SURGERY SCROTAL / TESTICULAR     ? side, fixed testicular torsion     Current  Outpatient Medications  Medication Sig Dispense Refill   acetaminophen (TYLENOL) 325 MG tablet Take 2 tablets (650 mg total) by mouth every 4 (four) hours as needed for mild pain, moderate pain, fever or headache.     cephALEXin (KEFLEX) 500 MG capsule Take 1 capsule (500 mg total) by mouth 3 (three) times daily. 30 capsule 0   Cholecalciferol (VITAMIN D3) 1000 units CAPS Take 1,000 Units by mouth daily.     docusate sodium (COLACE) 100 MG capsule Take 100 mg by mouth every other day.     escitalopram (LEXAPRO)  20 MG tablet Take 20 mg by mouth daily.     finasteride (PROSCAR) 5 MG tablet Take 1 tablet (5 mg total) by mouth daily. 30 tablet 3   latanoprost (XALATAN) 0.005 % ophthalmic solution Place 1 drop into both eyes at bedtime.      lisinopril (ZESTRIL) 10 MG tablet Take 10 mg by mouth daily.     pantoprazole (PROTONIX) 40 MG tablet Take 1 tablet (40 mg total) by mouth daily. (Patient taking differently: Take 40 mg by mouth daily before breakfast.) 30 tablet 0   polyethylene glycol (MIRALAX) 17 g packet Take 17 g by mouth daily as needed for moderate constipation. 14 each 0   pravastatin (PRAVACHOL) 20 MG tablet Take 20 mg by mouth daily.     Prenatal Vit-Fe Fumarate-FA (PNV FOLIC ACID + IRON PO) Take 1 tablet by mouth daily with breakfast.     RHOPRESSA 0.02 % SOLN Place 1 drop into both eyes daily.     silodosin (RAPAFLO) 8 MG CAPS capsule Take 8 mg by mouth daily.     No current facility-administered medications for this visit.    Allergies:   Aspirin, Nsaids, Darunavir, Rivastigmine, Heparin, and Sulfa antibiotics     ROS:  Please see the history of present illness.   Otherwise, review of systems are positive for none.   All other systems are reviewed and negative.    PHYSICAL EXAM: VS:  BP 116/62 (BP Location: Left Arm, Patient Position: Sitting, Cuff Size: Normal)   Pulse (!) 51   Ht 6' (1.829 m)   Wt 212 lb 3.2 oz (96.3 kg)   SpO2 98%   BMI 28.78 kg/m  , BMI Body mass index is 28.78 kg/m. GENERAL:  Well appearing HEENT:  Pupils equal round and reactive, fundi not visualized, oral mucosa unremarkable NECK:  No jugular venous distention, waveform within normal limits, carotid upstroke brisk and symmetric, no bruits, no thyromegaly LYMPHATICS:  No cervical, inguinal adenopathy LUNGS:  Clear to auscultation bilaterally BACK:  No CVA tenderness CHEST:  Unremarkable HEART:  PMI not displaced or sustained,S1 and S2 within normal limits, no S3, no S4, no clicks, no rubs, no  murmurs ABD:  Flat, positive bowel sounds normal in frequency in pitch, no bruits, no rebound, no guarding, no midline pulsatile mass, no hepatomegaly, no splenomegaly EXT:  2 plus pulses throughout, no edema, no cyanosis no clubbing SKIN:  No rashes no nodules NEURO:  Cranial nerves II through XII grossly intact, motor grossly intact throughout PSYCH:  Cognitively intact, oriented to person place and time    EKG:        Recent Labs: 07/31/2022: TSH 0.837 08/01/2022: Magnesium 2.2 09/18/2022: ALT 22; BUN 22; Creatinine, Ser 1.40; Hemoglobin 12.7; Platelets 360; Potassium 3.4; Sodium 136    Lipid Panel    Component Value Date/Time   CHOL 161 03/09/2021 0700   TRIG 114 03/09/2021 0700   HDL  42 03/09/2021 0700   CHOLHDL 3.8 03/09/2021 0700   VLDL 23 03/09/2021 0700   LDLCALC 96 03/09/2021 0700      Wt Readings from Last 3 Encounters:  10/07/22 212 lb 3.2 oz (96.3 kg)  09/18/22 225 lb 12 oz (102.4 kg)  08/19/22 240 lb (108.9 kg)      Other studies Reviewed: Additional studies/ records that were reviewed today include: Stress test and echo. Review of the above records demonstrates:  Please see elsewhere in the note.     ASSESSMENT AND PLAN:  Chest pain: The patient has had no symptoms.  Negative perfusion study.  No further workup is suggested.  He can stop his Imdur  HTN: His blood pressure is controlled.  No change in  Preop: The patient is at acceptable risk for the planned surgery.  He has no high risk findings.  He has no high risk symptoms.  He has negative perfusion study.  AI: Echocardiogram demonstrated moderate aortic insufficiency and we could look at this again in a couple of years and I suggested 2-year follow-up.   Current medicines are reviewed at length with the patient today.  The patient does not have concerns regarding medicines.  The following changes have been made:  no change  Labs/ tests ordered today include: None No orders of the defined  types were placed in this encounter.    Disposition:   FU with me in 2 years.     Signed, Rollene Rotunda, MD  10/07/2022 9:33 AM     [facility]

## 2022-10-07 ENCOUNTER — Ambulatory Visit: Payer: Medicare Other | Attending: Cardiology | Admitting: Cardiology

## 2022-10-07 ENCOUNTER — Encounter: Payer: Self-pay | Admitting: Cardiology

## 2022-10-07 ENCOUNTER — Ambulatory Visit: Payer: Medicare Other | Admitting: Cardiology

## 2022-10-07 VITALS — BP 116/62 | HR 51 | Ht 72.0 in | Wt 212.2 lb

## 2022-10-07 DIAGNOSIS — Z01810 Encounter for preprocedural cardiovascular examination: Secondary | ICD-10-CM | POA: Diagnosis present

## 2022-10-07 NOTE — Patient Instructions (Signed)
    Follow-Up: At [facility], you and your health needs are our priority.  As part of our continuing mission to provide you with exceptional heart care, we have created designated Provider Care Teams.  These Care Teams include your primary Cardiologist (physician) and Advanced Practice Providers (APPs -  Physician Assistants and Nurse Practitioners) who all work together to provide you with the care you need, when you need it.  We recommend signing up for the patient portal called "MyChart".  Sign up information is provided on this After Visit Summary.  MyChart is used to connect with patients for Virtual Visits (Telemedicine).  Patients are able to view lab/test results, encounter notes, upcoming appointments, etc.  Non-urgent messages can be sent to your provider as well.   To learn more about what you can do with MyChart, go to [email or web address].    Your next appointment:   2 year(s)  Provider:   Rollene Rotunda MD

## 2022-10-08 NOTE — Progress Notes (Signed)
Patient has hx of dementia. Is is oriented to self and place. Doesn't know what surgery he is having done or the year. Daughter is POA and she signs consents for him. Daughter was present during appointment.  COVID Vaccine Completed: yes  Date of COVID positive in last 90 days: no  PCP - Tracey Harries, MD Cardiologist - Carolan Clines, MD  Cardiac clearance by Cathlean Marseilles, MD 10/07/22 in Epic  Chest x-ray - 07/31/22 Epic EKG - 09/18/22 Epic Stress Test - 08/19/22 Epic ECHO - 08/01/22 Epic Cardiac Cath - n/a Pacemaker/ICD device last checked: n/a Spinal Cord Stimulator: n/a  Bowel Prep - no  Sleep Study - n/a CPAP -   Fasting Blood Sugar -  Checks Blood Sugar no checks or meds at home  Last dose of GLP1 agonist-  N/A GLP1 instructions:  N/A   Last dose of SGLT-2 inhibitors-  N/A SGLT-2 instructions: N/A   Blood Thinner Instructions:  Time Aspirin Instructions: n/a Last Dose:  Activity level: Can perform activities of daily living without stopping and without symptoms of chest pain or shortness of breath. Up with walker for short distances, using wheelchair otherwise  Anesthesia review: near syncope, HTN, dementia, DM2  Patient denies shortness of breath, fever, cough and chest pain at PAT appointment  Patient's daughter verbalized understanding of instructions that were given to them at the PAT appointment. Daughter was also instructed that they will need to review over the PAT instructions again at home before surgery.

## 2022-10-08 NOTE — Patient Instructions (Addendum)
SURGICAL WAITING ROOM VISITATION  Patients having surgery or a procedure may have no more than 2 support people in the waiting area - these visitors may rotate.    Children under the age of 22 must have an adult with them who is not the patient.  Due to an increase in RSV and influenza rates and associated hospitalizations, children ages 32 and under may not visit patients in [facility] hospitals.  If the patient needs to stay at the hospital during part of their recovery, the visitor guidelines for inpatient rooms apply. Pre-op nurse will coordinate an appropriate time for 1 support person to accompany patient in pre-op.  This support person may not rotate.    Please refer to the [facility] website for the visitor guidelines for Inpatients (after your surgery is over and you are in a regular room).    Your procedure is scheduled on: 10/22/22   Report to [facility] Main Entrance    Report to admitting at 5:15 AM   Call this number if you have problems the morning of surgery [phone]   Do not eat food or drink liquids :After Midnight.          If you have questions, please contact your surgeon's office.   FOLLOW BOWEL PREP AND ANY ADDITIONAL PRE OP INSTRUCTIONS YOU RECEIVED FROM YOUR SURGEON'S OFFICE!!!     Oral Hygiene is also important to reduce your risk of infection.                                    Remember - BRUSH YOUR TEETH THE MORNING OF SURGERY WITH YOUR REGULAR TOOTHPASTE  DENTURES WILL BE REMOVED PRIOR TO SURGERY PLEASE DO NOT APPLY "Poly grip" OR ADHESIVES!!!   Stop all vitamins and herbal supplements 7 days before surgery.   Take these medicines the morning of surgery with A SIP OF WATER: Tylenol, Keflex, Lexapro, finasteride, Pantoprazole, Pravastatin   DO NOT TAKE ANY ORAL DIABETIC MEDICATIONS DAY OF YOUR SURGERY  How to Manage Your Diabetes Before and After Surgery  Why is it important to control my blood sugar before and after  surgery? Improving blood sugar levels before and after surgery helps healing and can limit problems. A way of improving blood sugar control is eating a healthy diet by:  Eating less sugar and carbohydrates  Increasing activity/exercise  Talking with your doctor about reaching your blood sugar goals High blood sugars (greater than 180 mg/dL) can raise your risk of infections and slow your recovery, so you will need to focus on controlling your diabetes during the weeks before surgery. Make sure that the doctor who takes care of your diabetes knows about your planned surgery including the date and location.  How do I manage my blood sugar before surgery? Check your blood sugar at least 4 times a day, starting 2 days before surgery, to make sure that the level is not too high or low. Check your blood sugar the morning of your surgery when you wake up and every 2 hours until you get to the Short Stay unit. If your blood sugar is less than 70 mg/dL, you will need to treat for low blood sugar: Do not take insulin. Treat a low blood sugar (less than 70 mg/dL) with  cup of clear juice (cranberry or apple), 4 glucose tablets, OR glucose gel. Recheck blood sugar in 15 minutes after treatment (to make  sure it is greater than 70 mg/dL). If your blood sugar is not greater than 70 mg/dL on recheck, call [phone] for further instructions. Report your blood sugar to the short stay nurse when you get to Short Stay.  If you are admitted to the hospital after surgery: Your blood sugar will be checked by the staff and you will probably be given insulin after surgery (instead of oral diabetes medicines) to make sure you have good blood sugar levels. The goal for blood sugar control after surgery is 80-180 mg/dL.   Reviewed and Endorsed by [facility] Patient Education Committee, August 2015                              You may not have any metal on your body including jewelry, and body piercing              Do not wear lotions, powders, cologne, or deodorant              Men may shave face and neck.   Do not bring valuables to the hospital. [place] IS NOT             RESPONSIBLE   FOR VALUABLES.   Contacts, glasses, dentures or bridgework may not be worn into surgery.   Bring small overnight bag day of surgery.   DO NOT BRING YOUR HOME MEDICATIONS TO THE HOSPITAL. PHARMACY WILL DISPENSE MEDICATIONS LISTED ON YOUR MEDICATION LIST TO YOU DURING YOUR ADMISSION IN THE HOSPITAL!              Please read over the following fact sheets you were given: IF YOU HAVE QUESTIONS ABOUT YOUR PRE-OP INSTRUCTIONS PLEASE CALL [phone] Knight    If you received a COVID test during your pre-op visit  it is requested that you wear a mask when out in public, stay away from anyone that may not be feeling well and notify your surgeon if you develop symptoms. If you test positive for Covid or have been in contact with anyone that has tested positive in the last 10 days please notify you surgeon.   - Preparing for Surgery Before surgery, you can play an important role.  Because skin is not sterile, your skin needs to be as free of germs as possible.  You can reduce the number of germs on your skin by washing with CHG (chlorahexidine gluconate) soap before surgery.  CHG is an antiseptic cleaner which kills germs and bonds with the skin to continue killing germs even after washing. Please DO NOT use if you have an allergy to CHG or antibacterial soaps.  If your skin becomes reddened/irritated stop using the CHG and inform your nurse when you arrive at Short Stay. Do not shave (including legs and underarms) for at least 48 hours prior to the first CHG shower.  You may shave your face/neck.  Please follow these instructions carefully:  1.  Shower with CHG Soap the night before surgery and the  morning of surgery.  2.  If you choose to wash your hair, wash your hair first as usual with your normal   shampoo.  3.  After you shampoo, rinse your hair and body thoroughly to remove the shampoo.                             4.  Use CHG as you would  any other liquid soap.  You can apply chg directly to the skin and wash.  Gently with a scrungie or clean washcloth.  5.  Apply the CHG Soap to your body ONLY FROM THE NECK DOWN.   Do   not use on face/ open                           Wound or open sores. Avoid contact with eyes, ears mouth and   genitals (private parts).                       Wash face,  Genitals (private parts) with your normal soap.             6.  Wash thoroughly, paying special attention to the area where your    surgery  will be performed.  7.  Thoroughly rinse your body with warm water from the neck down.  8.  DO NOT shower/wash with your normal soap after using and rinsing off the CHG Soap.                9.  Pat yourself dry with a clean towel.            10.  Wear clean pajamas.            11.  Place clean sheets on your bed the night of your first shower and do not  sleep with pets. Day of Surgery : Do not apply any lotions/deodorants the morning of surgery.  Please wear clean clothes to the hospital/surgery center.  FAILURE TO FOLLOW THESE INSTRUCTIONS MAY RESULT IN THE CANCELLATION OF YOUR SURGERY  PATIENT SIGNATURE_________________________________  NURSE SIGNATURE__________________________________  ________________________________________________________________________      Matthew Knight  An incentive spirometer is a tool that can help keep your lungs clear and active. This tool measures how well you are filling your lungs with each breath. Taking long deep breaths may help reverse or decrease the chance of developing breathing (pulmonary) problems (especially infection) following: A long period of time when you are unable to move or be active. BEFORE THE PROCEDURE  If the spirometer includes an indicator to show your best effort, your nurse or respiratory  therapist will set it to a desired goal. If possible, sit up straight or lean slightly forward. Try not to slouch. Hold the incentive spirometer in an upright position. INSTRUCTIONS FOR USE  Sit on the edge of your bed if possible, or sit up as far as you can in bed or on a chair. Hold the incentive spirometer in an upright position. Breathe out normally. Place the mouthpiece in your mouth and seal your lips tightly around it. Breathe in slowly and as deeply as possible, raising the piston or the ball toward the top of the column. Hold your breath for 3-5 seconds or for as long as possible. Allow the piston or ball to fall to the bottom of the column. Remove the mouthpiece from your mouth and breathe out normally. Rest for a few seconds and repeat Steps 1 through 7 at least 10 times every 1-2 hours when you are awake. Take your time and take a few normal breaths between deep breaths. The spirometer may include an indicator to show your best effort. Use the indicator as a goal to work toward during each repetition. After each set of 10 deep breaths, practice coughing to be sure your lungs are clear. If  you have an incision (the cut made at the time of surgery), support your incision when coughing by placing a pillow or rolled up towels firmly against it. Once you are able to get out of bed, walk around indoors and cough well. You may stop using the incentive spirometer when instructed by your caregiver.  RISKS AND COMPLICATIONS Take your time so you do not get dizzy or light-headed. If you are in pain, you may need to take or ask for pain medication before doing incentive spirometry. It is harder to take a deep breath if you are having pain. AFTER USE Rest and breathe slowly and easily. It can be helpful to keep track of a log of your progress. Your caregiver can provide you with a simple table to help with this. If you are using the spirometer at home, follow these instructions: SEEK MEDICAL  CARE IF:  You are having difficultly using the spirometer. You have trouble using the spirometer as often as instructed. Your pain medication is not giving enough relief while using the spirometer. You develop fever of 100.5 F (38.1 C) or higher. SEEK IMMEDIATE MEDICAL CARE IF:  You cough up bloody sputum that had not been present before. You develop fever of 102 F (38.9 C) or greater. You develop worsening pain at or near the incision site. MAKE SURE YOU:  Understand these instructions. Will watch your condition. Will get help right away if you are not doing well or get worse. Document Released: 07/05/2006 Document Revised: 05/17/2011 Document Reviewed: 09/05/2006 [facility] Patient Information 2014 [place], [place].   ________________________________________________________________________

## 2022-10-11 ENCOUNTER — Encounter (HOSPITAL_COMMUNITY)
Admission: RE | Admit: 2022-10-11 | Discharge: 2022-10-11 | Disposition: A | Discharge status: Home / Self Care | Payer: Medicare Other | Source: Ambulatory Visit | Attending: Urology | Admitting: Urology

## 2022-10-11 ENCOUNTER — Encounter (HOSPITAL_COMMUNITY): Payer: Self-pay

## 2022-10-11 ENCOUNTER — Other Ambulatory Visit: Payer: Self-pay

## 2022-10-11 VITALS — BP 148/57 | HR 56 | Temp 98.0°F | Resp 16 | Ht 72.0 in | Wt 212.3 lb

## 2022-10-11 DIAGNOSIS — Z01812 Encounter for preprocedural laboratory examination: Secondary | ICD-10-CM | POA: Insufficient documentation

## 2022-10-11 DIAGNOSIS — E119 Type 2 diabetes mellitus without complications: Secondary | ICD-10-CM | POA: Insufficient documentation

## 2022-10-11 DIAGNOSIS — I10 Essential (primary) hypertension: Secondary | ICD-10-CM | POA: Insufficient documentation

## 2022-10-11 LAB — BASIC METABOLIC PANEL
Anion gap: 11 (ref 5–15)
BUN: 16 mg/dL (ref 8–23)
CO2: 22 mmol/L (ref 22–32)
Calcium: 9.2 mg/dL (ref 8.9–10.3)
Chloride: 106 mmol/L (ref 98–111)
Creatinine, Ser: 1.05 mg/dL (ref 0.61–1.24)
GFR, Estimated: >60 mL/min (ref >60)
Glucose, Bld: 96 mg/dL (ref 70–99)
Potassium: 4 mmol/L (ref 3.5–5.1)
Sodium: 139 mmol/L (ref 135–145)

## 2022-10-11 LAB — CBC
HCT: 42.2 % (ref 39.0–52.0)
Hemoglobin: 13.1 g/dL (ref 13.0–17.0)
MCH: 26 pg (ref 26.0–34.0)
MCHC: 31 g/dL (ref 30.0–36.0)
MCV: 83.9 fL (ref 80.0–100.0)
Platelets: 317 10*3/uL (ref 150–400)
RBC: 5.03 MIL/uL (ref 4.22–5.81)
RDW: 14.1 % (ref 11.5–15.5)
WBC: 8.9 10*3/uL (ref 4.0–10.5)
nRBC: 0 % (ref 0.0–0.2)

## 2022-10-11 LAB — HEMOGLOBIN A1C
Hgb A1c MFr Bld: 5.8 % — ABNORMAL HIGH (ref 4.8–5.6)
Mean Plasma Glucose: 119.76 mg/dL

## 2022-10-11 LAB — GLUCOSE, CAPILLARY: Glucose-Capillary: 99 mg/dL (ref 70–99)

## 2022-10-15 ENCOUNTER — Encounter (HOSPITAL_COMMUNITY): Payer: Self-pay

## 2022-10-15 NOTE — Anesthesia Preprocedure Evaluation (Addendum)
Anesthesia Evaluation  Patient identified by MRN, date of birth, ID band Patient awake    Reviewed: Allergy & Precautions, H&P , NPO status , Patient's Chart, lab work & pertinent test results  Airway Mallampati: II  TM Distance: >3 FB Neck ROM: Full    Dental no notable dental hx.    Pulmonary Patient abstained from smoking., former smoker   Pulmonary exam normal breath sounds clear to auscultation       Cardiovascular hypertension, Normal cardiovascular exam Rhythm:Regular Rate:Normal     Neuro/Psych   Anxiety Depression   Dementia CVA  negative psych ROS   GI/Hepatic Neg liver ROS,GERD  ,,  Endo/Other  diabetes    Renal/GU negative Renal ROS   BPH    Musculoskeletal  (+) Arthritis ,    Abdominal   Peds negative pediatric ROS (+)  Hematology   Anesthesia Other Findings   Reproductive/Obstetrics negative OB ROS                             Anesthesia Physical Anesthesia Plan  ASA: 3  Anesthesia Plan: General   Post-op Pain Management:    Induction: Intravenous  PONV Risk Score and Plan: Ondansetron, Dexamethasone and Treatment may vary due to age or medical condition  Airway Management Planned: Oral ETT  Additional Equipment:   Intra-op Plan:   Post-operative Plan: Extubation in OR  Informed Consent: I have reviewed the patients History and Physical, chart, labs and discussed the procedure including the risks, benefits and alternatives for the proposed anesthesia with the patient or authorized representative who has indicated his/her understanding and acceptance.     Dental advisory given  Plan Discussed with: CRNA  Anesthesia Plan Comments: (82 year old male who presents to PAT prior to surgery above.  Past medical history significant for former smoking, BPH with chronic indwelling foley, pre-diabetes, severe dementia, history of CVA, GERD, arthritis and anxiety. His  daughter is POA   Echo 08/01/22:   IMPRESSIONS     1. Left ventricular ejection fraction, by estimation, is 60 to 65%. The  left ventricle has normal function. The left ventricle has no regional  wall motion abnormalities. Left ventricular diastolic parameters were  normal.   2. Right ventricular systolic function is normal. The right ventricular  size is normal.   3. Left atrial size was moderately dilated.   4. The mitral valve is abnormal. Trivial mitral valve regurgitation. No  evidence of mitral stenosis.   5. The aortic valve is tricuspid. There is mild calcification of the  aortic valve. There is mild thickening of the aortic valve. Aortic valve  regurgitation is moderate. Aortic valve sclerosis is present, with no  evidence of aortic valve stenosis.   6. The inferior vena cava is normal in size with greater than 50%  respiratory variability, suggesting right atrial pressure of 3 mmHg.   7. Agitated saline contrast bubble study was negative, with no evidence  of any interatrial shunt.   )        Anesthesia Quick Evaluation

## 2022-10-15 NOTE — Progress Notes (Signed)
Case: [id number] Date/Time: 10/22/22 0715   Procedure: TRANSURETHRAL WATERJET ABLATION OF PROSTATE - 90 MINS FOR CASE   Anesthesia type: General   Pre-op diagnosis: BENIGN PROSTATE HYPERPLASIA RETENTION   Location: [facility] PROCEDURE [facility] / [facility]   Surgeons: Jerilee Field, MD       DISCUSSION: Matthew Knight is an 82 year old male who presents to PAT prior to surgery above.  Past medical history significant for former smoking, BPH with chronic indwelling foley, pre-diabetes, severe dementia, history of CVA, GERD, arthritis and anxiety. His daughter is POA  No prior anesthesia complications  Other PMH includes recent presyncopal episode for which he was admitted on 07/31/22-08/01/22. He was found to be altered by facility staff and heart rate was in the 40s. He was given IVF with subsequent improvement in his mental status. Cardiology was consulted who recommended updated Echo and to monitor him on telemetry. Updated echo showed moderately dilated LA, trivial MR, thickening of aortic valve with moderate AR. Cardiology recommended outpatient 30-day monitoring and follow-up.   Prior surgery date was rescheduled due to cardiac clearance being requested. He saw Dr. Wyline Mood on 08/17/22 and had a stress test done which was low risk. Per Dr. Antoine Poche at follow up visit on 10/07/22: "The patient is at acceptable risk for the planned surgery. He has no high risk findings. He has no high risk symptoms. He has negative perfusion study." They were advised to f/u in 2 years for repeat echo due to moderate AI.  In the meantime patient presented to the ED on 09/18/22 for obstructed foley catheter. It was replaced and he was treated for complicated UTI with Keflex. Vitals were reassuring at PAT visit and patient was at baseline. WBC normal   VS: BP (!) 148/57   Pulse (!) 56   Temp 36.7 C (Oral)   Resp 16   Ht 6' (1.829 m)   Wt 96.3 kg   SpO2 97%   BMI 28.79 kg/m   PROVIDERS: Tracey Harries, MD   LABS:  Labs reviewed: Acceptable for surgery. (all labs ordered are listed, but only abnormal results are displayed)  Labs Reviewed  HEMOGLOBIN A1C - Abnormal; Notable for the following components:      Result Value   Hgb A1c MFr Bld 5.8 (*)    All other components within normal limits  BASIC METABOLIC PANEL  CBC  GLUCOSE, CAPILLARY     IMAGES:   EKG 09/18/22  NSR, rate 90 No significant change from prior   CV:  NM Stress test 08/19/22:   A pharmacological stress test was performed using IV Lexiscan 0.4mg  over 10 seconds performed without concurrent submaximal exercise. The patient reported dyspnea during the stress test. Onset of symptoms occurred at stage 0.5 of the protocol. Symptoms began at minute 30 sec during stress and ended at minute 4 during recovery. Normal blood pressure and normal heart rate response noted during stress. Heart rate recovery was normal.   No ST deviation was noted. The ECG was not diagnostic due to pharmacologic protocol.   LV perfusion is normal. There is no evidence of ischemia. There is no evidence of infarction.   Left ventricular function is normal. Nuclear stress EF: 57 %. End diastolic cavity size is normal. End systolic cavity size is normal. No evidence of transient ischemic dilation (TID) noted.   Prior study not available for comparison.   The study is normal. The study is low risk.  Echo 08/01/22:  IMPRESSIONS     1. Left ventricular  ejection fraction, by estimation, is 60 to 65%. The  left ventricle has normal function. The left ventricle has no regional  wall motion abnormalities. Left ventricular diastolic parameters were  normal.   2. Right ventricular systolic function is normal. The right ventricular  size is normal.   3. Left atrial size was moderately dilated.   4. The mitral valve is abnormal. Trivial mitral valve regurgitation. No  evidence of mitral stenosis.   5. The aortic valve is tricuspid. There is mild calcification of  the  aortic valve. There is mild thickening of the aortic valve. Aortic valve  regurgitation is moderate. Aortic valve sclerosis is present, with no  evidence of aortic valve stenosis.   6. The inferior vena cava is normal in size with greater than 50%  respiratory variability, suggesting right atrial pressure of 3 mmHg.   7. Agitated saline contrast bubble study was negative, with no evidence  of any interatrial shunt.   Carotid US 03/09/21:  Summary:  Right Carotid: Velocities in the right ICA are consistent with a 1-39%  stenosis.   Left Carotid: Velocities in the left ICA are consistent with a 1-39%  stenosis.   Vertebrals: Bilateral vertebral arteries demonstrate antegrade flow.  Subclavians: Left subclavian artery was stenotic. Normal flow hemodynamics  were              seen in the right subclavian artery.   Past Medical History:  Diagnosis Date   Acute blood loss anemia    Anxiety    Arthritis    Dementia (HCC)    Diabetes mellitus without complication (HCC)    Fall at home, initial encounter 03/19/2021   GERD (gastroesophageal reflux disease)    Hearing loss    Hepatitis    Hypertension    Pneumonia    Screening for colon cancer 12/03/2013   Small vessel disease (HCC)    Stroke [facility])     Past Surgical History:  Procedure Laterality Date   APPENDECTOMY     BIOPSY  07/08/2021   Procedure: BIOPSY;  Surgeon: Charlott Rakes, MD;  Location: [facility] ENDOSCOPY;  Service: Gastroenterology;;   COLONOSCOPY WITH PROPOFOL N/A 03/24/2021   Procedure: COLONOSCOPY WITH PROPOFOL;  Surgeon: Napoleon Form, MD;  Location: [facility] ENDOSCOPY;  Service: Endoscopy;  Laterality: N/A;   COLONOSCOPY WITH PROPOFOL N/A 07/08/2021   Procedure: COLONOSCOPY WITH PROPOFOL;  Surgeon: Charlott Rakes, MD;  Location: [facility] ENDOSCOPY;  Service: Gastroenterology;  Laterality: N/A;   ESOPHAGOGASTRODUODENOSCOPY (EGD) WITH PROPOFOL N/A 03/24/2021   Procedure: ESOPHAGOGASTRODUODENOSCOPY (EGD) WITH  PROPOFOL;  Surgeon: Napoleon Form, MD;  Location: [facility] ENDOSCOPY;  Service: Endoscopy;  Laterality: N/A;   FRACTURE SURGERY Left    Shoulder ORIF   GIVENS CAPSULE STUDY N/A 07/05/2021   Procedure: GIVENS CAPSULE STUDY;  Surgeon: Charlott Rakes, MD;  Location: [facility] ENDOSCOPY;  Service: Gastroenterology;  Laterality: N/A;   HEMOSTASIS CLIP PLACEMENT  03/24/2021   Procedure: HEMOSTASIS CLIP PLACEMENT;  Surgeon: Napoleon Form, MD;  Location: [facility] ENDOSCOPY;  Service: Endoscopy;;   HOT HEMOSTASIS N/A 03/24/2021   Procedure: HOT HEMOSTASIS (ARGON PLASMA COAGULATION/BICAP);  Surgeon: Napoleon Form, MD;  Location: [facility] ENDOSCOPY;  Service: Endoscopy;  Laterality: N/A;   HOT HEMOSTASIS N/A 07/08/2021   Procedure: HOT HEMOSTASIS (ARGON PLASMA COAGULATION/BICAP);  Surgeon: Charlott Rakes, MD;  Location: [facility] ENDOSCOPY;  Service: Gastroenterology;  Laterality: N/A;   POLYPECTOMY  03/24/2021   Procedure: POLYPECTOMY;  Surgeon: Napoleon Form, MD;  Location: [facility] ENDOSCOPY;  Service: Endoscopy;;  POLYPECTOMY  07/08/2021   Procedure: POLYPECTOMY;  Surgeon: Charlott Rakes, MD;  Location: [facility] ENDOSCOPY;  Service: Gastroenterology;;   SURGERY SCROTAL / TESTICULAR     ? side, fixed testicular torsion    MEDICATIONS:  acetaminophen (TYLENOL) 325 MG tablet   cephALEXin (KEFLEX) 500 MG capsule   Cholecalciferol (VITAMIN D3) 1000 units CAPS   docusate sodium (COLACE) 100 MG capsule   escitalopram (LEXAPRO) 20 MG tablet   finasteride (PROSCAR) 5 MG tablet   latanoprost (XALATAN) 0.005 % ophthalmic solution   lisinopril (ZESTRIL) 10 MG tablet   pantoprazole (PROTONIX) 40 MG tablet   polyethylene glycol (MIRALAX) 17 g packet   pravastatin (PRAVACHOL) 20 MG tablet   Prenatal Vit-Fe Fumarate-FA (PNV FOLIC ACID + IRON PO)   RHOPRESSA 0.02 % SOLN   silodosin (RAPAFLO) 8 MG CAPS capsule   No current facility-administered medications for this encounter.   Marcille Blanco [facility]  Short Stay/Anesthesiology [facility] Phone [phone] 10/15/2022 11:33 AM

## 2022-10-21 NOTE — H&P (Signed)
Office Visit Report     09/29/2022   --------------------------------------------------------------------------------   Matthew Knight  MRN: [id number]  DOB: [birth year], 82 year old Male  SSN:-[id number]   PRIMARY CARE:  Francisco M. Yetta Flock, MD  PRIMARY CARE FAX:  [phone]  REFERRING:  Doyne Keel, MD  PROVIDER:  Marcine Matar, M.D.  TREATING:  Bartholomew Crews, NP  LOCATION:  [facility] [phone]     --------------------------------------------------------------------------------   CC/HPI: 4.24.2023: Here for followup of BPH w/ retention--during hospitalization for TIA. He was placed on alfuzosin and passed a TOV--recomended f/u prn. Since that time he has been on silodosin.   1.8.2024: Was admitted to the hospital on the third of this month with pneumonia and was discovered to be in urinary retention. Prior to to his illness, he was not having urinary issues. He comes in to the office today with Foley in place. Still on silodosin, he takes it in the morning. No recent issues with constipation.   04/12/22: He presents today after he was found to be in urinary retention and had gross hematuria one week after passing a trial of void in our office. He also had an AKI. He lives in an assisted living facility and currently has a catheter. He continues on silodosin as well as Proscar. he had a renal US in the hospital and a negative urine culture.   06/18/2022: Patient was seen in the hospital as Foley dropped down into his prostate. CT pelvis with prostate measuring about 125 g. Length was 7 to 8 cm. I do think the finasteride is shrunk his prostate a bit. He is on silodosin and finasteride. He was career [employer]. He has dementia and is not tolerating the catheter or catheter changes. Cystoscopy today with large lateral lobes and some intravesical extension. He scream the whole time we did the cystoscopy. He did void some but not all. We talked about leaving the  Foley out but she elected to replace it. It is later on Friday afternoon. She would really like to maximize his voiding chances and after carefully considering the nature risk benefits and alternatives to aqua ablation she wants to proceed. I did offer another voiding trial opportunity 1 morning. But she declined.    09/29/2022: Matthew Knight is scheduled to undergo aqua ablation on 10/22/2022. Per his caregiver, he is having a rough day today and is sleepy and grumpy. He was seen in the emergency department recently and started on an antibiotic for UTIs. They are very anxious to proceed with next steps and aqua ablation to see if they can get him catheter free. His caregiver reports that he has been doing well with the catheter over the last week and has been draining clear yellow urine free of blood and clots. He has had no other changes to his medications. He denies chest pain and shortness of breath.       ALLERGIES: Darunavir Sulfa Drugs    MEDICATIONS: Lisinopril 40 mg tablet  Omeprazole 40 mg capsule,delayed release  Proscar 5 mg tablet 1 tablet PO Daily  Acetaminophen 325 mg tablet  Escitalopram Oxalate 20 mg tablet  Exelon 4.6 mg/24 hour patch, transdermal 24 hours  Isosorbide Mononitrate Er 30 mg tablet, extended release 24 hr  Latanoprost 0.005 % drops  Meclizine Hcl 25 mg tablet  Pravastatin Sodium 20 mg tablet  Rhopressa 0.02 % drops  Silodosin 8 mg capsule 1 capsule PO Daily     GU PSH: Cystoscopy - 06/18/2022  PSH Notes: Tonsillectomy, Appendectomy   Shoulder surgery 2018   NON-GU PSH: Appendectomy - 2011 Remove Tonsils - 2011     GU PMH: BPH w/LUTS, I discussed with the patient and his daughter the nature r/b/a to Aquablation of prostate including side effects of the procedure, expected post-op course and likelihood of success. We discussed flow symptoms and irritative symptoms typically improve, but frequency and urgency can persist and rarely worsen. We  also discussed risk of bleeding, infection, stricture, sexual dysfunction and incontinence among others. We also discussed expectations for a staged or repeat procedure in the future. Given his age and comorbidities it be wise to just keep him on finasteride. He is not sexually active. Also was discussed due to his dementia and immobility he may still fail a voiding trial after surgery. All questions answered. She elects to proceed. - 06/18/2022, - 04/12/2022, BPH with recurrent retention, currently he is voiding fairly well. He will on silodosin., - 03/15/2022, - 06/29/2021 Urinary Retention - 06/18/2022, - 04/12/2022, BPH with history of retention, now voiding fairly well., - 06/29/2021, - 04/16/2021, - 2023 Incomplete bladder emptying - 03/15/2022, - 06/29/2021 BPH w/o LUTS - 04/16/2021, Benign prostatic hypertrophy without lower urinary tract symptoms, - 2014 Elevated PSA, Elevated prostate specific antigen (PSA) - 2014      PMH Notes:  1898-03-08 00:00:00 - Note: Normal Routine History And Physical Senior Citizen [phone])  2009-04-10 10:16:12 - Note: Pneumonia   NON-GU PMH: Personal history of other diseases of the digestive system, History of esophageal reflux - 2014 Personal history of other infectious and parasitic diseases, History of hepatitis - 2014 Anxiety Diabetes Type 2 GERD Glaucoma Hypercholesterolemia Hypertension Inflammatory liver disease, unspecified Stroke/TIA    FAMILY HISTORY: Death In The Family Father - Runs In Family Death In The Family Mother - Runs In Family Family Health Status Number - Runs In Family Heart Disease - Mother, Father Kidney Cancer - Runs in Family Kidney Stones - Runs in Family renal failure - Grandfather   SOCIAL HISTORY: Marital Status: Widowed     Notes: Caffeine Use, Tobacco Use, Marital History - Currently Married, Retired From Work, Alcohol Use   REVIEW OF SYSTEMS:    GU Review Male:   Patient denies frequent urination, hard to postpone urination,  burning/ pain with urination, get up at night to urinate, leakage of urine, stream starts and stops, trouble starting your stream, have to strain to urinate , erection problems, and penile pain.  Gastrointestinal (Upper):   Patient denies nausea, vomiting, and indigestion/ heartburn.  Gastrointestinal (Lower):   Patient denies diarrhea and constipation.  Constitutional:   Patient reports fatigue. Patient denies fever, night sweats, and weight loss.  Cardiovascular:   Patient denies leg swelling and chest pains.  Respiratory:   Patient denies cough and shortness of breath.  Musculoskeletal:   Patient denies back pain and joint pain.  Neurological:   Patient denies headaches and dizziness.  Psychologic:   Patient denies depression and anxiety.   VITAL SIGNS:      09/29/2022 10:14 AM  BP 168/69 mmHg  Pulse 54 /min  Temperature 97.1 F / 36.1 C   MULTI-SYSTEM PHYSICAL EXAMINATION:    Constitutional: Well-nourished. No physical deformities. Normally developed. Good grooming.  Neck: Neck symmetrical, not swollen. Normal tracheal position.   Respiratory: No labored breathing, no use of accessory muscles.   Cardiovascular: Regular rate and rhythm. No murmur, no gallop. Normal temperature, normal extremity pulses, no swelling, no varicosities.   Lymphatic: No enlargement  of neck, axillae, groin.  Skin: No paleness, no jaundice, no cyanosis. No lesion, no ulcer, no rash.  Neurologic / Psychiatric: Oriented to time, oriented to place, oriented to person. No depression, no anxiety, no agitation.  Gastrointestinal: No mass, no tenderness, no rigidity, non obese abdomen.  Eyes: Normal conjunctivae. Normal eyelids.  Ears, Nose, Mouth, and Throat: Left ear no scars, no lesions, no masses. Right ear no scars, no lesions, no masses. Nose no scars, no lesions, no masses. Normal hearing. Normal lips.  Musculoskeletal: Normal gait and station of head and neck.     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           Document Letter(s):  Created for Patient: Clinical Summary         Notes:   He will keep his upcoming procedure for surgery as scheduled. Notify the office should he develop any fevers, chills or concerns in the interval.        Next Appointment:      Next Appointment: 10/22/2022 07:30 AM    Appointment Type: Surgery     Location: [facility] [phone] [id number]    Provider: Jerilee Field, M.D.    Reason for Visit: OBS [facility] AQUABLATION    ** Signed by Bartholomew Crews, NP on 09/29/22 at 4:56 PM (EDT)**      The information contained in this medical record document is considered private and confidential patient information. This information can only be used for the medical diagnosis and/or medical services that are being provided by the patient's selected caregivers. This information can only be distributed outside of the patient's care if the patient agrees and signs waivers of authorization for this information to be sent to an outside source or route.

## 2022-10-22 ENCOUNTER — Encounter (HOSPITAL_COMMUNITY): Payer: Self-pay | Admitting: Urology

## 2022-10-22 ENCOUNTER — Encounter (HOSPITAL_COMMUNITY)
Admission: RE | Disposition: A | Discharge status: Home / Self Care | Payer: Self-pay | Source: Ambulatory Visit | Attending: Urology

## 2022-10-22 ENCOUNTER — Observation Stay (HOSPITAL_COMMUNITY)
Admission: RE | Admit: 2022-10-22 | Discharge: 2022-10-23 | Disposition: A | Discharge status: Home / Self Care | Payer: Medicare Other | Source: Ambulatory Visit | Attending: Urology | Admitting: Urology

## 2022-10-22 ENCOUNTER — Ambulatory Visit (HOSPITAL_COMMUNITY): Payer: Medicare Other | Admitting: Medical

## 2022-10-22 ENCOUNTER — Ambulatory Visit (HOSPITAL_BASED_OUTPATIENT_CLINIC_OR_DEPARTMENT_OTHER): Payer: Medicare Other

## 2022-10-22 ENCOUNTER — Other Ambulatory Visit: Payer: Self-pay

## 2022-10-22 DIAGNOSIS — Z87891 Personal history of nicotine dependence: Secondary | ICD-10-CM | POA: Diagnosis not present

## 2022-10-22 DIAGNOSIS — F0390 Unspecified dementia without behavioral disturbance: Secondary | ICD-10-CM | POA: Diagnosis not present

## 2022-10-22 DIAGNOSIS — Z8673 Personal history of transient ischemic attack (TIA), and cerebral infarction without residual deficits: Secondary | ICD-10-CM | POA: Insufficient documentation

## 2022-10-22 DIAGNOSIS — R350 Frequency of micturition: Secondary | ICD-10-CM | POA: Diagnosis not present

## 2022-10-22 DIAGNOSIS — N401 Enlarged prostate with lower urinary tract symptoms: Principal | ICD-10-CM | POA: Diagnosis present

## 2022-10-22 DIAGNOSIS — R338 Other retention of urine: Secondary | ICD-10-CM | POA: Diagnosis not present

## 2022-10-22 DIAGNOSIS — R3912 Poor urinary stream: Secondary | ICD-10-CM

## 2022-10-22 DIAGNOSIS — I10 Essential (primary) hypertension: Secondary | ICD-10-CM | POA: Insufficient documentation

## 2022-10-22 DIAGNOSIS — N138 Other obstructive and reflux uropathy: Principal | ICD-10-CM | POA: Diagnosis present

## 2022-10-22 DIAGNOSIS — E119 Type 2 diabetes mellitus without complications: Secondary | ICD-10-CM | POA: Diagnosis not present

## 2022-10-22 DIAGNOSIS — Z79899 Other long term (current) drug therapy: Secondary | ICD-10-CM | POA: Diagnosis not present

## 2022-10-22 LAB — GLUCOSE, CAPILLARY
Glucose-Capillary: 95 mg/dL (ref 70–99)
Glucose-Capillary: 108 mg/dL — ABNORMAL HIGH (ref 70–99)

## 2022-10-22 SURGERY — ABLATION, PROSTATE, TRANSURETHRAL, USING WATERJET
Anesthesia: General

## 2022-10-22 MED ORDER — ROCURONIUM BROMIDE 10 MG/ML (PF) SYRINGE
PREFILLED_SYRINGE | INTRAVENOUS | Status: AC
  Filled 2022-10-22: qty 10

## 2022-10-22 MED ORDER — ORAL CARE MOUTH RINSE: 15.0000 mL | Freq: Once | OROMUCOSAL | Status: AC

## 2022-10-22 MED ORDER — ACETAMINOPHEN 10 MG/ML IV SOLN
1000.0000 mg | Freq: Once | INTRAVENOUS | Status: DC | PRN
  Administered 2022-10-22: 1000 mg via INTRAVENOUS

## 2022-10-22 MED ORDER — GLYCOPYRROLATE 0.2 MG/ML IJ SOLN
INTRAMUSCULAR | Status: DC | PRN
  Administered 2022-10-22 (×2): .1 mg via INTRAVENOUS

## 2022-10-22 MED ORDER — SODIUM CHLORIDE 0.9% FLUSH
3.0000 mL | Freq: Two times a day (BID) | INTRAVENOUS | Status: DC
  Administered 2022-10-22 – 2022-10-23 (×3): 3 mL via INTRAVENOUS

## 2022-10-22 MED ORDER — FENTANYL CITRATE PF 50 MCG/ML IJ SOSY
PREFILLED_SYRINGE | INTRAMUSCULAR | Status: AC
  Filled 2022-10-22: qty 1

## 2022-10-22 MED ORDER — OXYCODONE HCL 5 MG/5ML PO SOLN: 5.0000 mg | Freq: Once | ORAL | Status: DC | PRN

## 2022-10-22 MED ORDER — PANTOPRAZOLE SODIUM 40 MG PO TBEC
40.0000 mg | DELAYED_RELEASE_TABLET | Freq: Every day | ORAL | Status: DC
  Administered 2022-10-23: 40 mg via ORAL
  Filled 2022-10-22: qty 1

## 2022-10-22 MED ORDER — CHLORHEXIDINE GLUCONATE 0.12 % MT SOLN
15.0000 mL | Freq: Once | OROMUCOSAL | Status: AC
  Administered 2022-10-22: 15 mL via OROMUCOSAL

## 2022-10-22 MED ORDER — SUGAMMADEX SODIUM 200 MG/2ML IV SOLN
INTRAVENOUS | Status: DC | PRN
  Administered 2022-10-22: 200 mg via INTRAVENOUS

## 2022-10-22 MED ORDER — OXYCODONE HCL 5 MG PO TABS: 5.0000 mg | ORAL_TABLET | Freq: Once | ORAL | Status: DC | PRN

## 2022-10-22 MED ORDER — FENTANYL CITRATE (PF) 100 MCG/2ML IJ SOLN
INTRAMUSCULAR | Status: DC | PRN
  Administered 2022-10-22 (×4): 50 ug via INTRAVENOUS

## 2022-10-22 MED ORDER — ESCITALOPRAM OXALATE 20 MG PO TABS
20.0000 mg | ORAL_TABLET | Freq: Every day | ORAL | Status: DC
  Administered 2022-10-23: 20 mg via ORAL
  Filled 2022-10-22: qty 1

## 2022-10-22 MED ORDER — STERILE WATER FOR IRRIGATION IR SOLN
Status: DC | PRN
  Administered 2022-10-22: 500 mL

## 2022-10-22 MED ORDER — CHLORHEXIDINE GLUCONATE CLOTH 2 % EX PADS
6.0000 | MEDICATED_PAD | Freq: Every day | CUTANEOUS | Status: DC
  Administered 2022-10-23 (×2): 6 via TOPICAL

## 2022-10-22 MED ORDER — LACTATED RINGERS IV SOLN: INTRAVENOUS | Status: DC

## 2022-10-22 MED ORDER — TRANEXAMIC ACID-NACL 1000-0.7 MG/100ML-% IV SOLN
1000.0000 mg | INTRAVENOUS | Status: AC
  Administered 2022-10-22: 1000 mg via INTRAVENOUS
  Filled 2022-10-22: qty 100

## 2022-10-22 MED ORDER — NITROFURANTOIN MONOHYD MACRO 100 MG PO CAPS: 100.0000 mg | ORAL_CAPSULE | Freq: Every day | ORAL | 0 refills | Status: AC

## 2022-10-22 MED ORDER — OXYBUTYNIN CHLORIDE 5 MG PO TABS
5.0000 mg | ORAL_TABLET | Freq: Three times a day (TID) | ORAL | Status: DC | PRN
  Administered 2022-10-22 – 2022-10-23 (×3): 5 mg via ORAL
  Filled 2022-10-22 (×3): qty 1

## 2022-10-22 MED ORDER — FENTANYL CITRATE PF 50 MCG/ML IJ SOSY
PREFILLED_SYRINGE | INTRAMUSCULAR | Status: AC
  Filled 2022-10-22: qty 2

## 2022-10-22 MED ORDER — ONDANSETRON HCL 4 MG/2ML IJ SOLN
INTRAMUSCULAR | Status: AC
  Filled 2022-10-22: qty 2

## 2022-10-22 MED ORDER — 0.9 % SODIUM CHLORIDE (POUR BTL) OPTIME
TOPICAL | Status: DC | PRN
  Administered 2022-10-22: 1000 mL

## 2022-10-22 MED ORDER — SODIUM CHLORIDE 0.9% FLUSH: 3.0000 mL | INTRAVENOUS | Status: DC | PRN

## 2022-10-22 MED ORDER — FINASTERIDE 5 MG PO TABS
5.0000 mg | ORAL_TABLET | Freq: Every day | ORAL | Status: DC
  Administered 2022-10-23: 5 mg via ORAL
  Filled 2022-10-22: qty 1

## 2022-10-22 MED ORDER — ACETAMINOPHEN 325 MG PO TABS
650.0000 mg | ORAL_TABLET | ORAL | Status: DC | PRN
  Administered 2022-10-23: 650 mg via ORAL
  Filled 2022-10-22: qty 2

## 2022-10-22 MED ORDER — NETARSUDIL DIMESYLATE 0.02 % OP SOLN: 1.0000 [drp] | Freq: Every day | OPHTHALMIC | Status: DC

## 2022-10-22 MED ORDER — LIDOCAINE 2% (20 MG/ML) 5 ML SYRINGE
INTRAMUSCULAR | Status: DC | PRN
  Administered 2022-10-22: 100 mg via INTRAVENOUS

## 2022-10-22 MED ORDER — INSULIN ASPART 100 UNIT/ML IJ SOLN: 0.0000 [IU] | INTRAMUSCULAR | Status: DC | PRN

## 2022-10-22 MED ORDER — SODIUM CHLORIDE 0.9 % IV SOLN
2.0000 g | INTRAVENOUS | Status: AC
  Administered 2022-10-22: 2 g via INTRAVENOUS
  Filled 2022-10-22: qty 20

## 2022-10-22 MED ORDER — SODIUM CHLORIDE 0.9 % IR SOLN
Status: DC | PRN
  Administered 2022-10-22: 12000 mL

## 2022-10-22 MED ORDER — PRAVASTATIN SODIUM 20 MG PO TABS
20.0000 mg | ORAL_TABLET | Freq: Every day | ORAL | Status: DC
  Administered 2022-10-23: 20 mg via ORAL
  Filled 2022-10-22: qty 1

## 2022-10-22 MED ORDER — DEXAMETHASONE SODIUM PHOSPHATE 10 MG/ML IJ SOLN
INTRAMUSCULAR | Status: AC
  Filled 2022-10-22: qty 1

## 2022-10-22 MED ORDER — LIDOCAINE HCL (PF) 2 % IJ SOLN
INTRAMUSCULAR | Status: AC
  Filled 2022-10-22: qty 5

## 2022-10-22 MED ORDER — OXYCODONE HCL 5 MG PO TABS
5.0000 mg | ORAL_TABLET | ORAL | Status: DC | PRN
  Administered 2022-10-23 (×2): 5 mg via ORAL
  Filled 2022-10-22 (×3): qty 1

## 2022-10-22 MED ORDER — PROPOFOL 10 MG/ML IV BOLUS
INTRAVENOUS | Status: DC | PRN
  Administered 2022-10-22 (×2): 30 mg via INTRAVENOUS
  Administered 2022-10-22: 130 mg via INTRAVENOUS
  Administered 2022-10-22: 20 mg via INTRAVENOUS
  Administered 2022-10-22: 30 mg via INTRAVENOUS

## 2022-10-22 MED ORDER — VANCOMYCIN HCL IN DEXTROSE 1-5 GM/200ML-% IV SOLN
1000.0000 mg | INTRAVENOUS | Status: AC
  Administered 2022-10-22: 1000 mg via INTRAVENOUS
  Filled 2022-10-22: qty 200

## 2022-10-22 MED ORDER — LATANOPROST 0.005 % OP SOLN
1.0000 [drp] | Freq: Every day | OPHTHALMIC | Status: DC
  Administered 2022-10-22: 1 [drp] via OPHTHALMIC
  Filled 2022-10-22: qty 2.5

## 2022-10-22 MED ORDER — SODIUM CHLORIDE 0.9 % IV SOLN: 250.0000 mL | INTRAVENOUS | Status: DC | PRN

## 2022-10-22 MED ORDER — ONDANSETRON HCL 4 MG/2ML IJ SOLN
INTRAMUSCULAR | Status: DC | PRN
  Administered 2022-10-22: 4 mg via INTRAVENOUS

## 2022-10-22 MED ORDER — ROCURONIUM BROMIDE 10 MG/ML (PF) SYRINGE
PREFILLED_SYRINGE | INTRAVENOUS | Status: DC | PRN
  Administered 2022-10-22: 70 mg via INTRAVENOUS

## 2022-10-22 MED ORDER — DROPERIDOL 2.5 MG/ML IJ SOLN: 0.6250 mg | Freq: Once | INTRAMUSCULAR | Status: DC | PRN

## 2022-10-22 MED ORDER — FENTANYL CITRATE (PF) 100 MCG/2ML IJ SOLN
INTRAMUSCULAR | Status: AC
  Filled 2022-10-22: qty 2

## 2022-10-22 MED ORDER — SODIUM CHLORIDE 0.9 % IR SOLN
3000.0000 mL | Status: DC
  Administered 2022-10-22: 3000 mL

## 2022-10-22 MED ORDER — DOCUSATE SODIUM 100 MG PO CAPS
100.0000 mg | ORAL_CAPSULE | ORAL | Status: DC
  Administered 2022-10-23: 100 mg via ORAL
  Filled 2022-10-22: qty 1

## 2022-10-22 MED ORDER — LISINOPRIL 10 MG PO TABS
10.0000 mg | ORAL_TABLET | Freq: Every day | ORAL | Status: DC
  Administered 2022-10-23: 10 mg via ORAL
  Filled 2022-10-22: qty 1

## 2022-10-22 MED ORDER — FENTANYL CITRATE PF 50 MCG/ML IJ SOSY
25.0000 ug | PREFILLED_SYRINGE | INTRAMUSCULAR | Status: DC | PRN
  Administered 2022-10-22 (×3): 50 ug via INTRAVENOUS

## 2022-10-22 MED ORDER — ACETAMINOPHEN 10 MG/ML IV SOLN
INTRAVENOUS | Status: AC
  Filled 2022-10-22: qty 100

## 2022-10-22 MED ORDER — VANCOMYCIN HCL IN DEXTROSE 1-5 GM/200ML-% IV SOLN: 1000.0000 mg | Freq: Once | INTRAVENOUS | Status: DC

## 2022-10-22 SURGICAL SUPPLY — 27 items
BAG DRN RND TRDRP ANRFLXCHMBR (UROLOGICAL SUPPLIES) ×1
BAG URINE DRAIN 2000ML AR STRL (UROLOGICAL SUPPLIES) ×1 IMPLANT
CANISTER SUCT 3000ML PPV (MISCELLANEOUS) ×1 IMPLANT
CATH HEMA 3WAY 30CC 22FR COUDE (CATHETERS) IMPLANT
CATH HEMA 3WAY 30CC 24FR COUDE (CATHETERS) IMPLANT
COVER MAYO STAND STRL (DRAPES) ×1 IMPLANT
DRAPE FOOT SWITCH (DRAPES) ×1 IMPLANT
DRAPE SURG IRRIG POUCH 19X23 (DRAPES) IMPLANT
GEL ULTRASOUND 8.5O AQUASONIC (MISCELLANEOUS) ×1 IMPLANT
GLOVE SURG LX STRL 7.5 STRW (GLOVE) ×1 IMPLANT
GOWN STRL REUS W/ TWL XL LVL3 (GOWN DISPOSABLE) ×1 IMPLANT
GOWN STRL REUS W/TWL XL LVL3 (GOWN DISPOSABLE) ×1
HANDPIECE AQUABEAM (MISCELLANEOUS) ×1 IMPLANT
HOLDER FOLEY CATH W/STRAP (MISCELLANEOUS) IMPLANT
KIT TURNOVER KIT A (KITS) IMPLANT
LOOP CUT BIPOLAR 24F LRG (ELECTROSURGICAL) IMPLANT
MANIFOLD NEPTUNE II (INSTRUMENTS) ×1 IMPLANT
MAT ABSORB FLUID 56X50 GRAY (MISCELLANEOUS) ×1 IMPLANT
PACK CYSTO (CUSTOM PROCEDURE TRAY) ×1 IMPLANT
PACK DRAPE AQUABEAM (MISCELLANEOUS) ×1 IMPLANT
SYR 30ML LL (SYRINGE) ×1 IMPLANT
SYR TOOMEY IRRIG 70ML (MISCELLANEOUS) ×2
SYRINGE TOOMEY IRRIG 70ML (MISCELLANEOUS) ×2 IMPLANT
TOWEL OR 17X24 6PK STRL BLUE (TOWEL DISPOSABLE) ×1 IMPLANT
TUBING CONNECTING 10 (TUBING) ×2 IMPLANT
TUBING UROLOGY SET (TUBING) ×1 IMPLANT
UNDERPAD 30X36 HEAVY ABSORB (UNDERPADS AND DIAPERS) ×1 IMPLANT

## 2022-10-22 NOTE — Anesthesia Postprocedure Evaluation (Signed)
Anesthesia Post Note  Patient: Matthew Knight  Procedure(s) Performed: TRANSURETHRAL WATERJET ABLATION OF PROSTATE     Patient location during evaluation: PACU Anesthesia Type: General Level of consciousness: awake and alert Pain management: pain level controlled Vital Signs Assessment: post-procedure vital signs reviewed and stable Respiratory status: spontaneous breathing, nonlabored ventilation, respiratory function stable and patient connected to nasal cannula oxygen Cardiovascular status: blood pressure returned to baseline and stable Postop Assessment: no apparent nausea or vomiting Anesthetic complications: no   No notable events documented.  Last Vitals:  Vitals:   10/22/22 1300 10/22/22 1359  BP: (!) 159/69 (!) 128/56  Pulse: 79 77  Resp: 17 20  Temp:  (!) 36.3 C  SpO2: 91% 95%    Last Pain:  Vitals:   10/22/22 1359  TempSrc: Oral  PainSc:                  [place]

## 2022-10-22 NOTE — Anesthesia Procedure Notes (Signed)
Procedure Name: Intubation Date/Time: 10/22/2022 7:45 AM  Performed by: Caren Macadam, CRNAPre-anesthesia Checklist: Patient identified, Emergency Drugs available, Suction available and Patient being monitored Patient Re-evaluated:Patient Re-evaluated prior to induction Oxygen Delivery Method: Circle system utilized Preoxygenation: Pre-oxygenation with 100% oxygen Induction Type: IV induction Ventilation: Mask ventilation without difficulty Laryngoscope Size: Miller and 2 Grade View: Grade II Tube type: Oral Tube size: 7.5 mm Number of attempts: 1 Airway Equipment and Method: Stylet and Oral airway Placement Confirmation: ETT inserted through vocal cords under direct vision, positive ETCO2 and breath sounds checked- equal and bilateral Secured at: 22 cm Tube secured with: Tape Dental Injury: Teeth and Oropharynx as per pre-operative assessment

## 2022-10-22 NOTE — Op Note (Signed)
Preoperative diagnosis: BPH with lower urinary tract symptoms, weak stream, frequency  Postoperative diagnosis: Same   Procedure: Robotic water jet ablation of the prostate   Surgeon: Mena Goes   Anesthesia: General   Indication for procedure:   Findings:  EUA -penis circumcised without mass or lesion.  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  On DRE prostate was about 40 g and smooth without hard area or nodule.  Cystoscopy revealed lateral lobe hypertrophy and a high bladder neck with some intravesical extension of the prostate.  Post ablation and resection excellent channel.  Ureteral orifices identified and normal.  Description of procedure:  He was brought to the operating room and placed supine on the operating table.  After adequate anesthesia he was placed lithotomy position. Timeout was performed to confirm the patient and procedure. The TRUS Stepper was mounted to the Articulating Arm and secured to OR bed. The ultrasound probe was attached to the stepper. Exam under anesthesia was performed and the TRUS was inserted per rectum.  There was no resistance. The ultrasound probe was aligned, and confirmation made that the prostate is centered and aligned using both transverse and sagittal views. The bladder neck, verumontanum and the central/transition zones were identified.  Genitalia were prepped and draped in the usual sterile fashion. The 72F AQUABEAM Handpiece is inserted into the prostatic urethra and a complete cystoscopic evaluation was performed by inspecting the prostate, bladder, and identifying the location of the verumontanum/external sphincter. The AQUABEAM Handpiece was secured to the Handpiece Articulating Arm. Confirmed alignment of AQUABEAM Handpiece and TRUS Probe to be parallel and colinear. Confirmation that AQUABEAM nozzle is centered and anterior of the bladder neck or the median lobe. The cystoscope was then retracted to visualize the verumontanum and  external sphincter and the cystoscope tip was positioned just proximal to the external sphincter. Reconfirmed alignment of the TRUS probe with the AQUABEAM Handpiece and compression applied with TRUS probe. Horizontal alignment of the Handpiece waterjet nozzle was performed. The Aquablation treatment zones were planned utilizing real-time TRUS to visualize the contour of the prostate and the depth and radial angles of resection were defined in the transverse view. In the sagittal view, the AQUABEAM nozzle is identified and position registered with software. The treatment contours were then adjusted to conform to the intended resection margins. The median lobe, bladder neck and verumontanum were marked and confirmed in the treatment contour. The Aquablation Treatment was then started following the resection contour confirmed under ultrasound guidance.  TOTAL AQUABLATION RESECTION TIME: First pass 3 minutes 53 seconds, second pass 2-1/2 minutes.    Aquablation resection was complete the 24 French aqua beam handpiece was carefully removed.  The continuous-flow sheath with the visual obturator was passed and then the loop and handle.  The trigone and the ureteral orifices were identified. The TRUS was removed. No blood on the TRUS. Resection of some of the residual lateral lobe bladder neck tissue was done.  The bladder neck was identified at 6:00 and this was taken up to 12:00 with fulguration of the bladder neck and prostate for hemostasis.  Slight amount of anterior tissue was resected.  Similarly from 6:00 up to 12:00 on the left side of the bladder neck was identified by resecting some of the ablated tissue to identify the bladder neck and cauterize any bleeding.  Some anterior tissue on the left was resected.  This created excellent hemostasis.  All the chips were evacuated and sent for path.  Ureteral orifices again identified  and noted to be normal without injury.  The scope was backed out and a 24 [place]  hematuria catheter was placed with 30 cc in the balloon.  The balloon was seated at the bladder neck and it was irrigated on light traction and noted to be clear to pink.  He was hooked up to CBI.  He was cleaned up and placed supine.  Catheter was placed on traction.  He was awakened and taken to the cover room in stable condition.  Complications: None  Blood loss: 100 mL  Specimens: None  Drains: 24 [place] three-way hematuria catheter with 30 cc in the balloon  Disposition: Patient stable to PACU

## 2022-10-22 NOTE — Plan of Care (Signed)
  Problem: Education: Goal: Knowledge of General Education information will improve Description: Including pain rating scale, medication(s)/side effects and non-pharmacologic comfort measures Outcome: Progressing   Problem: Elimination: Goal: Will not experience complications related to urinary retention Outcome: Progressing   Problem: Safety: Goal: Ability to remain free from injury will improve Outcome: Progressing   Problem: Skin Integrity: Goal: Risk for impaired skin integrity will decrease Outcome: Progressing   

## 2022-10-22 NOTE — Discharge Instructions (Signed)
Transurethral Water Jet Ablation of Prostate, Care After  The following information offers guidance on how to care for yourself after your procedure. Your health care provider may also give you more specific instructions. If you have problems or questions, contact your health care provider. What can I expect after the procedure? After the procedure, it is common to have: Mild pain in your lower abdomen. Soreness or mild discomfort in your penis or when you urinate. This is from having the catheter inserted during the procedure. A sudden urge to urinate (urgency). A need to urinate often. A small amount of blood in your urine. You may notice some small blood clots in your urine. These are normal. Follow these instructions at home: Medicines Take over-the-counter and prescription medicines only as told by your health care provider. If you were prescribed an antibiotic medicine, take it as told by your health care provider. Do not stop taking the antibiotic even if you start to feel better. Activity  Rest as told by your health care provider. Avoid sitting for a long time without moving. Get up to take short walks every 1-2 hours. This is important to improve blood flow and breathing. Ask for help if you feel weak or unsteady. You may increase your physical activity gradually as you start to feel better. Do not drive or operate machinery until your health care provider says that it is safe. Do not ride in a car for long periods of time, or as told by your health care provider. Avoid intense physical activity for as long as told by your health care provider. Do not lift anything that is heavier than 10 lb (4.5 kg), or the limit that you are told, until your health care provider says that it is safe. Do not have sex until your health care provider approves. Return to your normal activities as told by your health care provider. Ask your health care provider what activities are safe for  you. Preventing constipation  You may need to take these actions to prevent or treat constipation: Drink enough fluid to keep your urine pale yellow. Take over-the-counter or prescription medicines. Eat foods that are high in fiber, such as beans, whole grains, and fresh fruits and vegetables. Limit foods that are high in fat and processed sugars, such as fried or sweet foods.   General instructions Do not strain when you have a bowel movement. Straining may lead to bleeding from the prostate. This may cause blood clots and trouble urinating. Do not use any products that contain nicotine or tobacco. These products include cigarettes, chewing tobacco, and vaping devices, such as e-cigarettes. If you need help quitting, ask your health care provider. If you go home with a tube draining your urine (urinary catheter), care for the catheter as told by your health care provider. Wear compression stockings as told by your health care provider. These stockings help to prevent blood clots and reduce swelling in your legs. Keep all follow-up visits. This is important. Contact a health care provider if: You have signs of infection, such as: Fever or chills. Urine that smells very bad. Swelling around your urethra that is getting worse. Swelling in your penis or testicles. You have difficulty urinating. You have pain that gets worse or does not improve with medicine. You have blood in your urine that does not go away after 1 week of resting and drinking more fluids. You have trouble having a bowel movement. You have trouble having or keeping an erection. No  semen comes out during orgasm (dry ejaculation). You have a urinary catheter in place, and you have: Spasms or pain. Problems with your catheter or your catheter is blocked. Get help right away if: You are unable to urinate. You are having more blood clots in your urine instead of fewer. You have: Large blood clots. A lot of blood in your  urine. Pain in your back or lower abdomen. You have difficulty breathing or shortness of breath. You develop swelling or pain in your leg. These symptoms may be an emergency. Get help right away. Call 911. Do not wait to see if the symptoms will go away. Do not drive yourself to the hospital. Summary After the procedure, it is common to have a small amount of blood in your urine. Follow restrictions about lifting and sexual activity as told by your health care provider. Ask what activities are safe for you. Keep all follow-up visits. This is important. This information is not intended to replace advice given to you by your health care provider. Make sure you discuss any questions you have with your health care provider. Document Revised: 11/18/2020 Document Reviewed: 11/18/2020 Elsevier Patient Education  2024 [employer].

## 2022-10-22 NOTE — TOC Initial Note (Signed)
Transition of Care [facility]) - Initial/Assessment Note    Patient Details  Name: Matthew Knight MRN: [id number] Date of Birth: 03-01-1941  Transition of Care [facility]) CM/SW Contact:    Lanier Clam, RN Phone Number: 10/22/2022, 3:28 PM  Clinical Narrative:  From [facility] per dtr Jerrell [place] own transport home.                 Expected Discharge Plan: Home/Self Care Barriers to Discharge: Continued Medical Work up   Patient Goals and CMS Choice Patient states their goals for this hospitalization and ongoing recovery are:: [facility] CMS Medicare.gov Compare Post Acute Care list provided to:: Patient Represenative (must comment) Choice offered to / list presented to : Adult Children [place] ownership interest in [facility].provided to:: Adult Children    Expected Discharge Plan and Services   Discharge Planning Services: CM Consult Post Acute Care Choice: Resumption of Svcs/PTA Provider Living arrangements for the past 2 months: Independent Living Facility                                      Prior Living Arrangements/Services Living arrangements for the past 2 months: Independent Living Facility Lives with:: Self   Do you feel safe going back to the place where you live?: Yes          Current home services: DME (rw)    Activities of Daily Living      Permission Sought/Granted Permission sought to share information with : Case Manager Permission granted to share information with : Yes, Verbal Permission Granted              Emotional Assessment              Admission diagnosis:  BPH with obstruction/lower urinary tract symptoms [N40.1, N13.8] Patient Active Problem List   Diagnosis Date Noted   BPH with obstruction/lower urinary tract symptoms 10/22/2022   Chronic indwelling Foley catheter 08/01/2022   Hypokalemia 03/23/2022   UTI (urinary tract infection) 03/23/2022   Obstructed, uropathy  03/23/2022   Community acquired pneumonia of left lower lobe of lung 03/10/2022   Acute respiratory failure with hypoxia (HCC) 03/10/2022   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   Rectal bleeding 07/02/2021   GIB (gastrointestinal bleeding) 07/01/2021   BPH (benign prostatic hyperplasia) 07/01/2021   HLD (hyperlipidemia) 07/01/2021   Iron deficiency anemia due to chronic blood loss    Symptomatic anemia    Platelet inhibition due to Plavix    Acute left-sided weakness 03/09/2021   Closed fracture distal radius and ulna, left, sequela 03/09/2021   History of TIA (transient ischemic attack) 03/09/2021   Snoring 04/28/2020   Anxiety 11/22/2018   Dementia without behavioral disturbance (HCC) 11/22/2018   Depression 11/22/2018   Exposure to Agent [facility] 11/22/2018   Small vessel disease, 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   Dizziness 10/24/2018   Benign essential HTN 11/11/2017   Sinus bradycardia 11/11/2017   Near syncope 11/10/2017   Onychomycosis 04/19/2017   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   Iliac artery aneurysm, 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 [place] [address] Phone: [phone] Fax: [phone]  [id number] - [address] [place] - [address] [facility] [address] [place] [address] Phone: [phone] Fax: [phone]     Social Determinants of Health (SDOH) Social History: SDOH Screenings   Food Insecurity: No Food Insecurity  (10/22/2022)  Housing: Low Risk  (10/22/2022)  Transportation Needs: Unmet Transportation Needs (03/29/2022)   Received from [facility], [facility]  Utilities: Not At Risk (03/29/2022)   Received from [facility], [facility]  Financial Resource Strain: Low Risk  (03/29/2022)   Received from [facility], [facility]  Physical Activity: Insufficiently Active (09/21/2021)   Received from [facility], [facility]  Social Connections: Unknown (09/24/2022)   Received from [facility]  Stress: No Stress Concern Present (09/21/2021)   Received from [facility], [facility]  Tobacco Use: Medium Risk (10/22/2022)   SDOH Interventions:     Readmission Risk Interventions     No data to display

## 2022-10-22 NOTE — Progress Notes (Addendum)
Pharmacy Antibiotic Note  Matthew Knight is a 82 y.o. male admitted on 10/22/2022 for an aqua ablation surgery and is in need of surgical prophylaxis. Pharmacy has been consulted for vancomycin dosing.  Plan: - Give vancomycin 1000mg  IV x1 prior to procedure  Height: 6' (182.9 cm) Weight: 96.3 kg (212 lb 4.9 oz) IBW/kg (Calculated) : 77.6  Temp (24hrs), Avg:97.9 F (36.6 C), Min:97.9 F (36.6 C), Max:97.9 F (36.6 C)  No results for input(s): "WBC", "CREATININE", "LATICACIDVEN", "VANCOTROUGH", "VANCOPEAK", "VANCORANDOM", "GENTTROUGH", "GENTPEAK", "GENTRANDOM", "TOBRATROUGH", "TOBRAPEAK", "TOBRARND", "AMIKACINPEAK", "AMIKACINTROU", "AMIKACIN" in the last 168 hours.  Estimated Creatinine Clearance: 66.4 mL/min (by C-G formula based on SCr of 1.05 mg/dL).    Allergies  Allergen Reactions   Aspirin Other (See Comments)    "BLEEDS OUT INTERNALLY"   Nsaids Other (See Comments)    NO ANTI-COAGULANTS, EITHER!!!! "BLEEDS OUT INTERNALLY" (ONLY Tylenol is tolerated)   Darunavir Other (See Comments)    Eruption of the skin   Rivastigmine Other (See Comments)    Brand name only for the patch- "otherwise, he will have a violent reaction"   Heparin Other (See Comments)    Pt requests NO ANTICOAGULANTS.  Has varices with hx of bleeding.   Sulfa Antibiotics Other (See Comments)    States this "made him crazy"      Thank you for allowing pharmacy to be a part of this patient's care.  Cherylin Mylar, PharmD Clinical Pharmacist  8/16/20246:10 AM

## 2022-10-22 NOTE — Progress Notes (Signed)
Matthew Knight is doing well in recovery.  He is off traction and urine is light pink on slow CBI, but he lives alone and has no one to stay with him tonight.  Vitals:   10/22/22 1145 10/22/22 1200  BP: (!) 164/74 (!) 166/85  Pulse: 95 100  Resp: 17 (!) 27  Temp:    SpO2: 93% 95%   He looks well awake and alert in the PACU. Foley in place with urine running light pink on a slow CBI.  Status post aqua ablation- -Will plan to keep in observation overnight. -Wean CBI off in the a.m. and discharge home with Foley catheter. -He has voiding trial arranged on the morning of August 26 -I sent nitrofurantoin to his pharmacy for him to take nightly.

## 2022-10-22 NOTE — Progress Notes (Signed)
Pt daughter called PACU dept. this morning. She is concerned that her father is being discharged after surgery today. I advised that I do not see an admission order at this time but they are usually placed after surgery. Pt daughter is very understanding. Pt daughter states that it was her understanding from preop appts that her father would be admitted and she feels this would be best for his safety with other comorbidities. I advised that I will follow up with her when he is out of surgery. Daughter very Adult nurse.

## 2022-10-22 NOTE — Care Management Obs Status (Signed)
MEDICARE OBSERVATION STATUS NOTIFICATION   Patient Details  Name: Matthew Knight MRN: [id number] Date of Birth: 11/11/1940   Medicare Observation Status Notification Given:  Yes    MahabirOlegario Messier, RN 10/22/2022, 3:26 PM

## 2022-10-22 NOTE — Interval H&P Note (Signed)
History and Physical Interval Note:  10/22/2022 7:19 AM  Matthew Knight  has presented today for surgery, with the diagnosis of BENIGN PROSTATE HYPERPLASIA RETENTION.  The various methods of treatment have been discussed with the patient and family. After consideration of risks, benefits and other options for treatment, the patient has consented to  Procedure(s) with comments: TRANSURETHRAL WATERJET ABLATION OF PROSTATE (N/A) - 90 MINS FOR CASE as a surgical intervention.  The patient's history has been reviewed, patient examined, no change in status, stable for surgery.  I have reviewed the patient's chart and labs.  Questions were answered to the patient's satisfaction.  He is well. With his son. No bladder pain or fever. Took cephalexin for a + staph urine cx. I added vanc to his abx.    Jerilee Field

## 2022-10-22 NOTE — Transfer of Care (Signed)
Immediate Anesthesia Transfer of Care Note  Patient: Matthew Knight  Procedure(s) Performed: TRANSURETHRAL WATERJET ABLATION OF PROSTATE  Patient Location: PACU  Anesthesia Type:General  Level of Consciousness: awake  Airway & Oxygen Therapy: Patient Spontanous Breathing and Patient connected to face mask oxygen  Post-op Assessment: Report given to RN and Post -op Vital signs reviewed and stable  Post vital signs: Reviewed and stable  Last Vitals:  Vitals Value Taken Time  BP    Temp    Pulse    Resp    SpO2      Last Pain:  Vitals:   10/22/22 0603  TempSrc: Oral  PainSc:          Complications: No notable events documented.

## 2022-10-23 DIAGNOSIS — N401 Enlarged prostate with lower urinary tract symptoms: Secondary | ICD-10-CM | POA: Diagnosis not present

## 2022-10-23 NOTE — Plan of Care (Signed)
  Problem: Nutrition: Goal: Adequate nutrition will be maintained Outcome: Progressing   

## 2022-10-23 NOTE — Plan of Care (Signed)
  Problem: Education: Goal: Knowledge of General Education information will improve Description: Including pain rating scale, medication(s)/side effects and non-pharmacologic comfort measures Outcome: Adequate for Discharge   Problem: Health Behavior/Discharge Planning: Goal: Ability to manage health-related needs will improve Outcome: Adequate for Discharge   Problem: Clinical Measurements: Goal: Ability to maintain clinical measurements within normal limits will improve Outcome: Adequate for Discharge Goal: Will remain free from infection Outcome: Adequate for Discharge Goal: Diagnostic test results will improve Outcome: Adequate for Discharge Goal: Respiratory complications will improve Outcome: Adequate for Discharge Goal: Cardiovascular complication will be avoided Outcome: Adequate for Discharge   Problem: Activity: Goal: Risk for activity intolerance will decrease Outcome: Adequate for Discharge   Problem: Nutrition: Goal: Adequate nutrition will be maintained 10/23/2022 1328 by Epimenio Foot, RN Outcome: Adequate for Discharge 10/23/2022 1003 by Epimenio Foot, RN Outcome: Progressing   Problem: Coping: Goal: Level of anxiety will decrease Outcome: Adequate for Discharge   Problem: Elimination: Goal: Will not experience complications related to bowel motility Outcome: Adequate for Discharge Goal: Will not experience complications related to urinary retention Outcome: Adequate for Discharge   Problem: Pain Managment: Goal: General experience of comfort will improve Outcome: Adequate for Discharge   Problem: Safety: Goal: Ability to remain free from injury will improve Outcome: Adequate for Discharge   Problem: Skin Integrity: Goal: Risk for impaired skin integrity will decrease Outcome: Adequate for Discharge   Problem: Education: Goal: Knowledge of the prescribed therapeutic regimen will improve Outcome: Adequate for Discharge   Problem:  Bowel/Gastric: Goal: Gastrointestinal status for postoperative course will improve Outcome: Adequate for Discharge   Problem: Health Behavior/Discharge Planning: Goal: Identification of resources available to assist in meeting health care needs will improve Outcome: Adequate for Discharge   Problem: Skin Integrity: Goal: Demonstration of wound healing without infection will improve Outcome: Adequate for Discharge   Problem: Urinary Elimination: Goal: Ability to avoid or minimize complications of infection will improve Outcome: Adequate for Discharge

## 2022-10-23 NOTE — Discharge Summary (Signed)
Physician Discharge Summary  Patient ID: Matthew Knight MRN: [id number] DOB/AGE: 1940-10-03 82 y.o.  Admit date: 10/22/2022 Discharge date: 10/23/2022  Admission Diagnoses:  BPH with obstruction/lower urinary tract symptoms  Discharge Diagnoses:  Principal Problem:   BPH with obstruction/lower urinary tract symptoms   Past Medical History:  Diagnosis Date   Acute blood loss anemia    Anxiety    Arthritis    Dementia (HCC)    Diabetes mellitus without complication (HCC)    Fall at home, initial encounter 03/19/2021   GERD (gastroesophageal reflux disease)    Hearing loss    Hepatitis    Hypertension    Pneumonia    Screening for colon cancer 12/03/2013   Small vessel disease (HCC)    Stroke (HCC)     Surgeries: Procedure(s): TRANSURETHRAL WATERJET ABLATION OF PROSTATE on 10/22/2022   Consultants (if any):   Discharged Condition: Improved  Hospital Course: Matthew Knight is an 82 y.o. male who was admitted 10/22/2022 with a diagnosis of BPH with obstruction/lower urinary tract symptoms and went to the operating room on 10/22/2022 and underwent the above named procedures.  He is doing well this morning with clear urine on minimal CBI.  He will be discharged with the foley.    He was given perioperative antibiotics:  Anti-infectives (From admission, onward)    Start     Dose/Rate Route Frequency Ordered Stop   10/22/22 0645  vancomycin (VANCOCIN) IVPB 1000 mg/200 mL premix  Status:  Discontinued        1,000 mg 200 mL/hr over 60 Minutes Intravenous  Once 10/22/22 0609 10/22/22 0613   10/22/22 0615  vancomycin (VANCOCIN) IVPB 1000 mg/200 mL premix        1,000 mg 200 mL/hr over 60 Minutes Intravenous NOW 10/22/22 0613 10/22/22 0913   10/22/22 0551  cefTRIAXone (ROCEPHIN) 2 g in sodium chloride 0.9 % 100 mL IVPB        2 g 200 mL/hr over 30 Minutes Intravenous 30 min pre-op 10/22/22 0551 10/22/22 0803   10/22/22 0000  nitrofurantoin, macrocrystal-monohydrate,  (MACROBID) 100 MG capsule        100 mg Oral Daily at bedtime 10/22/22 1001 11/05/22 2359     .  He was given sequential compression devices, for DVT prophylaxis.  He benefited maximally from the hospital stay and there were no complications.    Recent vital signs:  Vitals:   10/23/22 0256 10/23/22 0651  BP: (!) 147/63 (!) 149/67  Pulse: 68 75  Resp: 15 14  Temp: 98.8 F (37.1 C) 98.3 F (36.8 C)  SpO2: 97% 94%    Recent laboratory studies:  Lab Results  Component Value Date   HGB 13.1 10/11/2022   HGB 12.7 (L) 09/18/2022   HGB 13.6 08/01/2022   Lab Results  Component Value Date   WBC 8.9 10/11/2022   PLT 317 10/11/2022   Lab Results  Component Value Date   INR 1.1 08/31/2021   Lab Results  Component Value Date   NA 139 10/11/2022   K 4.0 10/11/2022   CL 106 10/11/2022   CO2 22 10/11/2022   BUN 16 10/11/2022   CREATININE 1.05 10/11/2022   GLUCOSE 96 10/11/2022    Discharge Medications:   Allergies as of 10/23/2022       Reactions   Aspirin Other (See Comments)   "BLEEDS OUT INTERNALLY"   Nsaids Other (See Comments)   NO ANTI-COAGULANTS, EITHER!!!! "BLEEDS OUT INTERNALLY" (ONLY Tylenol is tolerated)  Darunavir Other (See Comments)   Eruption of the skin   Rivastigmine Other (See Comments)   Brand name only for the patch- "otherwise, he will have a violent reaction"   Heparin Other (See Comments)   Pt requests NO ANTICOAGULANTS.  Has varices with hx of bleeding.   Sulfa Antibiotics Other (See Comments)   States this "made him crazy"        Medication List     STOP taking these medications    cephALEXin 500 MG capsule Commonly known as: KEFLEX       TAKE these medications    acetaminophen 325 MG tablet Commonly known as: TYLENOL Take 2 tablets (650 mg total) by mouth every 4 (four) hours as needed for mild pain, moderate pain, fever or headache.   docusate sodium 100 MG capsule Commonly known as: COLACE Take 100 mg by mouth every  other day.   escitalopram 20 MG tablet Commonly known as: LEXAPRO Take 20 mg by mouth daily.   finasteride 5 MG tablet Commonly known as: PROSCAR Take 1 tablet (5 mg total) by mouth daily.   latanoprost 0.005 % ophthalmic solution Commonly known as: XALATAN Place 1 drop into both eyes at bedtime.   lisinopril 10 MG tablet Commonly known as: ZESTRIL Take 10 mg by mouth daily.   nitrofurantoin (macrocrystal-monohydrate) 100 MG capsule Commonly known as: Macrobid Take 1 capsule (100 mg total) by mouth at bedtime for 14 days.   pantoprazole 40 MG tablet Commonly known as: PROTONIX Take 1 tablet (40 mg total) by mouth daily. What changed: when to take this   PNV FOLIC ACID + IRON PO Take 1 tablet by mouth daily with breakfast.   polyethylene glycol 17 g packet Commonly known as: MiraLax Take 17 g by mouth daily as needed for moderate constipation.   pravastatin 20 MG tablet Commonly known as: PRAVACHOL Take 20 mg by mouth daily.   Rhopressa 0.02 % Soln Generic drug: Netarsudil Dimesylate Place 1 drop into both eyes daily.   silodosin 8 MG Caps capsule Commonly known as: RAPAFLO Take 8 mg by mouth daily.   Vitamin D3 1000 units Caps Take 1,000 Units by mouth daily.        Diagnostic Studies: No results found.  Disposition: Discharge disposition: 01-Home or Self Care       Discharge Instructions     Discontinue IV   Complete by: As directed    Urinary leg bag   Complete by: As directed         Follow-up Information     Jerilee Field, MD Follow up on 11/01/2022.   Specialty: Urology Why: at 9:45 AM for voiding trial with NP [facility] information: [address] [place] [address] [phone]                  Signed: Bjorn Pippin 10/23/2022, 9:59 AM

## 2022-10-23 NOTE — TOC Transition Note (Signed)
Transition of Care [facility]) - CM/SW Discharge Note   Patient Details  Name: Matthew Knight MRN: [id number] Date of Birth: 18-Nov-1940  [facility] CM/SW Contact:  Lanier Clam, RN Phone Number: 10/23/2022, 11:56 AM   Clinical Narrative: d/c home no orders or needs.      Final next level of care: Home/Self Care Barriers to Discharge: No Barriers Identified   Patient Goals and CMS Choice CMS Medicare.gov Compare Post Acute Care list provided to:: Patient Represenative (must comment) Choice offered to / list presented to : Adult Children  Discharge Placement                         Discharge Plan and Services Additional resources added to the After Visit Summary for     Discharge Planning Services: CM Consult Post Acute Care Choice: Resumption of Svcs/PTA Provider                               Social Determinants of Health (SDOH) Interventions SDOH Screenings   Food Insecurity: No Food Insecurity (10/22/2022)  Housing: Low Risk  (10/22/2022)  Transportation Needs: No Transportation Needs (10/22/2022)  Utilities: Not At Risk (10/22/2022)  Financial Resource Strain: Low Risk  (03/29/2022)   Received [facility], [facility]  Physical Activity: Insufficiently Active (09/21/2021)   Received from [facility], [facility]  Social Connections: Unknown (09/24/2022)   Received from [facility]  Stress: No Stress Concern Present (09/21/2021)   Received from [facility], [facility]  Tobacco Use: Medium Risk (10/22/2022)     Readmission Risk Interventions     No data to display

## 2022-10-23 NOTE — Progress Notes (Signed)
Patient and his family given discharge, medication, foley catheter care and follow up instructions, verbalized understanding,  IV removed, personal belongings with patient, family to transport home

## 2022-10-25 LAB — SURGICAL PATHOLOGY

## 2022-11-10 ENCOUNTER — Ambulatory Visit: Payer: Medicare Other | Admitting: Cardiology

## 2024-03-24 IMAGING — CT CT CERVICAL SPINE W/O CM
4 series · 16 of 33 positions shown, 18 images · non-contrast
Comparison: 03/19/2021

CLINICAL DATA: Un witnessed fall with headaches and neck pain,
initial encounter



[Series 4: orthogonal axials · axial · 0.24mm/px · z∈[+983,+1081]mm · 4 of 93 slices shown, 5 images]
[im 19/93  soft-tissue]
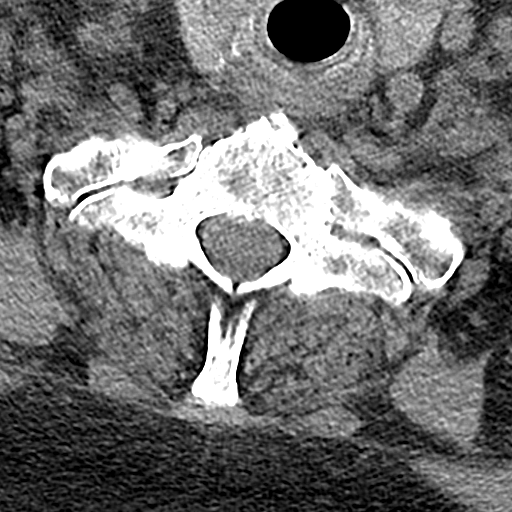
[im 19/93  bone]
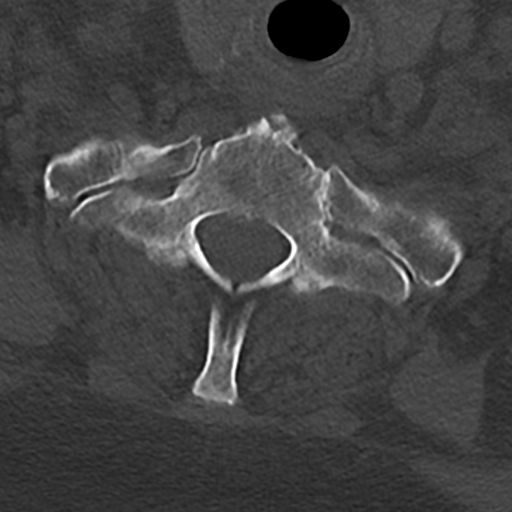
[im 37/93  bone]
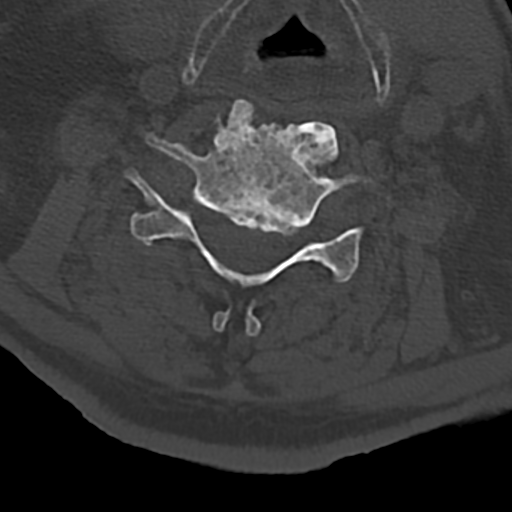
[im 56/93  bone]
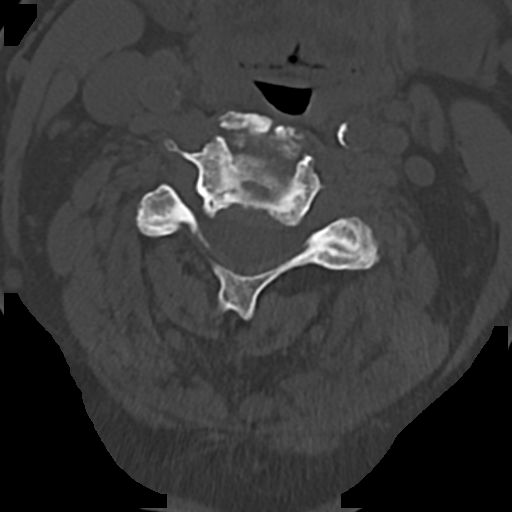
[im 74/93  bone]
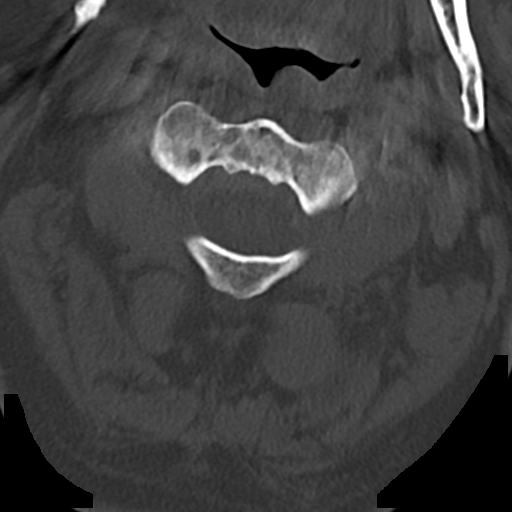

[Series 6: c spine soft · axial · 0.38mm/px · z∈[+990,+1090]mm · 4 of 114 slices shown]
[im 17/114  soft-tissue]
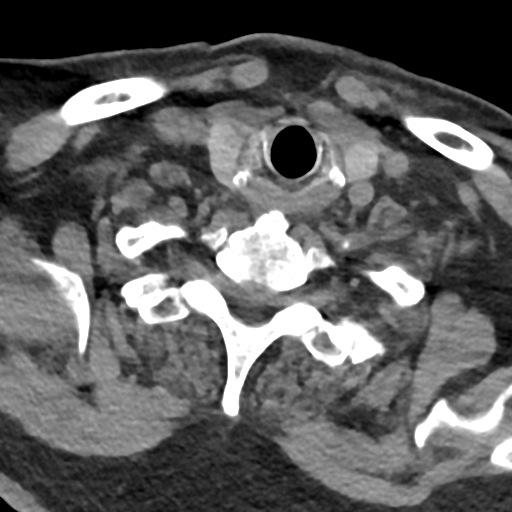
[im 33/114  soft-tissue]
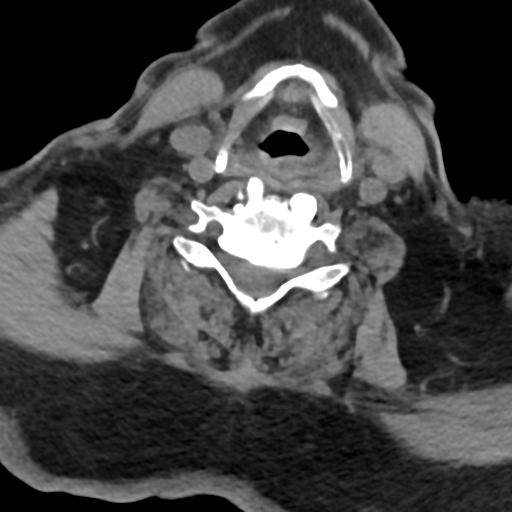
[im 49/114  soft-tissue]
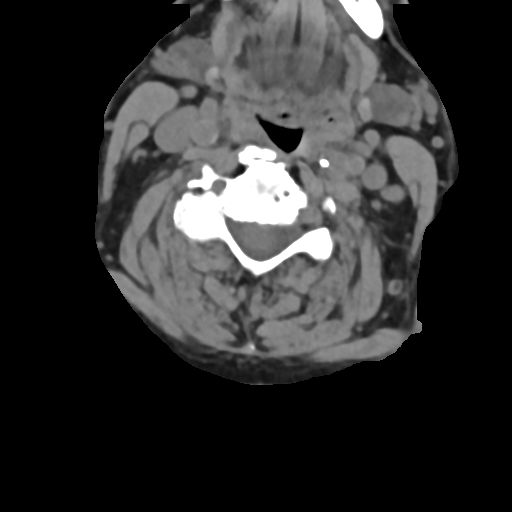
[im 65/114  soft-tissue]
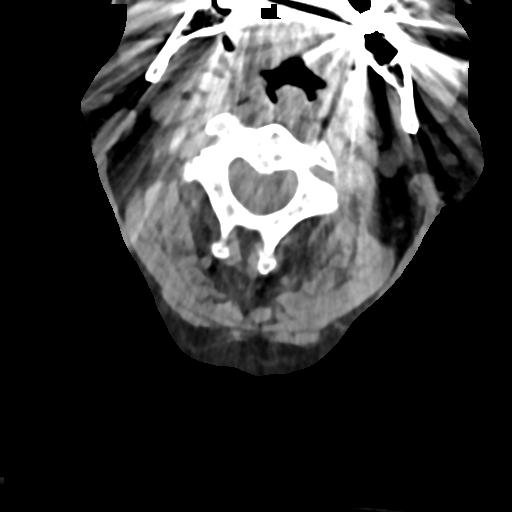

[Series 9: sag bone · sagittal · 0.43mm/px · 5 of 51 slices shown, 6 images]
[im 17/51  bone]
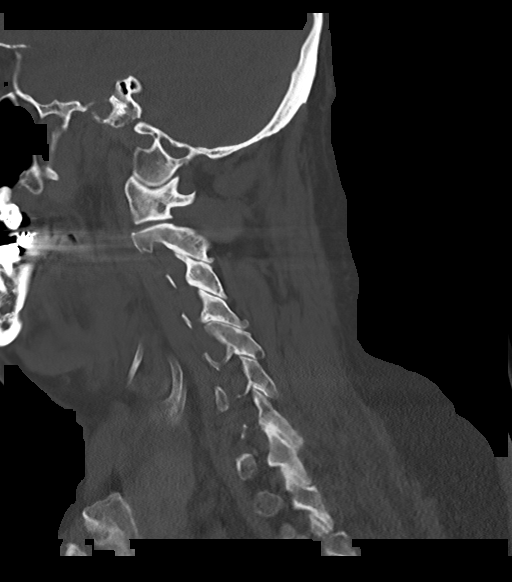
[im 21/51  bone]
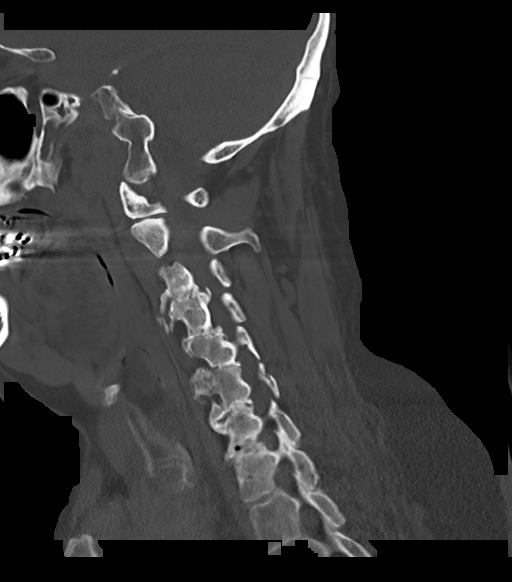
[im 26/51  soft-tissue]
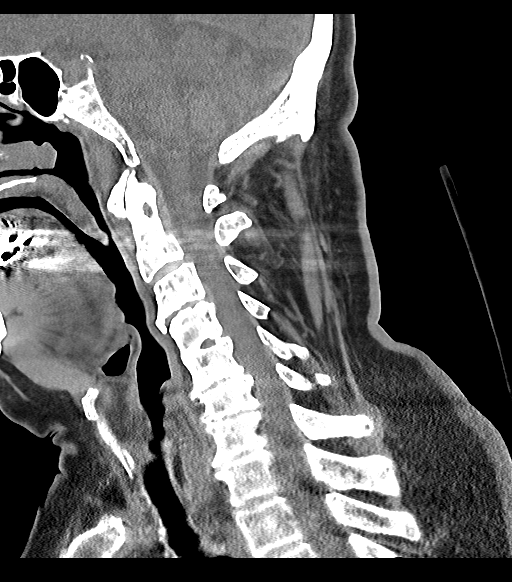
[im 26/51  bone]
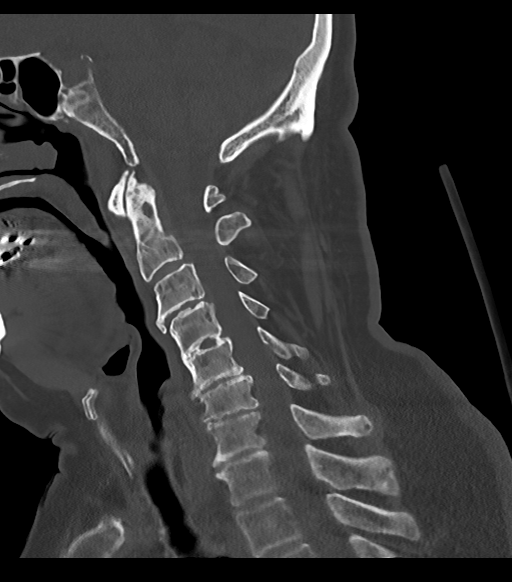
[im 30/51  bone]
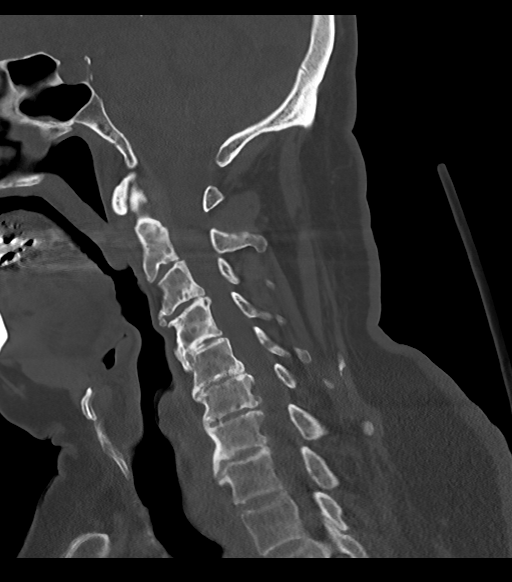
[im 34/51  bone]
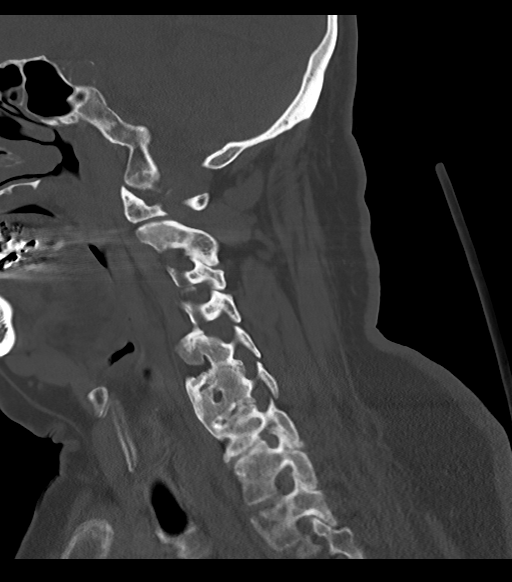

[Series 10: cor bone · coronal · 0.50mm/px · 3 of 56 slices shown]
[im 12/56  bone]
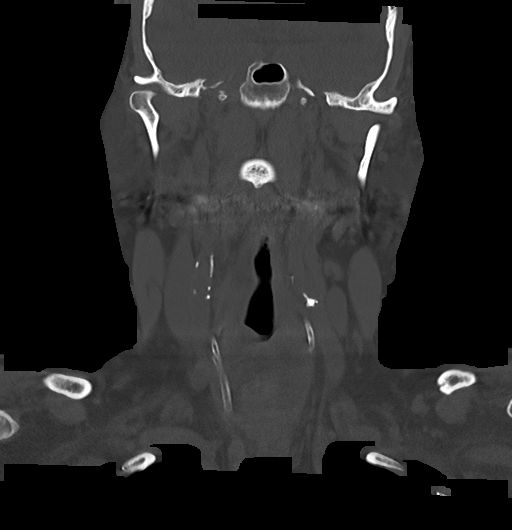
[im 23/56  bone]
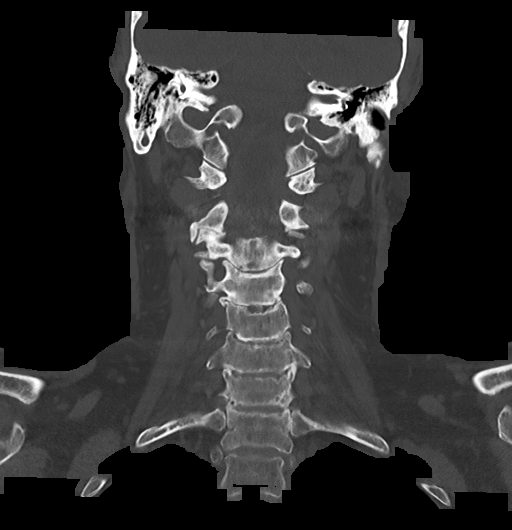
[im 34/56  bone]
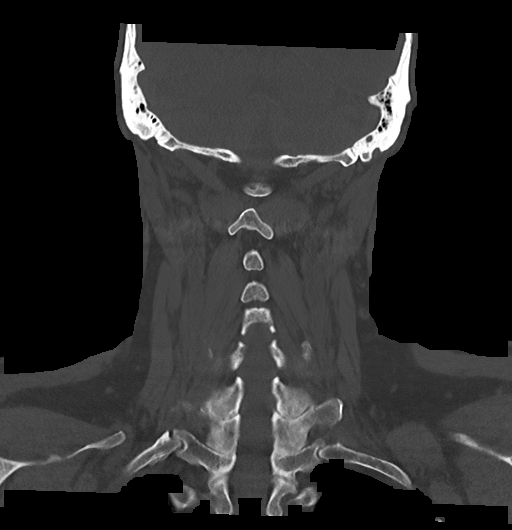

[16 of 33 positions shown; findings below may reference images not displayed]

FINDINGS: CT HEAD FINDINGS

Brain: No evidence of acute infarction, hemorrhage, hydrocephalus,
extra-axial collection or mass lesion/mass effect. Mild atrophic
changes and chronic white matter ischemic changes are noted similar
to that seen on prior exam.

Vascular: No hyperdense vessel or unexpected calcification.

Skull: Normal. Negative for fracture or focal lesion.

Sinuses/Orbits: No acute finding.

Other: None.

CT CERVICAL SPINE FINDINGS

Alignment: Stable loss of the normal cervical lordosis is seen. Mild
anterolisthesis of C4 on C5 and C5 on C6 is noted.

Skull base and vertebrae: Multilevel osteophytic changes are seen.
No acute fracture or acute facet abnormality is noted. Multilevel
facet hypertrophic changes are seen. The odontoid is within normal
limits.

Soft tissues and spinal canal: Surrounding soft tissue structures
demonstrate bilateral thyroid hypodensities better visualized than
on the prior exam. The largest of these lies on the left measuring
up to 19 mm.

Upper chest: Visualized lung apices are within normal limits.

Other: None
IMPRESSION: CT of the head: Chronic atrophic and ischemic changes without acute
abnormality.

CT of the cervical spine: Multilevel degenerative change without
acute abnormality.

Bilateral hypodense nodules within the thyroid measuring up to 19
mm. Recommend nonemergent thyroid US (ref: [HOSPITAL]. [id number]

## 2024-03-25 IMAGING — DX DG CHEST 1V PORT
1 series · 1 of 1 positions shown · non-contrast
Comparison: None.

CLINICAL DATA: Unwitnessed fall.

EXAM:
PORTABLE CHEST 1 VIEW

[chest]
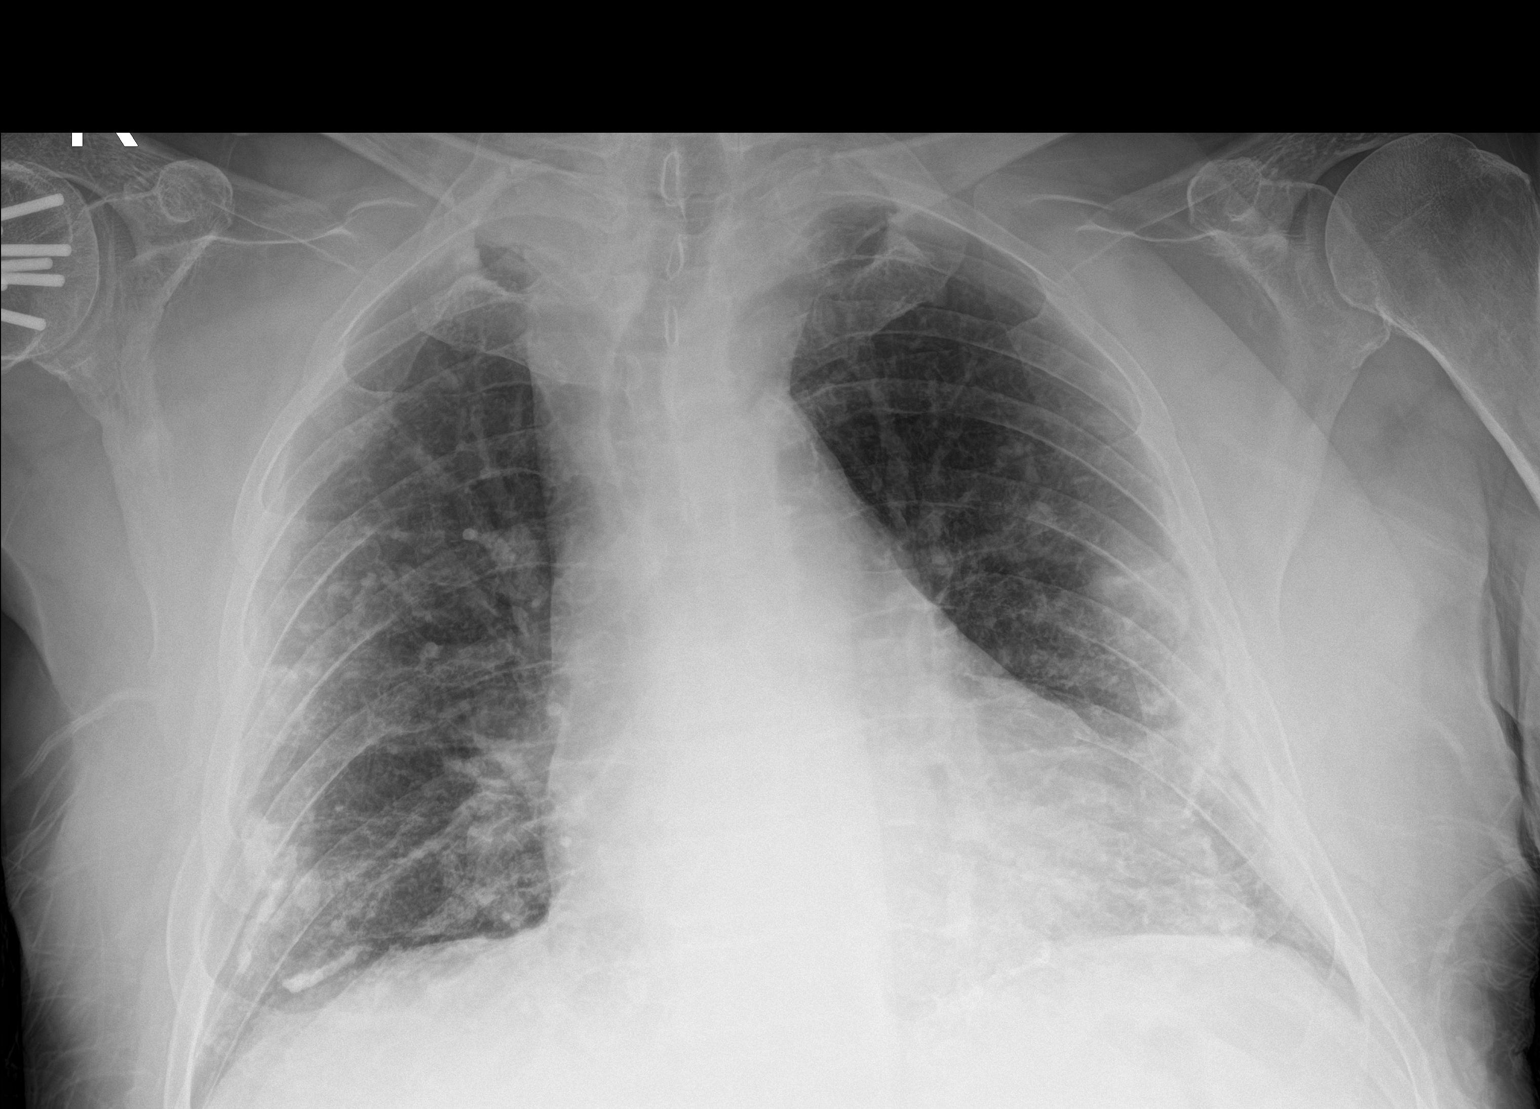

[1 of 1 positions shown; findings below may reference images not displayed]

FINDINGS: The heart size and mediastinal contours are stable. Atherosclerotic
calcification of the aorta is noted. No consolidation, effusion, or
pneumothorax. Stable calcified pleural plaques are noted
bilaterally. Right shoulder fixation hardware is noted.
IMPRESSION: Stable chest with no acute process.
